# Patient Record
Sex: Female | Born: 1965
Health system: Southern US, Community
[De-identification: ages and names within clinical notes are randomized; demographics above are authoritative.]

## PROBLEM LIST (undated history)

## (undated) DIAGNOSIS — I739 Peripheral vascular disease, unspecified: Secondary | ICD-10-CM

## (undated) DIAGNOSIS — E7800 Pure hypercholesterolemia, unspecified: Secondary | ICD-10-CM

## (undated) DIAGNOSIS — C439 Malignant melanoma of skin, unspecified: Secondary | ICD-10-CM

## (undated) DIAGNOSIS — K219 Gastro-esophageal reflux disease without esophagitis: Secondary | ICD-10-CM

## (undated) DIAGNOSIS — Z9889 Other specified postprocedural states: Secondary | ICD-10-CM

## (undated) DIAGNOSIS — I779 Disorder of arteries and arterioles, unspecified: Secondary | ICD-10-CM

## (undated) DIAGNOSIS — T8859XA Other complications of anesthesia, initial encounter: Secondary | ICD-10-CM

## (undated) DIAGNOSIS — F419 Anxiety disorder, unspecified: Secondary | ICD-10-CM

## (undated) DIAGNOSIS — G43909 Migraine, unspecified, not intractable, without status migrainosus: Secondary | ICD-10-CM

## (undated) DIAGNOSIS — M1990 Unspecified osteoarthritis, unspecified site: Secondary | ICD-10-CM

## (undated) DIAGNOSIS — M797 Fibromyalgia: Secondary | ICD-10-CM

## (undated) DIAGNOSIS — K8020 Calculus of gallbladder without cholecystitis without obstruction: Secondary | ICD-10-CM

## (undated) DIAGNOSIS — R112 Nausea with vomiting, unspecified: Secondary | ICD-10-CM

## (undated) DIAGNOSIS — C229 Malignant neoplasm of liver, not specified as primary or secondary: Secondary | ICD-10-CM

## (undated) DIAGNOSIS — T4145XA Adverse effect of unspecified anesthetic, initial encounter: Secondary | ICD-10-CM

## (undated) DIAGNOSIS — Z720 Tobacco use: Secondary | ICD-10-CM

## (undated) DIAGNOSIS — Z9289 Personal history of other medical treatment: Secondary | ICD-10-CM

## (undated) HISTORY — DX: Migraine, unspecified, not intractable, without status migrainosus: G43.909

## (undated) HISTORY — PX: ABDOMINAL HYSTERECTOMY: SHX81

## (undated) HISTORY — DX: Anxiety disorder, unspecified: F41.9

## (undated) HISTORY — DX: Tobacco use: Z72.0

## (undated) HISTORY — DX: Pure hypercholesterolemia, unspecified: E78.00

## (undated) HISTORY — DX: Fibromyalgia: M79.7

## (undated) HISTORY — DX: Disorder of arteries and arterioles, unspecified: I77.9

## (undated) HISTORY — DX: Peripheral vascular disease, unspecified: I73.9

## (undated) HISTORY — DX: Malignant melanoma of skin, unspecified: C43.9

## (undated) HISTORY — PX: BONE GRAFT HIP ILIAC CREST: SUR159

---

## 1992-01-27 HISTORY — PX: WRIST SURGERY: SHX841

## 1997-11-15 ENCOUNTER — Other Ambulatory Visit: Admission: RE | Admit: 1997-11-15 | Discharge: 1997-11-15 | Payer: Self-pay | Admitting: Obstetrics and Gynecology

## 1999-03-18 ENCOUNTER — Other Ambulatory Visit: Admission: RE | Admit: 1999-03-18 | Discharge: 1999-03-18 | Payer: Self-pay | Admitting: Obstetrics and Gynecology

## 1999-05-22 ENCOUNTER — Ambulatory Visit (HOSPITAL_COMMUNITY): Admission: RE | Admit: 1999-05-22 | Discharge: 1999-05-22 | Payer: Self-pay | Admitting: Obstetrics and Gynecology

## 2001-05-27 ENCOUNTER — Other Ambulatory Visit: Admission: RE | Admit: 2001-05-27 | Discharge: 2001-05-27 | Payer: Self-pay | Admitting: Obstetrics and Gynecology

## 2005-02-05 ENCOUNTER — Ambulatory Visit: Payer: Self-pay | Admitting: Pain Medicine

## 2005-03-26 ENCOUNTER — Ambulatory Visit: Payer: Self-pay | Admitting: Pain Medicine

## 2005-04-15 ENCOUNTER — Ambulatory Visit: Payer: Self-pay | Admitting: Physician Assistant

## 2005-04-30 ENCOUNTER — Ambulatory Visit: Payer: Self-pay | Admitting: Physician Assistant

## 2005-07-06 ENCOUNTER — Ambulatory Visit: Payer: Self-pay | Admitting: Physician Assistant

## 2005-07-23 ENCOUNTER — Ambulatory Visit: Payer: Self-pay | Admitting: Pain Medicine

## 2005-08-05 ENCOUNTER — Ambulatory Visit: Payer: Self-pay | Admitting: Pain Medicine

## 2006-01-26 HISTORY — PX: ANTERIOR FUSION CERVICAL SPINE: SUR626

## 2006-02-01 ENCOUNTER — Inpatient Hospital Stay: Payer: Self-pay | Admitting: Unknown Physician Specialty

## 2008-11-08 ENCOUNTER — Emergency Department (HOSPITAL_COMMUNITY): Admission: EM | Admit: 2008-11-08 | Discharge: 2008-11-08 | Payer: Self-pay | Admitting: Family Medicine

## 2009-10-28 ENCOUNTER — Inpatient Hospital Stay (HOSPITAL_COMMUNITY): Admission: EM | Admit: 2009-10-28 | Discharge: 2009-10-29 | Payer: Self-pay | Admitting: Neurosurgery

## 2009-10-28 ENCOUNTER — Encounter: Payer: Self-pay | Admitting: Emergency Medicine

## 2009-11-14 ENCOUNTER — Ambulatory Visit: Payer: Self-pay | Admitting: Internal Medicine

## 2009-11-14 DIAGNOSIS — M5030 Other cervical disc degeneration, unspecified cervical region: Secondary | ICD-10-CM

## 2009-11-14 DIAGNOSIS — G43909 Migraine, unspecified, not intractable, without status migrainosus: Secondary | ICD-10-CM | POA: Insufficient documentation

## 2009-11-23 ENCOUNTER — Telehealth (INDEPENDENT_AMBULATORY_CARE_PROVIDER_SITE_OTHER): Payer: Self-pay | Admitting: Internal Medicine

## 2009-11-26 ENCOUNTER — Telehealth (INDEPENDENT_AMBULATORY_CARE_PROVIDER_SITE_OTHER): Payer: Self-pay | Admitting: Internal Medicine

## 2009-11-28 ENCOUNTER — Emergency Department (HOSPITAL_COMMUNITY): Admission: EM | Admit: 2009-11-28 | Discharge: 2009-11-29 | Payer: Self-pay | Admitting: Emergency Medicine

## 2009-12-04 ENCOUNTER — Telehealth (INDEPENDENT_AMBULATORY_CARE_PROVIDER_SITE_OTHER): Payer: Self-pay | Admitting: Internal Medicine

## 2009-12-11 ENCOUNTER — Encounter (INDEPENDENT_AMBULATORY_CARE_PROVIDER_SITE_OTHER): Payer: Self-pay | Admitting: Internal Medicine

## 2009-12-13 ENCOUNTER — Encounter (INDEPENDENT_AMBULATORY_CARE_PROVIDER_SITE_OTHER): Payer: Self-pay | Admitting: Internal Medicine

## 2009-12-25 ENCOUNTER — Telehealth (INDEPENDENT_AMBULATORY_CARE_PROVIDER_SITE_OTHER): Payer: Self-pay | Admitting: Internal Medicine

## 2010-01-07 ENCOUNTER — Ambulatory Visit: Payer: Self-pay | Admitting: Internal Medicine

## 2010-01-07 DIAGNOSIS — M7660 Achilles tendinitis, unspecified leg: Secondary | ICD-10-CM

## 2010-01-07 DIAGNOSIS — R300 Dysuria: Secondary | ICD-10-CM | POA: Insufficient documentation

## 2010-01-07 LAB — CONVERTED CEMR LAB
Glucose, Urine, Semiquant: NEGATIVE
Protein, U semiquant: NEGATIVE
Urobilinogen, UA: 0.2

## 2010-01-08 ENCOUNTER — Encounter (INDEPENDENT_AMBULATORY_CARE_PROVIDER_SITE_OTHER): Payer: Self-pay | Admitting: Internal Medicine

## 2010-01-08 ENCOUNTER — Telehealth (INDEPENDENT_AMBULATORY_CARE_PROVIDER_SITE_OTHER): Payer: Self-pay | Admitting: Internal Medicine

## 2010-01-14 ENCOUNTER — Ambulatory Visit: Payer: Self-pay | Admitting: Internal Medicine

## 2010-02-12 ENCOUNTER — Encounter
Admission: RE | Admit: 2010-02-12 | Discharge: 2010-02-25 | Payer: Self-pay | Source: Home / Self Care | Attending: Internal Medicine | Admitting: Internal Medicine

## 2010-02-13 ENCOUNTER — Encounter (INDEPENDENT_AMBULATORY_CARE_PROVIDER_SITE_OTHER): Payer: Self-pay | Admitting: Internal Medicine

## 2010-02-26 ENCOUNTER — Ambulatory Visit: Payer: Self-pay | Attending: Internal Medicine | Admitting: Physical Therapy

## 2010-02-26 DIAGNOSIS — M542 Cervicalgia: Secondary | ICD-10-CM | POA: Insufficient documentation

## 2010-02-26 DIAGNOSIS — M2569 Stiffness of other specified joint, not elsewhere classified: Secondary | ICD-10-CM | POA: Insufficient documentation

## 2010-02-26 DIAGNOSIS — M25676 Stiffness of unspecified foot, not elsewhere classified: Secondary | ICD-10-CM | POA: Insufficient documentation

## 2010-02-26 DIAGNOSIS — M25673 Stiffness of unspecified ankle, not elsewhere classified: Secondary | ICD-10-CM | POA: Insufficient documentation

## 2010-02-26 DIAGNOSIS — IMO0001 Reserved for inherently not codable concepts without codable children: Secondary | ICD-10-CM | POA: Insufficient documentation

## 2010-02-26 DIAGNOSIS — M25579 Pain in unspecified ankle and joints of unspecified foot: Secondary | ICD-10-CM | POA: Insufficient documentation

## 2010-02-27 NOTE — Progress Notes (Signed)
Summary: MED REFILL  Phone Note Refill Request Call back at [phone]   Refills Requested: Medication #1:  PLAVIX 75 MG TABS 1 tab by mouth daily with meal. PLEASE CALL PATIENT HAVE A QUESTION ABOUT WHERE TO REFILLT THE MEDICINE. AT THE HEALTH SERVE OR BY MAIL. SHE HAS APP ON 12.01.11 AT 12:00 WITH DR Sanari Offner.  Initial call taken by: Domenic Polite,  November 26, 2009 11:57 AM  Follow-up for Phone Call        Left message with female for patient to return call. Gaylyn Cheers RN  November 28, 2009 9:15 AM  It was sent to [facility] pharmacy- pt. notified.  Dutch Quint RN  November 29, 2009 3:55 PM

## 2010-02-27 NOTE — Miscellaneous (Signed)
Summary: Rehab Report//INITIAL SUMMARY  Rehab Report//INITIAL SUMMARY   Imported By: Arta Bruce 02/14/2010 16:55:54  _____________________________________________________________________  External Attachment:    Type:   Image     Comment:   External Document

## 2010-02-27 NOTE — Progress Notes (Signed)
Summary: Rescheduled Appt***  Phone Note Outgoing Call   Call placed by: Hassell Halim CMA,  December 25, 2009 5:17 PM Call placed to: Patient Action Taken: Appt scheduled Summary of Call: Called pt to reschedule appt with Dr Delrae Alfred..patient states she was coming in to follow up with meds and that she needs a refill on her tramadol and promethezine. Advise will forward to provider to review chart and make a decision on the refill. New appt is 01/07/2010 Initial call taken by: Hassell Halim CMA,  December 25, 2009 5:18 PM  Follow-up for Phone Call        OK to fill both for a month--please call Tramadol to [facility].  I have sent the promethazine. Follow-up by: Julieanne Manson MD,  December 26, 2009 1:42 PM  Additional Follow-up for Phone Call Additional follow up Details #1::        called rx into pharmacy and pt is aware of med being at pharmacy Additional Follow-up by: Armenia Shannon,  December 26, 2009 2:56 PM    Prescriptions: TRAMADOL HCL 50 MG TABS (TRAMADOL HCL) 2 tabs by mouth every 6 hours as needed  #30 x 0   Entered and Authorized by:   Julieanne Manson MD   Signed by:   Julieanne Manson MD on 12/26/2009   Method used:   Telephoned to ...       [facility] (retail)       [address] [place], [place]  [address]       Ph: [phone]       Fax: [phone]   RxID:   [id number] PROMETHAZINE HCL 25 MG TABS (PROMETHAZINE HCL) 1/2 to 1 tab by mouth every 6 hours prn  #30 x 0   Entered and Authorized by:   Julieanne Manson MD   Signed by:   Julieanne Manson MD on 12/26/2009   Method used:   Faxed to ...       [facility] - [facility] (retail)       [address] [place], [place]  [address]       Ph: [phone]       Fax: [phone]   RxID:   [id number]

## 2010-02-27 NOTE — Miscellaneous (Signed)
Summary: NS at [facility]--med change  Clinical Lists Changes  Medications: Added new medication of NORTRIPTYLINE HCL 25 MG CAPS (NORTRIPTYLINE HCL) 1 tab by mouth at bedtime--NS at [facility]  Dr. Gaynelle Adu

## 2010-02-27 NOTE — Letter (Signed)
[facility]   Imported By: Arta Bruce 12/17/2009 09:50:41  _____________________________________________________________________  External Attachment:    Type:   Image     Comment:   External Document

## 2010-02-27 NOTE — Letter (Signed)
Summary: PT INFORMATION SHEET  PT INFORMATION SHEET   Imported By: Arta Bruce 11/14/2009 14:21:08  _____________________________________________________________________  External Attachment:    Type:   Image     Comment:   External Document

## 2010-02-27 NOTE — Progress Notes (Signed)
Summary: meds refill  Phone Note Refill Request Call back at [phone]   Refills Requested: Medication #1:  VERAPAMIL HCL CR 120 MG XR24H-CAP 1 cap by mouth daily. they only give her 3 pills..patient wants to change pharmacy to [facility] phone [phone] [address]  Initial call taken by: Domenic Polite,  January 08, 2010 11:51 AM  Follow-up for Phone Call        Left message on answering machine for pt. to return call.  Dutch Quint RN  January 08, 2010 2:37 PM  Pt. states only received 3 pills with no explanation.  I called pharmacy -- they only had 3 to give her until new supply comes in tomorrow.  Advised pt. -- she will keep Rx at [facility] and call them tomorrow to check for refill availability. Follow-up by: Dutch Quint RN,  January 08, 2010 4:56 PM

## 2010-02-27 NOTE — Progress Notes (Signed)
Summary: Re neuro referral  Phone Note Call from Patient   Caller: Patient Summary of Call: Pt. called here, requesting to go to [facility] Neurology unable to afford to go back to Dr. Pearlean Brownie. Initial call taken by: Gaylyn Cheers RN,  December 04, 2009 9:04 AM  Follow-up for Phone Call        Nora--please redirect Neuro referral to [facility] to have them determine what her follow up should be.   Please also include D/C summary  and Brain images from EChart--if you need me to copy and give to you tomorrow, let me know.  Send my recent note as well. Follow-up by: Julieanne Manson MD,  December 04, 2009 1:07 PM  Additional Follow-up for Phone Call Additional follow up Details #1::        I send the referral to [facility] Neurology they going to review it and let me know . I print the MR Brain from another computer because my computer don't print from e-chart  Additional Follow-up by: Cheryll Dessert,  December 04, 2009 1:34 PM

## 2010-02-27 NOTE — Progress Notes (Signed)
Summary: neurology referral  Phone Note Outgoing Call   Summary of Call: Laura [name]--referral to Dr. Jolayne Panther up of hospitalization for left vertebral artery dissection. Initial call taken by: Julieanne Manson MD,  November 23, 2009 9:34 AM  Follow-up for Phone Call        I SEND THE REFERRAL TO [facility] WITH THE OV NOTES DR Pearlean Brownie WAITING FOR AN APPT  Follow-up by: Cheryll Dessert,  November 25, 2009 9:09 AM

## 2010-02-27 NOTE — Assessment & Plan Note (Signed)
Summary: BP and pulse check  Nurse Visit   Vital Signs:  Patient profile:   45 year old female Menstrual status:  hysterectomy Pulse rate:   100 / minute Pulse rhythm:   regular Resp:     16 per minute BP sitting:   104 / 80  (right arm) Cuff size:   regular  Vitals Entered By: Dutch Quint RN (January 14, 2010 3:13 PM)  Impression & Recommendations:  Problem # 1:  MIGRAINE HEADACHE (ICD-346.90) Blood pressure remains good To continue verapamil  Keep scheduled appointment with Mehgan Santmyer   Her updated medication list for this problem includes:    Tramadol Hcl 50 Mg Tabs (Tramadol hcl) .Marland Kitchen... 2 tabs by mouth every 6 hours as needed  Orders: Est. Patient Level I (81191)  Complete Medication List: 1)  Promethazine Hcl 25 Mg Tabs (Promethazine hcl) .... 1/2 to 1 tab by mouth every 6 hours prn 2)  Tramadol Hcl 50 Mg Tabs (Tramadol hcl) .... 2 tabs by mouth every 6 hours as needed 3)  Plavix 75 Mg Tabs (Clopidogrel bisulfate) .Marland Kitchen.. 1 tab by mouth daily with meal 4)  Verapamil Hcl Cr 120 Mg Xr24h-cap (Verapamil hcl) .Marland Kitchen.. 1 cap by mouth daily. 5)  Cipro 250 Mg Tabs (Ciprofloxacin hcl) .Marland Kitchen.. 1 tab by mouth two times a day for 3 days   Patient Instructions: 1)  Reviewed with Dr. Delrae Alfred 2)  Your blood pressure is fine -- no change. 3)  Continue with the verapamil.  It may take a while for you to notice a significant change. 4)  Keep scheduled appointment with Katrice Goel 5)  Call if anything changes or if you have any questions.   Physical Exam  General:  alert, well-developed, well-nourished, and well-hydrated.    CC: BP and pulse recheck  Is Patient Diabetic? No Pain Assessment Patient in pain? yes     Location: head Intensity: 6 Type: throbbing Onset of pain  Constant  Does patient need assistance? Functional Status Self care Ambulation Normal   Review of Systems CV:  Having trouble sleeping since starting medication.  States no change in headaches..   CC:   BP and pulse recheck .  History of Present Illness: Started on verapamil 120 mg. on 01/07/10.  States continues to have headaches, no change.  Continuing antibiotic.   Allergies: 1)  ! Aspirin (Aspirin)  Orders Added: 1)  Est. Patient Level I [47829]

## 2010-02-27 NOTE — Assessment & Plan Note (Signed)
Summary: F/U VISIT FOR MEDS//MC   Vital Signs:  Patient profile:   45 year old female Menstrual status:  hysterectomy Weight:      131.44 pounds BMI:     23.37 Temp:     98.1 degrees F oral Pulse rate:   108 / minute Pulse rhythm:   regular Resp:     14 per minute BP sitting:   102 / 80  (left arm) Cuff size:   regular  Vitals Entered By: Hale Drone CMA (January 07, 2010 3:00 PM) CC: 1 month f/u from 11/14/09. Has a knot on right foot near the heel.  Is Patient Diabetic? No Pain Assessment Patient in pain? no       Does patient need assistance? Functional Status Self care Ambulation Normal   CC:  1 month f/u from 11/14/09. Has a knot on right foot near the heel. Marland Kitchen  History of Present Illness: 1.  Vertebral Dissection:  Did see Neurology at [facility].  To stay on Plavix.  2.  Migraines:  was started on Nortriptyline to cover both prevention of migraines and insomnia.  She has not noted an iimprovement in either.  No side effects.  Pt. also with what sounds like a muscle spasm component with hx of cervical spine disease--chronic neck and shoulder pain  3.  Insomnia:  Nortriptyline without help regarding this also.  4.  Dysuria with hematuria in past 2 days.  No back pain or fever.  Drinking fluids okay.  No vomiting.  5.  Tender knot on posterior right heel.  No history of injury.  Does not wear any boots or shoes that rub up and down over this area.  Current Medications (verified): 1)  Promethazine Hcl 25 Mg Tabs (Promethazine Hcl) .... 1/2 To 1 Tab By Mouth Every 6 Hours Prn 2)  Tramadol Hcl 50 Mg Tabs (Tramadol Hcl) .... 2 Tabs By Mouth Every 6 Hours As Needed 3)  Plavix 75 Mg Tabs (Clopidogrel Bisulfate) .Marland Kitchen.. 1 Tab By Mouth Daily With Meal 4)  Nortriptyline Hcl 25 Mg Caps (Nortriptyline Hcl) .Marland Kitchen.. 1 Tab By Mouth At Bedtime--Ns At [facility]  Dr. Gaynelle Adu  Allergies (verified): 1)  ! Aspirin (Aspirin)  Physical Exam  General:  NAD Eyes:  PERRL, EOMI, discs  sharp Lungs:  Normal respiratory effort, chest expands symmetrically. Lungs are clear to auscultation, no crackles or wheezes. Heart:  Normal rate and regular rhythm. S1 and S2 normal without gallop, murmur, click, rub or other extra sounds. Extremities:  Tender swollen area without erythema at insertion of right achilles tendon to back of calcaneus.  Full ROM of ankle, but uncomfortable   Impression & Recommendations:  Problem # 1:  ACHILLES TENDINITIS (ICD-726.71)  Orders: Physical Therapy Referral (PT)  Problem # 2:  DYSURIA (ICD-788.1) Probable cystitis Her updated medication list for this problem includes:    Cipro 250 Mg Tabs (Ciprofloxacin hcl) .Marland Kitchen... 1 tab by mouth two times a day for 3 days  Orders: UA Dipstick w/o Micro (manual) (04540) T-Culture, Urine ([id number])  Problem # 3:  MIGRAINE HEADACHE (ICD-346.90) Start Verapamil for prophylaxis. Want to avoid meds that can cause vascular constriction with hx of vertebral artery dissection. Her updated medication list for this problem includes:    Tramadol Hcl 50 Mg Tabs (Tramadol hcl) .Marland Kitchen... 2 tabs by mouth every 6 hours as needed  Problem # 4:  Hx of DISC DISEASE, CERVICAL (ICD-722.4) No interest in PT for this currently--will see if can get PT to work with  her regarding this while in for achilles tendon  Complete Medication List: 1)  Promethazine Hcl 25 Mg Tabs (Promethazine hcl) .... 1/2 to 1 tab by mouth every 6 hours prn 2)  Tramadol Hcl 50 Mg Tabs (Tramadol hcl) .... 2 tabs by mouth every 6 hours as needed 3)  Plavix 75 Mg Tabs (Clopidogrel bisulfate) .Marland Kitchen.. 1 tab by mouth daily with meal 4)  Verapamil Hcl Cr 120 Mg Xr24h-cap (Verapamil hcl) .Marland Kitchen.. 1 cap by mouth daily. 5)  Cipro 250 Mg Tabs (Ciprofloxacin hcl) .Marland Kitchen.. 1 tab by mouth two times a day for 3 days  Patient Instructions: 1)  nurse visit for bp and pulse check in 1 week. 2)  Follow up with Dr. Delrae Alfred in 2 months --migraines Prescriptions: CIPRO 250 MG TABS  (CIPROFLOXACIN HCL) 1 tab by mouth two times a day for 3 days  #6 x 0   Entered and Authorized by:   Julieanne Manson MD   Signed by:   Julieanne Manson MD on 01/07/2010   Method used:   Electronically to        [facility] [address] (retail)       [phone]       [place], [place]  [address]       Ph: [phone]       Fax: [phone]   RxID:   [id number] VERAPAMIL HCL CR 120 MG XR24H-CAP (VERAPAMIL HCL) 1 cap by mouth daily.  #30 x 6   Entered and Authorized by:   Julieanne Manson MD   Signed by:   Julieanne Manson MD on 01/07/2010   Method used:   Electronically to        [facility] [address] (retail)       [phone]       [place], [place]  [address]       Ph: [phone]       Fax: [phone]   RxID:   [id number]    Orders Added: 1)  UA Dipstick w/o Micro (manual) [81002] 2)  Physical Therapy Referral [PT] 3)  T-Culture, Urine [[id number]] 4)  Est. Patient Level IV [20254]   Not Administered:    Influenza Vaccine not given due to: declined    Laboratory Results   Urine Tests  Date/Time Received: January 07, 2010 4:27 PM   Routine Urinalysis   Color: lt. yellow Glucose: negative   (Normal Range: Negative) Bilirubin: negative   (Normal Range: Negative) Ketone: negative   (Normal Range: Negative) Spec. Gravity: 1.010   (Normal Range: 1.003-1.035) Blood: negative   (Normal Range: Negative) pH: 5.5   (Normal Range: 5.0-8.0) Protein: negative   (Normal Range: Negative) Urobilinogen: 0.2   (Normal Range: 0-1) Nitrite: negative   (Normal Range: Negative) Leukocyte Esterace: small   (Normal Range: Negative)        Appended Document: F/U VISIT FOR MEDS//MC consider Neurontin for neck in future if no improvement.

## 2010-02-27 NOTE — Assessment & Plan Note (Signed)
Summary: NEW/IXFU//STROKE//KT   Vital Signs:  Patient profile:   45 year old female Menstrual status:  hysterectomy Height:      63 inches Weight:      133.2 pounds Temp:     97.4 degrees F Resp:     16 per minute BP sitting:   98 / 62  (left arm) Cuff size:   regular  Vitals Entered By: Michelle Nasuti (November 14, 2009 11:24 AM) CC: HFU stroke Is Patient Diabetic? No Pain Assessment Patient in pain? yes     Location: head Intensity: 6  Does patient need assistance? Functional Status Self care Ambulation Normal     Menstrual Status hysterectomy   CC:  HFU stroke.  History of Present Illness: 45 yo female here with history of  left vertebral artery dissection for which she was hospitalized overnight at [facility].  No cause for dissection found--possibility of fibromuscular dysplasia.  To follow up with Dr. Pearlean Brownie.  Still with headaches at occipital area and sometime radiates straight forward to left eye.  Sometimes with numbness and tingling into left fingers.  Symptoms not as bad as when hospitalized.  Had symptoms for 2 days before going into ED.  Continues on Plavix--has 10 days left.  Does feel bilateral vision goes blurry at times--not often.    PMH:  1.  Migraine Headaches:  started when very young.  Though pt. describes headache at nuchal area, then radiates up and over head.  Sometimes squeezing, becomes throbbing.  No aura.  Sometimes has photophobia, sometimes phonophobia, sense of smell intesifies, sometimes has nausea and vomiting.  Father and sister with same diagnosis.  2.  Cspine disease--underwent surgery with Dr. Gerrit Heck in [place] was ortho --2008 C4-5 or C5-6 fusion.  did help pain    Current Medications (verified): 1)  Promethazine Hcl 25 Mg Tabs (Promethazine Hcl) .... 1/2 To 1 Tab By Mouth Every 6 Hours Prn 2)  Tramadol Hcl 50 Mg Tabs (Tramadol Hcl) .... 2 Tabs By Mouth Every 6 Hours As Needed 3)  Plavix 75 Mg Tabs (Clopidogrel Bisulfate) .Marland Kitchen..  1 Tab By Mouth Daily With Meal  Allergies (verified): 1)  ! Aspirin (Aspirin)  Past History:  Past Surgical History: 1.  2008:  Cspine fusion (C5-6)? 2.  1994:  Vaginal hysterectomy with complication of hemorrhage requiring open pelvicsurgery the next day 3.  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 Tobacco Use:  Quit age 64.  Started age 89.  Smoked 2ppd when smoked Alcohol:  no Drug:  never.   Physical Exam  General:  Well-developed,well-nourished,in no acute distress; alert,appropriate and cooperative throughout examination Eyes:  No corneal or conjunctival inflammation noted. EOMI. Perrla. Funduscopic exam benign, without hemorrhages, exudates or papilledema. Vision grossly normal. Ears:  External ear exam shows no significant lesions or deformities.  Otoscopic examination reveals clear canals, tympanic membranes are intact bilaterally without bulging, retraction, inflammation or discharge. Hearing is grossly normal bilaterally. Mouth:  pharynx pink and moist.   Lungs:  Normal respiratory effort, chest expands symmetrically. Lungs are clear to auscultation, no crackles or wheezes. Heart:  Normal rate and regular rhythm. S1 and S2 normal without gallop, murmur, click, rub or other extra sounds. Neurologic:  alert & oriented X3, cranial nerves II-XII intact, strength normal in all extremities, gait normal, DTRs symmetrical and normal, finger-to-nose normal, and Romberg negative.     Impression & Recommendations:  Problem # 1:  DISSECTION OF LEFT VERTEBRAL ARTERY (ICD-443.24) No new symptoms--make sure gets follow up with Dr. Pearlean Brownie Orders: Neurology Referral (Neuro)  Problem # 2:   MIGRAINE HEADACHE (ICD-346.90) Promethazine if nausea. Her updated medication list for this problem includes:    Tramadol Hcl 50 Mg Tabs (Tramadol hcl) .Marland Kitchen... 2 tabs by mouth every 6 hours as needed  Complete Medication List: 1)  Promethazine Hcl 25 Mg Tabs (Promethazine hcl) .... 1/2 to 1 tab by mouth every 6 hours prn 2)  Tramadol Hcl 50 Mg Tabs (Tramadol hcl) .... 2 tabs by mouth every 6 hours as needed 3)  Plavix 75 Mg Tabs (Clopidogrel bisulfate) .Marland Kitchen.. 1 tab by mouth daily with meal  Patient Instructions: 1)  Schedule eligibility appt. for orange card 2)  Appt. with Dr. Delrae Alfred for follow up within a month after eligibility appt. 3)  Go to ED if worsening symptoms. Prescriptions: TRAMADOL HCL 50 MG TABS (TRAMADOL HCL) 2 tabs by mouth every 6 hours as needed  #30 x 0   Entered and Authorized by:   Julieanne Manson MD   Signed by:   Julieanne Manson MD on 11/14/2009   Method used:   Print then Give to Patient   RxID:   [id number] PLAVIX 75 MG TABS (CLOPIDOGREL BISULFATE) 1 tab by mouth daily with meal  #30 x 3   Entered and Authorized by:   Julieanne Manson MD   Signed by:   Julieanne Manson MD on 11/14/2009   Method used:   Faxed to ...       [facility] (retail)       [address] [place], [place]  [address]       Ph: [phone]       Fax: [phone]   RxID:   [id number] PROMETHAZINE HCL 25 MG TABS (PROMETHAZINE HCL) 1/2 to 1 tab by mouth every 6 hours prn  #30 x 0   Entered and Authorized by:   Julieanne Manson MD   Signed by:   Julieanne Manson MD on 11/14/2009   Method used:   Faxed to ...       [facility] (retail)       [address] [place], [place]  [address]       Ph: [phone]       Fax: [phone]   RxID:   [id number]    Orders Added: 1)  Neurology Referral [Neuro] 2)  Est. Patient Level II [32202]

## 2010-03-03 ENCOUNTER — Encounter: Payer: Self-pay | Admitting: Physical Therapy

## 2010-03-04 ENCOUNTER — Telehealth (INDEPENDENT_AMBULATORY_CARE_PROVIDER_SITE_OTHER): Payer: Self-pay | Admitting: Internal Medicine

## 2010-03-05 ENCOUNTER — Encounter: Payer: Self-pay | Admitting: Physical Therapy

## 2010-03-10 ENCOUNTER — Ambulatory Visit: Payer: Self-pay | Admitting: Physical Therapy

## 2010-03-10 ENCOUNTER — Encounter (INDEPENDENT_AMBULATORY_CARE_PROVIDER_SITE_OTHER): Payer: Self-pay | Admitting: Internal Medicine

## 2010-03-11 ENCOUNTER — Encounter (INDEPENDENT_AMBULATORY_CARE_PROVIDER_SITE_OTHER): Payer: Self-pay | Admitting: Internal Medicine

## 2010-03-11 ENCOUNTER — Encounter: Payer: Self-pay | Admitting: Internal Medicine

## 2010-03-12 ENCOUNTER — Encounter (INDEPENDENT_AMBULATORY_CARE_PROVIDER_SITE_OTHER): Payer: Self-pay | Admitting: Internal Medicine

## 2010-03-12 ENCOUNTER — Ambulatory Visit: Payer: Self-pay | Admitting: Physical Therapy

## 2010-03-17 ENCOUNTER — Encounter: Payer: Self-pay | Admitting: Physical Therapy

## 2010-03-19 NOTE — Assessment & Plan Note (Signed)
Summary: Migrane f/u    Vital Signs:  Patient profile:   45 year old female Menstrual status:  hysterectomy Weight:      136.56 pounds Temp:     97.1 degrees F oral Pulse rate:   90 / minute Pulse rhythm:   regular Resp:     18 per minute BP sitting:   122 / 90  (left arm) Cuff size:   regular  Vitals Entered By: Hale Drone CMA (March 11, 2010 2:26 PM) CC: f/u on migranes. Still the same. Having a migrane now x1 week. Has not been able to p/u any meds. Pt. called yesterday and have adv. her that her meds are ready. Urgency to urinate but can't void x1 week. Has noticed a slight odor to her urine. Denies burning.  Is Patient Diabetic? No Pain Assessment Patient in pain? yes     Location: head Intensity: 8 Type: aching Onset of pain  Constant  Does patient need assistance? Functional Status Self care Ambulation Normal   CC:  f/u on migranes. Still the same. Having a migrane now x1 week. Has not been able to p/u any meds. Pt. called yesterday and have adv. her that her meds are ready. Urgency to urinate but can't void x1 week. Has noticed a slight odor to her urine. Denies burning. Marland Kitchen  History of Present Illness: 1.  Migraines:  pt. now states she has not been taking the Verapamil since 12.20.11 as she felt it made her jittery and wide awake.  She also felt it caused her heart to race.  Pt. has never taken a Beta blocker before.  No hx of asthma.   Has started with PT for the tension headache component.  2.  Vertebral artery dissection:  has been off Plavix for 1 week as for some reason, could not get filled.  INterestingly, we did get the request for refill of the other meds.  3.  Achilles Tendinitis:  has been working with PT and pain is almost gone.  Now working on exercises to decrease  recurrence.    4.  Urine complaints as above.  No vaginal discharge.  Urine cloudy and with odor.    UA today is normal.  No fever.  Not clear if pt. having flank pain--points to several  areas of back pain.  Has had symptoms for about 1 week,.  Was treated for a UTI. Treated herself for yeast infection subsequently and symptoms resolved.    Current Medications (verified): 1)  Promethazine Hcl 25 Mg Tabs (Promethazine Hcl) .... 1/2 To 1 Tab By Mouth Every 6 Hours Prn 2)  Tramadol Hcl 50 Mg Tabs (Tramadol Hcl) .... 2 Tabs By Mouth Every 6 Hours As Needed 3)  Plavix 75 Mg Tabs (Clopidogrel Bisulfate) .Marland Kitchen.. 1 Tab By Mouth Daily With Meal 4)  Verapamil Hcl Cr 120 Mg Xr24h-Cap (Verapamil Hcl) .Marland Kitchen.. 1 Cap By Mouth Daily. 5)  Cipro 250 Mg Tabs (Ciprofloxacin Hcl) .Marland Kitchen.. 1 Tab By Mouth Two Times A Day For 3 Days  Allergies (verified): 1)  ! Aspirin (Aspirin)  Physical Exam  General:  NAD Lungs:  Normal respiratory effort, chest expands symmetrically. Lungs are clear to auscultation, no crackles or wheezes. Heart:  Normal rate and regular rhythm. S1 and S2 normal without gallop, murmur, click, rub or other extra sounds.  Radial pulses normal and equal Extremities:  NT on pressure over posterior right heel, though insertion of achilles tendon still prominent.   Impression & Recommendations:  Problem #  1:  DYSURIA (ICD-788.1) Pt. describes significant symptoms, despite and normal UA--send for culture to clarify Denies any vaginal symptoms. Orders: UA Dipstick w/o Micro (automated)  (81003) T-Culture, Urine ([id number])  Problem # 2:  ACHILLES TENDINITIS (ICD-726.71) Improved already with PT  Problem # 3:  MIGRAINE HEADACHE (ICD-346.90) Did not tolerate Verapamil Not clear why we are getting different info from when pt. last here for bp check regarding the Verapamil, but will not restart and will try Toprol instead. Her updated medication list for this problem includes:    Tramadol Hcl 50 Mg Tabs (Tramadol hcl) .Marland Kitchen... 2 tabs by mouth every 6 hours as needed    Toprol Xl 25 Mg Xr24h-tab (Metoprolol succinate) .Marland Kitchen... 1 tab by mouth daily  Problem # 4:  Hx of DISC DISEASE,  CERVICAL (ICD-722.4) Feel this is leading to muscular tension headache pain as well--continue with PT  Complete Medication List: 1)  Promethazine Hcl 25 Mg Tabs (Promethazine hcl) .... 1/2 to 1 tab by mouth every 6 hours prn 2)  Tramadol Hcl 50 Mg Tabs (Tramadol hcl) .... 2 tabs by mouth every 6 hours as needed 3)  Plavix 75 Mg Tabs (Clopidogrel bisulfate) .Marland Kitchen.. 1 tab by mouth daily with meal 4)  Toprol Xl 25 Mg Xr24h-tab (Metoprolol succinate) .Marland Kitchen.. 1 tab by mouth daily  Patient Instructions: 1)  Call if upper number of blood pressure is below 100 and you feel really tired or light headed.   2)  Call if heart rate generally below 60 at rest. 3)  Call in or bring in bps and pulses for the next 1 week after starting 4)  Follow up with Dr. Delrae Alfred in 8  weeks 5)    Prescriptions: TOPROL XL 25 MG XR24H-TAB (METOPROLOL SUCCINATE) 1 tab by mouth daily  #30 x 4   Entered and Authorized by:   Julieanne Manson MD   Signed by:   Julieanne Manson MD on 03/11/2010   Method used:   Faxed to ...       [facility] (retail)       [address] [place], [place]  [address]       Ph: [phone]       Fax: [phone]   RxID:   [id number] PLAVIX 75 MG TABS (CLOPIDOGREL BISULFATE) 1 tab by mouth daily with meal  #30 x 3   Entered and Authorized by:   Julieanne Manson MD   Signed by:   Julieanne Manson MD on 03/11/2010   Method used:   Faxed to ...       [facility] (retail)       [address] [place], [place]  [address]       Ph: [phone]       Fax: [phone]   RxID:   [id number] INDERAL LA 60 MG XR24H-CAP (PROPRANOLOL HCL) 1 tab by mouth daily  #30 x 6   Entered and Authorized by:   Julieanne Manson MD   Signed by:   Julieanne Manson MD on 03/11/2010   Method used:   Faxed to ...       [facility] (retail)       [address] [place], [place]  [address]       Ph: [phone]       Fax: [phone]   RxID:  [id number]    Orders Added: 1)  UA Dipstick w/o Micro (automated)  [81003] 2)  Est. Patient Level IV [[address]] 3)  T-Culture, Urine [[id number]]   Not Administered:    Influenza Vaccine not given due to: declined    Appended Document: UA results     Allergies: 1)  ! Aspirin (Aspirin)   Complete Medication List: 1)  Promethazine Hcl 25 Mg Tabs (Promethazine hcl) .... 1/2 to 1 tab by mouth every 6 hours prn 2)  Tramadol Hcl 50 Mg Tabs (Tramadol hcl) .... 2 tabs by mouth every 6 hours as needed 3)  Plavix 75 Mg Tabs (Clopidogrel bisulfate) .Marland Kitchen.. 1 tab by mouth daily with meal 4)  Toprol Xl 25 Mg Xr24h-tab (Metoprolol succinate) .Marland Kitchen.. 1 tab by mouth daily      Laboratory Results   Urine Tests  Date/Time Received: March 11, 2010 3:43 PM   Routine Urinalysis   Color: lt. yellow Appearance: Clear Glucose: negative   (Normal Range: Negative) Bilirubin: negative   (Normal Range: Negative) Ketone: negative   (Normal Range: Negative) Spec. Gravity: <1.005   (Normal Range: 1.003-1.035) Blood: negative   (Normal Range: Negative) pH: 6.0   (Normal Range: 5.0-8.0) Protein: negative   (Normal Range: Negative) Urobilinogen: 0.2   (Normal Range: 0-1) Nitrite: negative   (Normal Range: Negative) Leukocyte Esterace: negative   (Normal Range: Negative)

## 2010-03-19 NOTE — Miscellaneous (Signed)
Summary: Rehab Report//PROGRESS NOTE  Rehab Report//PROGRESS NOTE   Imported By: Arta Bruce 03/12/2010 12:09:26  _____________________________________________________________________  External Attachment:    Type:   Image     Comment:   External Document

## 2010-03-19 NOTE — Miscellaneous (Signed)
Summary: OUTPT REHABILITION //PROGRESS NOTE  OUTPT REHABILITION //PROGRESS NOTE   Imported By: Arta Bruce 03/11/2010 14:52:23  _____________________________________________________________________  External Attachment:    Type:   Image     Comment:   External Document

## 2010-03-19 NOTE — Progress Notes (Signed)
Summary: REQUESTING REFILLS  Phone Note Call from Patient Call back at Home Phone [phone]   Reason for Call: Refill Medication Summary of Call: Laura [name] called and says taht she needs a refill on her promethatzine and tramadol and she spoke w[facility] and was told by them they sent the request over 3 times. Initial call taken by: Leodis Rains,  March 04, 2010 3:23 PM  Follow-up for Phone Call        refills sent to [facility] pharmacy notify pt Follow-up by: Lehman Prom FNP,  March 05, 2010 7:49 AM  Additional Follow-up for Phone Call Additional follow up Details #1::        Called and spoke w/pt. -- gave her the above info. -- Hale Drone CMA  March 10, 2010 1:31 PM     Prescriptions: TRAMADOL HCL 50 MG TABS (TRAMADOL HCL) 2 tabs by mouth every 6 hours as needed  #30 x 0   Entered and Authorized by:   Lehman Prom FNP   Signed by:   Lehman Prom FNP on 03/05/2010   Method used:   Faxed to ...       [facility] (retail)       [address] [place], [place]  [address]       Ph: [phone]       Fax: [phone]   RxID:   [id number] PROMETHAZINE HCL 25 MG TABS (PROMETHAZINE HCL) 1/2 to 1 tab by mouth every 6 hours prn  #30 x 0   Entered and Authorized by:   Lehman Prom FNP   Signed by:   Lehman Prom FNP on 03/05/2010   Method used:   Faxed to ...       [facility] (retail)       [address] [place], [place]  [address]       Ph: [phone]       Fax: [phone]   RxID:   [id number]

## 2010-03-24 ENCOUNTER — Ambulatory Visit: Payer: Self-pay | Admitting: Physical Therapy

## 2010-03-26 ENCOUNTER — Ambulatory Visit: Payer: Self-pay | Admitting: Physical Therapy

## 2010-04-09 LAB — DIFFERENTIAL
Basophils Relative: 0 % (ref 0–1)
Eosinophils Absolute: 0 10*3/uL (ref 0.0–0.7)
Neutrophils Relative %: 93 % — ABNORMAL HIGH (ref 43–77)

## 2010-04-09 LAB — CBC
MCH: 33.1 pg (ref 26.0–34.0)
MCHC: 35.3 g/dL (ref 30.0–36.0)
Platelets: 369 10*3/uL (ref 150–400)

## 2010-04-09 LAB — BASIC METABOLIC PANEL
BUN: 6 mg/dL (ref 6–23)
CO2: 28 mEq/L (ref 19–32)
Calcium: 9.2 mg/dL (ref 8.4–10.5)
Creatinine, Ser: 0.62 mg/dL (ref 0.4–1.2)
Glucose, Bld: 126 mg/dL — ABNORMAL HIGH (ref 70–99)

## 2010-04-09 LAB — PROTIME-INR
INR: 1.06 (ref 0.00–1.49)
Prothrombin Time: 14 seconds (ref 11.6–15.2)

## 2010-04-10 LAB — CBC
HCT: 33.8 % — ABNORMAL LOW (ref 36.0–46.0)
Hemoglobin: 11.7 g/dL — ABNORMAL LOW (ref 12.0–15.0)
MCHC: 34.6 g/dL (ref 30.0–36.0)
RBC: 3.72 MIL/uL — ABNORMAL LOW (ref 3.87–5.11)

## 2010-04-10 LAB — LIPID PANEL
Cholesterol: 184 mg/dL (ref 0–200)
LDL Cholesterol: 122 mg/dL — ABNORMAL HIGH (ref 0–99)
Triglycerides: 110 mg/dL (ref <150)
VLDL: 22 mg/dL (ref 0–40)

## 2010-04-10 LAB — HEMOGLOBIN A1C: Mean Plasma Glucose: 82 mg/dL (ref <117)

## 2010-04-10 LAB — HEPARIN LEVEL (UNFRACTIONATED): Heparin Unfractionated: 0.43 IU/mL (ref 0.30–0.70)

## 2010-05-28 ENCOUNTER — Ambulatory Visit (INDEPENDENT_AMBULATORY_CARE_PROVIDER_SITE_OTHER): Payer: Self-pay | Admitting: Family Medicine

## 2010-05-28 ENCOUNTER — Encounter: Payer: Self-pay | Admitting: Family Medicine

## 2010-05-28 VITALS — BP 109/76 | Ht 62.0 in | Wt 135.0 lb

## 2010-05-28 DIAGNOSIS — M79609 Pain in unspecified limb: Secondary | ICD-10-CM

## 2010-05-28 DIAGNOSIS — M79673 Pain in unspecified foot: Secondary | ICD-10-CM

## 2010-05-28 NOTE — Progress Notes (Signed)
  Subjective:    Patient ID: Laura [name], female    DOB: Jun 10, 1965, 45 y.o.   MRN: [id number]  HPI 45 year old female referred by Julieanne Manson for evaluation and treatment of right posterior heel and Achilles pain and nodule. Patient states he's had some issues with her right posterior heel for about 7-8 months since October of 2011. She's also had a lump on her posterior heel. She has gone to therapy, but really seen little to no results. She has done lots of stretches and takes some medicine but not seeing any results. She will occasionally have some Achilles tendon pain, medial ankle pain, heel pain her plantar fascia, and arch pain. However today she really states she has no pain at all. Her main concern is the large lump in the posterior aspect of her heel that she would like to go away can she has not think it's overly anesthetic.   Review of Systems Denies fever, sweats, chills, weight loss    Objective:   Physical Exam General appearance well appearing female no distress Neck supple Eyes extraocular muscles are intact Psych normal affect Neuro alert and oriented ENT moist mucous membranes Lungs no labored breathing Abdomen soft Right foot: There is a 1-1.5 cm palpable soft mobile nodule on the lateral aspect of the distal Achilles tendon on the right side. It is not overly red or warm or inflamed. She has no tenderness along the Achilles tendon or any gaps. She has minimal tenderness at the plantar fascial origin on her heel. She has minimal tenderness with squeezing of the retrocalcaneal bursa. Ankle shows good range of motion and strength. She otherwise has fairly good foot structure.  Muscle skeletal ultrasound of the right foot and ankle: Achilles tendon is intact with normal width at 0.34 cm and no evidence of edema or tears a longitudinal transverse axes. On longitudinal and transverse view on the lateral aspect of Achilles tendon there is an extra tendinous cystic  structure that is fairly homogenous in appearance that is about 0.29 x 0.3 x 1.68 cm. This cystic structure does not have any Doppler flow. Plantar fascia is normal width of 0.29 cm with no evidence of tearing.  Procedure: Written consent. Sterilized with alcohol pad and sterile gel. Under ultrasound guidance, I used a 30-gauge 5/8 inch needle inserted into the cystic structure described above and injected a combination of 0.4 cc of Kenalog 10 mg and 0.4 cc of 1% lidocaine. Patient tolerated procedure well.       Assessment & Plan:  Right posterior heel pain, with extra tendinous cyst just posterior and lateral to the Achilles tendon. This does not appear to be a classic Haglund's deformity. At this time I am not really sure that this is a ganglion versus some other structure. However I have low suspicion that is anything particularly malignant. -We injected this today, see procedure note above -We also gave her 5/16 inch heel lifts to place in her shoes. -She'll followup with me in 2 weeks

## 2010-06-11 ENCOUNTER — Ambulatory Visit (INDEPENDENT_AMBULATORY_CARE_PROVIDER_SITE_OTHER): Payer: Self-pay | Admitting: Family Medicine

## 2010-06-11 ENCOUNTER — Encounter: Payer: Self-pay | Admitting: Family Medicine

## 2010-06-11 VITALS — BP 109/78

## 2010-06-11 DIAGNOSIS — M79609 Pain in unspecified limb: Secondary | ICD-10-CM

## 2010-06-11 DIAGNOSIS — M79673 Pain in unspecified foot: Secondary | ICD-10-CM

## 2010-06-11 MED ORDER — KETOPROFEN POWD: Status: DC

## 2010-06-12 DIAGNOSIS — M79673 Pain in unspecified foot: Secondary | ICD-10-CM | POA: Insufficient documentation

## 2010-06-12 NOTE — Progress Notes (Signed)
  Subjective:    Patient ID: Laura [name], female    DOB: 11-May-1965, 45 y.o.   MRN: [id number]  HPI 45 yo F f/u Rt AT nodule. We ultrasounded last visit and found peritendon nodule on Rt AT outside tendon, and injected under US guidance.  She thinks it is a little smaller.  We also gave her heel lifts, but she did not like these. Still without significant pain.   Review of Systems Denies F/S/C    Objective:   Physical Exam Gen:NAD RLE: Neg Thompson's.  No AT ttp.  + small 1 cm nodular mass on lateral aspect of AT.  Nl strength.  MSK [place] Rt AT: Performed with Dr. Darrick [name].  AT still normal size and structure, no evidence of tears.  We measured nodular area which is slightly smaller compared to prior exam, about 1.2 cm x 0.2 cm.  Character appears to have a fibrous aspect to it.       Assessment & Plan:  Rt AT cyst/nodule.  I discussed with Dr. Darrick [name], and we think this may be a peritendinous fibrous nodular structure.  May be result of reaction to small AT injury, but exact etiology still not clear. - Reassured patient very low likelihood to be malignant in nature. - trial of heel gel pads to be placed in posterior shoe and cushion AT - F/u 4-6 weeks  WU:[id number] [name]

## 2010-11-11 ENCOUNTER — Other Ambulatory Visit: Payer: Self-pay | Admitting: Internal Medicine

## 2010-11-11 DIAGNOSIS — Z1231 Encounter for screening mammogram for malignant neoplasm of breast: Secondary | ICD-10-CM

## 2010-11-28 ENCOUNTER — Ambulatory Visit (HOSPITAL_COMMUNITY)
Admission: RE | Admit: 2010-11-28 | Discharge: 2010-11-28 | Disposition: A | Discharge status: Home / Self Care | Payer: Self-pay | Source: Ambulatory Visit | Attending: Internal Medicine | Admitting: Internal Medicine

## 2010-11-28 DIAGNOSIS — Z1231 Encounter for screening mammogram for malignant neoplasm of breast: Secondary | ICD-10-CM | POA: Insufficient documentation

## 2011-01-01 ENCOUNTER — Ambulatory Visit: Payer: Self-pay | Admitting: Physical Medicine & Rehabilitation

## 2011-01-01 ENCOUNTER — Encounter: Payer: Self-pay | Attending: Physical Medicine & Rehabilitation

## 2011-01-01 DIAGNOSIS — M79609 Pain in unspecified limb: Secondary | ICD-10-CM | POA: Insufficient documentation

## 2011-01-01 DIAGNOSIS — G5610 Other lesions of median nerve, unspecified upper limb: Secondary | ICD-10-CM | POA: Insufficient documentation

## 2011-01-01 DIAGNOSIS — R209 Unspecified disturbances of skin sensation: Secondary | ICD-10-CM | POA: Insufficient documentation

## 2013-10-21 ENCOUNTER — Emergency Department (HOSPITAL_COMMUNITY)
Admission: EM | Admit: 2013-10-21 | Discharge: 2013-10-21 | Disposition: A | Discharge status: Home / Self Care | Payer: No Typology Code available for payment source | Attending: Emergency Medicine | Admitting: Emergency Medicine

## 2013-10-21 ENCOUNTER — Encounter (HOSPITAL_COMMUNITY): Payer: Self-pay | Admitting: Emergency Medicine

## 2013-10-21 DIAGNOSIS — L03211 Cellulitis of face: Secondary | ICD-10-CM

## 2013-10-21 DIAGNOSIS — F17200 Nicotine dependence, unspecified, uncomplicated: Secondary | ICD-10-CM | POA: Insufficient documentation

## 2013-10-21 DIAGNOSIS — L723 Sebaceous cyst: Secondary | ICD-10-CM | POA: Diagnosis not present

## 2013-10-21 DIAGNOSIS — L0201 Cutaneous abscess of face: Secondary | ICD-10-CM | POA: Insufficient documentation

## 2013-10-21 LAB — BASIC METABOLIC PANEL
ANION GAP: 12 (ref 5–15)
BUN: 7 mg/dL (ref 6–23)
CALCIUM: 9.1 mg/dL (ref 8.4–10.5)
CO2: 25 mEq/L (ref 19–32)
CREATININE: 0.72 mg/dL (ref 0.50–1.10)
Chloride: 102 mEq/L (ref 96–112)
GFR calc non Af Amer: >90 mL/min (ref >90)
Glucose, Bld: 89 mg/dL (ref 70–99)
Potassium: 4 mEq/L (ref 3.7–5.3)
SODIUM: 139 meq/L (ref 137–147)

## 2013-10-21 LAB — CBC WITH DIFFERENTIAL/PLATELET
BASOS ABS: 0 10*3/uL (ref 0.0–0.1)
BASOS PCT: 0 % (ref 0–1)
Eosinophils Absolute: 0.1 10*3/uL (ref 0.0–0.7)
Eosinophils Relative: 1 % (ref 0–5)
HCT: 39.2 % (ref 36.0–46.0)
Hemoglobin: 13.7 g/dL (ref 12.0–15.0)
Lymphocytes Relative: 11 % — ABNORMAL LOW (ref 12–46)
Lymphs Abs: 1.1 10*3/uL (ref 0.7–4.0)
MCH: 33 pg (ref 26.0–34.0)
MCHC: 34.9 g/dL (ref 30.0–36.0)
MCV: 94.5 fL (ref 78.0–100.0)
Monocytes Absolute: 0.6 10*3/uL (ref 0.1–1.0)
Monocytes Relative: 7 % (ref 3–12)
NEUTROS ABS: 7.9 10*3/uL — AB (ref 1.7–7.7)
NEUTROS PCT: 81 % — AB (ref 43–77)
PLATELETS: 314 10*3/uL (ref 150–400)
RBC: 4.15 MIL/uL (ref 3.87–5.11)
RDW: 12.9 % (ref 11.5–15.5)
WBC: 9.7 10*3/uL (ref 4.0–10.5)

## 2013-10-21 MED ORDER — CLINDAMYCIN HCL 150 MG PO CAPS: 300.0000 mg | ORAL_CAPSULE | Freq: Three times a day (TID) | ORAL | Status: DC

## 2013-10-21 MED ORDER — OXYCODONE-ACETAMINOPHEN 5-325 MG PO TABS: 1.0000 | ORAL_TABLET | ORAL | Status: DC | PRN

## 2013-10-21 MED ORDER — CLINDAMYCIN PHOSPHATE 900 MG/50ML IV SOLN
900.0000 mg | Freq: Once | INTRAVENOUS | Status: AC
  Administered 2013-10-21: 900 mg via INTRAVENOUS
  Filled 2013-10-21: qty 50

## 2013-10-21 MED ORDER — LIDOCAINE HCL 2 % IJ SOLN
10.0000 mL | Freq: Once | INTRAMUSCULAR | Status: DC
  Filled 2013-10-21: qty 20

## 2013-10-21 MED ORDER — DEXAMETHASONE SODIUM PHOSPHATE 10 MG/ML IJ SOLN
16.0000 mg | Freq: Once | INTRAMUSCULAR | Status: AC
  Administered 2013-10-21: 16 mg via INTRAVENOUS
  Filled 2013-10-21: qty 2

## 2013-10-21 MED ORDER — MORPHINE SULFATE 4 MG/ML IJ SOLN
4.0000 mg | Freq: Once | INTRAMUSCULAR | Status: AC
  Administered 2013-10-21: 4 mg via INTRAVENOUS
  Filled 2013-10-21: qty 1

## 2013-10-21 NOTE — ED Provider Notes (Signed)
Pt seen and evaluated. D/W PA. J. Piepenbrink.  History of palpable "cyst" in right anterior cheek for "years".  He became inflamed yesterday. She attempted to "pop" the cyst. She had some purulent drainage. However she awakened early this morning is marked swelling of her right cheek and face. No dental abnormalities or pain. No intraoral swelling. No neck swelling. Mild trismus.  Exam shows swelling of the right cheek. Palpable probable small cysts on bimanual exam the right cheek. Ultrasound shows round immediate subcutaneous structure containing some anechoic, and some echogenic portions. Consistent with probable sebaceous cyst with acute infection and abscess.  [place] with patient. She consents for aspiration. We like to avoid incision possible. IV is placed. Patient will be given IV vancomycin Decadron and pain medicine.  INCISION AND DRAINAGE Performed by: Claudean Kinds Consent: Verbal consent obtained. Risks and benefits: risks, benefits and alternatives were discussed Type: sebaceous cyst/abscess  Body area: right cheek    Anesthesia: local infiltration  18g needle aspiration using [place] localization  Local anesthetic: lidocaine 2% c epinephrine  Anesthetic total: 3 ml  Complexity: complex  Drainage: purulent  Drainage amount: 1cc  Patient tolerance: Patient tolerated the procedure well with no immediate complications.     Rolland Porter, MD 10/21/13 1235

## 2013-10-21 NOTE — Discharge Instructions (Signed)
Please follow up with your primary care physician in 1-2 days. If you do not have one please call the [facility] number listed above. Please follow up with Dr. Kelly Splinter to schedule a follow up appointment. Please take your antibiotic until completion. Please read all discharge instructions and return precautions.   Epidermal Cyst An epidermal cyst is sometimes called a sebaceous cyst, epidermal inclusion cyst, or infundibular cyst. These cysts usually contain a substance that looks "pasty" or "cheesy" and may have a bad smell. This substance is a protein called keratin. Epidermal cysts are usually found on the face, neck, or trunk. They may also occur in the vaginal area or other parts of the genitalia of both men and women. Epidermal cysts are usually small, painless, slow-growing bumps or lumps that move freely under the skin. It is important not to try to pop them. This may cause an infection and lead to tenderness and swelling. CAUSES  Epidermal cysts may be caused by a deep penetrating injury to the skin or a plugged hair follicle, often associated with acne. SYMPTOMS  Epidermal cysts can become inflamed and cause:  Redness.  Tenderness.  Increased temperature of the skin over the bumps or lumps.  Grayish-white, bad smelling material that drains from the bump or lump. DIAGNOSIS  Epidermal cysts are easily diagnosed by your caregiver during an exam. Rarely, a tissue sample (biopsy) may be taken to rule out other conditions that may resemble epidermal cysts. TREATMENT   Epidermal cysts often get better and disappear on their own. They are rarely ever cancerous.  If a cyst becomes infected, it may become inflamed and tender. This may require opening and draining the cyst. Treatment with antibiotics may be necessary. When the infection is gone, the cyst may be removed with minor surgery.  Small, inflamed cysts can often be treated with antibiotics or by injecting steroid  medicines.  Sometimes, epidermal cysts become large and bothersome. If this happens, surgical removal in your caregiver's office may be necessary. HOME CARE INSTRUCTIONS  Only take over-the-counter or prescription medicines as directed by your caregiver.  Take your antibiotics as directed. Finish them even if you start to feel better. SEEK MEDICAL CARE IF:   Your cyst becomes tender, red, or swollen.  Your condition is not improving or is getting worse.  You have any other questions or concerns. MAKE SURE YOU:  Understand these instructions.  Will watch your condition.  Will get help right away if you are not doing well or get worse. Document Released: 12/14/2003 Document Revised: 04/06/2011 Document Reviewed: 07/21/2010 [facility] Patient Information 2015 [place], [place]. This information is not intended to replace advice given to you by your health care provider. Make sure you discuss any questions you have with your health care provider.   Cellulitis Cellulitis is an infection of the skin and the tissue beneath it. The infected area is usually red and tender. Cellulitis occurs most often in the arms and lower legs.  CAUSES  Cellulitis is caused by bacteria that enter the skin through cracks or cuts in the skin. The most common types of bacteria that cause cellulitis are staphylococci and streptococci. SIGNS AND SYMPTOMS   Redness and warmth.  Swelling.  Tenderness or pain.  Fever. DIAGNOSIS  Your health care provider can usually determine what is wrong based on a physical exam. Blood tests may also be done. TREATMENT  Treatment usually involves taking an antibiotic medicine. HOME CARE INSTRUCTIONS   Take your antibiotic  medicine as directed by your health care provider. Finish the antibiotic even if you start to feel better.  Keep the infected arm or leg elevated to reduce swelling.  Apply a warm cloth to the affected area up to 4 times per day to relieve  pain.  Take medicines only as directed by your health care provider.  Keep all follow-up visits as directed by your health care provider. SEEK MEDICAL CARE IF:   You notice red streaks coming from the infected area.  Your red area gets larger or turns dark in color.  Your bone or joint underneath the infected area becomes painful after the skin has healed.  Your infection returns in the same area or another area.  You notice a swollen bump in the infected area.  You develop new symptoms.  You have a fever. SEEK IMMEDIATE MEDICAL CARE IF:   You feel very sleepy.  You develop vomiting or diarrhea.  You have a general ill feeling (malaise) with muscle aches and pains. MAKE SURE YOU:   Understand these instructions.  Will watch your condition.  Will get help right away if you are not doing well or get worse. Document Released: 10/22/2004 Document Revised: 05/29/2013 Document Reviewed: 03/30/2011 [facility] Patient Information 2015 [place], [place]. This information is not intended to replace advice given to you by your health care provider. Make sure you discuss any questions you have with your health care provider.

## 2013-10-21 NOTE — ED Provider Notes (Signed)
CSN: [id number]     Arrival date & time 10/21/13  1050 History  This chart was scribed for non-physician practitioner working with No att. providers found by Elveria Rising, ED Scribe. This patient was seen in room TR10C/TR10C and the patient's care was started at 11:34 AM.   Chief Complaint  Patient presents with  . Abscess    The history is provided by the patient. No language interpreter was used.   HPI Comments: Laura [name] is a 48 y.o. female who presents to the Emergency Department complaining of right buccal abscess and facial swelling, onset early this morning. Patient reports several year history of benign nodule in her cheek. Patient popped the the node yesterday after leaving work and reports release of external pus like drainage. She wakened this morning to severe right facial swelling and redness at the site. Patient reports exacerbation with opening and closing the mouth. Attempted treatment with ibuprofen without relief.   Patient denies dental or facial injuries, difficulty breathing, trouble or inability swallowing, or sore throat. She denies other medical complaints.  History reviewed. No pertinent past medical history. History reviewed. No pertinent past surgical history. No family history on file. History  Substance Use Topics  . Smoking status: Current Every Day Smoker  . Smokeless tobacco: Not on file  . Alcohol Use: No   OB History   Grav Para Term Preterm Abortions TAB SAB Ect Mult Living                 Review of Systems  Constitutional: Negative for fever and chills.  HENT: Positive for facial swelling. Negative for dental problem, rhinorrhea, sore throat, trouble swallowing and voice change.   Eyes: Negative for visual disturbance.  All other systems reviewed and are negative.  Allergies  Aspirin  Home Medications   Prior to Admission medications   Medication Sig Start Date End Date Taking? Authorizing Provider  clindamycin (CLEOCIN) 150 MG  capsule Take 2 capsules (300 mg total) by mouth 3 (three) times daily. May dispense as  capsules 10/21/13   Kinslie Hove L Cecily Lawhorne, [name]  Ketoprofen POWD 20% gel tid prn aaa 06/11/10   Noralee Chars, MD  oxyCODONE-acetaminophen (PERCOCET/ROXICET) 5-325 MG per tablet Take 1 tablet by mouth every 4 (four) hours as needed for severe pain. May take 2 tablets PO q 6 hours for severe pain - Do not take with Tylenol as this tablet already contains tylenol 10/21/13   Lise Auer Shrihan Putt, [name]   Triage Vitals: BP 131/80  Pulse 91  Temp(Src) 98.4 F (36.9 C) (Oral)  Resp 17  Ht  (1.575 m)  Wt 110 lb (49.896 kg)  BMI 20.11 kg/m2  SpO2 98%  Physical Exam  Nursing note and vitals reviewed. Constitutional: She is oriented to person, place, and time. She appears well-developed and well-nourished. No distress.  HENT:  Head: Normocephalic and atraumatic.  Right Ear: External ear normal.  Left Ear: External ear normal.  Nose: Nose normal.  Mouth/Throat: Oropharynx is clear and moist.  Eyes: Conjunctivae are normal.  Neck: Normal range of motion. Neck supple.  Cardiovascular: Normal rate.   Pulmonary/Chest: Effort normal.  Abdominal: Soft.  Musculoskeletal: Normal range of motion.  Neurological: She is alert and oriented to person, place, and time.  Skin: Skin is warm and dry. She is not diaphoretic.  Psychiatric: She has a normal mood and affect.    ED Course  Procedures (including critical care time) Medications  clindamycin (CLEOCIN) IVPB 900 mg (0  mg Intravenous Stopped 10/21/13 1308)  morphine 4 MG/ML injection 4 mg (4 mg Intravenous Given 10/21/13 1218)  dexamethasone (DECADRON) injection 16 mg (16 mg Intravenous Given 10/21/13 1221)    COORDINATION OF CARE: 11:36 AM- Patient will be referred to ENT for evaluation and prescribed medication to manage her pain. Discussed treatment plan with patient at bedside and patient agreed to plan.   Labs Review Labs Reviewed  CBC WITH  DIFFERENTIAL - Abnormal; Notable for the following:    Neutrophils Relative % 81 (*)    Neutro Abs 7.9 (*)    Lymphocytes Relative 11 (*)    All other components within normal limits  BASIC METABOLIC PANEL    Imaging Review No results found.   EKG Interpretation None      MDM   Final diagnoses:  Facial cellulitis  Sebaceous cyst    Filed Vitals:   10/21/13 1408  BP: 118/78  Pulse: 82  Temp: 98.6 F (37 C)  Resp: 16   Afebrile, NAD, non-toxic appearing, AAOx4. Moderate right cheek facial swelling without dental abnormalities, intraoral swelling. Mild trismus. Patient with likely infected sebaceous cyst as noted on ultrasound now performed by Dr. Fayrene Fearing. Area was incised and drained with approximately 1 cc of purulent drainage. Patient was given IV Decadron, pain medicine and clindamycin. Symptoms improved after IV medication administration and incision and drainage. Advised ENT and or blastic surgery followup for removal of sebaceous cyst. Return if worsens discussed. Patient is agreeable to plan. Patient is stable at time of discharge Patient d/w with Dr. Fayrene Fearing, agrees with plan.      I personally performed the services described in this documentation, which was scribed in my presence. The recorded information has been reviewed and is accurate.    Lise Auer Shannin Naab, [name] 10/26/13 1009

## 2013-10-21 NOTE — ED Notes (Signed)
Pt. Stated, I popped a bump and this morning it swell up my check really big and painful.

## 2013-11-02 NOTE — ED Provider Notes (Signed)
Medical screening examination/treatment/procedure(s) were performed by non-physician practitioner and as supervising physician I was immediately available for consultation/collaboration.   EKG Interpretation None        Rolland PorterMark Kristeen Lantz, MD 11/02/13 [phone]

## 2014-02-22 ENCOUNTER — Other Ambulatory Visit: Payer: Self-pay | Admitting: Family Medicine

## 2014-02-22 ENCOUNTER — Ambulatory Visit
Admission: RE | Admit: 2014-02-22 | Discharge: 2014-02-22 | Disposition: A | Discharge status: Home / Self Care | Payer: 59 | Source: Ambulatory Visit | Attending: Family Medicine | Admitting: Family Medicine

## 2014-02-22 DIAGNOSIS — R519 Headache, unspecified: Secondary | ICD-10-CM

## 2014-02-22 DIAGNOSIS — I671 Cerebral aneurysm, nonruptured: Secondary | ICD-10-CM

## 2014-02-22 DIAGNOSIS — R51 Headache: Principal | ICD-10-CM

## 2014-02-22 MED ORDER — IOHEXOL 300 MG/ML  SOLN
75.0000 mL | Freq: Once | INTRAMUSCULAR | Status: AC | PRN
  Administered 2014-02-22: 75 mL via INTRAVENOUS

## 2014-02-23 ENCOUNTER — Other Ambulatory Visit: Payer: Self-pay | Admitting: Family Medicine

## 2014-02-23 DIAGNOSIS — I671 Cerebral aneurysm, nonruptured: Secondary | ICD-10-CM

## 2014-02-27 ENCOUNTER — Other Ambulatory Visit: Payer: Self-pay | Admitting: Family Medicine

## 2014-02-27 DIAGNOSIS — Z1231 Encounter for screening mammogram for malignant neoplasm of breast: Secondary | ICD-10-CM

## 2014-03-04 ENCOUNTER — Ambulatory Visit
Admission: RE | Admit: 2014-03-04 | Discharge: 2014-03-04 | Disposition: A | Discharge status: Home / Self Care | Payer: 59 | Source: Ambulatory Visit | Attending: Family Medicine | Admitting: Family Medicine

## 2014-03-04 DIAGNOSIS — I671 Cerebral aneurysm, nonruptured: Secondary | ICD-10-CM

## 2014-03-07 ENCOUNTER — Ambulatory Visit
Admission: RE | Admit: 2014-03-07 | Discharge: 2014-03-07 | Disposition: A | Discharge status: Home / Self Care | Payer: 59 | Source: Ambulatory Visit | Attending: Family Medicine | Admitting: Family Medicine

## 2014-03-07 DIAGNOSIS — Z1231 Encounter for screening mammogram for malignant neoplasm of breast: Secondary | ICD-10-CM

## 2014-03-30 ENCOUNTER — Other Ambulatory Visit (HOSPITAL_COMMUNITY): Payer: Self-pay | Admitting: Neurosurgery

## 2014-03-30 DIAGNOSIS — I671 Cerebral aneurysm, nonruptured: Secondary | ICD-10-CM

## 2014-05-01 ENCOUNTER — Other Ambulatory Visit (HOSPITAL_COMMUNITY): Payer: Self-pay | Admitting: Neurosurgery

## 2014-05-01 ENCOUNTER — Ambulatory Visit (HOSPITAL_COMMUNITY)
Admission: RE | Admit: 2014-05-01 | Discharge: 2014-05-01 | Disposition: A | Discharge status: Home / Self Care | Payer: 59 | Source: Ambulatory Visit | Attending: Neurosurgery | Admitting: Neurosurgery

## 2014-05-01 DIAGNOSIS — Q278 Other specified congenital malformations of peripheral vascular system: Secondary | ICD-10-CM | POA: Diagnosis present

## 2014-05-01 DIAGNOSIS — F17210 Nicotine dependence, cigarettes, uncomplicated: Secondary | ICD-10-CM | POA: Diagnosis not present

## 2014-05-01 DIAGNOSIS — I671 Cerebral aneurysm, nonruptured: Secondary | ICD-10-CM

## 2014-05-01 LAB — CBC WITH DIFFERENTIAL/PLATELET
Basophils Absolute: 0 10*3/uL (ref 0.0–0.1)
Basophils Relative: 0 % (ref 0–1)
Eosinophils Absolute: 0.1 10*3/uL (ref 0.0–0.7)
Eosinophils Relative: 1 % (ref 0–5)
HCT: 37 % (ref 36.0–46.0)
HEMOGLOBIN: 13 g/dL (ref 12.0–15.0)
LYMPHS ABS: 1.8 10*3/uL (ref 0.7–4.0)
Lymphocytes Relative: 25 % (ref 12–46)
MCH: 32.7 pg (ref 26.0–34.0)
MCHC: 35.1 g/dL (ref 30.0–36.0)
MCV: 93 fL (ref 78.0–100.0)
MONOS PCT: 8 % (ref 3–12)
Monocytes Absolute: 0.6 10*3/uL (ref 0.1–1.0)
Neutro Abs: 4.7 10*3/uL (ref 1.7–7.7)
Neutrophils Relative %: 66 % (ref 43–77)
Platelets: 239 10*3/uL (ref 150–400)
RBC: 3.98 MIL/uL (ref 3.87–5.11)
RDW: 12.5 % (ref 11.5–15.5)
WBC: 7.2 10*3/uL (ref 4.0–10.5)

## 2014-05-01 LAB — URINE MICROSCOPIC-ADD ON

## 2014-05-01 LAB — URINALYSIS, ROUTINE W REFLEX MICROSCOPIC
Bilirubin Urine: NEGATIVE
GLUCOSE, UA: NEGATIVE mg/dL
KETONES UR: NEGATIVE mg/dL
Leukocytes, UA: NEGATIVE
Nitrite: NEGATIVE
PH: 5 (ref 5.0–8.0)
Protein, ur: NEGATIVE mg/dL
Specific Gravity, Urine: 1.013 (ref 1.005–1.030)
Urobilinogen, UA: 0.2 mg/dL (ref 0.0–1.0)

## 2014-05-01 LAB — BASIC METABOLIC PANEL
ANION GAP: 5 (ref 5–15)
BUN: 5 mg/dL — ABNORMAL LOW (ref 6–23)
CALCIUM: 9 mg/dL (ref 8.4–10.5)
CO2: 27 mmol/L (ref 19–32)
Chloride: 106 mmol/L (ref 96–112)
Creatinine, Ser: 0.78 mg/dL (ref 0.50–1.10)
GFR calc Af Amer: >90 mL/min (ref >90)
GLUCOSE: 89 mg/dL (ref 70–99)
Potassium: 3.4 mmol/L — ABNORMAL LOW (ref 3.5–5.1)
SODIUM: 138 mmol/L (ref 135–145)

## 2014-05-01 LAB — PROTIME-INR
INR: 1.09 (ref 0.00–1.49)
PROTHROMBIN TIME: 14.3 s (ref 11.6–15.2)

## 2014-05-01 LAB — APTT: aPTT: 30 seconds (ref 24–37)

## 2014-05-01 MED ORDER — LIDOCAINE HCL 1 % IJ SOLN
INTRAMUSCULAR | Status: AC
  Filled 2014-05-01: qty 20

## 2014-05-01 MED ORDER — MIDAZOLAM HCL 2 MG/2ML IJ SOLN
INTRAMUSCULAR | Status: AC | PRN
  Administered 2014-05-01 (×2): 0.5 mg via INTRAVENOUS

## 2014-05-01 MED ORDER — HYDROCODONE-ACETAMINOPHEN 5-325 MG PO TABS
ORAL_TABLET | ORAL | Status: AC
  Filled 2014-05-01: qty 1

## 2014-05-01 MED ORDER — HYDROCODONE-ACETAMINOPHEN 5-325 MG PO TABS
1.0000 | ORAL_TABLET | ORAL | Status: DC | PRN
  Administered 2014-05-01: 1 via ORAL

## 2014-05-01 MED ORDER — SODIUM CHLORIDE 0.9 % IV SOLN
INTRAVENOUS | Status: DC
  Administered 2014-05-01: 12:00:00 via INTRAVENOUS

## 2014-05-01 MED ORDER — FENTANYL CITRATE 0.05 MG/ML IJ SOLN
INTRAMUSCULAR | Status: AC
  Filled 2014-05-01: qty 2

## 2014-05-01 MED ORDER — FENTANYL CITRATE 0.05 MG/ML IJ SOLN
INTRAMUSCULAR | Status: AC | PRN
  Administered 2014-05-01: 25 ug via INTRAVENOUS

## 2014-05-01 MED ORDER — HEPARIN SOD (PORK) LOCK FLUSH 100 UNIT/ML IV SOLN
INTRAVENOUS | Status: AC | PRN
  Administered 2014-05-01: 2000 [IU] via INTRAVENOUS

## 2014-05-01 MED ORDER — HEPARIN SOD (PORK) LOCK FLUSH 100 UNIT/ML IV SOLN
INTRAVENOUS | Status: AC
  Filled 2014-05-01: qty 25

## 2014-05-01 MED ORDER — MIDAZOLAM HCL 2 MG/2ML IJ SOLN
INTRAMUSCULAR | Status: AC
  Filled 2014-05-01: qty 2

## 2014-05-01 MED ORDER — IOHEXOL 300 MG/ML  SOLN
150.0000 mL | Freq: Once | INTRAMUSCULAR | Status: AC | PRN
  Administered 2014-05-01: 70 mL via INTRA_ARTERIAL

## 2014-05-01 NOTE — Sedation Documentation (Signed)
Patient denies pain and is resting comfortably.  

## 2014-05-01 NOTE — Discharge Instructions (Signed)

## 2014-05-01 NOTE — H&P (Signed)
CC:  Aneurysm  HPI: Laura [name] is a 49 y.o. 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 She has recently underwent f/u MRI/MRA which demonstrated possible enlargement of this aneurysm and now presents for further w/u with diagnostic angiogram.  PMH: No past medical history on file.  PSH: No past surgical history on file.  SH: History  Substance Use Topics  . Smoking status: Current Every Day Smoker  . Smokeless tobacco: Not on file  . Alcohol Use: No    MEDS: Prior to Admission medications   Medication Sig Start Date End Date Taking? Authorizing Provider  gabapentin (NEURONTIN) 300 MG capsule Take 600 mg by mouth every morning.    Yes Historical Provider, MD  clindamycin (CLEOCIN) 150 MG capsule Take 2 capsules (300 mg total) by mouth 3 (three) times daily. May dispense as 150mg  capsules Patient not taking: Reported on 04/27/2014 10/21/13   Francee PiccoloJennifer Piepenbrink, [name]  Ketoprofen POWD 20% gel tid prn aaa Patient not taking: Reported on 04/27/2014 06/11/10   Noralee CharsJustin A Lee, MD  oxyCODONE-acetaminophen (PERCOCET/ROXICET) 5-325 MG per tablet Take 1 tablet by mouth every 4 (four) hours as needed for severe pain. May take 2 tablets PO q 6 hours for severe pain - Do not take with Tylenol as this tablet already contains tylenol Patient not taking: Reported on 04/27/2014 10/21/13   Francee PiccoloJennifer Piepenbrink, [name]    ALLERGY: Allergies  Allergen Reactions  . Aspirin     REACTION: swelling    ROS: ROS  NEUROLOGIC EXAM: Awake, alert, oriented Memory and concentration grossly intact Speech fluent, appropriate CN grossly intact Motor exam: Upper Extremities Deltoid Bicep Tricep Grip  Right 5/5 5/5 5/5 5/5  Left 5/5 5/5 5/5 5/5   Lower Extremity IP Quad PF DF EHL  Right 5/5 5/5 5/5 5/5 5/5  Left 5/5 5/5 5/5 5/5 5/5   Sensation grossly intact to LT  IMPRESSION: - 49 y.o. female with incidentally discovered  possible enlargement of a supraclinoid RICA aneurysm  PLAN: - Proceed with diagnostic cerebral angiogram  The indications, risks, and benefits of angiography were discussed with the patient and her family in the office. All questions were answered, and she provided informed consent.

## 2014-05-01 NOTE — Progress Notes (Signed)
PREOP DX:  1. Possible RICA aneurysm 2. Previous LVA dissection  POSTOP DX: Same  PROCEDURE: Diagnostic cerebral angiogram  SURGEON: Dr. Lisbeth RenshawNeelesh Dameion Briles, MD  ANESTHESIA: IV Sedation with Local  EBL: Minimal  SPECIMENS: None  COMPLICATIONS: None  CONDITION: Stable to recovery  FINDINGS: 1. Fenestration of the supraclinoid RICA with origin of the Pcom from the fenestration. No aneurysm is present. 2. Normal appearance of the V2, V3, and V4 segments of the LVA without evidence of dissection, stenosis, or pseudoaneurysm

## 2014-05-01 NOTE — Progress Notes (Signed)
Late entry.Marland Kitchen.Marland Kitchen.Patient was requesting pain medication to take home for her headache.  Spoke with Dr. Conchita ParisNundkumar who was in a case and stated to inform patient to call his office once home if pain medication was still needed after discharge.  Patient informed about calling the office once home if needed.  Patient discharged home with family.

## 2014-05-16 ENCOUNTER — Telehealth: Payer: Self-pay | Admitting: Cardiovascular Disease

## 2014-05-16 NOTE — Telephone Encounter (Signed)
[facility] appointment on 05/18/14 with Dr Allyson SabalBerry.  [facility] ([facility] for Dr Hazle CocaBerry's schedule on 05/18/14. lp

## 2014-05-18 ENCOUNTER — Ambulatory Visit (INDEPENDENT_AMBULATORY_CARE_PROVIDER_SITE_OTHER): Payer: 59 | Admitting: Cardiovascular Disease

## 2014-05-18 ENCOUNTER — Encounter: Payer: Self-pay | Admitting: Cardiovascular Disease

## 2014-05-18 VITALS — BP 102/64 | HR 82 | Ht 63.0 in | Wt 107.2 lb

## 2014-05-18 DIAGNOSIS — I739 Peripheral vascular disease, unspecified: Secondary | ICD-10-CM

## 2014-05-18 DIAGNOSIS — Z720 Tobacco use: Secondary | ICD-10-CM

## 2014-05-18 DIAGNOSIS — I779 Disorder of arteries and arterioles, unspecified: Secondary | ICD-10-CM

## 2014-05-18 NOTE — Assessment & Plan Note (Signed)
History of 30-pack-years of tobacco abuse currently smoking one pack per day, recalcitrant to resect modification

## 2014-05-18 NOTE — Assessment & Plan Note (Signed)
History of left vertebral artery dissection diagnosed in 2011. She recently had an MRA MR I which suggested  A 2.9 mm posterior communicating artery aneurysm.  She underwent angiography by Dr.Neelesh Nundkumar revealing a fenestration of her supraclinoid right internal carotid artery with the origin of the PCOM , arising from this. There was no aneurysm. Her vertebral artery showed no evidence of dissection or stenosis. At this point, there is no cerebrovascular pathology. Either her vertebral artery healed or the original diagnosis was incorrect. No further workup is indicated at this time

## 2014-05-18 NOTE — Patient Instructions (Signed)
Follow up with Dr Berry as needed.  

## 2014-05-18 NOTE — Progress Notes (Signed)
05/18/2014 Laura PeltonMichele L [name]   07-08-1965  [id number]  Primary Physician [facility]AB[facility], MD Primary Cardiologist: Runell GessJonathan J. Keric Zehren MD Roseanne RenoFACP,FACC,FAHA, FSCAI   HPI:  Mrs. Laura [name] is a very pleasant 49 year old thin appearing married Caucasian female mother of 3 children who works doing [employer]. She was referred by Dr. [facility], her primary care physician, for cardiovascular evaluation. She does have a history of tobacco abuse currently smoking one pack per day for last 30 years. Apparently her father had stents. She has never had a heart attack or stroke and complains of occasional atypical chest pain and some shortness of breath. She does have migraines. She was diagnosed with a left vertebral artery dissection back in 2011. Recently an MRA/MRI suggested at 2.9 mm aneurysm of the posterior communicating  artery which led to a cerebral angiogram showing a fenestration of her supraclinoid right internal carotid artery but no aneurysm and her left vertebral artery was completely normal.   Current Outpatient Prescriptions  Medication Sig Dispense Refill  . gabapentin (NEURONTIN) 300 MG capsule Take 300 mg by mouth every morning.      No current facility-administered medications for this visit.    Allergies  Allergen Reactions  . Aspirin     REACTION: swelling    History   Social History  . Marital Status: Married    Spouse Name: N/A  . Number of Children: N/A  . Years of Education: N/A   Occupational History  . Not on file.   Social History Main Topics  . Smoking status: Current Every Day Smoker -- 1.00 packs/day    Types: Cigarettes  . Smokeless tobacco: Never Used  . Alcohol Use: No  . Drug Use: Not on file  . Sexual Activity: Not on file   Other Topics Concern  . Not on file   Social History Narrative     Review of Systems: General: negative for chills, fever, night sweats or weight changes.  Cardiovascular: negative for chest  pain, dyspnea on exertion, edema, orthopnea, palpitations, paroxysmal nocturnal dyspnea or shortness of breath Dermatological: negative for rash Respiratory: negative for cough or wheezing Urologic: negative for hematuria Abdominal: negative for nausea, vomiting, diarrhea, bright red blood per rectum, melena, or hematemesis Neurologic: negative for visual changes, syncope, or dizziness All other systems reviewed and are otherwise negative except as noted above.    Blood pressure 102/64, pulse 82, height 5\' 3"  (1.6 m), weight 107 lb 3.2 oz (48.626 kg).  General appearance: alert and no distress Neck: no adenopathy, no carotid bruit, no JVD, supple, symmetrical, trachea midline and thyroid not enlarged, symmetric, no tenderness/mass/nodules Lungs: clear to auscultation bilaterally Heart: regular rate and rhythm, S1, S2 normal, no murmur, click, rub or gallop Extremities: extremities normal, atraumatic, no cyanosis or edema  EKG normal sinus rhythm 82 without ST or T-wave changes. I personally reviewed this EKG  ASSESSMENT AND PLAN:   Tobacco abuse History of 30-pack-years of tobacco abuse currently smoking one pack per day, recalcitrant to resect modification   Vertebral artery disease History of left vertebral artery dissection diagnosed in 2011. She recently had an MRA MR I which suggested  A 2.9 mm posterior communicating artery aneurysm.  She underwent angiography by Dr.Neelesh Nundkumar revealing a fenestration of her supraclinoid right internal carotid artery with the origin of the PCOM , arising from this. There was no aneurysm. Her vertebral artery showed no evidence of dissection or stenosis. At this point, there is no cerebrovascular pathology. Either  her vertebral artery healed or the original diagnosis was incorrect. No further workup is indicated at this time       Runell Gess MD [facility], [facility] 05/18/2014 4:50 PM

## 2014-07-07 NOTE — Op Note (Signed)
DIAGNOSTIC CEREBRAL ANGIOGRAM    OPERATOR:   Dr. Lisbeth Renshaw, MD  HISTORY:   The patient is a 49 y.o. yo female With a history of left vertebral artery dissection, and subsequent incidental discovery of a possible right internal carotid artery aneurysm.  Further follow-up with MRA demonstrated possible enlargement of the right internal carotid artery aneurysm, and the patient now presents for further workup with diagnostic cerebral angiogram.  APPROACH:   The technical aspects of the procedure as well as its potential risks and benefits were reviewed with the patient. These risks included but were not limited bleeding, infection, allergic reaction, damage to organs/vital structures, stroke, non-diagnostic procedure, and the catastrophic outcomes of heart attack, coma, and death. With an understanding of these risks, informed consent was obtained and witnessed.    The patient was placed in the supine position on the angiography table and the skin of right groin prepped in the usual sterile fashion. The procedure was performed under local anesthesia (1%-solution of bicarbonate-bufferred Lidoacaine) and conscious sedation with Versed and fentanyl monitored by the in-suite nurse.    A 5- French sheath was introduced in the right common femoral artery using Seldinger technique.  A fluorophase sequence was used to document the sheath position.    HEPARIN: 2000 Units total.   CONTRAST AGENT: 80cc, Omnipaque 300   FLUOROSCOPY TIME: 1.7 combined AP and lateral minutes    CATHETER(S) AND WIRE(S):    5-French JB-1 glidecatheter   0.035" glidewire    VESSELS CATHETERIZED:   Right Internal carotid   Left internal carotid  Right vertebral   Left vertebral   Right common femoral  VESSELS STUDIED:   Right Internal carotid, head Right vertebral, Head Left Internal carotid, head Left vertebral, neck Left vertebral, head Right femoral  PROCEDURAL NARRATIVE:   A 5-Fr JB-1 terumo glide  catheter was advanced over a 0.035 glidewire into the aortic arch. The above vessels were then sequentially catheterized and cervical/cerebral angiograms taken. After review of images, the catheter was removed without incident.    INTERPRETATION:   Right internal carotid: head:   Injection reveals the presence of a widely patent ICA, M1, and A1 segments and their branches. There is no significant stenosis, occlusion, aneurysm or high flow vascular malformation visualized. The previously identified abnormalities suggesting aneurysm actually represents a fenestration of the supraclinoid internal carotid artery, with the posterior communicating artery arising from the fenestrated segment. The parenchymal and venous phases are normal. The venous sinuses are widely patent.    Left internal carotid: head:   Injection reveals the presence of a widely patent ICA, A1, and M1 segments and their branches. There is no significant stenosis, occlusion, aneurysm, or high flow vascular malformation visualized. The parenchymal and venous phases are normal. The venous sinuses are widely patent.    Left vertebral, neck: The cervical segments of the left vertebral artery are unremarkable.  There is no evidence of dissection, pseudoaneurysm, or stenosis/flow limitation.  Left vertebral, head:   Injection reveals the presence of a widely patent vertebral artery. This leads to a widely patent basilar artery that terminates in bilateral P1. The basilar apex is normal. There is no significant stenosis, occlusion, aneurysm, or vascular malformation visualized. The parenchymal and venous phases are normal. The venous sinuses are widely patent.    Right vertebral:    Normal vessel. No PICA aneurysm. See basilar description above.    Right femoral:    Normal vessel. No significant atherosclerotic disease. Arterial sheath in adequate position.  DISPOSITION:  Upon completion of the study, the femoral sheath was removed and  hemostasis obtained using a 5-Fr ExoSeal closure device. Good proximal and distal lower extremity pulses were documented upon achievement of hemostasis.    The procedure was well tolerated and no early complications were observed.       The patient was transferred back to the holding area to be positioned flat in bed for 3 hours of observation.    IMPRESSION:  1. Fenestration of the right supraclinoid internal carotid artery, with origin of the posterior communicating artery arising from the fenestration.  There is no aneurysm identified. 2. No evidence of cervical left vertebral artery dissection, pseudoaneurysm, or stenosis.  The preliminary results of this procedure were shared with the patient and the patient's family.

## 2015-04-03 ENCOUNTER — Other Ambulatory Visit: Payer: Self-pay

## 2015-04-03 DIAGNOSIS — Z1231 Encounter for screening mammogram for malignant neoplasm of breast: Secondary | ICD-10-CM

## 2015-04-16 ENCOUNTER — Ambulatory Visit
Admission: RE | Admit: 2015-04-16 | Discharge: 2015-04-16 | Disposition: A | Discharge status: Home / Self Care | Payer: Managed Care, Other (non HMO) | Source: Ambulatory Visit

## 2015-04-16 DIAGNOSIS — Z1231 Encounter for screening mammogram for malignant neoplasm of breast: Secondary | ICD-10-CM

## 2016-01-27 HISTORY — PX: OTHER SURGICAL HISTORY: SHX169

## 2016-10-12 ENCOUNTER — Ambulatory Visit (INDEPENDENT_AMBULATORY_CARE_PROVIDER_SITE_OTHER): Payer: Managed Care, Other (non HMO)

## 2016-10-12 ENCOUNTER — Encounter (HOSPITAL_COMMUNITY): Payer: Self-pay | Admitting: Urgent Care

## 2016-10-12 ENCOUNTER — Ambulatory Visit (HOSPITAL_COMMUNITY): Payer: Managed Care, Other (non HMO)

## 2016-10-12 ENCOUNTER — Ambulatory Visit (HOSPITAL_COMMUNITY)
Admission: EM | Admit: 2016-10-12 | Discharge: 2016-10-12 | Disposition: A | Discharge status: Home / Self Care | Payer: Managed Care, Other (non HMO) | Attending: Urgent Care | Admitting: Urgent Care

## 2016-10-12 DIAGNOSIS — M25531 Pain in right wrist: Secondary | ICD-10-CM | POA: Diagnosis not present

## 2016-10-12 DIAGNOSIS — M25521 Pain in right elbow: Secondary | ICD-10-CM

## 2016-10-12 DIAGNOSIS — W19XXXA Unspecified fall, initial encounter: Secondary | ICD-10-CM

## 2016-10-12 DIAGNOSIS — M79641 Pain in right hand: Secondary | ICD-10-CM

## 2016-10-12 DIAGNOSIS — S60221A Contusion of right hand, initial encounter: Secondary | ICD-10-CM

## 2016-10-12 DIAGNOSIS — S60211A Contusion of right wrist, initial encounter: Secondary | ICD-10-CM

## 2016-10-12 MED ORDER — KETOROLAC TROMETHAMINE 60 MG/2ML IM SOLN
60.0000 mg | Freq: Once | INTRAMUSCULAR | Status: AC
  Administered 2016-10-12: 60 mg via INTRAMUSCULAR

## 2016-10-12 MED ORDER — KETOROLAC TROMETHAMINE 60 MG/2ML IM SOLN
INTRAMUSCULAR | Status: AC
  Filled 2016-10-12: qty 2

## 2016-10-12 NOTE — ED Provider Notes (Signed)
MRN: [id number] DOB: 06/21/1965  Subjective:   Laura [name] is a 51 y.o. female presenting for chief complaint of right arm pain.  Reports suffering a fall last night leading to injury of her right hand/wrist. Today, she reports that she has very sharp pain, difficulty moving her wrist, hand. Now feels pain radiating in her 4th, 5th right fingers, right forearm and elbow. She cannot recall mechanism of injury, states that the fall happened very quickly. Has tried ibuprofen twice since yesterday. Denies bruising, laceration, head trauma, loss of consciousness.   No current facility-administered medications for this encounter.    Current Outpatient Prescriptions  Medication Sig Dispense Refill  . gabapentin (NEURONTIN) 300 MG capsule Take 300 mg by mouth every morning.      Alantis is allergic to aspirin.  Shamirah  has a past medical history of Tobacco abuse and Vertebral artery disease. Denies past surgical history.  Objective:   Vitals: BP (!) 153/91 (BP Location: Left Arm)   Pulse 83   Temp 98.1 F (36.7 C) (Oral)   Resp 20   SpO2 98%   Physical Exam  Constitutional: She is oriented to person, place, and time. She appears well-developed and well-nourished.  Cardiovascular: Normal rate.   Pulmonary/Chest: Effort normal.  Musculoskeletal:       Right elbow: She exhibits normal range of motion, no swelling, no effusion, no deformity and no laceration. Tenderness found. Olecranon process tenderness noted. No radial head, no medial epicondyle and no lateral epicondyle tenderness noted.       Right wrist: She exhibits decreased range of motion (full flexion, extension, abduction and adduction) and tenderness (along ulnar aspect). She exhibits no bony tenderness, no swelling, no effusion, no crepitus, no deformity and no laceration.       Right hand: She exhibits decreased range of motion (full flexion of fingers) and tenderness (along metacarpals of 4th-5th fingers). She exhibits no  bony tenderness, normal capillary refill, no deformity, no laceration and no swelling. Normal sensation noted. Normal strength noted.  Neurological: She is alert and oriented to person, place, and time.  Skin: Skin is warm and dry. Capillary refill takes less than 2 seconds.   Dg Forearm Right  Result Date: 10/12/2016 CLINICAL DATA:  Pain following fall EXAM: RIGHT FOREARM - 2 VIEW COMPARISON:  None. FINDINGS: Frontal and lateral views were obtained. There is no fracture or dislocation. Joint spaces appear normal. No erosive change. IMPRESSION: No fracture or dislocation.  No evident arthropathy. Electronically Signed   By: Bretta Bang III M.D.   On: 10/12/2016 14:27   Dg Hand Complete Right  Result Date: 10/12/2016 CLINICAL DATA:  Pain following fall EXAM: RIGHT HAND - COMPLETE 3+ VIEW COMPARISON:  None. FINDINGS: Frontal, oblique, and lateral views were obtained. There is no fracture or dislocation. Joint spaces appear normal. No erosive change. IMPRESSION: No fracture or dislocation.  No evident arthropathy. Electronically Signed   By: Bretta Bang III M.D.   On: 10/12/2016 14:28   Assessment and Plan :   Fall, initial encounter  Pain of right hand  Right wrist pain  Arthralgia of right forearm  Right elbow pain  Contusion of right wrist, initial encounter  Contusion of right hand, initial encounter   Will start conservative management with RICE method, ibuprofen for pain and inflammation. Return-to-clinic precautions discussed, patient verbalized understanding.   Wallis Bamberg, [name] [facility]  10/12/2016  1:54 PM    Wallis Bamberg, [name] 10/12/16 1447

## 2016-10-12 NOTE — ED Triage Notes (Signed)
Patient fell last night.  Patient was going down wooden steps, slipped, and fell down approx 3 steps.  Patient was carrying a baby carrier with right hand. Patient has right wrist pain.  Patient has a sensation of tingling in ring finger and little finger.  Patient has the sensation of "road rash" on forearm, but nothing there.  Patient has brisk capillary refill to finger nail beds of right hand-all fingers and right radial pulse is 2 +.

## 2016-10-12 NOTE — Discharge Instructions (Signed)
You can apply ice to the painful part of your right wrist and hand for 20 minutes every 2 hours for the first 24 hours. Use ibuprofen 600-800mg  every 8 hours with food for pain and inflammation associated with your injury.

## 2016-11-02 ENCOUNTER — Ambulatory Visit (HOSPITAL_COMMUNITY)
Admission: EM | Admit: 2016-11-02 | Discharge: 2016-11-02 | Disposition: A | Discharge status: Home / Self Care | Payer: Managed Care, Other (non HMO) | Attending: Internal Medicine | Admitting: Internal Medicine

## 2016-11-02 ENCOUNTER — Encounter (HOSPITAL_COMMUNITY): Payer: Self-pay | Admitting: Emergency Medicine

## 2016-11-02 DIAGNOSIS — J0110 Acute frontal sinusitis, unspecified: Secondary | ICD-10-CM | POA: Diagnosis not present

## 2016-11-02 MED ORDER — MOMETASONE FUROATE 50 MCG/ACT NA SUSP: 2.0000 | Freq: Every day | NASAL | 12 refills | Status: DC

## 2016-11-02 MED ORDER — AMOXICILLIN-POT CLAVULANATE 875-125 MG PO TABS: 1.0000 | ORAL_TABLET | Freq: Two times a day (BID) | ORAL | 0 refills | Status: AC

## 2016-11-02 NOTE — ED Notes (Signed)
Patient discharged by provider Joycelyn Rua, MD

## 2016-11-02 NOTE — ED Triage Notes (Signed)
Pt here for URI sx with cough and congestion 

## 2017-04-12 ENCOUNTER — Encounter: Payer: Self-pay | Admitting: *Deleted

## 2017-04-13 ENCOUNTER — Encounter: Payer: Self-pay | Admitting: Diagnostic Neuroimaging

## 2017-04-13 ENCOUNTER — Ambulatory Visit (INDEPENDENT_AMBULATORY_CARE_PROVIDER_SITE_OTHER): Payer: Managed Care, Other (non HMO) | Admitting: Diagnostic Neuroimaging

## 2017-04-13 VITALS — BP 137/78 | HR 81 | Ht 63.0 in | Wt 125.8 lb

## 2017-04-13 DIAGNOSIS — G43101 Migraine with aura, not intractable, with status migrainosus: Secondary | ICD-10-CM | POA: Diagnosis not present

## 2017-04-13 DIAGNOSIS — G43701 Chronic migraine without aura, not intractable, with status migrainosus: Secondary | ICD-10-CM | POA: Diagnosis not present

## 2017-04-13 MED ORDER — RIZATRIPTAN BENZOATE 10 MG PO TBDP: 10.0000 mg | ORAL_TABLET | ORAL | 11 refills | Status: DC | PRN

## 2017-04-13 MED ORDER — TOPIRAMATE 50 MG PO TABS: 50.0000 mg | ORAL_TABLET | Freq: Two times a day (BID) | ORAL | 12 refills | Status: DC

## 2017-04-13 NOTE — Progress Notes (Signed)
[facility]  PATIENT: Laura [name] DOB: 1965-05-17  REFERRING CLINICIAN: Delane Ginger, MD HISTORY FROM: patient  REASON FOR VISIT: new consult    HISTORICAL  CHIEF COMPLAINT:  Chief Complaint  Patient presents with  . NP Dr. Gaye Alken  . Persistent Severe Headaches.    has had all her life,  Been on Ibuprofen, excedrin migraine, sumatriptan, gabapetin, has n/v, with phono and photo sensitivity., smells,.  Fatigue. (can't sleep)    HISTORY OF PRESENT ILLNESS:   52 year old female here for evaluation of headaches.  Patient reports occipital, global, left greater than right-sided throbbing and pressure headaches with nausea, photophobia, phonophobia.  Patient averaging more than 15 days of headache per month.  Triggering factors include decreased sleep, anxiety or weather changes.  Patient has tried ibuprofen, Excedrin Migraine, Imitrex, gabapentin without relief.  She is not sure if she is tried topiramate or amitriptyline.  She has not tried propranolol.  Patient also has some insomnia, anxiety, racing thoughts, and recently started on Paxil and clonazepam.  Patient has family history of migraine in her sister and father.  Patient also has some menopausal symptoms, and her OB/GYN is considering hormone replacement therapy, but wanted neurologic consultation before starting this.  Patient has remote history of possible left vertebral artery dissection in 2011, but follow-up testing and angiography was negative for dissection or aneurysm.   REVIEW OF SYSTEMS: Full 14 system review of systems performed and negative with exception of: Fevers chills fatigue feeling hot joint pain aching muscles headache numbness insomnia sleepiness restless legs anxiety.   ALLERGIES: Allergies  Allergen Reactions  . Aspirin     REACTION: swelling    HOME MEDICATIONS: Outpatient Medications Prior to Visit  Medication Sig Dispense Refill  . clonazePAM (KLONOPIN) 0.5 MG  tablet Take 0.5 mg by mouth at bedtime.    Marland Kitchen ibuprofen (ADVIL,MOTRIN) 200 MG tablet Take 400 mg by mouth every 6 (six) hours as needed.    . nitrofurantoin (MACRODANTIN) 100 MG capsule Take 100 mg by mouth 2 (two) times daily.    Marland Kitchen PARoxetine (PAXIL) 10 MG tablet Take 10 mg by mouth daily.    Marland Kitchen gabapentin (NEURONTIN) 300 MG capsule Take 300 mg by mouth every morning.     . mometasone (NASONEX) 50 MCG/ACT nasal spray Place 2 sprays into the nose daily. 17 g 12   No facility-administered medications prior to visit.     PAST MEDICAL HISTORY: Past Medical History:  Diagnosis Date  . Anxiety   . Fibromyalgia   . Migraine   . Tobacco abuse   . Vertebral artery disease (HCC)    history of left vertebral artery dissection 2011    PAST SURGICAL HISTORY: Past Surgical History:  Procedure Laterality Date  . ABDOMINAL HYSTERECTOMY    . BACK SURGERY  2008  . deliveries      [phone]  . thumb surgery Left 2018   [facility]  . WRIST SURGERY  1994    FAMILY HISTORY: No family history on file.  SOCIAL HISTORY:  Social History   Socioeconomic History  . Marital status: Married    Spouse name: Not on file  . Number of children: Not on file  . Years of education: Not on file  . Highest education level: Not on file  Social Needs  . Financial resource strain: Not on file  . Food insecurity - worry: Not on file  . Food insecurity - inability: Not on file  . Transportation needs - medical: Not  on file  . Transportation needs - non-medical: Not on file  Occupational History  . Not on file  Tobacco Use  . Smoking status: Former Smoker    Packs/day: 1.00    Types: Cigarettes  . Smokeless tobacco: Never Used  Substance and Sexual Activity  . Alcohol use: No    Alcohol/week: 0.0 oz  . Drug use: No  . Sexual activity: Not on file  Other Topics Concern  . Not on file  Social History Narrative   Married,  3 kids.   Lives home with husband,  Works at [employer].  Education 12th grade.   Caffeine pepsi daily and tea daily.   '     PHYSICAL EXAM  GENERAL EXAM/CONSTITUTIONAL: Vitals:  Vitals:   04/13/17 1026  BP: 137/78  Pulse: 81  Weight: 125 lb 12.8 oz (57.1 kg)  Height: 5\' 3"  (1.6 m)     Body mass index is 22.28 kg/m.  Visual Acuity Screening   Right eye Left eye Both eyes  Without correction: 20/40 20/40   With correction:        Patient is in no distress; well developed, nourished and groomed; neck is supple  CARDIOVASCULAR:  Examination of carotid arteries is normal; no carotid bruits  Regular rate and rhythm, no murmurs  Examination of peripheral vascular system by observation and palpation is normal  EYES:  Ophthalmoscopic exam of optic discs and posterior segments is normal; no papilledema or hemorrhages  MUSCULOSKELETAL:  Gait, strength, tone, movements noted in Neurologic exam below  NEUROLOGIC: MENTAL STATUS:  No flowsheet data found.  awake, alert, oriented to person, place and time  recent and remote memory intact  normal attention and concentration  language fluent, comprehension intact, naming intact,   fund of knowledge appropriate  CRANIAL NERVE:   2nd - no papilledema on fundoscopic exam  2nd, 3rd, 4th, 6th - pupils equal and reactive to light, visual fields full to confrontation, extraocular muscles intact, no nystagmus  5th - facial sensation symmetric  7th - facial strength symmetric  8th - hearing intact  9th - palate elevates symmetrically, uvula midline  11th - shoulder shrug symmetric  12th - tongue protrusion midline  MOTOR:   normal bulk and tone, full strength in the BUE, BLE  SENSORY:   normal and symmetric to light touch, temperature, vibration  COORDINATION:   finger-nose-finger, fine finger movements normal  REFLEXES:   deep tendon reflexes present and symmetric  GAIT/STATION:   narrow based gait    DIAGNOSTIC DATA (LABS, IMAGING, TESTING) - I reviewed patient records,  labs, notes, testing and imaging myself where available.  Lab Results  Component Value Date   WBC 7.2 05/01/2014   HGB 13.0 05/01/2014   HCT 37.0 05/01/2014   MCV 93.0 05/01/2014   PLT 239 05/01/2014      Component Value Date/Time   NA 138 05/01/2014 0800   K 3.4 (L) 05/01/2014 0800   CL 106 05/01/2014 0800   CO2 27 05/01/2014 0800   GLUCOSE 89 05/01/2014 0800   BUN 5 (L) 05/01/2014 0800   CREATININE 0.78 05/01/2014 0800   CALCIUM 9.0 05/01/2014 0800   GFRNONAA >90 05/01/2014 0800   GFRAA >90 05/01/2014 0800   Lab Results  Component Value Date   CHOL  10/29/2009    184        ATP III CLASSIFICATION:  <200     mg/dL   Desirable  [id number]  mg/dL   Borderline High  >=119>=240  mg/dL   High          HDL 40 10/29/2009   LDLCALC (H) 10/29/2009    122        Total Cholesterol/HDL:CHD Risk Coronary Heart Disease Risk Table                     Men   Women  1/2 Average Risk   3.4   3.3  Average Risk       5.0   4.4  2 X Average Risk   9.6   7.1  3 X Average Risk  23.4   11.0        Use the calculated Patient Ratio above and the CHD Risk Table to determine the patient's CHD Risk.        ATP III CLASSIFICATION (LDL):  <100     mg/dL   Optimal  536-644  mg/dL   Near or Above                    Optimal  130-159  mg/dL   Borderline  034-742  mg/dL   High  >595     mg/dL   Very High   TRIG 638 10/29/2009   CHOLHDL 4.6 10/29/2009   Lab Results  Component Value Date   HGBA1C (L) 10/29/2009    4.5 (NOTE)                                                                       According to the ADA Clinical Practice Recommendations for 2011, when HbA1c is used as a screening test:   >=6.5%   Diagnostic of Diabetes Mellitus           (if abnormal result  is confirmed)  5.7-6.4%   Increased risk of developing Diabetes Mellitus  References:Diagnosis and Classification of Diabetes Mellitus,Diabetes Care,2011,34(Suppl 1):S62-S69 and Standards of Medical Care in         Diabetes -  2011,Diabetes Care,2011,34  (Suppl 1):S11-S61.   No results found for: VITAMINB12 No results found for: TSH   10/29/09 MRI brain "No acute intracranial findings.  No evidence for posterior fossa Ischemia."  10/29/09 MRA head  "Focal vertebral artery dissection and its V3 segment.  No distal emboli or significant stenosis on the left.  75 than right C stenosis distal right vertebral.  Probable 2 mm infundibulum, right posterior communicating artery."  10/28/09 CTA head 1.  Intracranial posterior circulation appears within normal limits aside from anatomic variants.  2.  Negative anterior circulation. 3. Normal CT appearance of the brain.  10/28/09 CTA neck 1.  Focal irregularity in the left distal vertebral artery V3 segment (series 606 image 132) is highly suspicious for focal vertebral artery dissection in this setting.  No subsequent hemodynamically significant stenosis and there is preserved distal flow. 2.  Normal nondominant right vertebral artery and visualized cervical carotids; no atherosclerosis. 3.  Intracranial findings are below.  05/01/14 cerebral angiogram 1. Fenestration of the right supraclinoid internal carotid artery, with origin of the posterior communicating artery arising from the fenestration.  There is no aneurysm identified. 2. No evidence of cervical left vertebral artery dissection, pseudoaneurysm, or stenosis.     ASSESSMENT AND PLAN  51  y.o. year old female here with chronic migraine without aura and episodic migraine with aura.  Dx:  1. Chronic migraine without aura with status migrainosus, not intractable   2. Migraine with aura and with status migrainosus, not intractable      PLAN:  MIGRAINE WITH AURA - start topiramate 50mg  at bedtime; after 1 week increase to twice a day; drink plenty of water - rizatriptan 10mg  as needed for breakthrough headache; may repeat x 1 after 2 hours; max 2 tabs per day or 8 per month - consider migraine  infusion  ANXIETY/INSOMNIA - continue paxil, clonazepam per Dr. Gaye Alken - consider therapist/counselor - improve sleep hygiene  MENOPAUSE SYMPTOMS - no neurologic contraindication for hormone replacement therapy (patient has not has a stroke in the past)  Meds ordered this encounter  Medications  . topiramate (TOPAMAX) 50 MG tablet    Sig: Take 1 tablet (50 mg total) by mouth 2 (two) times daily.    Dispense:  60 tablet    Refill:  12  . rizatriptan (MAXALT-MLT) 10 MG disintegrating tablet    Sig: Take 1 tablet (10 mg total) by mouth as needed for migraine. May repeat in 2 hours if needed    Dispense:  9 tablet    Refill:  11   Return in about 6 months (around 10/14/2017).    Suanne Marker, MD 04/13/2017, 10:33 AM Certified in Neurology, Neurophysiology and Neuroimaging  [facility] [address] [place], [place] [address] [phone]

## 2017-04-13 NOTE — Patient Instructions (Signed)
MIGRAINE WITH AURA - start topiramate 50mg  at bedtime; after 1 week increase to twice a day; drink plenty of water  - rizatriptan 10mg  as needed for breakthrough headache; may repeat x 1 after 2 hours; max 2 tabs per day or 8 per month  - may return for migraine infusion if needed  ANXIETY/INSOMNIA - continue paxil, clonazepam per Dr. Arelia SneddonMcComb - consider therapist/counselor - improve sleep hygiene --> [email or web address]  MENOPAUSE SYMPTOMS - ok to consider hormone replacement therapy from neurology standpoint

## 2017-04-26 ENCOUNTER — Telehealth: Payer: Self-pay | Admitting: Diagnostic Neuroimaging

## 2017-04-26 NOTE — Telephone Encounter (Signed)
Spoke with patient and informed her of Dr Visteon CorporationPenumalli's instructions and recommendations. She stated she will stop topamax. She verbalized understanding of instructions. She has not had infusion in past; this RN explained each medication, advised she must have driver if she receives Compazne.  Advised she call for any problems, questions. Patient verbalized understanding, appreciation.

## 2017-04-26 NOTE — Telephone Encounter (Signed)
Spoke with  Patient who stated when taking Topiramate her eyes can't focus, she feels tired and loopy. She stopped taking topiramate and rizatriptan on Fri. She had already begun taking Topriamate 50 mg twice daily at that time. She has taken total of 7 rizatriptan since 04/13/17, states no relief. She is concerned about drug interactions of topiramate and rizatriptan with her trazadone and Paxil. She stated that today she "feels a headache coming on". She stated her feet hardly move when she tries to walk due to being tired. This RN advised will discuss with Dr Marjory LiesPenumalli and call her back later today. She verbalized understanding, appreciation.

## 2017-04-26 NOTE — Telephone Encounter (Signed)
-   stop topiramate - continue paxil and trazodone to stabilize anxiety and sleep issues - may consider restarting topiramate in 2-4 weeks - may come in for migraine infusion (depacon, toradol, compazine) if needed   Suanne MarkerVIKRAM R. PENUMALLI, MD 04/26/2017, 1:13 PM Certified in Neurology, Neurophysiology and Neuroimaging  [facility] [address], [address] [place], [place] [address] [phone]

## 2017-04-26 NOTE — Telephone Encounter (Signed)
Patient called regarding her medications. She says that she recently began topamax and rizatriptan, since beginning the medication she is having vision issue and she is also having headaches still. Another doctor recently put her on Paxil and Trazidone as well.  She would like to speak with someone regarding this. Please call and advise.

## 2017-07-07 ENCOUNTER — Emergency Department (HOSPITAL_COMMUNITY)
Admission: EM | Admit: 2017-07-07 | Discharge: 2017-07-08 | Disposition: A | Discharge status: Home / Self Care | Payer: Managed Care, Other (non HMO) | Attending: Emergency Medicine | Admitting: Emergency Medicine

## 2017-07-07 ENCOUNTER — Encounter (HOSPITAL_COMMUNITY): Payer: Self-pay | Admitting: Emergency Medicine

## 2017-07-07 DIAGNOSIS — R51 Headache: Secondary | ICD-10-CM | POA: Insufficient documentation

## 2017-07-07 DIAGNOSIS — R519 Headache, unspecified: Secondary | ICD-10-CM

## 2017-07-07 DIAGNOSIS — Z87891 Personal history of nicotine dependence: Secondary | ICD-10-CM | POA: Diagnosis not present

## 2017-07-07 DIAGNOSIS — M62838 Other muscle spasm: Secondary | ICD-10-CM | POA: Insufficient documentation

## 2017-07-07 DIAGNOSIS — Z79899 Other long term (current) drug therapy: Secondary | ICD-10-CM | POA: Insufficient documentation

## 2017-07-07 LAB — CBC
HCT: 43.4 % (ref 36.0–46.0)
HEMOGLOBIN: 15 g/dL (ref 12.0–15.0)
MCH: 33 pg (ref 26.0–34.0)
MCHC: 34.6 g/dL (ref 30.0–36.0)
MCV: 95.4 fL (ref 78.0–100.0)
PLATELETS: 361 10*3/uL (ref 150–400)
RBC: 4.55 MIL/uL (ref 3.87–5.11)
RDW: 13.7 % (ref 11.5–15.5)
WBC: 5.6 10*3/uL (ref 4.0–10.5)

## 2017-07-07 LAB — COMPREHENSIVE METABOLIC PANEL
ALBUMIN: 5 g/dL (ref 3.5–5.0)
ALT: 17 U/L (ref 14–54)
AST: 27 U/L (ref 15–41)
Alkaline Phosphatase: 79 U/L (ref 38–126)
Anion gap: 9 (ref 5–15)
BUN: 10 mg/dL (ref 6–20)
CO2: 28 mmol/L (ref 22–32)
CREATININE: 0.98 mg/dL (ref 0.44–1.00)
Calcium: 10.2 mg/dL (ref 8.9–10.3)
Chloride: 102 mmol/L (ref 101–111)
GFR calc Af Amer: >60 mL/min (ref >60)
GFR calc non Af Amer: >60 mL/min (ref >60)
Glucose, Bld: 96 mg/dL (ref 65–99)
Potassium: 4.7 mmol/L (ref 3.5–5.1)
SODIUM: 139 mmol/L (ref 135–145)
Total Bilirubin: 0.8 mg/dL (ref 0.3–1.2)
Total Protein: 8.1 g/dL (ref 6.5–8.1)

## 2017-07-07 LAB — LIPASE, BLOOD: Lipase: 27 U/L (ref 11–51)

## 2017-07-07 NOTE — ED Triage Notes (Signed)
Pt reports migraine x 3 days with n/v. Reports abd cramping and muscles cramps all over body for couple days as well.

## 2017-07-08 ENCOUNTER — Other Ambulatory Visit: Payer: Self-pay

## 2017-07-08 LAB — URINALYSIS, ROUTINE W REFLEX MICROSCOPIC
Bilirubin Urine: NEGATIVE
GLUCOSE, UA: NEGATIVE mg/dL
HGB URINE DIPSTICK: NEGATIVE
Ketones, ur: NEGATIVE mg/dL
Leukocytes, UA: NEGATIVE
Nitrite: NEGATIVE
Protein, ur: NEGATIVE mg/dL
SPECIFIC GRAVITY, URINE: 1.005 (ref 1.005–1.030)
pH: 6 (ref 5.0–8.0)

## 2017-07-08 LAB — CK: Total CK: 188 U/L (ref 38–234)

## 2017-07-08 MED ORDER — DEXAMETHASONE SODIUM PHOSPHATE 10 MG/ML IJ SOLN
10.0000 mg | Freq: Once | INTRAMUSCULAR | Status: AC
  Administered 2017-07-08: 10 mg via INTRAVENOUS
  Filled 2017-07-08: qty 1

## 2017-07-08 MED ORDER — BUTALBITAL-APAP-CAFFEINE 50-325-40 MG PO TABS: 1.0000 | ORAL_TABLET | Freq: Four times a day (QID) | ORAL | 0 refills | Status: DC | PRN

## 2017-07-08 MED ORDER — SODIUM CHLORIDE 0.9 % IV BOLUS
1000.0000 mL | Freq: Once | INTRAVENOUS | Status: AC
  Administered 2017-07-08: 1000 mL via INTRAVENOUS

## 2017-07-08 MED ORDER — DIPHENHYDRAMINE HCL 50 MG/ML IJ SOLN
25.0000 mg | Freq: Once | INTRAMUSCULAR | Status: AC
  Administered 2017-07-08: 25 mg via INTRAVENOUS
  Filled 2017-07-08: qty 1

## 2017-07-08 MED ORDER — METOCLOPRAMIDE HCL 5 MG/ML IJ SOLN
10.0000 mg | Freq: Once | INTRAMUSCULAR | Status: AC
  Administered 2017-07-08: 10 mg via INTRAVENOUS
  Filled 2017-07-08: qty 2

## 2017-07-08 NOTE — ED Provider Notes (Signed)
[facility]   CSN: [id number] Arrival date & time: 07/07/17  1858     History   Chief Complaint Chief Complaint  Patient presents with  . Migraine  . Abdominal Cramping  . muscle cramping    HPI Laura [name] is a 52 y.o. female.  The history is provided by the patient and medical records.  Migraine  Associated symptoms include headaches.  Abdominal Cramping  Associated symptoms include headaches.     52 y.o. F with hx of anxiety, fibromyalgia, migraine headaches, vertebral artery disease (hx of dissection in 2011), presenting to the ED for migraine headache.  States this has been ongoing for about 3 days now.  Headache is generalized throughout her entire head, pulsatile type pain.  Associated photophobia, nausea, and vomiting.  She denies any dizziness, confusion, focal numbness or weakness, blurred vision, etc.  She has been trying "everything" for her headache without any relief-- leftover oxycodone from surgery, zofran, ibuprofen, tylenol, muscle relaxer, etc.  States it feels like her typical migraine.  No fever, chills, neck pain.  Was previously on medications for migraines, however had several adverse reactions so unable to tolerate.  She also reports some generalized muscle cramping, has been getting a lot of "charley horses" in her legs and in her hips.  She is not exactly sure why this keeps happening.  She denies any injury, trauma, or falls.  No numbness or weakness of her extremities.  No difficulty walking.    Past Medical History:  Diagnosis Date  . Anxiety   . Fibromyalgia   . Migraine   . Tobacco abuse   . Vertebral artery disease (HCC)    history of left vertebral artery dissection 2011    Patient Active Problem List   Diagnosis Date Noted  . Tobacco abuse 05/18/2014  . Vertebral artery disease (HCC) 05/18/2014  . Heel pain 06/12/2010  . ACHILLES TENDINITIS 01/07/2010  . DYSURIA 01/07/2010  .  MIGRAINE HEADACHE 11/14/2009  . DISC DISEASE, CERVICAL 11/14/2009    Past Surgical History:  Procedure Laterality Date  . ABDOMINAL HYSTERECTOMY    . BACK SURGERY  2008  . deliveries      [phone]  . thumb surgery Left 2018   [facility]  . WRIST SURGERY  1994     OB History   None      Home Medications    Prior to Admission medications   Medication Sig Start Date End Date Taking? Authorizing Provider  clonazePAM (KLONOPIN) 0.5 MG tablet Take 0.5 mg by mouth at bedtime.    [provider]  ibuprofen (ADVIL,MOTRIN) 200 MG tablet Take 400 mg by mouth every 6 (six) hours as needed.    [provider]  nitrofurantoin (MACRODANTIN) 100 MG capsule Take 100 mg by mouth 2 (two) times daily.    [provider]  PARoxetine (PAXIL) 10 MG tablet Take 10 mg by mouth daily.    [provider]  rizatriptan (MAXALT-MLT) 10 MG disintegrating tablet Take 1 tablet (10 mg total) by mouth as needed for migraine. May repeat in 2 hours if needed 04/13/17   Penumalli, Glenford BayleyVikram R, MD  topiramate (TOPAMAX) 50 MG tablet Take 1 tablet (50 mg total) by mouth 2 (two) times daily. 04/13/17   Penumalli, Glenford BayleyVikram R, MD  traZODone (DESYREL) 100 MG tablet Take 100 mg by mouth at bedtime.    [provider]    Family History Family History  Problem Relation Age of Onset  .  Lung cancer Father   . Breast cancer Sister     Social History Social History   Tobacco Use  . Smoking status: Former Smoker    Packs/day: 1.00    Types: Cigarettes  . Smokeless tobacco: Never Used  Substance Use Topics  . Alcohol use: No    Alcohol/week: 0.0 oz  . Drug use: No     Allergies   Aspirin   Review of Systems Review of Systems  Neurological: Positive for headaches.  All other systems reviewed and are negative.    Physical Exam Updated Vital Signs BP (!) 120/93 (BP Location: Left Arm)   Pulse 100   Temp 98.1 F (36.7 C) (Oral)   Resp 19   Ht 5\' 3"  (1.6 m)   Wt 56.7 kg  (125 lb)   SpO2 98%   BMI 22.14 kg/m   Physical Exam  Constitutional: She is oriented to person, place, and time. She appears well-developed and well-nourished. No distress.  HENT:  Head: Normocephalic and atraumatic.  Right Ear: External ear normal.  Left Ear: External ear normal.  Mouth/Throat: Oropharynx is clear and moist.  Eyes: Pupils are equal, round, and reactive to light. Conjunctivae and EOM are normal.  Neck: Normal range of motion and full passive range of motion without pain. Neck supple. No neck rigidity.  No rigidity, no meningismus  Cardiovascular: Normal rate, regular rhythm and normal heart sounds.  No murmur heard. Pulmonary/Chest: Effort normal and breath sounds normal. No respiratory distress. She has no wheezes. She has no rhonchi.  Abdominal: Soft. Bowel sounds are normal. There is no tenderness. There is no guarding.  Musculoskeletal: Normal range of motion. She exhibits no edema.  Extremities are atraumatic without visible swelling, overlying skin changes, or palpable cords; ambulatory with steady gait  Neurological: She is alert and oriented to person, place, and time. She has normal strength. She displays no tremor. No cranial nerve deficit or sensory deficit. She displays no seizure activity.  AAOx3, answering questions and following commands appropriately; equal strength UE and LE bilaterally; CN grossly intact; moves all extremities appropriately without ataxia; no focal neuro deficits or facial asymmetry appreciated; gait observed and is steady  Skin: Skin is warm and dry. No rash noted. She is not diaphoretic.  No rashes or other skin abnormalities  Psychiatric: She has a normal mood and affect. Her behavior is normal. Thought content normal.  Nursing note and vitals reviewed.    ED Treatments / Results  Labs (all labs ordered are listed, but only abnormal results are displayed) Labs Reviewed  URINALYSIS, ROUTINE W REFLEX MICROSCOPIC - Abnormal;  Notable for the following components:      Result Value   Color, Urine STRAW (*)    All other components within normal limits  LIPASE, BLOOD  COMPREHENSIVE METABOLIC PANEL  CBC  CK    EKG None  Radiology No results found.  Procedures Procedures (including critical care time)  Medications Ordered in ED Medications  diphenhydrAMINE (BENADRYL) injection 25 mg (25 mg Intravenous Given 07/08/17 0016)  metoCLOPramide (REGLAN) injection 10 mg (10 mg Intravenous Given 07/08/17 0016)  dexamethasone (DECADRON) injection 10 mg (10 mg Intravenous Given 07/08/17 0016)  sodium chloride 0.9 % bolus 1,000 mL (0 mLs Intravenous Stopped 07/08/17 0043)     Initial Impression / Assessment and Plan / ED Course  I have reviewed the triage vital signs and the nursing notes.  Pertinent labs & imaging results that were available during my care of the patient  were reviewed by me and considered in my medical decision making (see chart for details).  52 year old female here with migraine headache with associated photophobia, nausea, and vomiting.  Has long-standing history of migraines, reports this is typical for her.  She is afebrile and nontoxic.  No focal neurologic deficits.  No signs or symptoms concerning for meningitis.  Also reports some generalized cramping, especially in her legs and hips which she describes as "charley horses".  Labs were obtained, overall reassuring.  Potassium specifically is normal.  Will add CK.  Given IV fluids and migraine cocktail.  3:07 AM Patient feeling much better after migraine cocktail, resting comfortably.  No active emesis here in the ED.  Muscle spasms have improved as well.  Remains without any focal neurologic deficits.  At this time, uncertain etiology of her diffuse bodily cramping as potassium and CK are normal.  No signs of DVT on exam.  No rashes suggestive of shingles.  Discussed continuing good oral hydration, potassium rich foods like bananas, orange juice,  etc.  Close follow-up with PCP-- patient expressed some unhappiness with current PCP, given information for some local clinics.  Rx few fioricet for home use PRN.  Discussed plan with patient, she acknowledged understanding and agreed with plan of care.  Return precautions given for new or worsening symptoms.  Final Clinical Impressions(s) / ED Diagnoses   Final diagnoses:  Bad headache    ED Discharge Orders        Ordered    butalbital-acetaminophen-caffeine (FIORICET, ESGIC) 50-325-40 MG tablet  Every 6 hours PRN     07/08/17 0312       Garlon Hatchet, [name] 07/08/17 0507    Dione Booze, MD 07/08/17 [phone]

## 2017-07-08 NOTE — Discharge Instructions (Signed)
Take the prescribed medication as directed for headache that does not respond to tylenol or motrin. Follow-up with your primary care doctor.  If you would like to see a new doctor, information for local offices listed for you as well. Return to the ED for new or worsening symptoms-- numbness, weakness, confusion, dizziness, facial droop, trouble walking, high fever, etc.

## 2017-07-08 NOTE — ED Notes (Signed)
Discharge instructions reviewed with pt. Pt verbalized understanding. PIV removed. Pt to follow up with PCP. Pt ambulatory to waiting room for husband to pick her up.

## 2017-10-18 ENCOUNTER — Ambulatory Visit: Payer: Managed Care, Other (non HMO) | Admitting: Diagnostic Neuroimaging

## 2017-10-18 ENCOUNTER — Telehealth: Payer: Self-pay | Admitting: *Deleted

## 2017-10-18 NOTE — Telephone Encounter (Signed)
Pt no showed for appt today.  

## 2017-10-20 ENCOUNTER — Encounter: Payer: Self-pay | Admitting: Diagnostic Neuroimaging

## 2018-02-22 ENCOUNTER — Encounter (HOSPITAL_COMMUNITY): Payer: Self-pay

## 2018-02-22 ENCOUNTER — Emergency Department (HOSPITAL_COMMUNITY)
Admission: EM | Admit: 2018-02-22 | Discharge: 2018-02-23 | Disposition: A | Discharge status: Home / Self Care | Payer: BLUE CROSS/BLUE SHIELD | Attending: Emergency Medicine | Admitting: Emergency Medicine

## 2018-02-22 DIAGNOSIS — R51 Headache: Secondary | ICD-10-CM | POA: Insufficient documentation

## 2018-02-22 DIAGNOSIS — G44219 Episodic tension-type headache, not intractable: Secondary | ICD-10-CM | POA: Diagnosis not present

## 2018-02-22 DIAGNOSIS — E876 Hypokalemia: Secondary | ICD-10-CM

## 2018-02-22 DIAGNOSIS — R5383 Other fatigue: Secondary | ICD-10-CM | POA: Insufficient documentation

## 2018-02-22 DIAGNOSIS — Z79899 Other long term (current) drug therapy: Secondary | ICD-10-CM | POA: Insufficient documentation

## 2018-02-22 DIAGNOSIS — Z87891 Personal history of nicotine dependence: Secondary | ICD-10-CM | POA: Insufficient documentation

## 2018-02-22 DIAGNOSIS — R030 Elevated blood-pressure reading, without diagnosis of hypertension: Secondary | ICD-10-CM | POA: Diagnosis not present

## 2018-02-22 LAB — CBC
HEMATOCRIT: 37.3 % (ref 36.0–46.0)
Hemoglobin: 12.9 g/dL (ref 12.0–15.0)
MCH: 31.7 pg (ref 26.0–34.0)
MCHC: 34.6 g/dL (ref 30.0–36.0)
MCV: 91.6 fL (ref 80.0–100.0)
NRBC: 0 % (ref 0.0–0.2)
PLATELETS: 349 10*3/uL (ref 150–400)
RBC: 4.07 MIL/uL (ref 3.87–5.11)
RDW: 12.7 % (ref 11.5–15.5)
WBC: 7 10*3/uL (ref 4.0–10.5)

## 2018-02-22 LAB — BASIC METABOLIC PANEL
Anion gap: 9 (ref 5–15)
BUN: 5 mg/dL — AB (ref 6–20)
CALCIUM: 9 mg/dL (ref 8.9–10.3)
CO2: 25 mmol/L (ref 22–32)
CREATININE: 0.72 mg/dL (ref 0.44–1.00)
Chloride: 106 mmol/L (ref 98–111)
GFR calc non Af Amer: >60 mL/min (ref >60)
Glucose, Bld: 94 mg/dL (ref 70–99)
Potassium: 3 mmol/L — ABNORMAL LOW (ref 3.5–5.1)
SODIUM: 140 mmol/L (ref 135–145)

## 2018-02-22 MED ORDER — SODIUM CHLORIDE 0.9% FLUSH: 3.0000 mL | Freq: Once | INTRAVENOUS | Status: DC

## 2018-02-22 NOTE — ED Triage Notes (Signed)
Patient complains of right arm discomfort, bilateral leg weakness, facial tingling bilateral x 2 days. States that she just finished augmentin for dental infection, no neuro deficits, alert and oriented

## 2018-02-23 LAB — I-STAT TROPONIN, ED: Troponin i, poc: 0 ng/mL (ref 0.00–0.08)

## 2018-02-23 MED ORDER — POTASSIUM CHLORIDE CRYS ER 20 MEQ PO TBCR
40.0000 meq | EXTENDED_RELEASE_TABLET | Freq: Once | ORAL | Status: AC
  Administered 2018-02-23: 40 meq via ORAL
  Filled 2018-02-23: qty 2

## 2018-02-23 MED ORDER — KETOROLAC TROMETHAMINE 60 MG/2ML IM SOLN
30.0000 mg | Freq: Once | INTRAMUSCULAR | Status: AC
  Administered 2018-02-23: 30 mg via INTRAMUSCULAR
  Filled 2018-02-23: qty 2

## 2018-02-23 MED ORDER — ACETAMINOPHEN 500 MG PO TABS
1000.0000 mg | ORAL_TABLET | Freq: Once | ORAL | Status: AC
  Administered 2018-02-23: 1000 mg via ORAL
  Filled 2018-02-23: qty 2

## 2018-02-23 MED ORDER — POTASSIUM CHLORIDE ER 10 MEQ PO TBCR: 10.0000 meq | EXTENDED_RELEASE_TABLET | Freq: Two times a day (BID) | ORAL | 0 refills | Status: DC

## 2018-02-23 NOTE — ED Notes (Signed)
Patient verbalizes understanding of discharge instructions. Opportunity for questioning and answers were provided. Armband removed by staff, pt discharged from ED ambulatory.   

## 2018-02-23 NOTE — ED Notes (Signed)
ED Provider at bedside. 

## 2018-02-23 NOTE — ED Provider Notes (Signed)
[facility] EMERGENCY DEPARTMENT Provider Note  CSN: [id number] Arrival date & time: 02/22/18 1634  Chief Complaint(s) headache, legs heavy  HPI Laura [name] is a 53 y.o. female with a history of migraines, anxiety, fibromyalgia who presents to the emergency department with 1 month of generalized fatigue with intermittent lower extremity weakness.  She states that she has difficulty standing up from a kneeling position.  There is no alleviating or aggravating factors that she knows of.  She denies any trauma.  No bladder/bowel incontinence.  No lower back pain.  No loss of sensation.  Additionally patient endorses typical migraine type headaches for the past couple of days.  She also endorses nasal congestion and dry cough for approximately 1 week.  She denies any fevers or chills.  She endorses intermittent right upper extremity discomfort that radiates to the chest.  These are brief episodes without known triggers or alleviating factors.  No associated shortness of breath.  No lower extremity edema.  Patient reports that she was treated in early December with Augmentin for a possible dental abscess.   HPI  Past Medical History Past Medical History:  Diagnosis Date  . Anxiety   . Fibromyalgia   . Migraine   . Tobacco abuse   . Vertebral artery disease (HCC)    history of left vertebral artery dissection 2011   Patient Active Problem List   Diagnosis Date Noted  . Tobacco abuse 05/18/2014  . Vertebral artery disease (HCC) 05/18/2014  . Heel pain 06/12/2010  . ACHILLES TENDINITIS 01/07/2010  . DYSURIA 01/07/2010  . MIGRAINE HEADACHE 11/14/2009  . DISC DISEASE, CERVICAL 11/14/2009   Home Medication(s) Prior to Admission medications   Medication Sig Start Date End Date Taking? Authorizing Provider  butalbital-acetaminophen-caffeine (FIORICET, ESGIC) [phone] MG tablet Take 1 tablet by mouth every 6 (six) hours as needed for headache. 07/08/17 07/08/18  Garlon Hatchet, [name]  clonazePAM (KLONOPIN) 0.5 MG tablet Take 1 mg by mouth at bedtime.     [provider]  PARoxetine (PAXIL) 10 MG tablet Take 10 mg by mouth daily.    [provider]  potassium chloride (K-DUR) 10 MEQ tablet Take 1 tablet (10 mEq total) by mouth 2 (two) times daily for 14 days. 02/23/18 03/09/18  Nira Conn, MD  rizatriptan (MAXALT-MLT) 10 MG disintegrating tablet Take 1 tablet (10 mg total) by mouth as needed for migraine. May repeat in 2 hours if needed Patient not taking: Reported on 07/08/2017 04/13/17   Penumalli, Glenford Bayley, MD  topiramate (TOPAMAX) 50 MG tablet Take 1 tablet (50 mg total) by mouth 2 (two) times daily. Patient not taking: Reported on 07/08/2017 04/13/17   Suanne Marker, MD                                                                                                                                    Past Surgical History Past  Surgical History:  Procedure Laterality Date  . ABDOMINAL HYSTERECTOMY    . BACK SURGERY  2008  . deliveries      [phone]  . thumb surgery Left 2018   [facility]  . WRIST SURGERY  1994   Family History Family History  Problem Relation Age of Onset  . Lung cancer Father   . Breast cancer Sister     Social History Social History   Tobacco Use  . Smoking status: Former Smoker    Packs/day: 1.00    Types: Cigarettes  . Smokeless tobacco: Never Used  Substance Use Topics  . Alcohol use: No    Alcohol/week: 0.0 standard drinks  . Drug use: No   Allergies Aspirin  Review of Systems Review of Systems All other systems are reviewed and are negative for acute change except as noted in the HPI  Physical Exam Vital Signs  I have reviewed the triage vital signs BP (!) 153/94 (BP Location: Left Arm)   Pulse 71   Temp 98.2 F (36.8 C) (Oral)   Resp 16   SpO2 97%   Physical Exam Vitals signs reviewed.  Constitutional:      General: She is not in acute distress.    Appearance: She is  well-developed. She is not diaphoretic.  HENT:     Head: Normocephalic and atraumatic.     Nose: Nose normal.     Mouth/Throat:     Dentition: Abnormal dentition.      Comments: Upper dentures.   Eyes:     General: No scleral icterus.       Right eye: No discharge.        Left eye: No discharge.     Conjunctiva/sclera: Conjunctivae normal.     Pupils: Pupils are equal, round, and reactive to light.  Neck:     Musculoskeletal: Normal range of motion and neck supple.  Cardiovascular:     Rate and Rhythm: Normal rate and regular rhythm.     Heart sounds: No murmur. No friction rub. No gallop.   Pulmonary:     Effort: Pulmonary effort is normal. No respiratory distress.     Breath sounds: Normal breath sounds. No stridor. No rales.  Abdominal:     General: There is no distension.     Palpations: Abdomen is soft.     Tenderness: There is no abdominal tenderness.  Musculoskeletal:        General: No tenderness.  Skin:    General: Skin is warm and dry.     Findings: No erythema or rash.  Neurological:     Mental Status: She is alert and oriented to person, place, and time.     Cranial Nerves: Cranial nerves are intact.     Sensory: Sensation is intact.     Motor: Motor function is intact.     Comments: Spine Exam: Strength: 5/5 throughout LE bilaterally (hip flexion/extension, adduction/abduction; knee flexion/extension; foot dorsiflexion/plantarflexion, inversion/eversion; great toe inversion) Sensation: Intact to light touch in proximal and distal LE bilaterally Reflexes: 2+ quadriceps and achilles reflexes      ED Results and Treatments Labs (all labs ordered are listed, but only abnormal results are displayed) Labs Reviewed  BASIC METABOLIC PANEL - Abnormal; Notable for the following components:      Result Value   Potassium 3.0 (*)    BUN 5 (*)    All other components within normal limits  CBC  I-STAT TROPONIN, ED  EKG  EKG Interpretation  Date/Time:  Tuesday February 22 2018 17:07:37 EST Ventricular Rate:  90 PR Interval:  158 QRS Duration: 86 QT Interval:  370 QTC Calculation: 452 R Axis:   56 Text Interpretation:  Normal sinus rhythm Normal ECG No significant change since last tracing Confirmed by Drema Pry [phone]) on 02/23/2018 1:02:11 AM      Radiology No results found. Pertinent labs & imaging results that were available during my care of the patient were reviewed by me and considered in my medical decision making (see chart for details).  Medications Ordered in ED Medications  sodium chloride flush (NS) 0.9 % injection 3 mL (0 mLs Intravenous Hold 02/23/18 0029)  acetaminophen (TYLENOL) tablet 1,000 mg (1,000 mg Oral Given 02/23/18 0118)  ketorolac (TORADOL) injection 30 mg (30 mg Intramuscular Given 02/23/18 0118)  potassium chloride SA (K-DUR,KLOR-CON) CR tablet 40 mEq (40 mEq Oral Given 02/23/18 0118)                                                                                                                                    Procedures Procedures  (including critical care time)  Medical Decision Making / ED Course I have reviewed the nursing notes for this encounter and the patient's prior records (if available in EHR or on provided paperwork).    Patient presents with a variety of nonspecific complaints.  1.  Headache is typical for the patient.  No focal deficits noted on exam. No recent head trauma. No fever. Doubt meningitis. Doubt intracranial bleed. Doubt IIH. No indication for imaging.  Will provide with Tylenol and Toradol.  2. Intermittent right arm pain with radiation to chest.  Highly inconsistent with ACS.  EKG without acute ischemic changes or evidence of pericarditis. Trop negative.  Doubt cardiac etiology.  Doubt PE.  Currently asymptomatic. Likely muscular in nature.  3.  Generalized  fatigue with lower extremity weakness.  Patient has great strength on exam without loss of sensation and good reflexes.  Presentation is not suspicious for Lorelee Cover.  Labs with mild hypokalemia which may be contributing to her symptoms.  Will provide with K. Dur.  The patient appears reasonably screened and/or stabilized for discharge and I doubt any other medical condition or other [facility] requiring further screening, evaluation, or treatment in the ED at this time prior to discharge.  The patient is safe for discharge with strict return precautions.   Final Clinical Impression(s) / ED Diagnoses Final diagnoses:  Episodic tension-type headache, not intractable  Hypokalemia  Other fatigue   Disposition: Discharge  Condition: Good  I have discussed the results, Dx and Tx plan with the patient who expressed understanding and agree(s) with the plan. Discharge instructions discussed at great length. The patient was given strict return precautions who verbalized understanding of the instructions. No further questions at time of discharge.    ED Discharge Orders  Ordered    potassium chloride (K-DUR) 10 MEQ tablet  2 times daily     02/23/18 0129           Follow Up: Primary care provider  Schedule an appointment as soon as possible for a visit  If you do not have a primary care physician, contact [facility] at [phone] for referral      This chart was dictated using voice recognition software.  Despite best efforts to proofread,  errors can occur which can change the documentation meaning.   Nira Connardama,  Eduardo, MD 02/23/18 [phone]

## 2018-03-23 DIAGNOSIS — R112 Nausea with vomiting, unspecified: Secondary | ICD-10-CM | POA: Diagnosis not present

## 2018-03-23 DIAGNOSIS — B349 Viral infection, unspecified: Secondary | ICD-10-CM | POA: Diagnosis not present

## 2018-03-25 ENCOUNTER — Emergency Department (HOSPITAL_COMMUNITY): Payer: BLUE CROSS/BLUE SHIELD

## 2018-03-25 ENCOUNTER — Emergency Department (HOSPITAL_COMMUNITY)
Admission: EM | Admit: 2018-03-25 | Discharge: 2018-03-25 | Disposition: A | Discharge status: Home / Self Care | Payer: BLUE CROSS/BLUE SHIELD | Attending: Emergency Medicine | Admitting: Emergency Medicine

## 2018-03-25 ENCOUNTER — Encounter (HOSPITAL_COMMUNITY): Payer: Self-pay | Admitting: *Deleted

## 2018-03-25 ENCOUNTER — Other Ambulatory Visit: Payer: Self-pay

## 2018-03-25 DIAGNOSIS — E876 Hypokalemia: Secondary | ICD-10-CM | POA: Diagnosis not present

## 2018-03-25 DIAGNOSIS — R109 Unspecified abdominal pain: Secondary | ICD-10-CM

## 2018-03-25 DIAGNOSIS — R42 Dizziness and giddiness: Secondary | ICD-10-CM | POA: Diagnosis not present

## 2018-03-25 DIAGNOSIS — R197 Diarrhea, unspecified: Secondary | ICD-10-CM | POA: Diagnosis not present

## 2018-03-25 DIAGNOSIS — R112 Nausea with vomiting, unspecified: Secondary | ICD-10-CM

## 2018-03-25 DIAGNOSIS — F17210 Nicotine dependence, cigarettes, uncomplicated: Secondary | ICD-10-CM | POA: Diagnosis not present

## 2018-03-25 DIAGNOSIS — Z79899 Other long term (current) drug therapy: Secondary | ICD-10-CM | POA: Diagnosis not present

## 2018-03-25 DIAGNOSIS — K8020 Calculus of gallbladder without cholecystitis without obstruction: Secondary | ICD-10-CM | POA: Diagnosis not present

## 2018-03-25 DIAGNOSIS — R1084 Generalized abdominal pain: Secondary | ICD-10-CM | POA: Diagnosis not present

## 2018-03-25 LAB — COMPREHENSIVE METABOLIC PANEL
ALT: 19 U/L (ref 0–44)
AST: 27 U/L (ref 15–41)
Albumin: 4.8 g/dL (ref 3.5–5.0)
Alkaline Phosphatase: 89 U/L (ref 38–126)
Anion gap: 9 (ref 5–15)
BUN: <5 mg/dL — ABNORMAL LOW (ref 6–20)
CO2: 29 mmol/L (ref 22–32)
Calcium: 9.2 mg/dL (ref 8.9–10.3)
Chloride: 100 mmol/L (ref 98–111)
Creatinine, Ser: 0.73 mg/dL (ref 0.44–1.00)
GFR calc Af Amer: >60 mL/min (ref >60)
GFR calc non Af Amer: >60 mL/min (ref >60)
Glucose, Bld: 86 mg/dL (ref 70–99)
Potassium: 2.5 mmol/L — CL (ref 3.5–5.1)
Sodium: 138 mmol/L (ref 135–145)
Total Bilirubin: 0.9 mg/dL (ref 0.3–1.2)
Total Protein: 7.5 g/dL (ref 6.5–8.1)

## 2018-03-25 LAB — URINALYSIS, ROUTINE W REFLEX MICROSCOPIC
Bilirubin Urine: NEGATIVE
Glucose, UA: NEGATIVE mg/dL
Hgb urine dipstick: NEGATIVE
Ketones, ur: NEGATIVE mg/dL
Leukocytes,Ua: NEGATIVE
Nitrite: NEGATIVE
Protein, ur: NEGATIVE mg/dL
Specific Gravity, Urine: 1.002 — ABNORMAL LOW (ref 1.005–1.030)
pH: 7 (ref 5.0–8.0)

## 2018-03-25 LAB — CBC
HCT: 39.9 % (ref 36.0–46.0)
Hemoglobin: 13.5 g/dL (ref 12.0–15.0)
MCH: 31.3 pg (ref 26.0–34.0)
MCHC: 33.8 g/dL (ref 30.0–36.0)
MCV: 92.6 fL (ref 80.0–100.0)
Platelets: 315 10*3/uL (ref 150–400)
RBC: 4.31 MIL/uL (ref 3.87–5.11)
RDW: 12.5 % (ref 11.5–15.5)
WBC: 3.6 10*3/uL — ABNORMAL LOW (ref 4.0–10.5)
nRBC: 0 % (ref 0.0–0.2)

## 2018-03-25 LAB — I-STAT TROPONIN, ED: Troponin i, poc: 0 ng/mL (ref 0.00–0.08)

## 2018-03-25 LAB — MAGNESIUM: Magnesium: 2.4 mg/dL (ref 1.7–2.4)

## 2018-03-25 LAB — POTASSIUM: Potassium: 2.9 mmol/L — ABNORMAL LOW (ref 3.5–5.1)

## 2018-03-25 LAB — I-STAT BETA HCG BLOOD, ED (MC, WL, AP ONLY): I-stat hCG, quantitative: <5 m[IU]/mL (ref <5)

## 2018-03-25 LAB — LIPASE, BLOOD: Lipase: 39 U/L (ref 11–51)

## 2018-03-25 MED ORDER — IOPAMIDOL (ISOVUE-300) INJECTION 61%
INTRAVENOUS | Status: AC
  Filled 2018-03-25: qty 100

## 2018-03-25 MED ORDER — POTASSIUM CHLORIDE CRYS ER 20 MEQ PO TBCR: 20.0000 meq | EXTENDED_RELEASE_TABLET | Freq: Two times a day (BID) | ORAL | 0 refills | Status: DC

## 2018-03-25 MED ORDER — MORPHINE SULFATE (PF) 4 MG/ML IV SOLN
4.0000 mg | Freq: Once | INTRAVENOUS | Status: AC
  Administered 2018-03-25: 4 mg via INTRAVENOUS
  Filled 2018-03-25: qty 1

## 2018-03-25 MED ORDER — ONDANSETRON 4 MG PO TBDP: 4.0000 mg | ORAL_TABLET | Freq: Three times a day (TID) | ORAL | 0 refills | Status: DC | PRN

## 2018-03-25 MED ORDER — SODIUM CHLORIDE 0.9 % IV BOLUS
1000.0000 mL | Freq: Once | INTRAVENOUS | Status: AC
  Administered 2018-03-25: 1000 mL via INTRAVENOUS

## 2018-03-25 MED ORDER — SODIUM CHLORIDE 0.9% FLUSH: 3.0000 mL | Freq: Once | INTRAVENOUS | Status: DC

## 2018-03-25 MED ORDER — ONDANSETRON HCL 4 MG/2ML IJ SOLN
4.0000 mg | Freq: Once | INTRAMUSCULAR | Status: AC
  Administered 2018-03-25: 4 mg via INTRAVENOUS
  Filled 2018-03-25: qty 2

## 2018-03-25 MED ORDER — DICYCLOMINE HCL 20 MG PO TABS: 20.0000 mg | ORAL_TABLET | Freq: Three times a day (TID) | ORAL | 0 refills | Status: DC | PRN

## 2018-03-25 MED ORDER — IOPAMIDOL (ISOVUE-300) INJECTION 61%
100.0000 mL | Freq: Once | INTRAVENOUS | Status: AC | PRN
  Administered 2018-03-25: 100 mL via INTRAVENOUS

## 2018-03-25 MED ORDER — HYDROMORPHONE HCL 1 MG/ML IJ SOLN
1.0000 mg | Freq: Once | INTRAMUSCULAR | Status: AC
  Administered 2018-03-25: 1 mg via INTRAVENOUS
  Filled 2018-03-25: qty 1

## 2018-03-25 MED ORDER — POTASSIUM CHLORIDE CRYS ER 20 MEQ PO TBCR
40.0000 meq | EXTENDED_RELEASE_TABLET | Freq: Once | ORAL | Status: AC
  Administered 2018-03-25: 40 meq via ORAL
  Filled 2018-03-25: qty 2

## 2018-03-25 MED ORDER — OXYCODONE-ACETAMINOPHEN 5-325 MG PO TABS: 1.0000 | ORAL_TABLET | Freq: Four times a day (QID) | ORAL | 0 refills | Status: DC | PRN

## 2018-03-25 MED ORDER — POTASSIUM CHLORIDE 10 MEQ/100ML IV SOLN
10.0000 meq | INTRAVENOUS | Status: AC
  Administered 2018-03-25 (×3): 10 meq via INTRAVENOUS
  Filled 2018-03-25 (×3): qty 100

## 2018-03-25 MED ORDER — PROMETHAZINE HCL 25 MG/ML IJ SOLN
12.5000 mg | Freq: Once | INTRAMUSCULAR | Status: AC
  Administered 2018-03-25: 12.5 mg via INTRAVENOUS
  Filled 2018-03-25: qty 1

## 2018-03-25 MED ORDER — SODIUM CHLORIDE (PF) 0.9 % IJ SOLN
INTRAMUSCULAR | Status: AC
  Filled 2018-03-25: qty 50

## 2018-03-25 MED ORDER — POTASSIUM CHLORIDE 10 MEQ/100ML IV SOLN: 10.0000 meq | Freq: Once | INTRAVENOUS | Status: DC

## 2018-03-25 NOTE — ED Provider Notes (Signed)
[facility]-EMERGENCY DEPT Provider Note   CSN: [id number] Arrival date & time: 03/25/18  1302    History   Chief Complaint Chief Complaint  Patient presents with  . Abdominal Pain  . Emesis    HPI Laura [name] is a 53 y.o. female with a hx of anxiety, fibromyalgia, migraines, and prior vertebral artery dissection who presents to the emergency department with complaints of N/V/D with associated abdominal discomfort for the past 5 days.  Patient states shortly after having lunch she developed nausea, vomiting, diarrhea, and generalized abdominal pain.  States symptoms have persisted since onset.  She states she has had about 3-4 episodes of emesis per day and too numerous to count episodes of diarrhea per day.  She states both emesis and diarrhea are nonbloody.  She states that the vomiting resolved yesterday but the nausea,  Diarrhea, and abdominal discomfort persist.  She states abdominal discomfort is constant, an 8 out of 10 in severity, seems to be generalized but most significant in the right side of the abdomen.  She describes the pain as cramping/achy.  At times it feels like a tightness that goes into the lower chest.  No specific alleviating or aggravating factors.  She was seen in urgent care and given Zofran/Phenergan which did not seem to change her symptoms much, she has not had these medications today.  Is able to tolerate p.o. fluids but has not tried to eat anything.  She has had some subjective fevers/chills and lightheadedness.  Denies hematemesis, melena, hematochezia, dysuria, vaginal bleeding, vaginal discharge, numbness, tingling, weakness, change in vision, headache,  or syncope. Denies recent foreign travel or abx use.     HPI  Past Medical History:  Diagnosis Date  . Anxiety   . Fibromyalgia   . Migraine   . Tobacco abuse   . Vertebral artery disease (HCC)    history of left vertebral artery dissection 2011    Patient Active Problem List    Diagnosis Date Noted  . Tobacco abuse 05/18/2014  . Vertebral artery disease (HCC) 05/18/2014  . Heel pain 06/12/2010  . ACHILLES TENDINITIS 01/07/2010  . DYSURIA 01/07/2010  . MIGRAINE HEADACHE 11/14/2009  . DISC DISEASE, CERVICAL 11/14/2009    Past Surgical History:  Procedure Laterality Date  . ABDOMINAL HYSTERECTOMY    . BACK SURGERY  2008  . deliveries      [phone]  . thumb surgery Left 2018   [facility]  . WRIST SURGERY  1994     OB History   No obstetric history on file.      Home Medications    Prior to Admission medications   Medication Sig Start Date End Date Taking? Authorizing Provider  butalbital-acetaminophen-caffeine (FIORICET, ESGIC) [phone] MG tablet Take 1 tablet by mouth every 6 (six) hours as needed for headache. 07/08/17 07/08/18  Garlon Hatchet, [name]  clonazePAM (KLONOPIN) 0.5 MG tablet Take 1 mg by mouth at bedtime.     [provider]  PARoxetine (PAXIL) 10 MG tablet Take 10 mg by mouth daily.    [provider]  potassium chloride (K-DUR) 10 MEQ tablet Take 1 tablet (10 mEq total) by mouth 2 (two) times daily for 14 days. 02/23/18 03/09/18  Nira Conn, MD  rizatriptan (MAXALT-MLT) 10 MG disintegrating tablet Take 1 tablet (10 mg total) by mouth as needed for migraine. May repeat in 2 hours if needed Patient not taking: Reported on 07/08/2017 04/13/17   Penumalli, Glenford Bayley, MD  topiramate (  TOPAMAX) 50 MG tablet Take 1 tablet (50 mg total) by mouth 2 (two) times daily. Patient not taking: Reported on 07/08/2017 04/13/17   Suanne Marker, MD    Family History Family History  Problem Relation Age of Onset  . Lung cancer Father   . Breast cancer Sister     Social History Social History   Tobacco Use  . Smoking status: Former Smoker    Packs/day: 1.00    Types: Cigarettes  . Smokeless tobacco: Never Used  Substance Use Topics  . Alcohol use: No    Alcohol/week: 0.0 standard drinks  . Drug use: No      Allergies   Aspirin   Review of Systems Review of Systems  Constitutional: Positive for appetite change, chills and fever (subjective).  Respiratory: Negative for shortness of breath.   Gastrointestinal: Positive for abdominal pain, diarrhea, nausea and vomiting. Negative for blood in stool and constipation.  Genitourinary: Negative for dysuria, vaginal bleeding and vaginal discharge.  Neurological: Positive for light-headedness. Negative for syncope, weakness, numbness and headaches.  All other systems reviewed and are negative.  Physical Exam Updated Vital Signs BP (!) 146/92 (BP Location: Left Arm)   Pulse 88   Temp 98.4 F (36.9 C) (Oral)   Resp 16   Ht 5\' 3"  (1.6 m)   Wt 59.4 kg   SpO2 100%   BMI 23.21 kg/m   Physical Exam Vitals signs and nursing note reviewed.  Constitutional:      General: She is not in acute distress.    Appearance: She is well-developed. She is not toxic-appearing.  HENT:     Head: Normocephalic and atraumatic.  Eyes:     General:        Right eye: No discharge.        Left eye: No discharge.     Extraocular Movements: Extraocular movements intact.     Conjunctiva/sclera: Conjunctivae normal.     Pupils: Pupils are equal, round, and reactive to light.  Neck:     Musculoskeletal: Neck supple.  Cardiovascular:     Rate and Rhythm: Normal rate and regular rhythm.  Pulmonary:     Effort: Pulmonary effort is normal. No respiratory distress.     Breath sounds: Normal breath sounds. No wheezing, rhonchi or rales.  Abdominal:     General: There is no distension.     Palpations: Abdomen is soft.     Tenderness: There is abdominal tenderness (Generalized with increased tenderness in the right upper and right lower quadrant.). There is no guarding or rebound.  Skin:    General: Skin is warm and dry.     Findings: No rash.  Neurological:     Mental Status: She is alert.     Comments: Clear speech.  CN III through XII grossly intact.   Station grossly intact bilateral upper and lower extremities.  5 out of 5 symmetric grip strength.  5 out of 5 strength with plantar dorsiflexion bilaterally.  Normal finger-to-nose.  Negative pronator drift.  Patient is ambulatory.  Psychiatric:        Behavior: Behavior normal.    ED Treatments / Results  Labs (all labs ordered are listed, but only abnormal results are displayed) Labs Reviewed  COMPREHENSIVE METABOLIC PANEL - Abnormal; Notable for the following components:      Result Value   Potassium 2.5 (*)    BUN <5 (*)    All other components within normal limits  CBC - Abnormal; Notable for  the following components:   WBC 3.6 (*)    All other components within normal limits  URINALYSIS, ROUTINE W REFLEX MICROSCOPIC - Abnormal; Notable for the following components:   Color, Urine COLORLESS (*)    Specific Gravity, Urine 1.002 (*)    All other components within normal limits  LIPASE, BLOOD  MAGNESIUM  POTASSIUM  I-STAT BETA HCG BLOOD, ED ([facility], [facility], [facility] ONLY)  I-STAT TROPONIN, ED    EKG EKG Interpretation  Date/Time:  Friday March 25 2018 15:18:15 EST Ventricular Rate:  76 PR Interval:    QRS Duration: 102 QT Interval:  383 QTC Calculation: 431 R Axis:   12 Text Interpretation:  Sinus rhythm Confirmed by Raeford Razor [phone]) on 03/25/2018 3:28:37 PM   Radiology Ct Abdomen Pelvis W Contrast  Result Date: 03/25/2018 CLINICAL DATA:  Abdominal pain, nausea, vomiting EXAM: CT ABDOMEN AND PELVIS WITH CONTRAST TECHNIQUE: Multidetector CT imaging of the abdomen and pelvis was performed using the standard protocol following bolus administration of intravenous contrast. CONTRAST:  ISOVUE-300 IOPAMIDOL (ISOVUE-300) INJECTION 61% COMPARISON:  None. FINDINGS: Lower chest: Lung bases are clear. No effusions. Heart is normal size. Hepatobiliary: No focal hepatic abnormality. Gallbladder unremarkable. Pancreas: No focal abnormality or ductal dilatation. Spleen: No focal  abnormality.  Normal size. Adrenals/Urinary Tract: No adrenal abnormality. No focal renal abnormality. No stones or hydronephrosis. Urinary bladder is unremarkable. Stomach/Bowel: Normal appendix. Stomach, large and small bowel grossly unremarkable. Vascular/Lymphatic: Aortic atherosclerosis. No enlarged abdominal or pelvic lymph nodes. Reproductive: Prior hysterectomy.  No adnexal masses. Other: No free fluid or free air. Musculoskeletal: No acute bony abnormality. IMPRESSION: No acute findings in the abdomen or pelvis. Electronically Signed   By: Charlett Nose M.D.   On: 03/25/2018 18:06   US Abdomen Limited Ruq  Result Date: 03/25/2018 CLINICAL DATA:  Right upper quadrant abdominal pain. EXAM: ULTRASOUND ABDOMEN LIMITED RIGHT UPPER QUADRANT COMPARISON:  CT abdomen and pelvis 03/25/2018 FINDINGS: Gallbladder: Gallstones with the largest measuring 1.2 cm. No gallbladder wall thickening. No sonographic Murphy sign noted by sonographer. Common bile duct: Diameter: 5 mm Liver: No focal lesion identified. Within normal limits in parenchymal echogenicity. Portal vein is patent on color Doppler imaging with normal direction of blood flow towards the liver. IMPRESSION: Cholelithiasis without evidence of cholecystitis. Electronically Signed   By: Sebastian Ache M.D.   On: 03/25/2018 19:06    Procedures Procedures (including critical care time)  Medications Ordered in ED Medications  sodium chloride flush (NS) 0.9 % injection 3 mL (has no administration in time range)  potassium chloride SA (K-DUR,KLOR-CON) CR tablet 40 mEq (has no administration in time range)  potassium chloride 10 mEq in 100 mL IVPB (0 mEq Intravenous Stopped 03/25/18 1902)  morphine 4 MG/ML injection 4 mg (4 mg Intravenous Given 03/25/18 1542)  sodium chloride 0.9 % bolus 1,000 mL (0 mLs Intravenous Stopped 03/25/18 2007)  ondansetron (ZOFRAN) injection 4 mg (4 mg Intravenous Given 03/25/18 1543)  iopamidol (ISOVUE-300) 61 % injection 100 mL  (100 mLs Intravenous Contrast Given 03/25/18 1741)  HYDROmorphone (DILAUDID) injection 1 mg (1 mg Intravenous Given 03/25/18 1815)  potassium chloride SA (K-DUR,KLOR-CON) CR tablet 40 mEq (40 mEq Oral Given 03/25/18 2141)  promethazine (PHENERGAN) injection 12.5 mg (12.5 mg Intravenous Given 03/25/18 2017)     Initial Impression / Assessment and Plan / ED Course  I have reviewed the triage vital signs and the nursing notes.  Pertinent labs & imaging results that were available during my care of the patient  were reviewed by me and considered in my medical decision making (see chart for details).    Patient presents to the emergency department with an/V/D and abdominal discomfort for the past 5 days.  Patient nontoxic-appearing, no apparent distress, vitals WNL with exception of elevated blood pressure, low suspicion for HTN emergency. Work-up per triage reviewed:  CBC: Mild leukopenia @ 3.6. No anemia.  CMP: Hypokalemia @ 2.5- will parenterally and orally replace if tolerating PO, will add on magnesium & obtain EKG. No other electrolyte derangement. Renal function preserved Lipase: WNL Preg test: Negative UA: No UTI or ketonuria.    15:25: Initial evaluation:  Exam with generalized abdominal tenderness that seems to have increased focality to the right upper and right lower quadrant without peritoneal signs.  Plan for EKG, troponin, magnesium, and CT abdomen/pelvis.  EKG: NSR, no STEMI, QTc 431 Magnesium: WNL  Fluids, analgesics, anti-emetics, and IV potassium ordered.   CT negative for acute abnormality.   18:14: RE-EVAL: Patient remains in substantial pain, mostly localized to RUQ now. Will re-dose pain medicine and assess w/ RUQ [place].   20:00: RUQ [place] with cholelithiasis, no cholecystitis. Patient has had some pain improvement, remains nauseous. W/ persistent nausea and continued discomfort we discussed option of admission for her continued sxs & for hypokalemia, patient adamant that she  does not want to be admitted to the hospital, she is requesting discharge. Risks/benefits discussed, she states she does not wish to be admitted and wants to go home if at all possible. Will trial phenergan w/ PO challenge w/ food & kdur tablets.   Patient ultimately able to tolerate PO potassium & PO sandwich. Unclear definitive etiology, but work-up does not appear consistent with acute surgical abdominal pathology such as cholecystitis, pancreatitis, diverticulitis, perf/obstruction, or appendicitis. Her trop was negative and her EKG did not show STEMI after several days of persistent sxs. No neuro deficits. Possibly viral GI illness, but not definitive. Her potassium improved to 2.9- remains low, but similar to prior 3.0 on record from 1 month ago. We again discussed admission- she refused after risks/benefits discussion. Her initial EKG w/ worse potassium upon arrival did not show QT prolongation which is re-assuring. She is tolerating PO now. Will re-dose oral potassium in the ER. Will discharge home with analgesics, anti-emetics, potassium supplement, and diet recommendations with very strict return precautions & PCP follow up. GI follow up information was also provided. I discussed results, treatment plan, need for follow-up, and return precautions with the patient & her husband at bedside. Provided opportunity for questions, patient & her husband confirmed understanding and are in agreement with plan.   Final Clinical Impressions(s) / ED Diagnoses   Final diagnoses:  Abdominal pain  Nausea vomiting and diarrhea  Hypokalemia    ED Discharge Orders         Ordered    oxyCODONE-acetaminophen (PERCOCET/ROXICET) 5-325 MG tablet  Every 6 hours PRN     03/25/18 2309    dicyclomine (BENTYL) 20 MG tablet  Every 8 hours PRN     03/25/18 2309    ondansetron (ZOFRAN ODT) 4 MG disintegrating tablet  Every 8 hours PRN     03/25/18 2309    potassium chloride SA (K-DUR,KLOR-CON) 20 MEQ tablet  2 times  daily     03/25/18 2309           Cherly Anderson, [name] 03/25/18 2314    Raeford Razor, MD 03/27/18 0745

## 2018-03-25 NOTE — ED Notes (Signed)
Patient transported to CT 

## 2018-03-25 NOTE — ED Notes (Signed)
Pt is c/o dizziness and nausea, pt declined zofran ODT in triage.  She states she has been taking same med without relief.

## 2018-03-25 NOTE — Discharge Instructions (Addendum)
You are seen in the ER today for nausea, vomiting, diarrhea, and abdominal pain.  Your work-up in the emergency department was overall reassuring with the exception that your potassium was very low at 2.5 initially, it improved to 2.9, this is still low (normal is 3.5-5.1).  You were given oral and IV potassium in the ER.  We are sending you home with a prescription for potassium.  It is essential that you get this rechecked within 3 days.  Remainder of your labs were all otherwise fairly normal.  Your white blood cell count was mildly low at 3.6, this is also done to have rechecked.  Your CT scan was normal.  Your ultrasound showed that you have gallstones but not an acute infection of your gallbladder.  Sending you home with multiple medicines to help with your symptoms: - Bentyl- take every 8 hours as needed for abdominal cramping.  - -Percocet-this is a narcotic/controlled substance medication that has potential addicting qualities.  We recommend that you take 1-2 tablets every 6 hours as needed for severe pain.  Do not drive or operate heavy machinery when taking this medicine as it can be sedating. Do not drink alcohol or take other sedating medications when taking this medicine for safety reasons.  Keep this out of reach of small children.  Please be aware this medicine has Tylenol in it (325 mg/tab) do not exceed the maximum dose of Tylenol in a day per over the counter recommendations should you decide to supplement with Tylenol over the counter.  - Zofran- take every 8 hours as needed for nausea/vomiting.  - Potassium- take 1 tablet twice per day for potassium replacement.   We have prescribed you new medication(s) today. Discuss the medications prescribed today with your pharmacist as they can have adverse effects and interactions with your other medicines including over the counter and prescribed medications. Seek medical evaluation if you start to experience new or abnormal symptoms after  taking one of these medicines, seek care immediately if you start to experience difficulty breathing, feeling of your throat closing, facial swelling, or rash as these could be indications of a more serious allergic reaction  Follow attached diet guidelines.   Follow-up with primary care within 3 days.  You may also follow-up with GI within 3 days.  Return to the ER immediately for new or worsening symptoms including but not limited to worsening pain, inability to keep fluids down, blood in vomit, blood in diarrhea, chest pain, trouble breathing, feeling as though you going to pass out, numbness, weakness change in your speech, headache, or any other concerns at all.

## 2018-03-25 NOTE — ED Notes (Signed)
Patient tolerating water, given saltines to PO challenge.

## 2018-03-25 NOTE — ED Notes (Signed)
Patient ambulatory to restroom at this time without difficulty. 

## 2018-03-25 NOTE — ED Triage Notes (Signed)
Pt reports sudden onset of abd cramping with n/v/d x 5 days ago Sunday, she went to [facility] she was given rx for phenergan and zofran x 2 days without relief.  Reports 2 emesis and 4 diarrhea today which she describes "green and grits."  She has not been able to eat since Sunday but have been sipping on sprite.

## 2018-04-04 DIAGNOSIS — R1011 Right upper quadrant pain: Secondary | ICD-10-CM | POA: Diagnosis not present

## 2018-04-04 DIAGNOSIS — R112 Nausea with vomiting, unspecified: Secondary | ICD-10-CM | POA: Diagnosis not present

## 2018-04-04 DIAGNOSIS — R197 Diarrhea, unspecified: Secondary | ICD-10-CM | POA: Diagnosis not present

## 2018-04-04 DIAGNOSIS — R1084 Generalized abdominal pain: Secondary | ICD-10-CM | POA: Diagnosis not present

## 2018-04-04 DIAGNOSIS — E876 Hypokalemia: Secondary | ICD-10-CM | POA: Diagnosis not present

## 2018-04-05 DIAGNOSIS — R197 Diarrhea, unspecified: Secondary | ICD-10-CM | POA: Diagnosis not present

## 2018-04-12 DIAGNOSIS — R1011 Right upper quadrant pain: Secondary | ICD-10-CM | POA: Diagnosis not present

## 2018-04-12 DIAGNOSIS — R197 Diarrhea, unspecified: Secondary | ICD-10-CM | POA: Diagnosis not present

## 2018-04-12 DIAGNOSIS — K8020 Calculus of gallbladder without cholecystitis without obstruction: Secondary | ICD-10-CM | POA: Diagnosis not present

## 2018-04-14 ENCOUNTER — Other Ambulatory Visit: Payer: Self-pay | Admitting: General Surgery

## 2018-04-14 DIAGNOSIS — Z90710 Acquired absence of both cervix and uterus: Secondary | ICD-10-CM | POA: Diagnosis not present

## 2018-04-14 DIAGNOSIS — K8020 Calculus of gallbladder without cholecystitis without obstruction: Secondary | ICD-10-CM | POA: Diagnosis not present

## 2018-04-14 DIAGNOSIS — I739 Peripheral vascular disease, unspecified: Secondary | ICD-10-CM | POA: Diagnosis not present

## 2018-04-14 DIAGNOSIS — Z87891 Personal history of nicotine dependence: Secondary | ICD-10-CM | POA: Diagnosis not present

## 2018-04-18 NOTE — H&P (Signed)
[name]  Location: [facility] Patient #: [id number] DOB: February 17, 1965 Married / Language: English / Race: White Female       History of Present Illness    This is a pleasant 53 year old female from [place]. She is referred by Dr. Carman [name] for symptomatic gallstones. [name] is her PCP.      She says that she's had episodes of right-sided pain and nausea vomiting and diarrhea in the past but it was never evaluated. She had another episode on February 21. Things got worse and she went to the [name] emergency Department on February 28 where CT scan was normal. Ultrasound showed gallstones with largest being 1.2 cm, and lab work was normal. She was discharged home and saw Dr. Randa [name] who felt that she should be considered for cholecystectomy. The patient states that she still has the right-sided pain has mild nausea and is anorexic and avoiding food. No further emesis for a couple of weeks and the diarrhea is getting better. The diarrhea clearly seems to be related to the episodes of pain and is not a chronic problem.      Past history includes vertebral artery disease. 2008 was having headaches. Told she had vertebral artery aneurysm. Reports angiogram and being told nothing was a problem former tobacco abuse quit 2 years ago. Fibromyalgia. Headaches. Vaginal hysterectomy, kidney by bleeding requiring Pfannenstiel incision and control. She does not know whether her ovaries removed. She's had back surgery, wrist and thumb surgery Family history father died of lung cancer. Sister had breast cancer but hasn't is a survivor. Social history reveals she is married with 3 children and lives in [place]. Quit smoking 2 years ago. She is employed by [employer]. 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       She very much wants to go ahead with cholecystectomy and I think that is appropriate. Her symptoms are fairly typical and persistent. Because of  daily pain and nausea this is not elective surgery and needs to be scheduled urgently She'll be scheduled for laparoscopic cholecystectomy, possible cholangiogram in the near future. I discussed the indications, details, techniques, and numerous risk of the surgery with her. She is aware of the risk of bleeding, infection, conversion to open laparotomy, port site hernia, bile leak, common duct stones requiring ERCP, pancreatitis, postop diarrhea, injury to adjacent organs of major reconstructive surgery. She understands all of these issues. Over questions were answered. She agrees with this plan.   Past Surgical History Hysterectomy (not due to cancer) - Complete  Spinal Surgery - Neck    Colonoscopy  never Mammogram  1-3 years ago Pap Smear  1-5 years ago  Allergie Aspirin *ANALGESICS - NonNarcotic*  Swelling. Allergies Reconciled   Medication History Promethazine HCl (25MG  Tablet, Oral) Active. Ondansetron (4MG  Tablet Disint, Oral) Active. Hyoscyamine Sulfate (0.125MG  Tab Sublingual, Sublingual) Active. Omeprazole (40MG  Capsule DR, Oral) Active. Medications Reconciled  Social History Alcohol use  Occasional alcohol use. Caffeine use  Carbonated beverages. No drug use  Tobacco use  Former smoker.  Family History Arthritis  Father, Mother, Sister. Breast Cancer  Sister. Cancer  Father. Colon Polyps  Father, Sister. Hypertension  Father. Ischemic Bowel Disease  Father. Melanoma  Father. Migraine Headache  Father. Respiratory Condition  Father.  Pregnancy / Birth History Age at menarche  13 years. Age of menopause  35-50 Gravida  3 Maternal age  27-20 Para  3  Other Problems Anxiety Disorder  Arthritis  Cholelithiasis  Migraine Headache  Review of Systems General Present- Appetite Loss, Fatigue, Night Sweats and Weight Loss. Not Present- Chills, Fever and Weight Gain. Skin Present- Dryness. Not Present- Change in  Wart/Mole, Hives, Jaundice, New Lesions, Non-Healing Wounds, Rash and Ulcer. HEENT Present- Wears glasses/contact lenses. Not Present- Earache, Hearing Loss, Hoarseness, Nose Bleed, Oral Ulcers, Ringing in the Ears, Seasonal Allergies, Sinus Pain, Sore Throat, Visual Disturbances and Yellow Eyes. Respiratory Not Present- Bloody sputum, Chronic Cough, Difficulty Breathing, Snoring and Wheezing. Breast Not Present- Breast Mass, Breast Pain, Nipple Discharge and Skin Changes. Cardiovascular Not Present- Chest Pain, Difficulty Breathing Lying Down, Leg Cramps, Palpitations, Rapid Heart Rate, Shortness of Breath and Swelling of Extremities. Gastrointestinal Present- Abdominal Pain and Nausea. Not Present- Bloating, Bloody Stool, Change in Bowel Habits, Chronic diarrhea, Constipation, Difficulty Swallowing, Excessive gas, Gets full quickly at meals, Hemorrhoids, Indigestion, Rectal Pain and Vomiting. Female Genitourinary Not Present- Frequency, Nocturia, Painful Urination, Pelvic Pain and Urgency. Musculoskeletal Present- Back Pain, Muscle Pain and Muscle Weakness. Not Present- Joint Pain, Joint Stiffness and Swelling of Extremities. Neurological Present- Headaches. Not Present- Decreased Memory, Fainting, Numbness, Seizures, Tingling, Tremor, Trouble walking and Weakness. Psychiatric Present- Anxiety. Not Present- Bipolar, Change in Sleep Pattern, Depression, Fearful and Frequent crying. Endocrine Not Present- Cold Intolerance, Excessive Hunger, Hair Changes, Heat Intolerance, Hot flashes and New Diabetes. Hematology Not Present- Blood Thinners, Easy Bruising, Excessive bleeding, Gland problems, HIV and Persistent Infections.  Vital Weight: 130.8 lb Height: 63in Body Surface Area: 1.61 m Body Mass Index: 23.17 kg/m  Temp.: 98.31F(Oral)  Pulse: 99 (Regular)  P.OX: 98% (Room air) BP: 152/96 (Sitting, Left Arm, Standard)       Physical Exam General Mental Status-Alert. General  Appearance-Consistent with stated age. Hydration-Well hydrated. Voice-Normal. Note: Pleasant and cooperative. In no distress.   Head and Neck Head-normocephalic, atraumatic with no lesions or palpable masses. Trachea-midline. Thyroid Gland Characteristics - normal size and consistency.  Eye Eyeball - Bilateral-Extraocular movements intact. Sclera/Conjunctiva - Bilateral-No scleral icterus.  Chest and Lung Exam Chest and lung exam reveals -quiet, even and easy respiratory effort with no use of accessory muscles and on auscultation, normal breath sounds, no adventitious sounds and normal vocal resonance. Inspection Chest Wall - Normal. Back - normal.  Cardiovascular Cardiovascular examination reveals -normal heart sounds, regular rate and rhythm with no murmurs and normal pedal pulses bilaterally.  Abdomen Note: Objectively abdomen is soft and nontender. She does report some pain when I percuss the right costal margin being different than the left. No hernias. Healed Pfannenstiel incision. Objectively a benign exam. No organomegaly   Neurologic Neurologic evaluation reveals -alert and oriented x 3 with no impairment of recent or remote memory. Mental Status-Normal.  Musculoskeletal Normal Exam - Left-Upper Extremity Strength Normal and Lower Extremity Strength Normal. Normal Exam - Right-Upper Extremity Strength Normal and Lower Extremity Strength Normal.  Lymphatic Head & Neck  General Head & Neck Lymphatics: Bilateral - Description - Normal. Axillary  General Axillary Region: Bilateral - Description - Normal. Tenderness - Non Tender. Femoral & Inguinal  Generalized Femoral & Inguinal Lymphatics: Bilateral - Description - Normal. Tenderness - Non Tender.    Assessment & Plan GALLSTONES (K80.20)   You have been having episodes of right-sided abdominal pain back pain nausea vomiting and diarrhea. you report this is getting worse and that  you're now having daily pain and mild nausea. The diarrhea is getting somewhat better  You recently evaluated in the emergency department and then by Dr. Carman [name]. CT scan and lab work was normal  Ultrasound shows several gallstones It is highly likely that your gallbladder is causing your symptoms I do not recommend any further tests or workup  you will be scheduled for laparoscopic cholecystectomy and possible cholangiogram x-ray I discussed the indications, techniques, and risk of the surgery with you in detail   FORMER SMOKER (Z87.891) HISTORY OF HYSTERECTOMY (Z90.710) VERTEBRAL ARTERY DISEASE (I73.9) Impression: Possible left sided vertebral artery aneurysm. She reports angiogram and being told there was nothing to worry about FAMILY HISTORY OF BREAST CANCER IN SISTER (Z80.3)    Angelia Mould. Derrell Lolling, M.D., [facility] General and Minimally invasive Surgery Breast and Colorectal Surgery Office:   [phone] Pager:   [phone]

## 2018-04-19 ENCOUNTER — Other Ambulatory Visit: Payer: Self-pay

## 2018-04-19 ENCOUNTER — Encounter (HOSPITAL_COMMUNITY): Payer: Self-pay | Admitting: *Deleted

## 2018-04-19 NOTE — Progress Notes (Signed)
[name] denies chest pain or shortness of breath. Patient does not have a PCP at this time.  I instructed patient that no one is allowed to come in with her, she said it's ok . I instructed patient that she could have clear liquids until 0745.  We reviewed what clear liquids are, patient voiced understanding

## 2018-04-20 ENCOUNTER — Other Ambulatory Visit: Payer: Self-pay

## 2018-04-20 ENCOUNTER — Ambulatory Visit (HOSPITAL_COMMUNITY): Payer: BLUE CROSS/BLUE SHIELD | Admitting: Vascular Surgery

## 2018-04-20 ENCOUNTER — Ambulatory Visit (HOSPITAL_COMMUNITY)
Admission: RE | Admit: 2018-04-20 | Discharge: 2018-04-20 | Disposition: A | Discharge status: Home / Self Care | Payer: BLUE CROSS/BLUE SHIELD | Attending: General Surgery | Admitting: General Surgery

## 2018-04-20 ENCOUNTER — Encounter (HOSPITAL_COMMUNITY): Payer: Self-pay | Admitting: *Deleted

## 2018-04-20 ENCOUNTER — Encounter (HOSPITAL_COMMUNITY)
Admission: RE | Disposition: A | Discharge status: Home / Self Care | Payer: Self-pay | Source: Home / Self Care | Attending: General Surgery

## 2018-04-20 DIAGNOSIS — K219 Gastro-esophageal reflux disease without esophagitis: Secondary | ICD-10-CM | POA: Diagnosis not present

## 2018-04-20 DIAGNOSIS — Z79899 Other long term (current) drug therapy: Secondary | ICD-10-CM | POA: Insufficient documentation

## 2018-04-20 DIAGNOSIS — K8020 Calculus of gallbladder without cholecystitis without obstruction: Secondary | ICD-10-CM | POA: Diagnosis not present

## 2018-04-20 DIAGNOSIS — K8010 Calculus of gallbladder with chronic cholecystitis without obstruction: Secondary | ICD-10-CM | POA: Diagnosis not present

## 2018-04-20 HISTORY — DX: Other specified postprocedural states: Z98.890

## 2018-04-20 HISTORY — DX: Adverse effect of unspecified anesthetic, initial encounter: T41.45XA

## 2018-04-20 HISTORY — DX: Gastro-esophageal reflux disease without esophagitis: K21.9

## 2018-04-20 HISTORY — DX: Other complications of anesthesia, initial encounter: T88.59XA

## 2018-04-20 HISTORY — DX: Other specified postprocedural states: R11.2

## 2018-04-20 HISTORY — PX: CHOLECYSTECTOMY: SHX55

## 2018-04-20 HISTORY — DX: Calculus of gallbladder without cholecystitis without obstruction: K80.20

## 2018-04-20 HISTORY — DX: Personal history of other medical treatment: Z92.89

## 2018-04-20 HISTORY — DX: Unspecified osteoarthritis, unspecified site: M19.90

## 2018-04-20 LAB — CBC WITH DIFFERENTIAL/PLATELET
Abs Immature Granulocytes: 0.01 10*3/uL (ref 0.00–0.07)
Basophils Absolute: 0 10*3/uL (ref 0.0–0.1)
Basophils Relative: 1 %
Eosinophils Absolute: 0.1 10*3/uL (ref 0.0–0.5)
Eosinophils Relative: 2 %
HEMATOCRIT: 38.1 % (ref 36.0–46.0)
Hemoglobin: 13 g/dL (ref 12.0–15.0)
Immature Granulocytes: 0 %
LYMPHS ABS: 1.3 10*3/uL (ref 0.7–4.0)
Lymphocytes Relative: 29 %
MCH: 31.5 pg (ref 26.0–34.0)
MCHC: 34.1 g/dL (ref 30.0–36.0)
MCV: 92.3 fL (ref 80.0–100.0)
Monocytes Absolute: 0.3 10*3/uL (ref 0.1–1.0)
Monocytes Relative: 8 %
Neutro Abs: 2.7 10*3/uL (ref 1.7–7.7)
Neutrophils Relative %: 60 %
Platelets: 249 10*3/uL (ref 150–400)
RBC: 4.13 MIL/uL (ref 3.87–5.11)
RDW: 13.1 % (ref 11.5–15.5)
WBC: 4.5 10*3/uL (ref 4.0–10.5)
nRBC: 0 % (ref 0.0–0.2)

## 2018-04-20 LAB — COMPREHENSIVE METABOLIC PANEL
ALT: 17 U/L (ref 0–44)
AST: 22 U/L (ref 15–41)
Albumin: 4 g/dL (ref 3.5–5.0)
Alkaline Phosphatase: 94 U/L (ref 38–126)
Anion gap: 8 (ref 5–15)
BUN: 6 mg/dL (ref 6–20)
CO2: 26 mmol/L (ref 22–32)
Calcium: 9.4 mg/dL (ref 8.9–10.3)
Chloride: 106 mmol/L (ref 98–111)
Creatinine, Ser: 0.67 mg/dL (ref 0.44–1.00)
GFR calc Af Amer: >60 mL/min (ref >60)
GFR calc non Af Amer: >60 mL/min (ref >60)
Glucose, Bld: 90 mg/dL (ref 70–99)
Potassium: 3 mmol/L — ABNORMAL LOW (ref 3.5–5.1)
Sodium: 140 mmol/L (ref 135–145)
Total Bilirubin: 0.8 mg/dL (ref 0.3–1.2)
Total Protein: 6.7 g/dL (ref 6.5–8.1)

## 2018-04-20 LAB — LIPASE, BLOOD: Lipase: 40 U/L (ref 11–51)

## 2018-04-20 SURGERY — LAPAROSCOPIC CHOLECYSTECTOMY
Anesthesia: General | Site: Abdomen

## 2018-04-20 MED ORDER — LACTATED RINGERS IV SOLN
INTRAVENOUS | Status: DC
  Administered 2018-04-20: 09:00:00 via INTRAVENOUS

## 2018-04-20 MED ORDER — MIDAZOLAM HCL 5 MG/5ML IJ SOLN
INTRAMUSCULAR | Status: DC | PRN
  Administered 2018-04-20: 2 mg via INTRAVENOUS

## 2018-04-20 MED ORDER — SCOPOLAMINE 1 MG/3DAYS TD PT72
1.0000 | MEDICATED_PATCH | TRANSDERMAL | Status: DC
  Administered 2018-04-20: 1.5 mg via TRANSDERMAL

## 2018-04-20 MED ORDER — CHLORHEXIDINE GLUCONATE CLOTH 2 % EX PADS: 6.0000 | MEDICATED_PAD | Freq: Once | CUTANEOUS | Status: DC

## 2018-04-20 MED ORDER — PROMETHAZINE HCL 25 MG/ML IJ SOLN: 6.2500 mg | INTRAMUSCULAR | Status: DC | PRN

## 2018-04-20 MED ORDER — ACETAMINOPHEN 500 MG PO TABS
1000.0000 mg | ORAL_TABLET | ORAL | Status: AC
  Administered 2018-04-20: 1000 mg via ORAL
  Filled 2018-04-20: qty 2

## 2018-04-20 MED ORDER — FENTANYL CITRATE (PF) 100 MCG/2ML IJ SOLN
INTRAMUSCULAR | Status: DC | PRN
  Administered 2018-04-20: 50 ug via INTRAVENOUS
  Administered 2018-04-20: 100 ug via INTRAVENOUS

## 2018-04-20 MED ORDER — SCOPOLAMINE 1 MG/3DAYS TD PT72
MEDICATED_PATCH | TRANSDERMAL | Status: AC
  Filled 2018-04-20: qty 1

## 2018-04-20 MED ORDER — PROPOFOL 10 MG/ML IV BOLUS
INTRAVENOUS | Status: DC | PRN
  Administered 2018-04-20: 150 mg via INTRAVENOUS

## 2018-04-20 MED ORDER — LIDOCAINE 2% (20 MG/ML) 5 ML SYRINGE
INTRAMUSCULAR | Status: AC
  Filled 2018-04-20: qty 5

## 2018-04-20 MED ORDER — OXYCODONE HCL 5 MG PO TABS
5.0000 mg | ORAL_TABLET | Freq: Once | ORAL | Status: AC | PRN
  Administered 2018-04-20: 5 mg via ORAL

## 2018-04-20 MED ORDER — ONDANSETRON HCL 4 MG/2ML IJ SOLN
INTRAMUSCULAR | Status: DC | PRN
  Administered 2018-04-20: 4 mg via INTRAVENOUS

## 2018-04-20 MED ORDER — 0.9 % SODIUM CHLORIDE (POUR BTL) OPTIME
TOPICAL | Status: DC | PRN
  Administered 2018-04-20: 1000 mL

## 2018-04-20 MED ORDER — PROPOFOL 10 MG/ML IV BOLUS
INTRAVENOUS | Status: AC
  Filled 2018-04-20: qty 20

## 2018-04-20 MED ORDER — CEFAZOLIN SODIUM-DEXTROSE 2-4 GM/100ML-% IV SOLN
2.0000 g | INTRAVENOUS | Status: AC
  Administered 2018-04-20: 2 g via INTRAVENOUS
  Filled 2018-04-20: qty 100

## 2018-04-20 MED ORDER — FENTANYL CITRATE (PF) 250 MCG/5ML IJ SOLN
INTRAMUSCULAR | Status: AC
  Filled 2018-04-20: qty 5

## 2018-04-20 MED ORDER — CELECOXIB 200 MG PO CAPS: 200.0000 mg | ORAL_CAPSULE | ORAL | Status: DC

## 2018-04-20 MED ORDER — SODIUM CHLORIDE 0.9 % IR SOLN
Status: DC | PRN
  Administered 2018-04-20: 1000 mL

## 2018-04-20 MED ORDER — ROCURONIUM BROMIDE 10 MG/ML (PF) SYRINGE
PREFILLED_SYRINGE | INTRAVENOUS | Status: DC | PRN
  Administered 2018-04-20: 10 mg via INTRAVENOUS
  Administered 2018-04-20: 40 mg via INTRAVENOUS

## 2018-04-20 MED ORDER — ONDANSETRON HCL 4 MG/2ML IJ SOLN
INTRAMUSCULAR | Status: AC
  Filled 2018-04-20: qty 2

## 2018-04-20 MED ORDER — BUPIVACAINE-EPINEPHRINE 0.5% -1:200000 IJ SOLN
INTRAMUSCULAR | Status: DC | PRN
  Administered 2018-04-20: 11 mL

## 2018-04-20 MED ORDER — DEXAMETHASONE SODIUM PHOSPHATE 10 MG/ML IJ SOLN
INTRAMUSCULAR | Status: AC
  Filled 2018-04-20: qty 1

## 2018-04-20 MED ORDER — BUPIVACAINE-EPINEPHRINE (PF) 0.5% -1:200000 IJ SOLN
INTRAMUSCULAR | Status: AC
  Filled 2018-04-20: qty 30

## 2018-04-20 MED ORDER — HYDROMORPHONE HCL 1 MG/ML IJ SOLN
INTRAMUSCULAR | Status: AC
  Filled 2018-04-20: qty 1

## 2018-04-20 MED ORDER — LIDOCAINE 2% (20 MG/ML) 5 ML SYRINGE
INTRAMUSCULAR | Status: DC | PRN
  Administered 2018-04-20: 60 mg via INTRAVENOUS

## 2018-04-20 MED ORDER — OXYCODONE HCL 5 MG PO TABS
ORAL_TABLET | ORAL | Status: AC
  Filled 2018-04-20: qty 1

## 2018-04-20 MED ORDER — MIDAZOLAM HCL 2 MG/2ML IJ SOLN
INTRAMUSCULAR | Status: AC
  Filled 2018-04-20: qty 2

## 2018-04-20 MED ORDER — HYDROMORPHONE HCL 1 MG/ML IJ SOLN
0.2500 mg | INTRAMUSCULAR | Status: DC | PRN
  Administered 2018-04-20 (×4): 0.25 mg via INTRAVENOUS

## 2018-04-20 MED ORDER — STERILE WATER FOR IRRIGATION IR SOLN
Status: DC | PRN
  Administered 2018-04-20: 1000 mL

## 2018-04-20 MED ORDER — GABAPENTIN 300 MG PO CAPS
300.0000 mg | ORAL_CAPSULE | ORAL | Status: AC
  Administered 2018-04-20: 300 mg via ORAL
  Filled 2018-04-20: qty 1

## 2018-04-20 MED ORDER — DEXMEDETOMIDINE HCL 200 MCG/2ML IV SOLN
INTRAVENOUS | Status: DC | PRN
  Administered 2018-04-20 (×2): 8 ug via INTRAVENOUS

## 2018-04-20 MED ORDER — ROCURONIUM BROMIDE 50 MG/5ML IV SOSY
PREFILLED_SYRINGE | INTRAVENOUS | Status: AC
  Filled 2018-04-20: qty 5

## 2018-04-20 MED ORDER — OXYCODONE HCL 5 MG/5ML PO SOLN: 5.0000 mg | Freq: Once | ORAL | Status: AC | PRN

## 2018-04-20 MED ORDER — SUGAMMADEX SODIUM 200 MG/2ML IV SOLN
INTRAVENOUS | Status: DC | PRN
  Administered 2018-04-20: 180 mg via INTRAVENOUS

## 2018-04-20 MED ORDER — HYDROCODONE-ACETAMINOPHEN 5-325 MG PO TABS: 1.0000 | ORAL_TABLET | Freq: Four times a day (QID) | ORAL | 0 refills | Status: DC | PRN

## 2018-04-20 MED ORDER — DEXAMETHASONE SODIUM PHOSPHATE 10 MG/ML IJ SOLN
INTRAMUSCULAR | Status: DC | PRN
  Administered 2018-04-20: 8 mg via INTRAVENOUS

## 2018-04-20 MED ORDER — SUCCINYLCHOLINE CHLORIDE 20 MG/ML IJ SOLN
INTRAMUSCULAR | Status: DC | PRN
  Administered 2018-04-20: 80 mg via INTRAVENOUS

## 2018-04-20 SURGICAL SUPPLY — 34 items
APPLIER CLIP ROT 10 11.4 M/L (STAPLE) ×3
BLADE CLIPPER SURG (BLADE) IMPLANT
CANISTER SUCT 3000ML PPV (MISCELLANEOUS) ×3 IMPLANT
CHLORAPREP W/TINT 26ML (MISCELLANEOUS) ×3 IMPLANT
CLIP APPLIE ROT 10 11.4 M/L (STAPLE) ×1 IMPLANT
COVER SURGICAL LIGHT HANDLE (MISCELLANEOUS) ×3 IMPLANT
COVER WAND RF STERILE (DRAPES) IMPLANT
DERMABOND ADVANCED (GAUZE/BANDAGES/DRESSINGS) ×2
DERMABOND ADVANCED .7 DNX12 (GAUZE/BANDAGES/DRESSINGS) ×1 IMPLANT
ELECT REM PT RETURN 9FT ADLT (ELECTROSURGICAL) ×3
ELECTRODE REM PT RTRN 9FT ADLT (ELECTROSURGICAL) ×1 IMPLANT
GLOVE EUDERMIC 7 POWDERFREE (GLOVE) ×3 IMPLANT
GOWN STRL REUS W/ TWL LRG LVL3 (GOWN DISPOSABLE) ×2 IMPLANT
GOWN STRL REUS W/ TWL XL LVL3 (GOWN DISPOSABLE) ×1 IMPLANT
GOWN STRL REUS W/TWL LRG LVL3 (GOWN DISPOSABLE) ×4
GOWN STRL REUS W/TWL XL LVL3 (GOWN DISPOSABLE) ×2
KIT BASIN OR (CUSTOM PROCEDURE TRAY) ×3 IMPLANT
KIT TURNOVER KIT B (KITS) ×3 IMPLANT
NS IRRIG 1000ML POUR BTL (IV SOLUTION) ×3 IMPLANT
PAD ARMBOARD 7.5X6 YLW CONV (MISCELLANEOUS) ×3 IMPLANT
POUCH SPECIMEN RETRIEVAL 10MM (ENDOMECHANICALS) ×3 IMPLANT
SCISSORS LAP 5X35 DISP (ENDOMECHANICALS) ×3 IMPLANT
SET IRRIG TUBING LAPAROSCOPIC (IRRIGATION / IRRIGATOR) ×3 IMPLANT
SET TUBE SMOKE EVAC HIGH FLOW (TUBING) ×3 IMPLANT
SLEEVE ENDOPATH XCEL 5M (ENDOMECHANICALS) ×3 IMPLANT
SPECIMEN JAR SMALL (MISCELLANEOUS) ×3 IMPLANT
SUT MNCRL AB 4-0 PS2 18 (SUTURE) ×3 IMPLANT
TOWEL OR 17X24 6PK STRL BLUE (TOWEL DISPOSABLE) ×3 IMPLANT
TOWEL OR 17X26 10 PK STRL BLUE (TOWEL DISPOSABLE) ×3 IMPLANT
TRAY LAPAROSCOPIC MC (CUSTOM PROCEDURE TRAY) ×3 IMPLANT
TROCAR XCEL BLUNT TIP 100MML (ENDOMECHANICALS) ×3 IMPLANT
TROCAR XCEL NON-BLD 11X100MML (ENDOMECHANICALS) ×3 IMPLANT
TROCAR XCEL NON-BLD 5MMX100MML (ENDOMECHANICALS) ×3 IMPLANT
WATER STERILE IRR 1000ML POUR (IV SOLUTION) ×3 IMPLANT

## 2018-04-20 NOTE — Anesthesia Procedure Notes (Signed)
Procedure Name: Intubation Performed by: Milford Cage, CRNA Pre-anesthesia Checklist: Patient identified, Emergency Drugs available, Suction available and Patient being monitored Patient Re-evaluated:Patient Re-evaluated prior to induction Oxygen Delivery Method: Circle System Utilized Preoxygenation: Pre-oxygenation with 100% oxygen Induction Type: IV induction and Rapid sequence Laryngoscope Size: Mac and 3 Grade View: Grade II Tube type: Oral Tube size: 7.0 mm Number of attempts: 1 Airway Equipment and Method: Stylet and Oral airway Placement Confirmation: ETT inserted through vocal cords under direct vision,  positive ETCO2 and breath sounds checked- equal and bilateral Secured at: 22 cm Tube secured with: Tape Dental Injury: Teeth and Oropharynx as per pre-operative assessment

## 2018-04-20 NOTE — Anesthesia Preprocedure Evaluation (Addendum)
Anesthesia Evaluation  Patient identified by MRN, date of birth, ID band Patient awake    Reviewed: Allergy & Precautions, NPO status , Patient's Chart, lab work & pertinent test results  History of Anesthesia Complications (+) PONV and history of anesthetic complications  Airway Mallampati: II  TM Distance: >3 FB Neck ROM: Full    Dental  (+) Missing   Pulmonary Current Smoker (vaping), former smoker,    Pulmonary exam normal breath sounds clear to auscultation       Cardiovascular negative cardio ROS Normal cardiovascular exam Rhythm:Regular Rate:Normal  ECG: SR, rate 76   Neuro/Psych  Headaches, Anxiety Vertebral artery disease     GI/Hepatic Neg liver ROS, GERD  Medicated and Controlled,  Endo/Other  negative endocrine ROS  Renal/GU negative Renal ROS     Musculoskeletal  (+) Fibromyalgia -  Abdominal   Peds  Hematology negative hematology ROS (+)   Anesthesia Other Findings Gallstones  Reproductive/Obstetrics                            Anesthesia Physical Anesthesia Plan  ASA: II  Anesthesia Plan: General   Post-op Pain Management:    Induction: Intravenous  PONV Risk Score and Plan: 4 or greater and Midazolam, Dexamethasone, Ondansetron, Treatment may vary due to age or medical condition and Scopolamine patch - Pre-op  Airway Management Planned: Oral ETT  Additional Equipment:   Intra-op Plan:   Post-operative Plan: Extubation in OR  Informed Consent: I have reviewed the patients History and Physical, chart, labs and discussed the procedure including the risks, benefits and alternatives for the proposed anesthesia with the patient or authorized representative who has indicated his/her understanding and acceptance.     Dental advisory given  Plan Discussed with: CRNA  Anesthesia Plan Comments:         Anesthesia Quick Evaluation

## 2018-04-20 NOTE — Op Note (Signed)
Patient Name:           [name]   Date of Surgery:        04/20/2018  Pre op Diagnosis:      Chronic cholecystitis with cholelithiasis  Post op Diagnosis:    Same  Procedure:                 Laparoscopic cholecystectomy  Surgeon:                     Angelia Mould. Derrell [name], M.D., FACS  Assistant:                      Or staff  Operative Indications:   This is a pleasant 53 year old female from [place]. She is referred by Dr. Carman [name] for symptomatic gallstones. [name] is her PCP.      She says that she's had episodes of right-sided pain and nausea vomiting and diarrhea in the past but it was never evaluated. She had another episode on February 21. Things got worse and she went to the [facility] emergency Department on February 28 where CT scan was normal. Ultrasound showed gallstones with largest being 1.2 cm, and lab work was normal. Lab work was repeated immediately preop today and it is also normal.  She was discharged home and saw Dr. Randa [name] who felt that she should be considered for cholecystectomy. The patient states that she still has the right-sided pain has mild nausea and is anorexic and avoiding food. No further emesis for a couple of weeks and the diarrhea is getting better. The diarrhea clearly seems to be related to the episodes of pain and is not a chronic problem.      Past history includes vertebral artery disease. 2008 was having headaches. Told she had vertebral artery aneurysm. Reports angiogram and being told nothing was a problem former tobacco abuse quit 2 years ago. Fibromyalgia. Headaches. Vaginal hysterectomy, kidney by bleeding requiring Pfannenstiel incision and control. She does not know whether her ovaries removed.        She very much wants to go ahead with cholecystectomy and I think that is appropriate. Her symptoms are fairly typical and persistent. Because of daily pain and nausea this is not elective surgery and needs to  be scheduled urgently   Operative Findings:       The gallbladder was discolored but was not acutely inflamed.  It was relatively thin-walled.  The liver looks normal.  The cystic duct was extremely tiny.  We created a large critical view.  I chose not to do a cholangiogram because the cystic duct was tiny and I had good definition of anatomy and the liver function tests are normal.  The liver looked healthy.  Four-quadrant inspection at the beginning of the case and the end of the case showed no other abnormality.  Procedure in Detail:          Following the induction of general endotracheal anesthesia the patient's abdomen was prepped and draped in a sterile fashion.  Surgical timeout was performed.  Intravenous antibiotics were given.  0.25% Marcaine with epinephrine was used as a local infiltration anesthetic.  A vertical incision was made in the lower rim of the umbilicus.  The fascia was incised in the midline and the abdominal cavity entered under direct vision.  A Hassan trocar was inserted and secured with a pursestring suture of 0 Vicryl.  Pneumoperitoneum was created and video  camera was inserted.  A trocar was placed in the subxiphoid region and 2 5 mm trochars placed in the right upper quadrant.  The gallbladder fundus was elevated.  The infundibulum was retracted laterally.  I incised the peritoneum overlying the neck of the gallbladder and then slowly dissected out the cystic duct and the cystic artery.  A large window was created.  The structures were controlled with multiple metal clips and divided.  The gallbladder was then dissected from its bed with electrocautery placed in a specimen bag and removed.  Hemostasis was excellent.  The operative field was irrigated.  There was no evidence of bleeding or bile leak.  The intra-abdominal gas was siphoned off and then the trochars were removed.  The fascia at the umbilicus was closed with 0 Vicryl sutures and the skin closed with a subcuticular  sutures of 4-0 Monocryl and Dermabond.  The patient tolerated the procedure well and was taken to PACU in stable condition.  EBL 10 cc.  Counts correct.  Complications none.    Addendum: I logged onto the PMP aware website and reviewed her prescription medication history     [name], M.D., FACS General and Minimally Invasive Surgery Breast and Colorectal Surgery  04/20/2018 11:49 AM

## 2018-04-20 NOTE — Interval H&P Note (Signed)
History and Physical Interval Note:  04/20/2018 8:25 AM  [name]  has presented today for surgery, with the diagnosis of Gallstones.  The various methods of treatment have been discussed with the patient and family. After consideration of risks, benefits and other options for treatment, the patient has consented to  Procedure(s): LAPAROSCOPIC CHOLECYSTECTOMY WITH POSSIBLE INTRAOPERATIVE CHOLANGIOGRAM (N/A) as a surgical intervention.  The patient's history has been reviewed, patient examined, no change in status, stable for surgery.  I have reviewed the patient's chart and labs.  Questions were answered to the patient's satisfaction.     Ernestene Mention

## 2018-04-20 NOTE — Discharge Instructions (Signed)
[facility] ______[facility] °LAPAROSCOPIC SURGERY: POST OP INSTRUCTIONS °Always review your discharge instruction sheet given to you by the facility where your surgery was performed. °IF YOU HAVE DISABILITY OR FAMILY LEAVE FORMS, YOU MUST BRING THEM TO THE OFFICE FOR PROCESSING.   °DO NOT GIVE THEM TO YOUR DOCTOR. ° °1. A prescription for pain medication may be given to you upon discharge.  Take your pain medication as prescribed, if needed.  If narcotic pain medicine is not needed, then you may take acetaminophen (Tylenol) or ibuprofen (Advil) as needed. °2. Take your usually prescribed medications unless otherwise directed. °3. If you need a refill on your pain medication, please contact your pharmacy.  They will contact our office to request authorization. Prescriptions will not be filled after 5pm or on week-ends. °4. You should follow a light diet the first few days after arrival home, such as soup and crackers, etc.  Be sure to include lots of fluids daily. °5. Most patients will experience some swelling and bruising in the area of the incisions.  Ice packs will help.  Swelling and bruising can take several days to resolve.  °6. It is common to experience some constipation if taking pain medication after surgery.  Increasing fluid intake and taking a stool softener (such as Colace) will usually help or prevent this problem from occurring.  A mild laxative (Milk of Magnesia or Miralax) should be taken according to package instructions if there are no bowel movements after 48 hours. °7. Unless discharge instructions indicate otherwise, you may remove your bandages 24-48 hours after surgery, and you may shower at that time.  You may have steri-strips (small skin tapes) in place directly over the incision.  These strips should be left on the skin for 7-10 days.  If your surgeon used skin glue on the incision, you may shower in 24 hours.  The glue will flake off over the next 2-3 weeks.  Any sutures or  staples will be removed at the office during your follow-up visit. °8. ACTIVITIES:  You may resume regular (light) daily activities beginning the next day--such as daily self-care, walking, climbing stairs--gradually increasing activities as tolerated.  You may have sexual intercourse when it is comfortable.  Refrain from any heavy lifting or straining until approved by your doctor. °a. You may drive when you are no longer taking prescription pain medication, you can comfortably wear a seatbelt, and you can safely maneuver your car and apply brakes. °b. RETURN TO WORK:  __________________________________________________________ °9. You should see your doctor in the office for a follow-up appointment approximately 2-3 weeks after your surgery.  Make sure that you call for this appointment within a day or two after you arrive home to insure a convenient appointment time. °10. OTHER INSTRUCTIONS: __________________________________________________________________________________________________________________________ __________________________________________________________________________________________________________________________ °WHEN TO CALL YOUR DOCTOR: °1. Fever over 101.0 °2. Inability to urinate °3. Continued bleeding from incision. °4. Increased pain, redness, or drainage from the incision. °5. Increasing abdominal pain ° °The clinic staff is available to answer your questions during regular business hours.  Please don’t hesitate to call and ask to speak to one of the nurses for clinical concerns.  If you have a medical emergency, go to the nearest emergency room or call 911.  A surgeon from [facility] is always on call at the hospital. °[address], [address], [place], [place]  [address] ? [address] [address], [place], [place]   [address] °[phone] ? [phone] ? FAX [phone] °Web site:   [email or web address] ° °••••••••• ° ° °Managing Your Pain After Surgery Without  Opioids ° ° ° °Thank you for participating in our program to help patients manage their pain after surgery without opioids. This is part of our effort to provide you with the best care possible, without exposing you or your family to the risk that opioids pose. ° °What pain can I expect after surgery? °You can expect to have some pain after surgery. This is normal. The pain is typically worse the day after surgery, and quickly begins to get better. °Many studies have found that many patients are able to manage their pain after surgery with Over-the-Counter (OTC) medications such as Tylenol and Motrin. If you have a condition that does not allow you to take Tylenol or Motrin, notify your surgical team. ° °How will I manage my pain? °The best strategy for controlling your pain after surgery is around the clock pain control with Tylenol (acetaminophen) and Motrin (ibuprofen or Advil). Alternating these medications with each other allows you to maximize your pain control. In addition to Tylenol and Motrin, you can use heating pads or ice packs on your incisions to help reduce your pain. ° °How will I alternate your regular strength over-the-counter pain medication? °You will take a dose of pain medication every three hours. °; Start by taking 650 mg of Tylenol (2 pills of 325 mg) °; 3 hours later take 600 mg of Motrin (3 pills of 200 mg) °; 3 hours after taking the Motrin take 650 mg of Tylenol °; 3 hours after that take 600 mg of Motrin. ° ° °- 1 - ° °See example - if your first dose of Tylenol is at 12:00 PM ° ° °12:00 PM Tylenol 650 mg (2 pills of 325 mg)  °3:00 PM Motrin 600 mg (3 pills of 200 mg)  °6:00 PM Tylenol 650 mg (2 pills of 325 mg)  °9:00 PM Motrin 600 mg (3 pills of 200 mg)  °Continue alternating every 3 hours  ° °We recommend that you follow this schedule around-the-clock for at least 3 days after surgery, or until you feel that it is no longer needed. Use the table on the last page of this handout to  keep track of the medications you are taking. °Important: °Do not take more than 3000mg of Tylenol or 3200mg of Motrin in a 24-hour period. °Do not take ibuprofen/Motrin if you have a history of bleeding stomach ulcers, severe kidney disease, &/or actively taking a blood thinner ° °What if I still have pain? °If you have pain that is not controlled with the over-the-counter pain medications (Tylenol and Motrin or Advil) you might have what we call “breakthrough” pain. You will receive a prescription for a small amount of an opioid pain medication such as Oxycodone, Tramadol, or Tylenol with Codeine. Use these opioid pills in the first 24 hours after surgery if you have breakthrough pain. Do not take more than 1 pill every 4-6 hours. ° °If you still have uncontrolled pain after using all opioid pills, don't hesitate to call our staff using the number provided. We will help make sure you are managing your pain in the best way possible, and if necessary, we can provide a prescription for additional pain medication. ° ° °Day 1   ° °Time  °Name of Medication Number of pills taken  °Amount of Acetaminophen  °Pain Level  ° °Comments  °AM PM       °AM PM       °  AM PM       °AM PM       °AM PM       °AM PM       °AM PM       °AM PM       °Total Daily amount of Acetaminophen °Do not take more than  3,000 mg per day    ° ° °Day 2   ° °Time  °Name of Medication Number of pills °taken  °Amount of Acetaminophen  °Pain Level  ° °Comments  °AM PM       °AM PM       °AM PM       °AM PM       °AM PM       °AM PM       °AM PM       °AM PM       °Total Daily amount of Acetaminophen °Do not take more than  3,000 mg per day    ° ° °Day 3   ° °Time  °Name of Medication Number of pills taken  °Amount of Acetaminophen  °Pain Level  ° °Comments  °AM PM       °AM PM       °AM PM       °AM PM       ° ° ° °AM PM       °AM PM       °AM PM       °AM PM       °Total Daily amount of Acetaminophen °Do not take more than  3,000 mg per day    ° ° °Day  4   ° °Time  °Name of Medication Number of pills taken  °Amount of Acetaminophen  °Pain Level  ° °Comments  °AM PM       °AM PM       °AM PM       °AM PM       °AM PM       °AM PM       °AM PM       °AM PM       °Total Daily amount of Acetaminophen °Do not take more than  3,000 mg per day    ° ° °Day 5   ° °Time  °Name of Medication Number °of pills taken  °Amount of Acetaminophen  °Pain Level  ° °Comments  °AM PM       °AM PM       °AM PM       °AM PM       °AM PM       °AM PM       °AM PM       °AM PM       °Total Daily amount of Acetaminophen °Do not take more than  3,000 mg per day    ° ° ° °Day 6   ° °Time  °Name of Medication Number of pills °taken  °Amount of Acetaminophen  °Pain Level  °Comments  °AM PM       °AM PM       °AM PM       °AM PM       °AM PM       °AM PM       °AM PM       °AM PM       °Total Daily amount   of Acetaminophen °Do not take more than  3,000 mg per day    ° ° °Day 7   ° °Time  °Name of Medication Number of pills taken  °Amount of Acetaminophen  °Pain Level  ° °Comments  °AM PM       °AM PM       °AM PM       °AM PM       °AM PM       °AM PM       °AM PM       °AM PM       °Total Daily amount of Acetaminophen °Do not take more than  3,000 mg per day    ° ° ° ° °For additional information about how and where to safely dispose of unused opioid °medications - https://www.morepowerful[employer].org ° °Disclaimer: This document contains information and/or instructional materials adapted from [facility] for the typical patient with your condition. It does not replace medical advice from your health care provider because your experience may differ from that of the °typical patient. Talk to your health care provider if you have any questions about this °document, your condition or your treatment plan. °Adapted from [facility] ° ° °

## 2018-04-20 NOTE — Transfer of Care (Signed)
Immediate Anesthesia Transfer of Care Note  Patient: Laura [name]  Procedure(s) Performed: LAPAROSCOPIC CHOLECYSTECTOMY (N/A Abdomen)  Patient Location: PACU  Anesthesia Type:General  Level of Consciousness: drowsy  Airway & Oxygen Therapy: Patient Spontanous Breathing and Patient connected to face mask oxygen  Post-op Assessment: Report given to RN and Post -op Vital signs reviewed and stable  Post vital signs: Reviewed and stable  Last Vitals:  Vitals Value Taken Time  BP 120/73 04/20/2018 11:59 AM  Temp    Pulse 102 04/20/2018 12:01 PM  Resp 11 04/20/2018 12:01 PM  SpO2 100 % 04/20/2018 12:01 PM  Vitals shown include unvalidated device data.  Last Pain:  Vitals:   04/20/18 0852  TempSrc:   PainSc: 7       Patients Stated Pain Goal: 4 (04/20/18 7616)  Complications: No apparent anesthesia complications

## 2018-04-20 NOTE — Anesthesia Postprocedure Evaluation (Signed)
Anesthesia Post Note  Patient: Laura [name]  Procedure(s) Performed: LAPAROSCOPIC CHOLECYSTECTOMY (N/A Abdomen)     Patient location during evaluation: PACU Anesthesia Type: General Level of consciousness: awake and alert Pain management: pain level controlled Vital Signs Assessment: post-procedure vital signs reviewed and stable Respiratory status: spontaneous breathing, nonlabored ventilation, respiratory function stable and patient connected to nasal cannula oxygen Cardiovascular status: blood pressure returned to baseline and stable Postop Assessment: no apparent nausea or vomiting Anesthetic complications: no    Last Vitals:  Vitals:   04/20/18 1244 04/20/18 1305  BP: 132/72 (!) 143/88  Pulse: 71 80  Resp: 12 16  Temp:  (!) 36.1 C  SpO2: 99% 100%    Last Pain:  Vitals:   04/20/18 1259  TempSrc:   PainSc: 5                  Ryan P Ellender

## 2018-04-21 ENCOUNTER — Encounter (HOSPITAL_COMMUNITY): Payer: Self-pay | Admitting: General Surgery

## 2018-06-27 DIAGNOSIS — Z1211 Encounter for screening for malignant neoplasm of colon: Secondary | ICD-10-CM | POA: Diagnosis not present

## 2018-06-27 DIAGNOSIS — Z0000 Encounter for general adult medical examination without abnormal findings: Secondary | ICD-10-CM | POA: Diagnosis not present

## 2018-06-27 DIAGNOSIS — F411 Generalized anxiety disorder: Secondary | ICD-10-CM | POA: Diagnosis not present

## 2018-06-27 DIAGNOSIS — K8020 Calculus of gallbladder without cholecystitis without obstruction: Secondary | ICD-10-CM | POA: Diagnosis not present

## 2018-07-01 DIAGNOSIS — D1801 Hemangioma of skin and subcutaneous tissue: Secondary | ICD-10-CM | POA: Diagnosis not present

## 2018-07-01 DIAGNOSIS — C4359 Malignant melanoma of other part of trunk: Secondary | ICD-10-CM | POA: Diagnosis not present

## 2018-07-07 DIAGNOSIS — L988 Other specified disorders of the skin and subcutaneous tissue: Secondary | ICD-10-CM | POA: Diagnosis not present

## 2018-07-07 DIAGNOSIS — C4359 Malignant melanoma of other part of trunk: Secondary | ICD-10-CM | POA: Diagnosis not present

## 2018-07-21 DIAGNOSIS — D485 Neoplasm of uncertain behavior of skin: Secondary | ICD-10-CM | POA: Diagnosis not present

## 2018-07-21 DIAGNOSIS — L814 Other melanin hyperpigmentation: Secondary | ICD-10-CM | POA: Diagnosis not present

## 2018-07-21 DIAGNOSIS — D2261 Melanocytic nevi of right upper limb, including shoulder: Secondary | ICD-10-CM | POA: Diagnosis not present

## 2018-07-21 DIAGNOSIS — D2262 Melanocytic nevi of left upper limb, including shoulder: Secondary | ICD-10-CM | POA: Diagnosis not present

## 2018-07-21 DIAGNOSIS — Z85820 Personal history of malignant melanoma of skin: Secondary | ICD-10-CM | POA: Diagnosis not present

## 2018-07-21 DIAGNOSIS — D225 Melanocytic nevi of trunk: Secondary | ICD-10-CM | POA: Diagnosis not present

## 2018-07-27 DIAGNOSIS — F411 Generalized anxiety disorder: Secondary | ICD-10-CM | POA: Diagnosis not present

## 2018-07-27 DIAGNOSIS — F5104 Psychophysiologic insomnia: Secondary | ICD-10-CM | POA: Diagnosis not present

## 2018-08-04 DIAGNOSIS — J029 Acute pharyngitis, unspecified: Secondary | ICD-10-CM | POA: Diagnosis not present

## 2018-10-10 DIAGNOSIS — D2271 Melanocytic nevi of right lower limb, including hip: Secondary | ICD-10-CM | POA: Diagnosis not present

## 2018-10-10 DIAGNOSIS — L814 Other melanin hyperpigmentation: Secondary | ICD-10-CM | POA: Diagnosis not present

## 2018-10-10 DIAGNOSIS — Z85820 Personal history of malignant melanoma of skin: Secondary | ICD-10-CM | POA: Diagnosis not present

## 2018-10-10 DIAGNOSIS — D2272 Melanocytic nevi of left lower limb, including hip: Secondary | ICD-10-CM | POA: Diagnosis not present

## 2018-10-10 DIAGNOSIS — D225 Melanocytic nevi of trunk: Secondary | ICD-10-CM | POA: Diagnosis not present

## 2018-10-10 DIAGNOSIS — D485 Neoplasm of uncertain behavior of skin: Secondary | ICD-10-CM | POA: Diagnosis not present

## 2018-11-02 NOTE — ED Provider Notes (Signed)
[facility]    CSN: [id number] Arrival date & time: 11/02/16  1627      History   Chief Complaint Chief Complaint  Patient presents with  . URI    HPI Laura [name] is a 53 y.o. female.   Pt c/o cough and congestion fr nearly a week. Denies fever.      Past Medical History:  Diagnosis Date  . Anxiety   . Arthritis   . Complication of anesthesia   . Fibromyalgia   . Gallstones 04/20/2018  . GERD (gastroesophageal reflux disease)   . History of blood transfusion    post Hysterectomy  . Migraine   . PONV (postoperative nausea and vomiting)   . Tobacco abuse   . Vertebral artery disease (HCC)    history of left vertebral artery dissection 2011    Patient Active Problem List   Diagnosis Date Noted  . Gallstones 04/20/2018  . Tobacco abuse 05/18/2014  . Vertebral artery disease (HCC) 05/18/2014  . Heel pain 06/12/2010  . ACHILLES TENDINITIS 01/07/2010  . DYSURIA 01/07/2010  . MIGRAINE HEADACHE 11/14/2009  . DISC DISEASE, CERVICAL 11/14/2009    Past Surgical History:  Procedure Laterality Date  . ABDOMINAL HYSTERECTOMY    . ANTERIOR FUSION CERVICAL SPINE  2008  . BONE GRAFT HIP ILIAC CREST Left    Left Hip to Left Thumb  . CHOLECYSTECTOMY N/A 04/20/2018   Procedure: LAPAROSCOPIC CHOLECYSTECTOMY;  Surgeon: Claud Kelp, MD;  Location[facility] OR;  Service: General;  Laterality: N/A;  . thumb surgery Left 2018   [facility]- Tendons Carpal tunnel  . WRIST SURGERY  1994    OB History   No obstetric history on file.      Home Medications    Prior to Admission medications   Medication Sig Start Date End Date Taking? Authorizing Provider  HYDROcodone-acetaminophen (NORCO) 5-325 MG tablet Take 1-2 tablets by mouth every 6 (six) hours as needed for moderate pain or severe pain. 04/20/18   Claud Kelp, MD  omeprazole (PRILOSEC) 40 MG capsule Take 40 mg by mouth daily.    [provider]  promethazine (PHENERGAN) 25 MG tablet Take 25 mg by  mouth every 6 (six) hours as needed for nausea or vomiting.    [provider]    Family History Family History  Problem Relation Age of Onset  . Lung cancer Father   . Breast cancer Sister     Social History Social History   Tobacco Use  . Smoking status: Former Smoker    Packs/day: 1.00    Years: 20.00    Pack years: 20.00    Types: Cigarettes    Quit date: 2018    Years since quitting: 2.7  . Smokeless tobacco: Never Used  Substance Use Topics  . Alcohol use: No    Alcohol/week: 0.0 standard drinks  . Drug use: No     Allergies   Aspirin   Review of Systems Review of Systems  Constitutional: Negative for chills and fever.  HENT: Positive for congestion. Negative for sore throat and tinnitus.   Eyes: Negative for redness.  Respiratory: Positive for cough (non-productive). Negative for shortness of breath.   Cardiovascular: Negative for chest pain and palpitations.  Gastrointestinal: Negative for abdominal pain, diarrhea, nausea and vomiting.  Genitourinary: Negative for dysuria, frequency and urgency.  Musculoskeletal: Negative for myalgias.  Skin: Negative for rash.       No lesions  Neurological: Negative for weakness.  Hematological: Does not bruise/bleed  easily.  Psychiatric/Behavioral: Negative for suicidal ideas.     Physical Exam Triage Vital Signs ED Triage Vitals [11/02/16 1709]  Enc Vitals Group     BP 106/76     Pulse Rate (!) 103     Resp 20     Temp 98.4 F (36.9 C)     Temp Source Oral     SpO2 100 %     Weight      Height      Head Circumference      Peak Flow      Pain Score 0     Pain Loc      Pain Edu?      Excl. in [place]?    No data found.  Updated Vital Signs BP 106/76 (BP Location: Right Arm)   Pulse (!) 103   Temp 98.4 F (36.9 C) (Oral)   Resp 20   SpO2 100%   Visual Acuity Right Eye Distance:   Left Eye Distance:   Bilateral Distance:    Right Eye Near:   Left Eye Near:    Bilateral Near:      Physical Exam Vitals signs and nursing note reviewed.  Constitutional:      General: She is not in acute distress.    Appearance: She is well-developed.  HENT:     Head: Normocephalic and atraumatic.  Eyes:     General: No scleral icterus.    Conjunctiva/sclera: Conjunctivae normal.     Pupils: Pupils are equal, round, and reactive to light.  Neck:     Musculoskeletal: Normal range of motion and neck supple.     Thyroid: No thyromegaly.     Vascular: No JVD.     Trachea: No tracheal deviation.  Cardiovascular:     Rate and Rhythm: Normal rate and regular rhythm.     Heart sounds: Normal heart sounds. No murmur. No friction rub. No gallop.   Pulmonary:     Effort: Pulmonary effort is normal.     Breath sounds: Normal breath sounds.  Abdominal:     General: Bowel sounds are normal. There is no distension.     Palpations: Abdomen is soft.     Tenderness: There is no abdominal tenderness.  Musculoskeletal: Normal range of motion.  Lymphadenopathy:     Cervical: No cervical adenopathy.  Skin:    General: Skin is warm and dry.  Neurological:     Mental Status: She is alert and oriented to person, place, and time.     Cranial Nerves: No cranial nerve deficit.  Psychiatric:        Behavior: Behavior normal.        Thought Content: Thought content normal.        Judgment: Judgment normal.      UC Treatments / Results  Labs (all labs ordered are listed, but only abnormal results are displayed) Labs Reviewed - No data to display  EKG   Radiology No results found.  Procedures Procedures (including critical care time)  Medications Ordered in UC Medications - No data to display  Initial Impression / Assessment and Plan / UC Course  I have reviewed the triage vital signs and the nursing notes.  Pertinent labs & imaging results that were available during my care of the patient were reviewed by me and considered in my medical decision making (see chart for details).      Sinus tenderness indicates sinusitis. Rx abx and nasal steroid Final Clinical Impressions(s) / UC Diagnoses  Final diagnoses:  Subacute frontal sinusitis   Discharge Instructions   None    ED Prescriptions    Medication Sig Dispense Auth. Provider   amoxicillin-clavulanate (AUGMENTIN) 875-125 MG tablet Take 1 tablet by mouth 2 (two) times daily. 20 tablet Arnaldo Nataliamond, Ilian Wessell S, MD   mometasone (NASONEX) 50 MCG/ACT nasal spray Place 2 sprays into the nose daily. 17 g Arnaldo Nataliamond, Zaydah Nawabi S, MD     PDMP not reviewed this encounter.   Arnaldo Nataliamond, Loye Vento S, MD 11/02/18 [phone]

## 2018-12-09 DIAGNOSIS — N951 Menopausal and female climacteric states: Secondary | ICD-10-CM | POA: Diagnosis not present

## 2018-12-09 DIAGNOSIS — M797 Fibromyalgia: Secondary | ICD-10-CM | POA: Diagnosis not present

## 2019-01-02 DIAGNOSIS — R232 Flushing: Secondary | ICD-10-CM | POA: Diagnosis not present

## 2019-01-02 DIAGNOSIS — R6882 Decreased libido: Secondary | ICD-10-CM | POA: Diagnosis not present

## 2019-01-02 DIAGNOSIS — N898 Other specified noninflammatory disorders of vagina: Secondary | ICD-10-CM | POA: Diagnosis not present

## 2019-01-09 DIAGNOSIS — R102 Pelvic and perineal pain: Secondary | ICD-10-CM | POA: Diagnosis not present

## 2019-01-13 DIAGNOSIS — B029 Zoster without complications: Secondary | ICD-10-CM | POA: Diagnosis not present

## 2019-02-28 DIAGNOSIS — R232 Flushing: Secondary | ICD-10-CM | POA: Diagnosis not present

## 2019-02-28 DIAGNOSIS — R6882 Decreased libido: Secondary | ICD-10-CM | POA: Diagnosis not present

## 2019-02-28 DIAGNOSIS — N942 Vaginismus: Secondary | ICD-10-CM | POA: Diagnosis not present

## 2019-03-13 DIAGNOSIS — F5104 Psychophysiologic insomnia: Secondary | ICD-10-CM | POA: Diagnosis not present

## 2019-03-13 DIAGNOSIS — N942 Vaginismus: Secondary | ICD-10-CM | POA: Diagnosis not present

## 2019-03-13 DIAGNOSIS — M797 Fibromyalgia: Secondary | ICD-10-CM | POA: Diagnosis not present

## 2019-03-13 DIAGNOSIS — F411 Generalized anxiety disorder: Secondary | ICD-10-CM | POA: Diagnosis not present

## 2019-04-05 DIAGNOSIS — Z01818 Encounter for other preprocedural examination: Secondary | ICD-10-CM | POA: Diagnosis not present

## 2019-04-05 DIAGNOSIS — R1084 Generalized abdominal pain: Secondary | ICD-10-CM | POA: Diagnosis not present

## 2019-04-05 DIAGNOSIS — R110 Nausea: Secondary | ICD-10-CM | POA: Diagnosis not present

## 2019-04-05 DIAGNOSIS — R197 Diarrhea, unspecified: Secondary | ICD-10-CM | POA: Diagnosis not present

## 2019-04-12 DIAGNOSIS — R42 Dizziness and giddiness: Secondary | ICD-10-CM | POA: Diagnosis not present

## 2019-04-13 DIAGNOSIS — G4700 Insomnia, unspecified: Secondary | ICD-10-CM | POA: Diagnosis not present

## 2019-04-21 DIAGNOSIS — R42 Dizziness and giddiness: Secondary | ICD-10-CM | POA: Diagnosis not present

## 2019-04-21 DIAGNOSIS — Z1159 Encounter for screening for other viral diseases: Secondary | ICD-10-CM | POA: Diagnosis not present

## 2019-04-21 DIAGNOSIS — I951 Orthostatic hypotension: Secondary | ICD-10-CM | POA: Diagnosis not present

## 2019-04-21 DIAGNOSIS — M26623 Arthralgia of bilateral temporomandibular joint: Secondary | ICD-10-CM | POA: Diagnosis not present

## 2019-04-26 DIAGNOSIS — K922 Gastrointestinal hemorrhage, unspecified: Secondary | ICD-10-CM | POA: Diagnosis not present

## 2019-04-26 DIAGNOSIS — K2990 Gastroduodenitis, unspecified, without bleeding: Secondary | ICD-10-CM | POA: Diagnosis not present

## 2019-04-26 DIAGNOSIS — R1013 Epigastric pain: Secondary | ICD-10-CM | POA: Diagnosis not present

## 2019-04-26 DIAGNOSIS — K2930 Chronic superficial gastritis without bleeding: Secondary | ICD-10-CM | POA: Diagnosis not present

## 2019-04-26 DIAGNOSIS — Z1211 Encounter for screening for malignant neoplasm of colon: Secondary | ICD-10-CM | POA: Diagnosis not present

## 2019-04-26 DIAGNOSIS — D122 Benign neoplasm of ascending colon: Secondary | ICD-10-CM | POA: Diagnosis not present

## 2019-04-27 ENCOUNTER — Ambulatory Visit
Admission: RE | Admit: 2019-04-27 | Discharge: 2019-04-27 | Disposition: A | Discharge status: Home / Self Care | Payer: Managed Care, Other (non HMO) | Source: Ambulatory Visit | Attending: Gastroenterology | Admitting: Gastroenterology

## 2019-04-27 ENCOUNTER — Other Ambulatory Visit: Payer: Self-pay | Admitting: Gastroenterology

## 2019-04-27 DIAGNOSIS — R140 Abdominal distension (gaseous): Secondary | ICD-10-CM | POA: Diagnosis not present

## 2019-04-27 DIAGNOSIS — R1084 Generalized abdominal pain: Secondary | ICD-10-CM

## 2019-04-27 DIAGNOSIS — R1031 Right lower quadrant pain: Secondary | ICD-10-CM | POA: Diagnosis not present

## 2019-04-27 DIAGNOSIS — R1011 Right upper quadrant pain: Secondary | ICD-10-CM | POA: Diagnosis not present

## 2019-05-02 DIAGNOSIS — F419 Anxiety disorder, unspecified: Secondary | ICD-10-CM | POA: Diagnosis not present

## 2019-05-10 DIAGNOSIS — R197 Diarrhea, unspecified: Secondary | ICD-10-CM | POA: Diagnosis not present

## 2019-05-15 DIAGNOSIS — G4700 Insomnia, unspecified: Secondary | ICD-10-CM | POA: Diagnosis not present

## 2019-05-30 ENCOUNTER — Other Ambulatory Visit: Payer: Self-pay | Admitting: Gastroenterology

## 2019-05-30 DIAGNOSIS — R1031 Right lower quadrant pain: Secondary | ICD-10-CM | POA: Diagnosis not present

## 2019-05-30 DIAGNOSIS — K5904 Chronic idiopathic constipation: Secondary | ICD-10-CM | POA: Diagnosis not present

## 2019-05-30 DIAGNOSIS — R1314 Dysphagia, pharyngoesophageal phase: Secondary | ICD-10-CM | POA: Diagnosis not present

## 2019-05-30 DIAGNOSIS — Z86010 Personal history of colonic polyps: Secondary | ICD-10-CM | POA: Diagnosis not present

## 2019-05-31 ENCOUNTER — Ambulatory Visit
Admission: RE | Admit: 2019-05-31 | Discharge: 2019-05-31 | Disposition: A | Discharge status: Home / Self Care | Payer: BC Managed Care – PPO | Source: Ambulatory Visit | Attending: Gastroenterology | Admitting: Gastroenterology

## 2019-05-31 DIAGNOSIS — K219 Gastro-esophageal reflux disease without esophagitis: Secondary | ICD-10-CM | POA: Diagnosis not present

## 2019-05-31 DIAGNOSIS — K224 Dyskinesia of esophagus: Secondary | ICD-10-CM | POA: Diagnosis not present

## 2019-05-31 DIAGNOSIS — R1314 Dysphagia, pharyngoesophageal phase: Secondary | ICD-10-CM

## 2019-11-20 DIAGNOSIS — J029 Acute pharyngitis, unspecified: Secondary | ICD-10-CM | POA: Diagnosis not present

## 2019-11-20 DIAGNOSIS — Z20822 Contact with and (suspected) exposure to covid-19: Secondary | ICD-10-CM | POA: Diagnosis not present

## 2019-12-26 DIAGNOSIS — R194 Change in bowel habit: Secondary | ICD-10-CM | POA: Diagnosis not present

## 2019-12-26 DIAGNOSIS — K219 Gastro-esophageal reflux disease without esophagitis: Secondary | ICD-10-CM | POA: Diagnosis not present

## 2020-01-02 DIAGNOSIS — N390 Urinary tract infection, site not specified: Secondary | ICD-10-CM | POA: Diagnosis not present

## 2020-01-02 DIAGNOSIS — R300 Dysuria: Secondary | ICD-10-CM | POA: Diagnosis not present

## 2020-01-11 DIAGNOSIS — G4700 Insomnia, unspecified: Secondary | ICD-10-CM | POA: Diagnosis not present

## 2020-01-11 DIAGNOSIS — N390 Urinary tract infection, site not specified: Secondary | ICD-10-CM | POA: Diagnosis not present

## 2020-02-22 DIAGNOSIS — R059 Cough, unspecified: Secondary | ICD-10-CM | POA: Diagnosis not present

## 2020-02-22 DIAGNOSIS — J029 Acute pharyngitis, unspecified: Secondary | ICD-10-CM | POA: Diagnosis not present

## 2020-02-22 DIAGNOSIS — R519 Headache, unspecified: Secondary | ICD-10-CM | POA: Diagnosis not present

## 2020-02-22 DIAGNOSIS — Z20822 Contact with and (suspected) exposure to covid-19: Secondary | ICD-10-CM | POA: Diagnosis not present

## 2020-03-04 DIAGNOSIS — R110 Nausea: Secondary | ICD-10-CM | POA: Diagnosis not present

## 2020-03-04 DIAGNOSIS — K5904 Chronic idiopathic constipation: Secondary | ICD-10-CM | POA: Diagnosis not present

## 2020-03-04 DIAGNOSIS — K219 Gastro-esophageal reflux disease without esophagitis: Secondary | ICD-10-CM | POA: Diagnosis not present

## 2020-04-01 DIAGNOSIS — K5904 Chronic idiopathic constipation: Secondary | ICD-10-CM | POA: Diagnosis not present

## 2020-04-01 DIAGNOSIS — R110 Nausea: Secondary | ICD-10-CM | POA: Diagnosis not present

## 2020-04-01 DIAGNOSIS — K2960 Other gastritis without bleeding: Secondary | ICD-10-CM | POA: Diagnosis not present

## 2020-04-01 DIAGNOSIS — Z86010 Personal history of colonic polyps: Secondary | ICD-10-CM | POA: Diagnosis not present

## 2020-07-11 DIAGNOSIS — R112 Nausea with vomiting, unspecified: Secondary | ICD-10-CM | POA: Diagnosis not present

## 2020-07-11 DIAGNOSIS — G4700 Insomnia, unspecified: Secondary | ICD-10-CM | POA: Diagnosis not present

## 2020-07-11 DIAGNOSIS — F411 Generalized anxiety disorder: Secondary | ICD-10-CM | POA: Diagnosis not present

## 2020-07-11 DIAGNOSIS — M797 Fibromyalgia: Secondary | ICD-10-CM | POA: Diagnosis not present

## 2020-08-18 DIAGNOSIS — N3001 Acute cystitis with hematuria: Secondary | ICD-10-CM | POA: Diagnosis not present

## 2020-10-21 DIAGNOSIS — L237 Allergic contact dermatitis due to plants, except food: Secondary | ICD-10-CM | POA: Diagnosis not present

## 2020-11-13 DIAGNOSIS — K219 Gastro-esophageal reflux disease without esophagitis: Secondary | ICD-10-CM | POA: Diagnosis not present

## 2020-11-13 DIAGNOSIS — F411 Generalized anxiety disorder: Secondary | ICD-10-CM | POA: Diagnosis not present

## 2020-11-13 DIAGNOSIS — R350 Frequency of micturition: Secondary | ICD-10-CM | POA: Diagnosis not present

## 2020-11-13 DIAGNOSIS — G4700 Insomnia, unspecified: Secondary | ICD-10-CM | POA: Diagnosis not present

## 2021-01-28 IMAGING — US US ABDOMEN LIMITED
1 series · 14 of 25 positions shown · non-contrast
Comparison: CT abdomen and pelvis 03/25/2018

CLINICAL DATA: Right upper quadrant abdominal pain.

EXAM:
ULTRASOUND ABDOMEN LIMITED RIGHT UPPER QUADRANT

[Series 1: us abdomen limited · 14 of 44 slices shown]
[im 1/44]
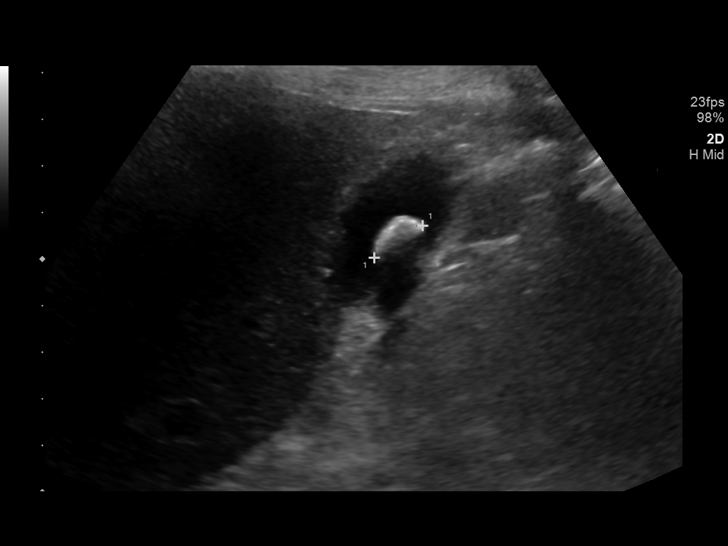
[im 4/44]
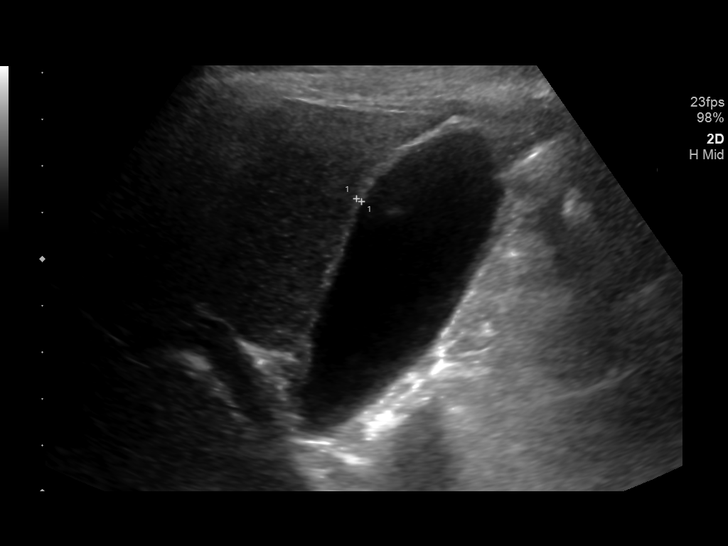
[im 8/44]
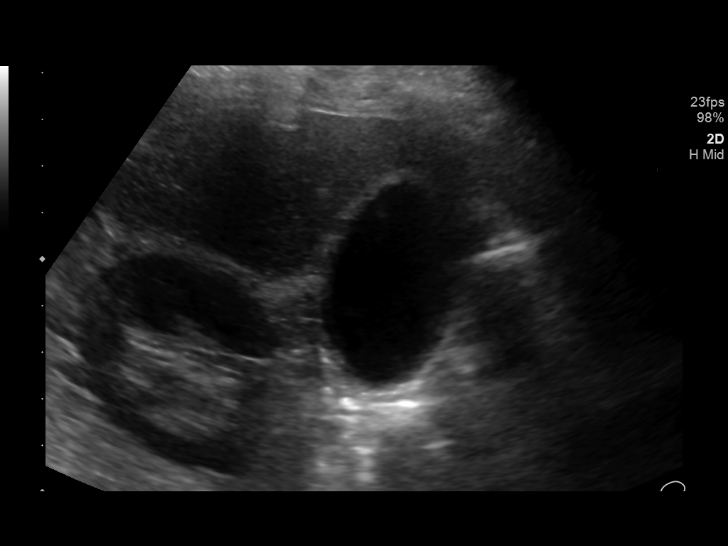
[im 11/44]
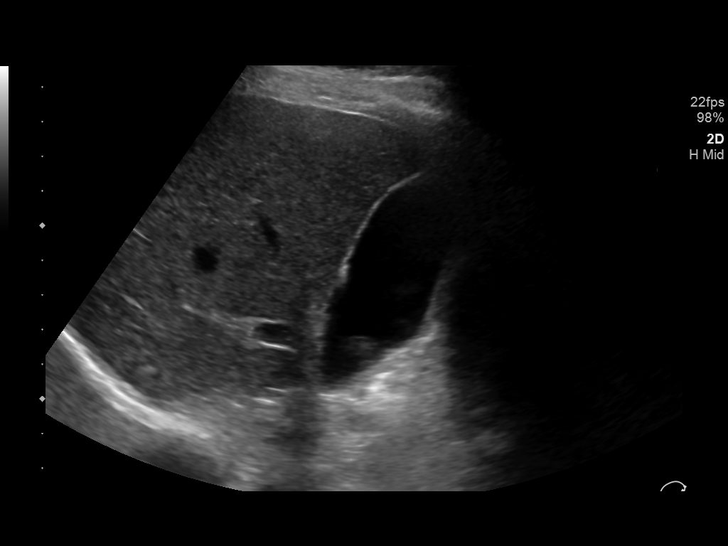
[im 15/44]
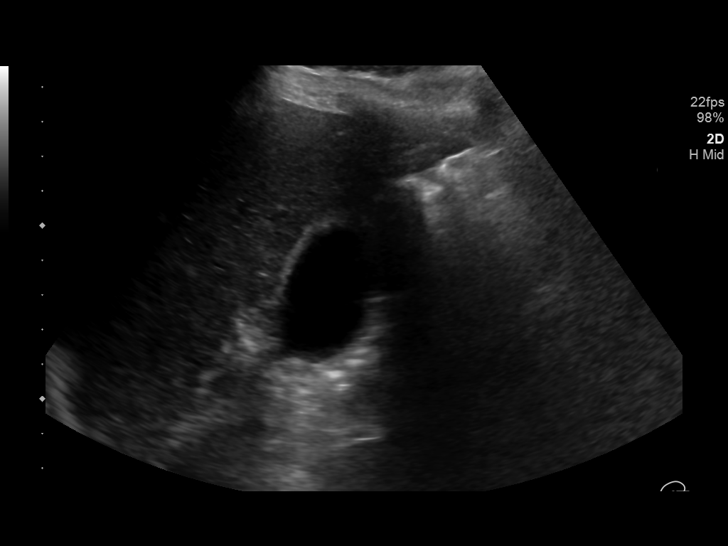
[im 17/44]
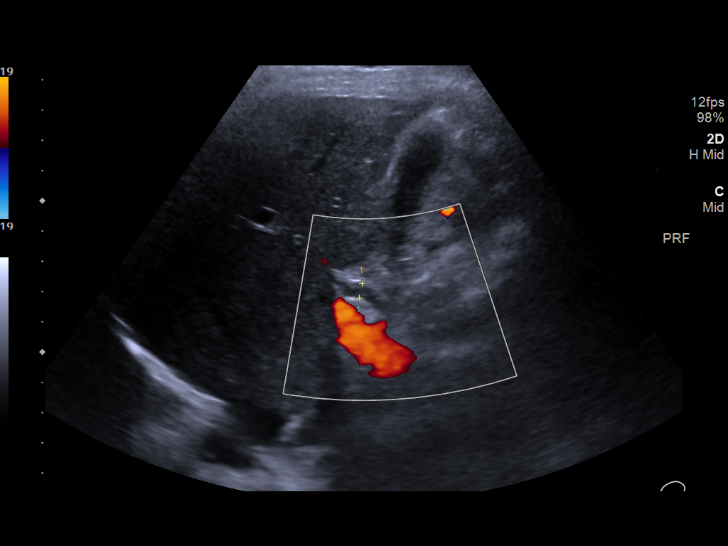
[im 20/44]
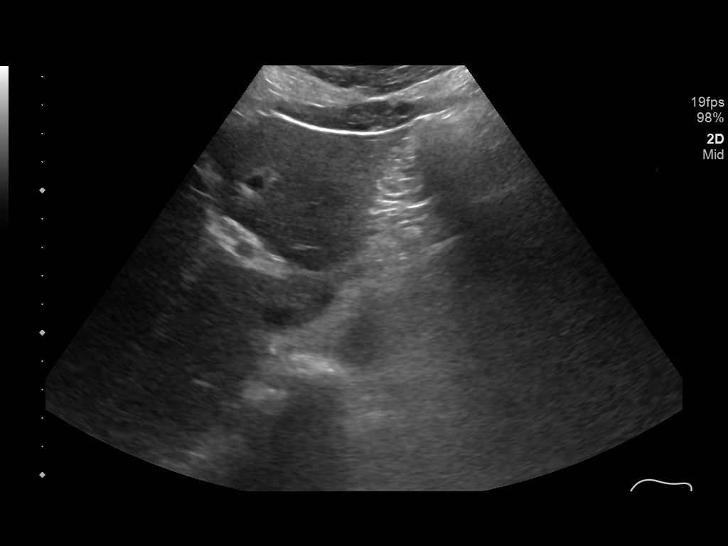
[im 24/44]
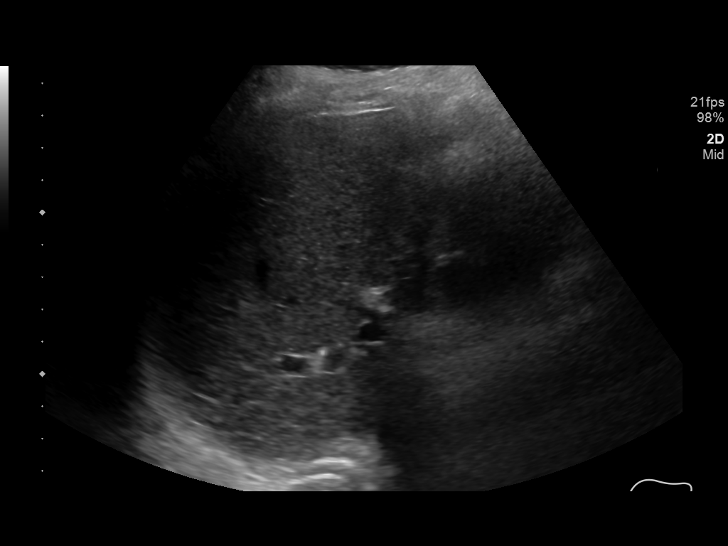
[im 27/44]
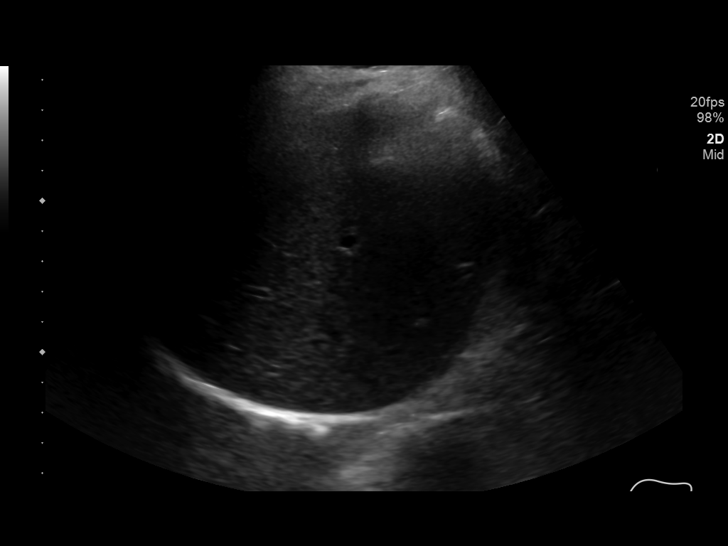
[im 29/44]
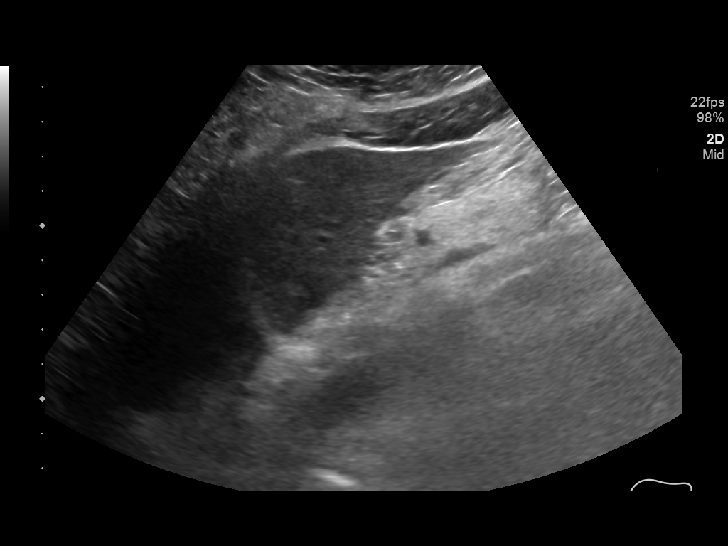
[im 33/44]
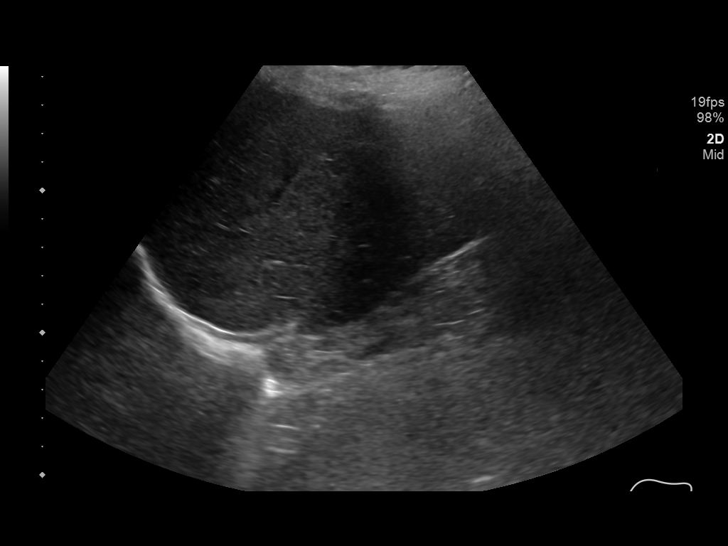
[im 36/44]
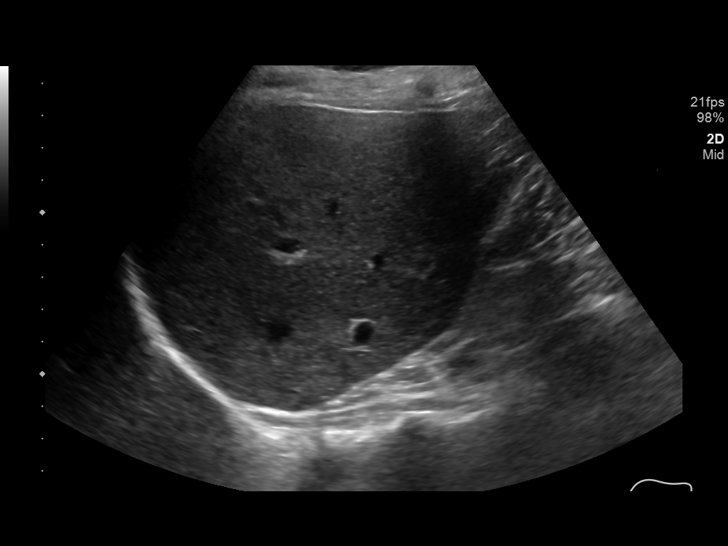
[im 40/44]
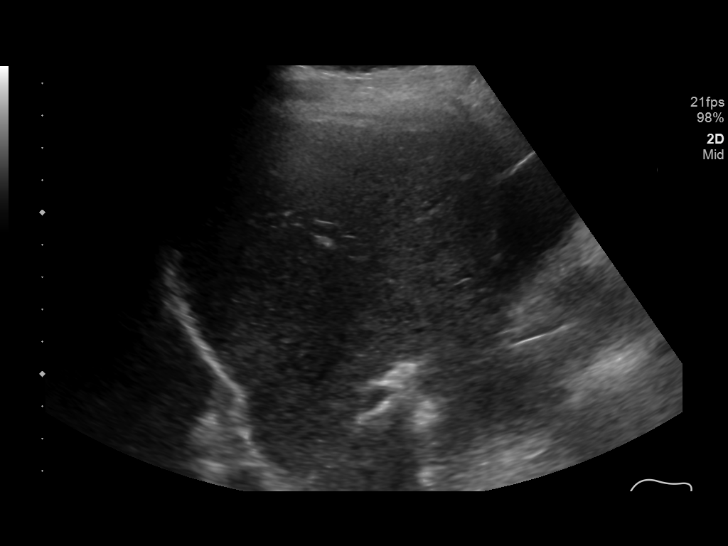
[im 44/44]
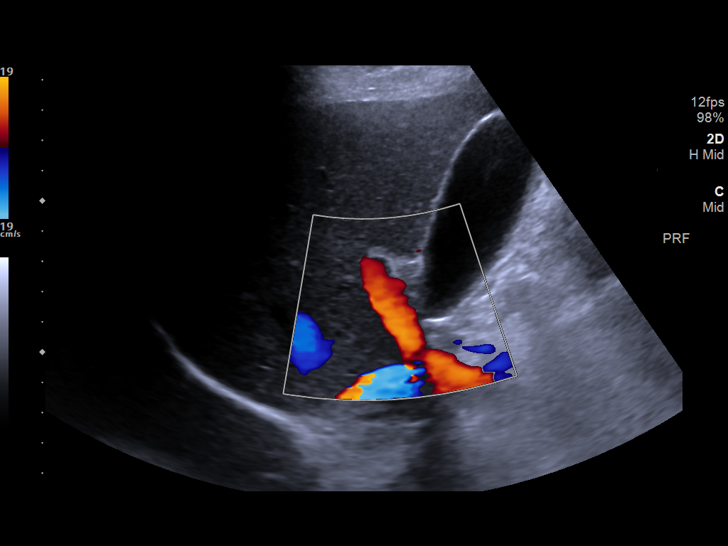

[14 of 25 positions shown; findings below may reference images not displayed]

FINDINGS: Gallbladder:

Gallstones with the largest measuring 1.2 cm. No gallbladder wall
thickening. No sonographic Murphy sign noted by sonographer.

Common bile duct:

Diameter: 5 mm

Liver:

No focal lesion identified. Within normal limits in parenchymal
echogenicity. Portal vein is patent on color Doppler imaging with
normal direction of blood flow towards the liver.
IMPRESSION: Cholelithiasis without evidence of cholecystitis.

## 2021-02-18 DIAGNOSIS — F411 Generalized anxiety disorder: Secondary | ICD-10-CM | POA: Diagnosis not present

## 2021-02-18 DIAGNOSIS — G4700 Insomnia, unspecified: Secondary | ICD-10-CM | POA: Diagnosis not present

## 2021-02-18 DIAGNOSIS — K219 Gastro-esophageal reflux disease without esophagitis: Secondary | ICD-10-CM | POA: Diagnosis not present

## 2021-02-18 DIAGNOSIS — R350 Frequency of micturition: Secondary | ICD-10-CM | POA: Diagnosis not present

## 2021-05-14 DIAGNOSIS — K219 Gastro-esophageal reflux disease without esophagitis: Secondary | ICD-10-CM | POA: Diagnosis not present

## 2021-05-14 DIAGNOSIS — K639 Disease of intestine, unspecified: Secondary | ICD-10-CM | POA: Diagnosis not present

## 2021-05-14 DIAGNOSIS — R1084 Generalized abdominal pain: Secondary | ICD-10-CM | POA: Diagnosis not present

## 2021-05-14 DIAGNOSIS — Z86010 Personal history of colonic polyps: Secondary | ICD-10-CM | POA: Diagnosis not present

## 2021-05-20 DIAGNOSIS — D485 Neoplasm of uncertain behavior of skin: Secondary | ICD-10-CM | POA: Diagnosis not present

## 2021-05-20 DIAGNOSIS — Z08 Encounter for follow-up examination after completed treatment for malignant neoplasm: Secondary | ICD-10-CM | POA: Diagnosis not present

## 2021-05-20 DIAGNOSIS — Z1283 Encounter for screening for malignant neoplasm of skin: Secondary | ICD-10-CM | POA: Diagnosis not present

## 2021-05-20 DIAGNOSIS — Z85820 Personal history of malignant melanoma of skin: Secondary | ICD-10-CM | POA: Diagnosis not present

## 2021-05-20 DIAGNOSIS — D225 Melanocytic nevi of trunk: Secondary | ICD-10-CM | POA: Diagnosis not present

## 2021-05-20 DIAGNOSIS — L821 Other seborrheic keratosis: Secondary | ICD-10-CM | POA: Diagnosis not present

## 2021-05-21 ENCOUNTER — Ambulatory Visit
Admission: RE | Admit: 2021-05-21 | Discharge: 2021-05-21 | Disposition: A | Discharge status: Home / Self Care | Payer: BC Managed Care – PPO | Source: Ambulatory Visit | Attending: Physician Assistant | Admitting: Physician Assistant

## 2021-05-21 ENCOUNTER — Other Ambulatory Visit: Payer: Self-pay | Admitting: Physician Assistant

## 2021-05-21 DIAGNOSIS — K5900 Constipation, unspecified: Secondary | ICD-10-CM

## 2021-05-28 DIAGNOSIS — Z1289 Encounter for screening for malignant neoplasm of other sites: Secondary | ICD-10-CM | POA: Diagnosis not present

## 2021-05-28 DIAGNOSIS — R300 Dysuria: Secondary | ICD-10-CM | POA: Diagnosis not present

## 2021-05-28 DIAGNOSIS — Z0000 Encounter for general adult medical examination without abnormal findings: Secondary | ICD-10-CM | POA: Diagnosis not present

## 2021-05-28 DIAGNOSIS — N951 Menopausal and female climacteric states: Secondary | ICD-10-CM | POA: Diagnosis not present

## 2021-05-28 DIAGNOSIS — E559 Vitamin D deficiency, unspecified: Secondary | ICD-10-CM | POA: Diagnosis not present

## 2021-05-28 DIAGNOSIS — Z6826 Body mass index (BMI) 26.0-26.9, adult: Secondary | ICD-10-CM | POA: Diagnosis not present

## 2021-05-28 DIAGNOSIS — F5101 Primary insomnia: Secondary | ICD-10-CM | POA: Diagnosis not present

## 2021-05-28 DIAGNOSIS — R5382 Chronic fatigue, unspecified: Secondary | ICD-10-CM | POA: Diagnosis not present

## 2021-06-03 DIAGNOSIS — R5382 Chronic fatigue, unspecified: Secondary | ICD-10-CM | POA: Diagnosis not present

## 2021-06-03 DIAGNOSIS — N951 Menopausal and female climacteric states: Secondary | ICD-10-CM | POA: Diagnosis not present

## 2021-06-03 DIAGNOSIS — Z6826 Body mass index (BMI) 26.0-26.9, adult: Secondary | ICD-10-CM | POA: Diagnosis not present

## 2021-06-03 DIAGNOSIS — E559 Vitamin D deficiency, unspecified: Secondary | ICD-10-CM | POA: Diagnosis not present

## 2021-07-16 DIAGNOSIS — Z86010 Personal history of colonic polyps: Secondary | ICD-10-CM | POA: Diagnosis not present

## 2021-07-16 DIAGNOSIS — D122 Benign neoplasm of ascending colon: Secondary | ICD-10-CM | POA: Diagnosis not present

## 2022-02-27 DIAGNOSIS — F411 Generalized anxiety disorder: Secondary | ICD-10-CM | POA: Diagnosis not present

## 2022-02-27 DIAGNOSIS — E7800 Pure hypercholesterolemia, unspecified: Secondary | ICD-10-CM | POA: Diagnosis not present

## 2022-02-27 DIAGNOSIS — M797 Fibromyalgia: Secondary | ICD-10-CM | POA: Diagnosis not present

## 2022-02-27 DIAGNOSIS — K219 Gastro-esophageal reflux disease without esophagitis: Secondary | ICD-10-CM | POA: Diagnosis not present

## 2022-02-27 DIAGNOSIS — G4700 Insomnia, unspecified: Secondary | ICD-10-CM | POA: Diagnosis not present

## 2022-04-05 IMAGING — RF DG ESOPHAGUS
5 series · 14 of 24 positions shown · non-contrast
Comparison: None.

CLINICAL DATA: Pharyngoesophageal phase dysphagia.

EXAM:
ESOPHOGRAM / BARIUM SWALLOW / BARIUM TABLET STUDY
TECHNIQUE: Combined double contrast and single contrast examination performed
using effervescent crystals, thick barium liquid, and thin barium
liquid. The patient was observed with fluoroscopy swallowing a 13 mm
barium sulphate tablet.
FLUOROSCOPY TIME:  Fluoroscopy Time:  1 minutes 12 seconds
Radiation Exposure Index (if provided by the fluoroscopic device):
82 mGy
Number of Acquired Spot Images: 9

[Series 1: sequence · 0.23mm/px · 2 of 4 frames shown (1 of 3)]
[frame 1/4]
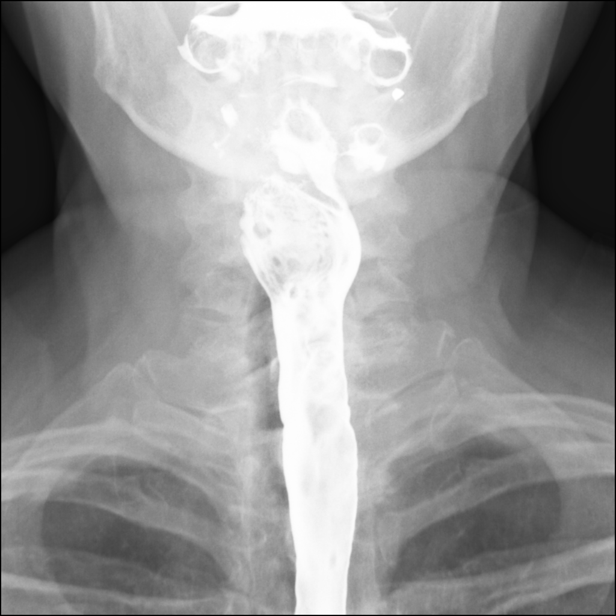
[frame 4/4]
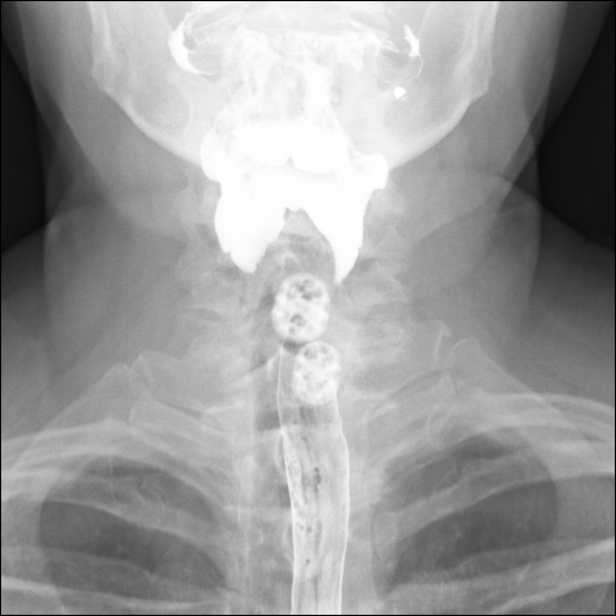

[Series 3: sequence · 0.23mm/px · 1 of 5 frames shown (2 of 3)]
[frame 3/5]
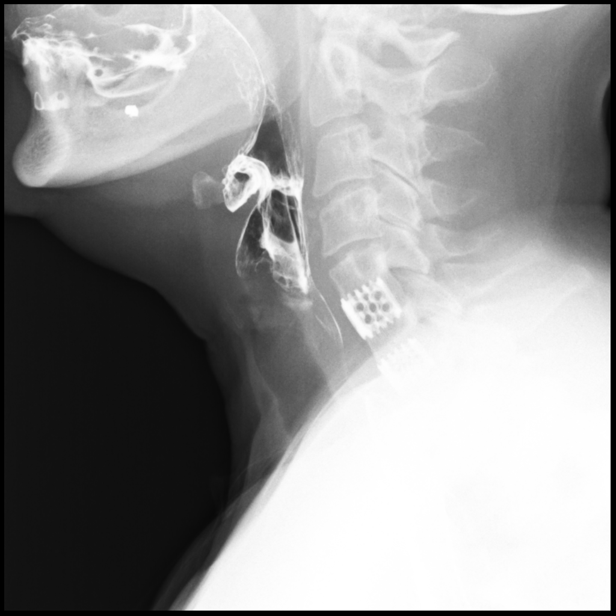

[Series 4: one shot · 1 of 1 slices shown (1 of 2)]
[im 1/1]
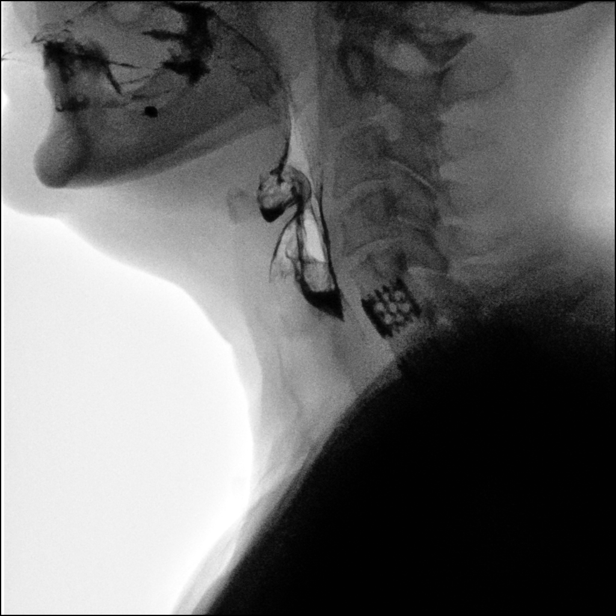

[Series 5: sequence · 1 of 2 frames shown (3 of 3)]
[frame 2/2]
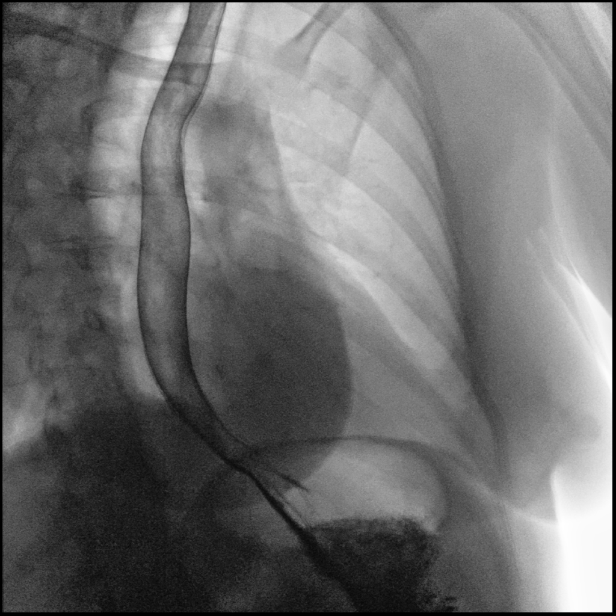

[Series 6: one shot · 9 of 24 slices shown (2 of 2)]
[im 2/24]
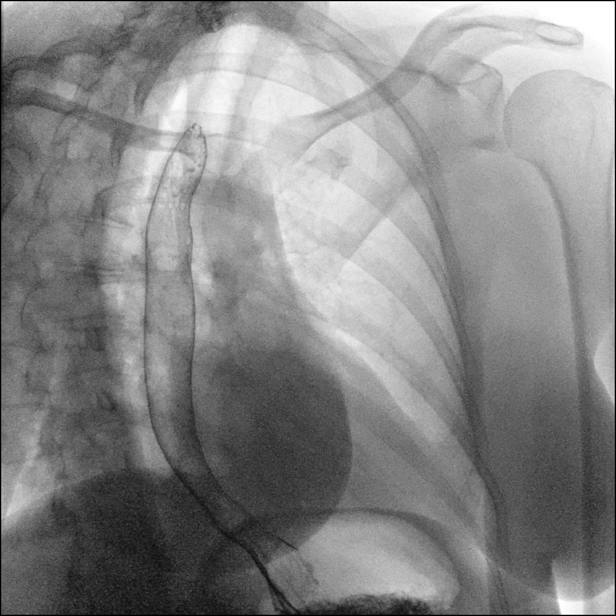
[im 4/24]
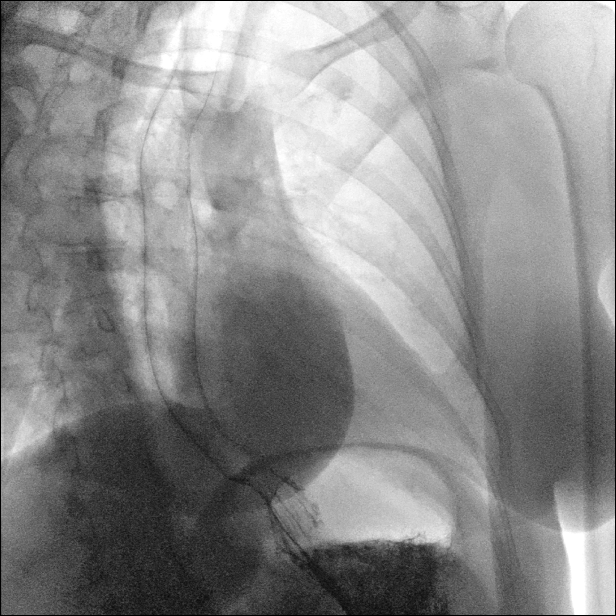
[im 7/24]
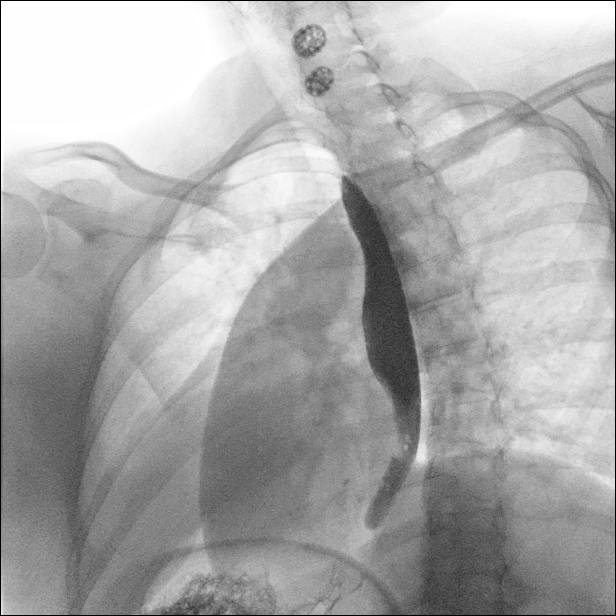
[im 9/24]
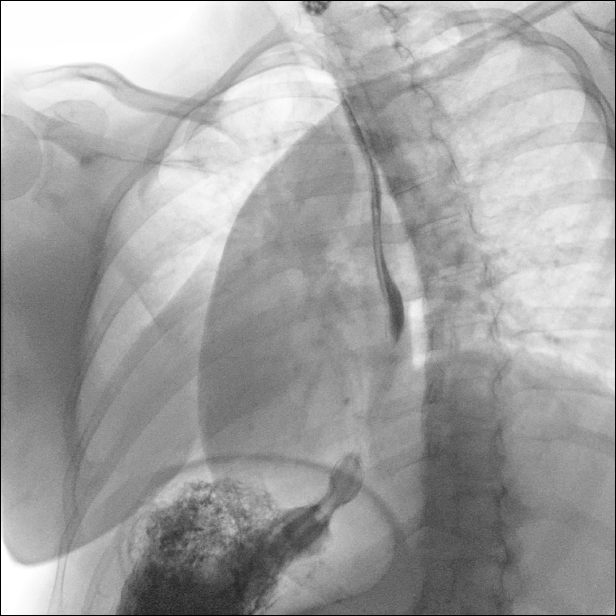
[im 12/24]
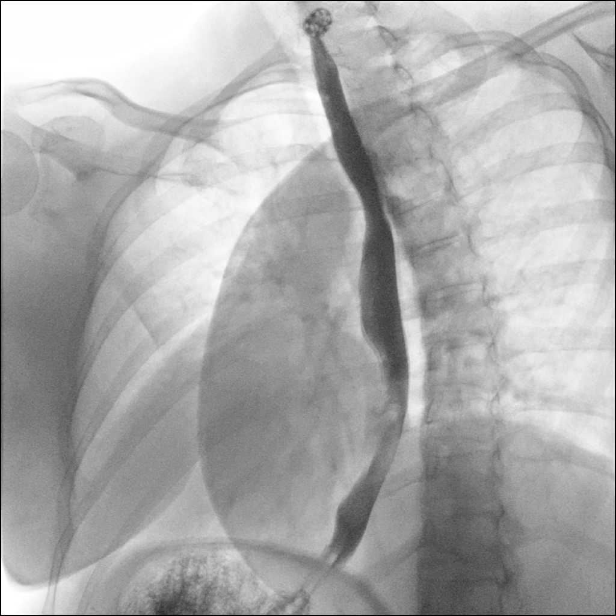
[im 16/24]
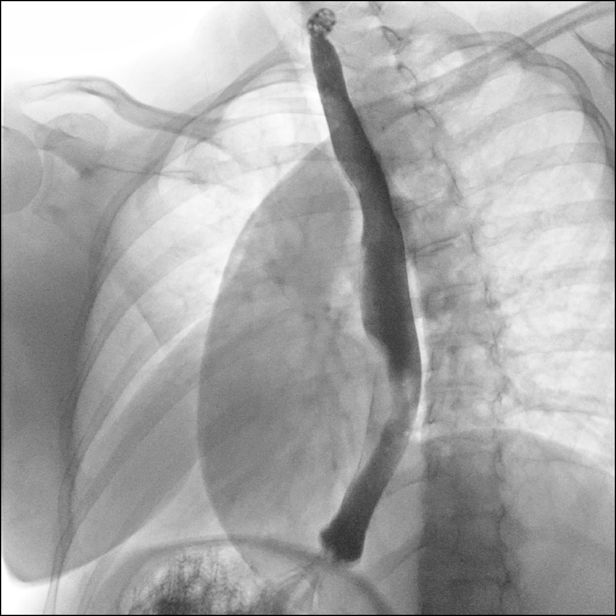
[im 17/24]
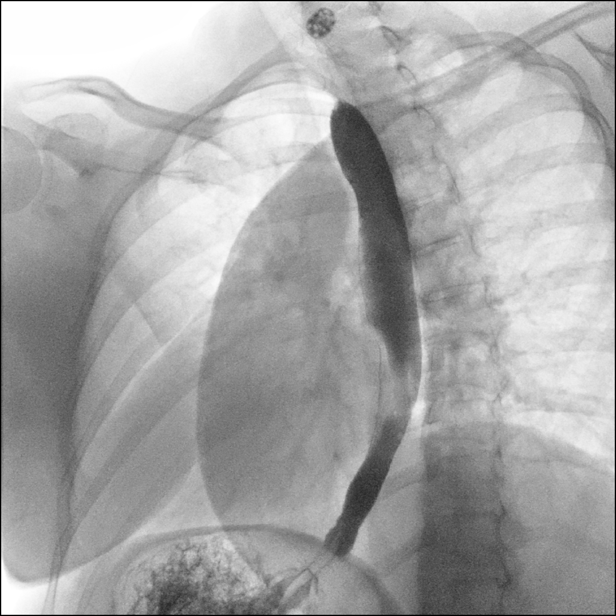
[im 21/24]
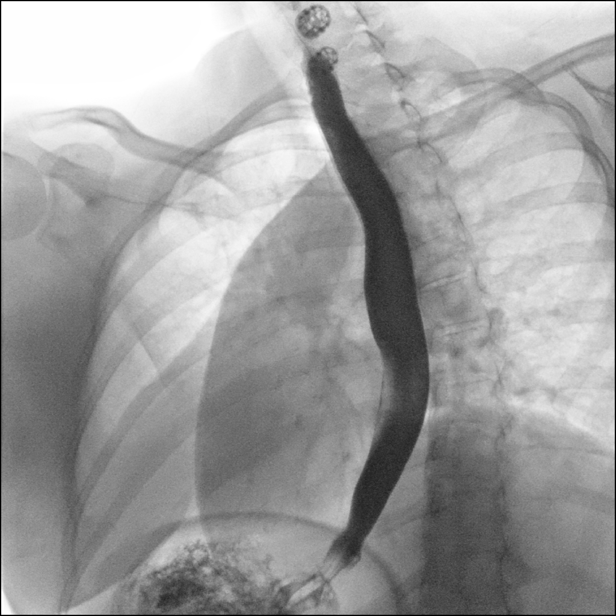
[im 24/24]
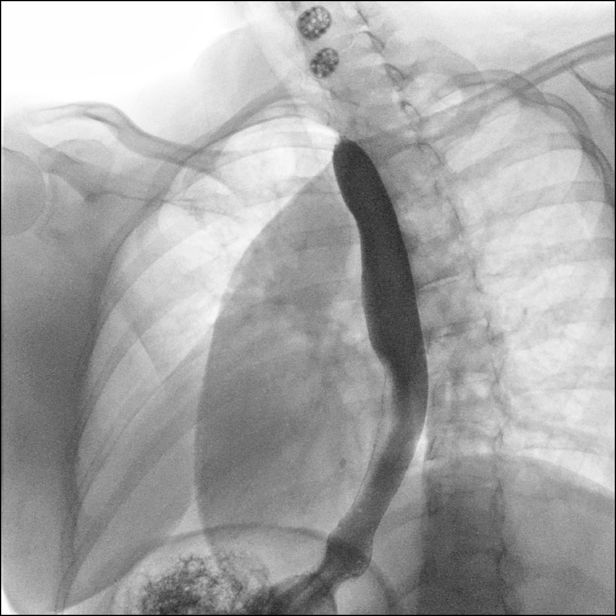

[14 of 24 positions shown; findings below may reference images not displayed]

FINDINGS: Swallowing mechanism is unremarkable. No esophageal fold thickening,
stricture or obstruction. There is some stasis of contrast in the
esophagus during single swallow evaluation as well as reflux. Tiny
hiatal hernia. A 13 mm barium tablet passed into the stomach without
difficulty.
IMPRESSION: 1. Mild esophageal dysmotility.
2. Gastroesophageal reflux.
3. Tiny hiatal hernia.

## 2022-09-30 DIAGNOSIS — F411 Generalized anxiety disorder: Secondary | ICD-10-CM | POA: Diagnosis not present

## 2022-09-30 DIAGNOSIS — G4700 Insomnia, unspecified: Secondary | ICD-10-CM | POA: Diagnosis not present

## 2022-09-30 DIAGNOSIS — E7800 Pure hypercholesterolemia, unspecified: Secondary | ICD-10-CM | POA: Diagnosis not present

## 2022-09-30 DIAGNOSIS — M797 Fibromyalgia: Secondary | ICD-10-CM | POA: Diagnosis not present

## 2022-09-30 DIAGNOSIS — K219 Gastro-esophageal reflux disease without esophagitis: Secondary | ICD-10-CM | POA: Diagnosis not present

## 2022-10-26 DIAGNOSIS — M542 Cervicalgia: Secondary | ICD-10-CM | POA: Diagnosis not present

## 2022-10-26 DIAGNOSIS — R519 Headache, unspecified: Secondary | ICD-10-CM | POA: Diagnosis not present

## 2022-10-26 DIAGNOSIS — Z6825 Body mass index (BMI) 25.0-25.9, adult: Secondary | ICD-10-CM | POA: Diagnosis not present

## 2022-10-27 ENCOUNTER — Other Ambulatory Visit: Payer: Self-pay | Admitting: Pain Medicine

## 2022-10-27 DIAGNOSIS — R519 Headache, unspecified: Secondary | ICD-10-CM

## 2022-10-29 ENCOUNTER — Encounter: Payer: Self-pay | Admitting: Pain Medicine

## 2022-11-11 ENCOUNTER — Ambulatory Visit
Admission: RE | Admit: 2022-11-11 | Discharge: 2022-11-11 | Disposition: A | Discharge status: Home / Self Care | Payer: BC Managed Care – PPO | Source: Ambulatory Visit | Attending: Pain Medicine | Admitting: Pain Medicine

## 2022-11-11 DIAGNOSIS — R519 Headache, unspecified: Secondary | ICD-10-CM | POA: Diagnosis not present

## 2022-11-11 DIAGNOSIS — R29810 Facial weakness: Secondary | ICD-10-CM | POA: Diagnosis not present

## 2022-11-16 ENCOUNTER — Emergency Department (HOSPITAL_BASED_OUTPATIENT_CLINIC_OR_DEPARTMENT_OTHER): Payer: BC Managed Care – PPO

## 2022-11-16 ENCOUNTER — Encounter (HOSPITAL_BASED_OUTPATIENT_CLINIC_OR_DEPARTMENT_OTHER): Payer: Self-pay | Admitting: Emergency Medicine

## 2022-11-16 ENCOUNTER — Other Ambulatory Visit: Payer: Self-pay

## 2022-11-16 ENCOUNTER — Emergency Department (HOSPITAL_BASED_OUTPATIENT_CLINIC_OR_DEPARTMENT_OTHER)
Admission: EM | Admit: 2022-11-16 | Discharge: 2022-11-16 | Disposition: A | Discharge status: Home / Self Care | Payer: BC Managed Care – PPO | Attending: Emergency Medicine | Admitting: Emergency Medicine

## 2022-11-16 ENCOUNTER — Other Ambulatory Visit (HOSPITAL_BASED_OUTPATIENT_CLINIC_OR_DEPARTMENT_OTHER): Payer: Self-pay

## 2022-11-16 DIAGNOSIS — R079 Chest pain, unspecified: Secondary | ICD-10-CM | POA: Diagnosis not present

## 2022-11-16 DIAGNOSIS — M5441 Lumbago with sciatica, right side: Secondary | ICD-10-CM | POA: Diagnosis not present

## 2022-11-16 DIAGNOSIS — R932 Abnormal findings on diagnostic imaging of liver and biliary tract: Secondary | ICD-10-CM | POA: Diagnosis not present

## 2022-11-16 DIAGNOSIS — R0789 Other chest pain: Secondary | ICD-10-CM | POA: Diagnosis not present

## 2022-11-16 DIAGNOSIS — M545 Low back pain, unspecified: Secondary | ICD-10-CM | POA: Diagnosis not present

## 2022-11-16 DIAGNOSIS — R911 Solitary pulmonary nodule: Secondary | ICD-10-CM | POA: Diagnosis not present

## 2022-11-16 DIAGNOSIS — M5442 Lumbago with sciatica, left side: Secondary | ICD-10-CM | POA: Insufficient documentation

## 2022-11-16 DIAGNOSIS — K769 Liver disease, unspecified: Secondary | ICD-10-CM | POA: Diagnosis not present

## 2022-11-16 LAB — CBC WITH DIFFERENTIAL/PLATELET
Abs Immature Granulocytes: 0.02 10*3/uL (ref 0.00–0.07)
Basophils Absolute: 0 10*3/uL (ref 0.0–0.1)
Basophils Relative: 0 %
Eosinophils Absolute: 0.1 10*3/uL (ref 0.0–0.5)
Eosinophils Relative: 1 %
HCT: 37.4 % (ref 36.0–46.0)
Hemoglobin: 13.1 g/dL (ref 12.0–15.0)
Immature Granulocytes: 0 %
Lymphocytes Relative: 15 %
Lymphs Abs: 1.2 10*3/uL (ref 0.7–4.0)
MCH: 32.8 pg (ref 26.0–34.0)
MCHC: 35 g/dL (ref 30.0–36.0)
MCV: 93.7 fL (ref 80.0–100.0)
Monocytes Absolute: 0.5 10*3/uL (ref 0.1–1.0)
Monocytes Relative: 6 %
Neutro Abs: 5.7 10*3/uL (ref 1.7–7.7)
Neutrophils Relative %: 78 %
Platelets: 296 10*3/uL (ref 150–400)
RBC: 3.99 MIL/uL (ref 3.87–5.11)
RDW: 13 % (ref 11.5–15.5)
WBC: 7.5 10*3/uL (ref 4.0–10.5)
nRBC: 0 % (ref 0.0–0.2)

## 2022-11-16 LAB — COMPREHENSIVE METABOLIC PANEL
ALT: 23 U/L (ref 0–44)
AST: 33 U/L (ref 15–41)
Albumin: 4.5 g/dL (ref 3.5–5.0)
Alkaline Phosphatase: 99 U/L (ref 38–126)
Anion gap: 4 — ABNORMAL LOW (ref 5–15)
BUN: 7 mg/dL (ref 6–20)
CO2: 31 mmol/L (ref 22–32)
Calcium: 10.2 mg/dL (ref 8.9–10.3)
Chloride: 105 mmol/L (ref 98–111)
Creatinine, Ser: 0.92 mg/dL (ref 0.44–1.00)
GFR, Estimated: >60 mL/min (ref >60)
Glucose, Bld: 120 mg/dL — ABNORMAL HIGH (ref 70–99)
Potassium: 4 mmol/L (ref 3.5–5.1)
Sodium: 140 mmol/L (ref 135–145)
Total Bilirubin: 0.5 mg/dL (ref 0.3–1.2)
Total Protein: 7.3 g/dL (ref 6.5–8.1)

## 2022-11-16 LAB — LIPASE, BLOOD: Lipase: 30 U/L (ref 11–51)

## 2022-11-16 LAB — TROPONIN I (HIGH SENSITIVITY): Troponin I (High Sensitivity): <2 ng/L (ref <18)

## 2022-11-16 MED ORDER — IOHEXOL 350 MG/ML SOLN
100.0000 mL | Freq: Once | INTRAVENOUS | Status: AC | PRN
  Administered 2022-11-16: 100 mL via INTRAVENOUS

## 2022-11-16 MED ORDER — MORPHINE SULFATE (PF) 4 MG/ML IV SOLN
4.0000 mg | Freq: Once | INTRAVENOUS | Status: AC
  Administered 2022-11-16: 4 mg via INTRAVENOUS
  Filled 2022-11-16: qty 1

## 2022-11-16 MED ORDER — DIAZEPAM 5 MG PO TABS
5.0000 mg | ORAL_TABLET | Freq: Once | ORAL | Status: AC
  Administered 2022-11-16: 5 mg via ORAL
  Filled 2022-11-16: qty 1

## 2022-11-16 MED ORDER — ONDANSETRON HCL 4 MG/2ML IJ SOLN
4.0000 mg | Freq: Once | INTRAMUSCULAR | Status: AC
  Administered 2022-11-16: 4 mg via INTRAVENOUS
  Filled 2022-11-16: qty 2

## 2022-11-16 MED ORDER — ACETAMINOPHEN 500 MG PO TABS
1000.0000 mg | ORAL_TABLET | Freq: Once | ORAL | Status: AC
  Administered 2022-11-16: 1000 mg via ORAL
  Filled 2022-11-16: qty 2

## 2022-11-16 MED ORDER — OXYCODONE HCL 5 MG PO TABS
5.0000 mg | ORAL_TABLET | Freq: Four times a day (QID) | ORAL | 0 refills | Status: DC | PRN
  Filled 2022-11-16: qty 15, 4d supply, fill #0

## 2022-11-16 NOTE — ED Notes (Signed)
X-ray at bedside

## 2022-11-16 NOTE — ED Provider Notes (Signed)
[place] EMERGENCY DEPARTMENT AT [facility] Provider Note   CSN: [id number] Arrival date & time: 11/16/22  1013     History  Chief Complaint  Patient presents with   Back Pain   Chest Pain    Laura [name] is a 57 y.o. female.  57 yo F with a chief complaints of back pain that radiates down both legs chest pain that radiates down the left arm.  The symptoms have been going on for some time.  She tells me that the back pain has been more recently and been going on for about a week.  No obvious trauma no loss of bowel or bladder no loss of rectal sensation.  She feels like maybe she can feel things little bit better on the right side than the left.  Chest pain has been going on for longer.  Worse with ambulation and twisting turning palpation.   Back Pain Associated symptoms: chest pain   Chest Pain Associated symptoms: back pain        Home Medications Prior to Admission medications   Medication Sig Start Date End Date Taking? Authorizing Provider  HYDROcodone-acetaminophen (NORCO) 5-325 MG tablet Take 1-2 tablets by mouth every 6 (six) hours as needed for moderate pain or severe pain. 04/20/18   Claud Kelp, MD  omeprazole (PRILOSEC) 40 MG capsule Take 40 mg by mouth daily.    [provider]  promethazine (PHENERGAN) 25 MG tablet Take 25 mg by mouth every 6 (six) hours as needed for nausea or vomiting.    [provider]      Allergies    Aspirin    Review of Systems   Review of Systems  Cardiovascular:  Positive for chest pain.  Musculoskeletal:  Positive for back pain.    Physical Exam Updated Vital Signs BP 137/81 (BP Location: Right Arm)   Pulse (!) 107   Temp 98.1 F (36.7 C) (Oral)   Resp 17   Wt 63.5 kg   SpO2 100%   BMI 24.80 kg/m  Physical Exam Vitals and nursing note reviewed.  Constitutional:      General: She is not in acute distress.    Appearance: She is well-developed. She is not diaphoretic.  HENT:      Head: Normocephalic and atraumatic.  Eyes:     Pupils: Pupils are equal, round, and reactive to light.  Cardiovascular:     Rate and Rhythm: Normal rate and regular rhythm.     Heart sounds: No murmur heard.    No friction rub. No gallop.  Pulmonary:     Effort: Pulmonary effort is normal.     Breath sounds: No wheezing or rales.  Abdominal:     General: There is no distension.     Palpations: Abdomen is soft.     Tenderness: There is no abdominal tenderness.  Musculoskeletal:        General: No tenderness.     Cervical back: Normal range of motion and neck supple.     Comments: Patient with diffuse pain on palpation about the low back and along the left anterior chest wall.  No obvious midline spinal tenderness step-offs deformities.  Pulse motor and sensation intact of bilateral lower extremities.  Reflexes 2+ and equal.  No clonus.  Skin:    General: Skin is warm and dry.  Neurological:     Mental Status: She is alert and oriented to person, place, and time.  Psychiatric:  Behavior: Behavior normal.     ED Results / Procedures / Treatments   Labs (all labs ordered are listed, but only abnormal results are displayed) Labs Reviewed  CBC WITH DIFFERENTIAL/PLATELET  COMPREHENSIVE METABOLIC PANEL  LIPASE, BLOOD  TROPONIN I (HIGH SENSITIVITY)    EKG None  Radiology No results found.  Procedures Procedures    Medications Ordered in ED Medications  morphine (PF) 4 MG/ML injection 4 mg (has no administration in time range)  ondansetron (ZOFRAN) injection 4 mg (has no administration in time range)  diazepam (VALIUM) tablet 5 mg (has no administration in time range)    ED Course/ Medical Decision Making/ A&P                                 Medical Decision Making Amount and/or Complexity of Data Reviewed Labs: ordered. Radiology: ordered.  Risk Prescription drug management.   57 yo F with a chief complaints of low back pain that radiates down both legs  and chest pain that radiates to the back and radiates down the left arm.  I think most likely this is musculoskeletal by history and physical.  She is quite tachycardic on arrival.  Symptoms going on for about a week or maybe longer.  I think less likely to be an acute aortic dissection but with her feeling so uncomfortable and having some significant tachycardia on arrival will obtain CT imaging of chest abdomen pelvis.  Blood work.  Reassess.  Troponin negative, no anemia, no significant electrolyte abnormalities.  Renal function at baseline.  LFTs and lipase are unremarkable.  Chest x-ray independently interpreted by me without focal infiltrate or pneumothorax.  Awaiting CT read.  Patient care was signed out to Dr. Andria Meuse.  Please see their note for further details care in the ED.  The patients results and plan were reviewed and discussed.   Any x-rays performed were independently reviewed by myself.   Differential diagnosis were considered with the presenting HPI.  Medications  morphine (PF) 4 MG/ML injection 4 mg (4 mg Intravenous Given 11/16/22 1132)  ondansetron (ZOFRAN) injection 4 mg (4 mg Intravenous Given 11/16/22 1131)  diazepam (VALIUM) tablet 5 mg (5 mg Oral Given 11/16/22 1134)  iohexol (OMNIPAQUE) 350 MG/ML injection 100 mL (100 mLs Intravenous Contrast Given 11/16/22 1314)    Vitals:   11/16/22 1031 11/16/22 1100 11/16/22 1200 11/16/22 1430  BP:  115/76 (!) 154/89 120/73  Pulse:  (!) 102 81 75  Resp:  12 13 15   Temp:      TempSrc:      SpO2:  93% 96% 97%  Weight: 63.5 kg       Final diagnoses:  Acute bilateral low back pain with bilateral sciatica  Nonspecific chest pain    Admission/ observation were discussed with the admitting physician, patient and/or family and they are comfortable with the plan.          Final Clinical Impression(s) / ED Diagnoses Final diagnoses:  None    Rx / DC Orders ED Discharge Orders     None         Melene Plan,  DO 11/16/22 1509

## 2022-11-16 NOTE — Discharge Instructions (Addendum)
While you are in the emergency department, you had labs done that were normal.  You had CT imaging done which showed concern for possible cancer.  The areas that they are concerned about are your liver, and an area in your lower back where you are having pain.  At this time, we do not know for sure that this is cancer but it will need additional testing.  This can be arranged by your primary care doctor.  You should call them tomorrow to set up an appointment for the next couple of days to arrange for this testing.  Some of the tests that they may consider would be an MRI of your lower back, and a PET scan.   You can take 5 mg of oxycodone every 6 hours as needed for severe pain, you can take 1000 mg of Tylenol every 8 hours.  You can take 400 mg of ibuprofen every 6 hours.

## 2022-11-16 NOTE — ED Notes (Signed)
Pt discharged to home using teachback Method. Discharge instructions have been discussed with patient and/or family members. Pt verbally acknowledges understanding d/c instructions, has been given opportunity for questions to be answered, and endorses comprehension to checkout at registration before leaving.  

## 2022-11-16 NOTE — ED Provider Notes (Signed)
  Physical Exam  BP 119/75   Pulse 74   Temp 97.8 F (36.6 C) (Oral)   Resp 14   Wt 63.5 kg   SpO2 99%   BMI 24.80 kg/m   Physical Exam  Procedures  Procedures  ED Course / MDM    Medical Decision Making Amount and/or Complexity of Data Reviewed Labs: ordered. Radiology: ordered.  Risk OTC drugs. Prescription drug management.   Wardell Honour, assumed care from this patient from Dr. Adela Lank.  This is a 57 year old female who came in today with back pain, arm pain chest pain.  She was a signout pending CT imaging.  Unfortunately, patient CT imaging shows possible metastatic disease, with mets in the liver, L5.  I shared this news with this patient, counseled her on how the next steps would be outpatient further diagnostic imaging.  Patient has a family history of breast cancer, she says her last mammogram was sometime ago.  She has not noticed any lumps.  She has not had any vaginal discharge.  She says that a few months ago, she was told to decrease Tylenol use because she had some elevations in her LFTs.  Patient does not have any neurological deficits on my exam.  She has no numbness, tingling, has a normal gait.  She will require outpatient MRI.  Will send patient with prescription for narcotics.      Anders Simmonds T, DO 11/16/22 1706

## 2022-11-16 NOTE — ED Triage Notes (Signed)
Pt c/o lower back pain radiating to BLE x 6 days, with CP and LT arm pain starting yesterday

## 2022-11-17 DIAGNOSIS — M899 Disorder of bone, unspecified: Secondary | ICD-10-CM | POA: Diagnosis not present

## 2022-11-17 DIAGNOSIS — R911 Solitary pulmonary nodule: Secondary | ICD-10-CM | POA: Diagnosis not present

## 2022-11-17 DIAGNOSIS — M8468XD Pathological fracture in other disease, other site, subsequent encounter for fracture with routine healing: Secondary | ICD-10-CM | POA: Diagnosis not present

## 2022-11-17 DIAGNOSIS — K769 Liver disease, unspecified: Secondary | ICD-10-CM | POA: Diagnosis not present

## 2022-11-18 ENCOUNTER — Inpatient Hospital Stay: Payer: BC Managed Care – PPO | Attending: Physician Assistant | Admitting: Physician Assistant

## 2022-11-18 ENCOUNTER — Inpatient Hospital Stay: Payer: BC Managed Care – PPO | Admitting: Physician Assistant

## 2022-11-18 ENCOUNTER — Encounter: Payer: Self-pay | Admitting: Physician Assistant

## 2022-11-18 ENCOUNTER — Telehealth: Payer: Self-pay

## 2022-11-18 VITALS — BP 118/87 | HR 105 | Temp 98.3°F | Resp 18 | Ht 63.0 in | Wt 138.9 lb

## 2022-11-18 DIAGNOSIS — M797 Fibromyalgia: Secondary | ICD-10-CM | POA: Insufficient documentation

## 2022-11-18 DIAGNOSIS — R079 Chest pain, unspecified: Secondary | ICD-10-CM | POA: Insufficient documentation

## 2022-11-18 DIAGNOSIS — M899 Disorder of bone, unspecified: Secondary | ICD-10-CM

## 2022-11-18 DIAGNOSIS — Z9889 Other specified postprocedural states: Secondary | ICD-10-CM | POA: Insufficient documentation

## 2022-11-18 DIAGNOSIS — R911 Solitary pulmonary nodule: Secondary | ICD-10-CM

## 2022-11-18 DIAGNOSIS — C787 Secondary malignant neoplasm of liver and intrahepatic bile duct: Secondary | ICD-10-CM | POA: Insufficient documentation

## 2022-11-18 DIAGNOSIS — K769 Liver disease, unspecified: Secondary | ICD-10-CM

## 2022-11-18 DIAGNOSIS — K219 Gastro-esophageal reflux disease without esophagitis: Secondary | ICD-10-CM | POA: Diagnosis not present

## 2022-11-18 DIAGNOSIS — Z85820 Personal history of malignant melanoma of skin: Secondary | ICD-10-CM | POA: Diagnosis not present

## 2022-11-18 DIAGNOSIS — E7800 Pure hypercholesterolemia, unspecified: Secondary | ICD-10-CM | POA: Diagnosis not present

## 2022-11-18 DIAGNOSIS — R748 Abnormal levels of other serum enzymes: Secondary | ICD-10-CM

## 2022-11-18 DIAGNOSIS — G893 Neoplasm related pain (acute) (chronic): Secondary | ICD-10-CM | POA: Diagnosis not present

## 2022-11-18 DIAGNOSIS — G629 Polyneuropathy, unspecified: Secondary | ICD-10-CM | POA: Insufficient documentation

## 2022-11-18 DIAGNOSIS — M545 Low back pain, unspecified: Secondary | ICD-10-CM | POA: Insufficient documentation

## 2022-11-18 DIAGNOSIS — C7951 Secondary malignant neoplasm of bone: Secondary | ICD-10-CM | POA: Insufficient documentation

## 2022-11-18 LAB — CBC WITH DIFFERENTIAL (CANCER CENTER ONLY)
Abs Immature Granulocytes: 0.02 10*3/uL (ref 0.00–0.07)
Basophils Absolute: 0 10*3/uL (ref 0.0–0.1)
Basophils Relative: 1 %
Eosinophils Absolute: 0.2 10*3/uL (ref 0.0–0.5)
Eosinophils Relative: 2 %
HCT: 40.2 % (ref 36.0–46.0)
Hemoglobin: 13.8 g/dL (ref 12.0–15.0)
Immature Granulocytes: 0 %
Lymphocytes Relative: 17 %
Lymphs Abs: 1.1 10*3/uL (ref 0.7–4.0)
MCH: 32.5 pg (ref 26.0–34.0)
MCHC: 34.3 g/dL (ref 30.0–36.0)
MCV: 94.6 fL (ref 80.0–100.0)
Monocytes Absolute: 0.4 10*3/uL (ref 0.1–1.0)
Monocytes Relative: 6 %
Neutro Abs: 5.1 10*3/uL (ref 1.7–7.7)
Neutrophils Relative %: 74 %
Platelet Count: 325 10*3/uL (ref 150–400)
RBC: 4.25 MIL/uL (ref 3.87–5.11)
RDW: 12.8 % (ref 11.5–15.5)
WBC Count: 6.8 10*3/uL (ref 4.0–10.5)
nRBC: 0 % (ref 0.0–0.2)

## 2022-11-18 LAB — CMP (CANCER CENTER ONLY)
ALT: 206 U/L — ABNORMAL HIGH (ref 0–44)
AST: 156 U/L — ABNORMAL HIGH (ref 15–41)
Albumin: 4.7 g/dL (ref 3.5–5.0)
Alkaline Phosphatase: 201 U/L — ABNORMAL HIGH (ref 38–126)
Anion gap: 6 (ref 5–15)
BUN: 8 mg/dL (ref 6–20)
CO2: 33 mmol/L — ABNORMAL HIGH (ref 22–32)
Calcium: 10.4 mg/dL — ABNORMAL HIGH (ref 8.9–10.3)
Chloride: 100 mmol/L (ref 98–111)
Creatinine: 0.94 mg/dL (ref 0.44–1.00)
GFR, Estimated: >60 mL/min (ref >60)
Glucose, Bld: 95 mg/dL (ref 70–99)
Potassium: 3.6 mmol/L (ref 3.5–5.1)
Sodium: 139 mmol/L (ref 135–145)
Total Bilirubin: 0.6 mg/dL (ref 0.3–1.2)
Total Protein: 8 g/dL (ref 6.5–8.1)

## 2022-11-18 MED ORDER — OXYCODONE HCL ER 10 MG PO T12A: 10.0000 mg | EXTENDED_RELEASE_TABLET | Freq: Two times a day (BID) | ORAL | 0 refills | Status: DC

## 2022-11-18 NOTE — Telephone Encounter (Signed)
T/C to pt to advise of elevated liver enzymes.  Pt advised to avoid tylenol, alcohol and any other supplements.  She will repeat her labs on 10/30 at [facility] the day of her biopsy.  Pt in agreement with this plan of care.

## 2022-11-18 NOTE — Progress Notes (Signed)
[facility] [phone]   [phone]  INITIAL CONSULTATION:  Patient Care Team: [name], [name] as PCP - General (Family Medicine)  CHIEF COMPLAINTS/PURPOSE OF CONSULTATION:  Abnormal CT scan concerning for metastatic disease  HISTORY OF PRESENTING ILLNESS:  Laura [name] 57 y.o. female with medical history significant for GERD, melanoma of the back s/p excision, hypercholesteremia and fibromyalgia presents to the diagnostic clinic for evaluation of abnormal CT scan from 11/16/2022 concerning for malignancy. She is accompanied by her husband for this visit.   On review of the previous records, Laura [name] presented to the emergency room on 11/16/2022 for back pain x 2 weeks and central chest pain for several months. She underwent CT angiogram of the chest, abdomen and pelvis which showed multiple liver lesions, multiple lytic lesions with pathologic fracture of L5 endplate and 1.2 cm LUL nodule.   On exam today, Laura [name] reports having ongoing fatigue for the last year and requires frequent resting. She is able to complete her ADLs on her own. She reports stable appetite without weight loss. She does have occasional nausea without vomiting episodes. She reports low back pain for the past two weeks which radiationes to her legs (right greater than left). She denies any injuries or trauma to cause the low back pain. In addition, she reports central chest pain that has been present for the last several months. She reports chronic diarrhea versus constipation. She reports neuropathy in her left hand with history of carpal tunnel. She underwent carpal tunnel release several years ago. She denies fevers, chills, sweats, shortness of breath, chest pain or cough. She has no other complaints. Rest of the ROS is below.   MEDICAL HISTORY:  Past Medical History:  Diagnosis Date   Anxiety    Arthritis    Complication of anesthesia     Fibromyalgia    Gallstones 04/20/2018   GERD (gastroesophageal reflux disease)    History of blood transfusion    post Hysterectomy   Hypercholesteremia    Melanoma (HCC)    on back and excised   Migraine    PONV (postoperative nausea and vomiting)    Tobacco abuse    Vertebral artery disease (HCC)    history of left vertebral artery dissection 2011    SURGICAL HISTORY: Past Surgical History:  Procedure Laterality Date   ABDOMINAL HYSTERECTOMY     ANTERIOR FUSION CERVICAL SPINE  2008   BONE GRAFT HIP ILIAC CREST Left    Left Hip to Left Wrist   CHOLECYSTECTOMY N/A 04/20/2018   Procedure: LAPAROSCOPIC CHOLECYSTECTOMY;  Surgeon: Claud Kelp, MD;  Location: [facility] OR;  Service: General;  Laterality: N/A;   thumb surgery Left 2018   [facility]- Tendons Carpal tunnel   WRIST SURGERY  1994    SOCIAL HISTORY: Social History   Socioeconomic History   Marital status: Married    Spouse name: Not on file   Number of children: Not on file   Years of education: Not on file   Highest education level: Not on file  Occupational History   Not on file  Tobacco Use   Smoking status: Former    Current packs/day: 0.00    Average packs/day: 1 pack/day for 20.0 years (20.0 ttl pk-yrs)    Types: Cigarettes    Start date: 74    Quit date: 2018    Years since quitting: 6.8   Smokeless tobacco: Never  Vaping Use   Vaping status: Every Day  Substances: Nicotine  Substance and Sexual Activity   Alcohol use: No    Alcohol/week: 0.0 standard drinks of alcohol   Drug use: No   Sexual activity: Not on file  Other Topics Concern   Not on file  Social History Narrative   Married,  3 kids.   Lives home with husband,  Works at [employer].  Education 12th grade.  Caffeine pepsi daily and tea daily.   '   Social Determinants of Health   Financial Resource Strain: Not on file  Food Insecurity: Not on file  Transportation Needs: Not on file  Physical Activity: Not on file  Stress: Not on file   Social Connections: Not on file  Intimate Partner Violence: Not on file    FAMILY HISTORY: Family History  Problem Relation Age of Onset   Lung cancer Father        smoking hx   Breast cancer Sister        diagnosed over the age of 39   Breast cancer Sister        diagnosed over the age of 54    ALLERGIES:  is allergic to aspirin.  MEDICATIONS:  Current Outpatient Medications  Medication Sig Dispense Refill   AMBIEN CR 6.25 MG CR tablet 1 tablet at bedtime as needed Orally Once a day for 90 days     busPIRone (BUSPAR) 15 MG tablet 1 tablet Orally Twice a day for 30 days     CRESTOR 10 MG tablet 1 tablet Orally Once a day for 90 days     dicyclomine (BENTYL) 10 MG capsule 1 capsule 30 minutes before eating Orally Three times a day for 90 days     DULoxetine (CYMBALTA) 30 MG capsule 1 capsule Orally Once a day for 90 days     oxyCODONE (OXYCONTIN) 10 mg 12 hr tablet Take 1 tablet (10 mg total) by mouth every 12 (twelve) hours. 60 tablet 0   Oxycodone HCl 10 MG TABS Take by mouth.     pregabalin (LYRICA) 75 MG capsule 1 capsule Orally twice a day for 90 days     propranolol (INDERAL) 20 MG tablet 1 tablet Orally Twice a day for 30 days     HYDROcodone-acetaminophen (NORCO) 5-325 MG tablet Take 1-2 tablets by mouth every 6 (six) hours as needed for moderate pain or severe pain. (Patient not taking: Reported on 11/18/2022) 20 tablet 0   omeprazole (PRILOSEC) 40 MG capsule Take 40 mg by mouth daily. (Patient not taking: Reported on 11/18/2022)     promethazine (PHENERGAN) 25 MG tablet Take 25 mg by mouth every 6 (six) hours as needed for nausea or vomiting. (Patient not taking: Reported on 11/18/2022)     No current facility-administered medications for this visit.    REVIEW OF SYSTEMS:   Constitutional: ( - ) fevers, ( - )  chills , ( - ) night sweats Eyes: ( - ) blurriness of vision, ( - ) double vision, ( - ) watery eyes Ears, nose, mouth, throat, and face: ( - ) mucositis, ( -  ) sore throat Respiratory: ( - ) cough, ( - ) dyspnea, ( - ) wheezes Cardiovascular: ( - ) palpitation, ( - ) chest discomfort, ( - ) lower extremity swelling Gastrointestinal:  ( - ) nausea, ( - ) heartburn, ( - ) change in bowel habits Skin: ( - ) abnormal skin rashes Lymphatics: ( - ) new lymphadenopathy, ( - ) easy bruising Neurological: ( - ) numbness, ( - )  tingling, ( - ) new weaknesses Behavioral/Psych: ( - ) mood change, ( - ) new changes  All other systems were reviewed with the patient and are negative.  PHYSICAL EXAMINATION: ECOG PERFORMANCE STATUS: 1 - Symptomatic but completely ambulatory  Vitals:   11/18/22 1400  BP: 118/87  Pulse: (!) 105  Resp: 18  Temp: 98.3 F (36.8 C)  SpO2: 100%   Filed Weights   11/18/22 1400  Weight: 138 lb 14.4 oz (63 kg)    GENERAL: well appearing female in NAD  SKIN: skin color, texture, turgor are normal, no rashes or significant lesions EYES: conjunctiva are pink and non-injected, sclera clear OROPHARYNX: no exudate, no erythema; lips, buccal mucosa, and tongue normal  NECK: supple, non-tender LYMPH:  no palpable lymphadenopathy in the cervical, axillary or supraclavicular lymph nodes.  LUNGS: clear to auscultation and percussion with normal breathing effort HEART: regular rate & rhythm and no murmurs and no lower extremity edema ABDOMEN: soft, non-tender, non-distended, normal bowel sounds Musculoskeletal: no cyanosis of digits and no clubbing  PSYCH: alert & oriented x 3, fluent speech NEURO: no focal motor/sensory deficits  LABORATORY DATA:  I have reviewed the data as listed    Latest Ref Rng & Units 11/16/2022   10:42 AM 04/20/2018    8:23 AM 03/25/2018    1:27 PM  CBC  WBC 4.0 - 10.5 K/uL 7.5  4.5  3.6   Hemoglobin 12.0 - 15.0 g/dL 16.1  09.6  04.5   Hematocrit 36.0 - 46.0 % 37.4  38.1  39.9   Platelets 150 - 400 K/uL 296  249  315        Latest Ref Rng & Units 11/16/2022   10:42 AM 04/20/2018    8:23 AM  03/25/2018   10:27 PM  CMP  Glucose 70 - 99 mg/dL 409  90    BUN 6 - 20 mg/dL 7  6    Creatinine 8.11 - 1.00 mg/dL 9.14  7.82    Sodium 956 - 145 mmol/L 140  140    Potassium 3.5 - 5.1 mmol/L 4.0  3.0  2.9   Chloride 98 - 111 mmol/L 105  106    CO2 22 - 32 mmol/L 31  26    Calcium 8.9 - 10.3 mg/dL 21.3  9.4    Total Protein 6.5 - 8.1 g/dL 7.3  6.7    Total Bilirubin 0.3 - 1.2 mg/dL 0.5  0.8    Alkaline Phos 38 - 126 U/L 99  94    AST 15 - 41 U/L 33  22    ALT 0 - 44 U/L 23  17       RADIOGRAPHIC STUDIES: I have personally reviewed the radiological images as listed and agreed with the findings in the report. CT Angio Chest/Abd/Pel for Dissection W and/or Wo Contrast  Result Date: 11/16/2022 CLINICAL DATA:  Acute aortic syndrome, low back pain, chest pain, left arm pain EXAM: CT ANGIOGRAPHY CHEST, ABDOMEN AND PELVIS TECHNIQUE: Non-contrast CT of the chest was initially obtained. Multidetector CT imaging through the chest, abdomen and pelvis was performed using the standard protocol during bolus administration of intravenous contrast. Multiplanar reconstructed images and MIPs were obtained and reviewed to evaluate the vascular anatomy. RADIATION DOSE REDUCTION: This exam was performed according to the departmental dose-optimization program which includes automated exposure control, adjustment of the mA and/or kV according to patient size and/or use of iterative reconstruction technique. CONTRAST:  OMNIPAQUE IOHEXOL 350 MG/ML SOLN COMPARISON:  03/25/2018 FINDINGS: CTA CHEST FINDINGS Cardiovascular: SVC patent. Heart size normal. Trace pericardial fluid. The RV is nondilated. Satisfactory opacification of pulmonary arteries noted, and there is no evidence of pulmonary emboli. Adequate contrast opacification of the thoracic aorta with no evidence of dissection, aneurysm, or stenosis. There is classic 3-vessel brachiocephalic arch anatomy without proximal stenosis. Minimal nonocclusive  atheromatous aortic plaque. Mediastinum/Nodes: No mediastinal hematoma, mass or adenopathy. Lungs/Pleura: No pleural effusion. No pneumothorax. 1.2 cm somewhat lobular left upper lobe nodule (Im29,Se7) . lungs otherwise clear. Musculoskeletal: Cervical fixation hardware. 1 cm lytic lesion in T4 vertebral body centered to the left of midline, with erosion of the posterior cortex. 0.9 cm lytic lesion in the T12 vertebral body anterior to the right pedicle. Review of the MIP images confirms the above findings. CTA ABDOMEN AND PELVIS FINDINGS VASCULAR Aorta: Moderate partially calcified atheromatous plaque. No aneurysm, dissection, or stenosis. Celiac: Patent without evidence of aneurysm, dissection, vasculitis or significant stenosis. SMA: Patent without evidence of aneurysm, dissection, vasculitis or significant stenosis. Renals: Both renal arteries are patent without evidence of aneurysm, dissection, vasculitis, fibromuscular dysplasia or significant stenosis. IMA: Patent without evidence of aneurysm, dissection, vasculitis or significant stenosis. Inflow: Minimal nonocclusive plaque. No aneurysm or dissection. Visualized proximal outflow mildly atheromatous, patent. Veins: No obvious venous abnormality within the limitations of this arterial phase study. Review of the MIP images confirms the above findings. NON-VASCULAR Hepatobiliary: Multiple ill-defined liver lesions e.g. 1.2 cm low-attenuation lesion (Im128,Se5) . cholecystectomy clips. No biliary ductal dilatation. Pancreas: Unremarkable. No pancreatic ductal dilatation or surrounding inflammatory changes. Spleen: Normal in size without focal abnormality. Adrenals/Urinary Tract: Adrenal glands are unremarkable. Kidneys are normal, without renal calculi, focal lesion, or hydronephrosis. Bladder is unremarkable. Stomach/Bowel: Stomach is within normal limits. Appendix appears normal. No evidence of bowel wall thickening, distention, or inflammatory changes.  Lymphatic: No abdominal or pelvic adenopathy. Reproductive: Status post hysterectomy. No adnexal masses. Other: No ascites.  Pelvic phleboliths.  No free air. Musculoskeletal: Poorly marginated lytic lesion in the posterior aspect of the L5 vertebral body, with pathologic fracture of the superior endplate. Additional smaller ill-defined lytic lesions in L1 and L4 vertebral bodies. Mild bilateral hip DJD. Review of the MIP images confirms the above findings. IMPRESSION: 1. Negative for aortic dissection or aneurysm. 2. 1.2 cm left upper lobe nodule.  For 3. Multiple ill-defined liver lesions, suspicious for metastatic disease. 4. Multiple lytic osseous lesions, suspicious for metastatic disease. Pathologic fracture of the superior endplate of L5. Electronically Signed   By: Corlis Leak M.D.   On: 11/16/2022 16:38   DG Chest Port 1 View  Result Date: 11/16/2022 CLINICAL DATA:  Increased chest pain today. EXAM: PORTABLE CHEST 1 VIEW COMPARISON:  None Available. FINDINGS: No consolidation, pneumothorax or effusion. No edema. Normal cardiopericardial silhouette. Overlapping cardiac leads. Surgical changes along the lower cervical spine at the edge of the imaging field. There is focal nodular density overlying the left upper lobe measuring 11 mm. This overlies the posterior aspect of the left sixth rib. Surgical clips in the right upper quadrant. IMPRESSION: 11 mm left upper lobe lung nodule. Please correlate with any prior dedicated chest CT with contrast to further delineate when appropriate Electronically Signed   By: Karen Kays M.D.   On: 11/16/2022 12:37   CT HEAD WO CONTRAST ( )  Result Date: 11/12/2022 CLINICAL DATA:  Provided history: Headache, unspecified type. Additional history provided by the scanning technologist: The patient reports left-sided facial numbness. EXAM: CT HEAD WITHOUT CONTRAST TECHNIQUE: Contiguous  axial images were obtained from the base of the skull through the vertex without  intravenous contrast. RADIATION DOSE REDUCTION: This exam was performed according to the departmental dose-optimization program which includes automated exposure control, adjustment of the mA and/or kV according to patient size and/or use of iterative reconstruction technique. COMPARISON:  Head CT 02/22/2014. FINDINGS: Brain: Cerebral volume is normal. There is no acute intracranial hemorrhage. No demarcated cortical infarct. No extra-axial fluid collection. No evidence of an intracranial mass. No midline shift. Vascular: No hyperdense vessel. Atherosclerotic calcifications within the intracranial internal carotid arteries. Skull: No calvarial fracture or aggressive osseous lesion. Sinuses/Orbits: No mass or acute finding within the imaged orbits. 2 cm mucous retention cyst or polyp within the right maxillary sinus. Small-volume frothy secretions within a posterior right ethmoid air cell. IMPRESSION: 1. Unremarkable non-contrast CT appearance of the brain. No evidence of an acute intracranial abnormality. 2. Atherosclerotic calcifications within the intracranial internal carotid arteries. 3. Paranasal sinus disease as described. Electronically Signed   By: Jackey Loge D.O.   On: 11/12/2022 20:48    ASSESSMENT & PLAN Laura [name] is a 57 y.o. female who presents to the rapid diagnostic clinic for evaluation of abnormal CT scan concerning for malignancy.   #Liver lesions #LUL nodule #Bone lesions --Seen on CT CAP from 11/16/2022, concerning for metastatic disease --Patient reports she is up to date with colon cancer screening but not with mammogram. Last documented mammogram was in 2017.  --Labs today to recheck CBC, CMP. Will check breast tumor markers, GI tumor markers and multiple myeloma panel --Need tissue confirmation with US guided liver biopsy, scheduled for 11/25/2022.  --RTC once workup is complete.   #Low back pain  #L5 superior endplate pathologic fracture --Likely secondary to lytic  lesion. --Sent oxycontin 10 mg q 12 hours. Recommend to take oxycodone 10 mg q 4-6 hours for breakthrough pain --Consider kyphoplasty to address pathologic fracture.   #Family history of breast cancer: --Consider genetic testing once diagnosis is confirmed.  **ADDENDUM: --LFTs acutely elevated compared to ED visit on 11/16/2022. AST 156, ALT 206, Alk phos 201. Patient denies excessive tylenol intake or new supplements. Advised to avoid tylenol intake, alcohol consumption. Possibly secondary to underlying liver lesions. Will recheck levels next week on 11/25/2022 prior to liver biopsy.   Orders Placed This Encounter  Procedures   US BIOPSY (LIVER)    Standing Status:   Future    Standing Expiration Date:   11/18/2023    Order Specific Question:   Lab orders requested (DO NOT place separate lab orders, these will be automatically ordered during procedure specimen collection):    Answer:   Surgical Pathology    Order Specific Question:   Reason for Exam (SYMPTOM  OR DIAGNOSIS REQUIRED)    Answer:   abnormal CT scan 11/16/2022 concerning for metastatic disease.    Order Specific Question:   Preferred location?    Answer:   [facility]   CBC with Differential (Cancer Center Only)    Standing Status:   Future    Standing Expiration Date:   11/18/2023   CMP (Cancer Center only)    Standing Status:   Future    Standing Expiration Date:   11/18/2023   Multiple Myeloma Panel (SPEP&IFE w/QIG)    Standing Status:   Future    Standing Expiration Date:   11/18/2023   Kappa/lambda light chains    Standing Status:   Future    Standing Expiration Date:   11/18/2023  CA 27.29    Standing Status:   Future    Standing Expiration Date:   11/18/2023   CA 15.3    Standing Status:   Future    Standing Expiration Date:   11/18/2023   CEA (Access)-[facility] ONLY    Standing Status:   Future    Standing Expiration Date:   11/18/2023   CA 19.9    Standing Status:   Future    Standing Expiration  Date:   11/18/2023    All questions were answered. The patient knows to call the clinic with any problems, questions or concerns.  I have spent a total of 60 minutes minutes of face-to-face and non-face-to-face time, preparing to see the patient, obtaining and/or reviewing separately obtained history, performing a medically appropriate examination, counseling and educating the patient, ordering medications/tests/procedures, referring and communicating with other health care professionals, documenting clinical information in the electronic health record, independently interpreting results and communicating results to the patient, and care coordination.   Georga Kaufmann, [name] Department of Hematology/Oncology [facility] Phone: [phone]

## 2022-11-18 NOTE — Progress Notes (Unsigned)
PROCEDURE / BIOPSY REVIEW Date: 11/18/22  Requested Biopsy site: Liver Reason for request: Mass. Metastatic disease, no DX Imaging review: Best seen on CTA CAP, 11/16/22  Decision: Approved Imaging modality to perform: Ultrasound Schedule with: Moderate Sedation Schedule for: Any VIR  Additional comments:  @VIR : multifocal liver masses. @Schedulers . US Liver Mass Bx. Mod sed. INR on procedure day  Please contact me with questions, concerns, or if issue pertaining to this request arise.  Roanna Banning, MD [facility]

## 2022-11-19 LAB — CANCER ANTIGEN 27.29: CA 27.29: 19.1 U/mL (ref 0.0–38.6)

## 2022-11-19 LAB — CANCER ANTIGEN 19-9: CA 19-9: 23 U/mL (ref 0–35)

## 2022-11-19 LAB — CEA (ACCESS): CEA (CHCC): 1.82 ng/mL (ref 0.00–5.00)

## 2022-11-19 LAB — CANCER ANTIGEN 15-3: CA 15-3: 18.1 U/mL (ref 0.0–25.0)

## 2022-11-20 ENCOUNTER — Encounter: Payer: Self-pay | Admitting: Physician Assistant

## 2022-11-20 LAB — KAPPA/LAMBDA LIGHT CHAINS
Kappa free light chain: 24.6 mg/L — ABNORMAL HIGH (ref 3.3–19.4)
Kappa, lambda light chain ratio: 1.66 — ABNORMAL HIGH (ref 0.26–1.65)
Lambda free light chains: 14.8 mg/L (ref 5.7–26.3)

## 2022-11-23 LAB — MULTIPLE MYELOMA PANEL, SERUM
Albumin SerPl Elph-Mcnc: 4 g/dL (ref 2.9–4.4)
Albumin/Glob SerPl: 1.3 (ref 0.7–1.7)
Alpha 1: 0.3 g/dL (ref 0.0–0.4)
Alpha2 Glob SerPl Elph-Mcnc: 0.8 g/dL (ref 0.4–1.0)
B-Globulin SerPl Elph-Mcnc: 1.1 g/dL (ref 0.7–1.3)
Gamma Glob SerPl Elph-Mcnc: 0.8 g/dL (ref 0.4–1.8)
Globulin, Total: 3.1 g/dL (ref 2.2–3.9)
IgA: 169 mg/dL (ref 87–352)
IgG (Immunoglobin G), Serum: 855 mg/dL (ref 586–1602)
IgM (Immunoglobulin M), Srm: 79 mg/dL (ref 26–217)
Total Protein ELP: 7.1 g/dL (ref 6.0–8.5)

## 2022-11-24 ENCOUNTER — Other Ambulatory Visit: Payer: Self-pay | Admitting: Hematology and Oncology

## 2022-11-24 ENCOUNTER — Telehealth: Payer: Self-pay

## 2022-11-24 ENCOUNTER — Other Ambulatory Visit (HOSPITAL_COMMUNITY): Payer: Self-pay | Admitting: Student

## 2022-11-24 ENCOUNTER — Telehealth: Payer: Self-pay | Admitting: Physician Assistant

## 2022-11-24 ENCOUNTER — Encounter: Payer: Self-pay | Admitting: Physician Assistant

## 2022-11-24 DIAGNOSIS — K769 Liver disease, unspecified: Secondary | ICD-10-CM

## 2022-11-24 MED ORDER — OXYCODONE HCL 10 MG PO TABS: 10.0000 mg | ORAL_TABLET | Freq: Four times a day (QID) | ORAL | 0 refills | Status: DC | PRN

## 2022-11-24 MED ORDER — ONDANSETRON HCL 8 MG PO TABS: 8.0000 mg | ORAL_TABLET | Freq: Three times a day (TID) | ORAL | 0 refills | Status: DC | PRN

## 2022-11-24 NOTE — Telephone Encounter (Signed)
Notified Patient of prior authorization approval for Oxycodone HCL 10 mg tablets. Medication is approved through 11/24/2023. No other needs or concerns voiced at this time.

## 2022-11-24 NOTE — Telephone Encounter (Signed)
I notified Teresa Pelton by phone regarding lab results. The multiple myeloma panel is overall unremarkable. There is no evidence of monoclonal protein. Patient is scheduled for liver biopsy tomorrow. I plan to follow up with patient pending biopsy results. Patient also informed that prescriptions for Zofran and oxycodone were sent to her pharmacy [facility] on [address] [facility] by Dr. Leonides Schanz today. She plans to pick them up today and will reach out if there are any issues doing so. All of patient's questions were answered and she expressed understanding of the plan provided.

## 2022-11-24 NOTE — Progress Notes (Signed)
Patient for Laura [name] guided Liver Biopsy on Wed 11/25/2022, I called and spoke with the patient on the phone and gave pre-procedure instructions. Pt was made aware to be here at 10a, NPO after MN prior to procedure as well as driver post procedure/recovery/discharge. Pt stated understanding.  Called 11/24/2022

## 2022-11-24 NOTE — H&P (Signed)
Chief Complaint: Patient was seen in consultation today for liver lesion biopsy  Referring Physician(s): Briant Cedar  Supervising Physician: Pernell Dupre  Patient Status: [facility] - Out-pt  History of Present Illness: Laura [name] is a 57 y.o. female with a medical history significant for fibromyalgia and melanoma of the back s/p incision. She presented to the ED last week for evaluation of back pain that had been progressively worsening over two weeks. She also had complaints of chest pain that had been ongoing for several months. Imaging obtained showed multiple liver lesions, multiple lytic lesions with an L5 pathologic fracture and a LUL nodule. She was treated conservatively in the ED and discharged with outpatient oncology follow up.  She met with her oncology team 11/18/22 and a liver lesion biopsy has been requested.  Imaging reviewed and procedure approved by Dr. Milford Cage.   Past Medical History:  Diagnosis Date   Anxiety    Arthritis    Complication of anesthesia    Fibromyalgia    Gallstones 04/20/2018   GERD (gastroesophageal reflux disease)    History of blood transfusion    post Hysterectomy   Hypercholesteremia    Melanoma (HCC)    on back and excised   Migraine    PONV (postoperative nausea and vomiting)    Tobacco abuse    Vertebral artery disease (HCC)    history of left vertebral artery dissection 2011    Past Surgical History:  Procedure Laterality Date   ABDOMINAL HYSTERECTOMY     ANTERIOR FUSION CERVICAL SPINE  2008   BONE GRAFT HIP ILIAC CREST Left    Left Hip to Left Wrist   CHOLECYSTECTOMY N/A 04/20/2018   Procedure: LAPAROSCOPIC CHOLECYSTECTOMY;  Surgeon: Claud Kelp, MD;  Location: [facility] OR;  Service: General;  Laterality: N/A;   thumb surgery Left 2018   [facility]- Tendons Carpal tunnel   WRIST SURGERY  1994    Allergies: Aspirin  Medications: Prior to Admission medications   Medication Sig Start Date End Date Taking? Authorizing  Provider  AMBIEN CR 6.25 MG CR tablet 1 tablet at bedtime as needed Orally Once a day for 90 days 05/22/19   [provider]  busPIRone (BUSPAR) 15 MG tablet 1 tablet Orally Twice a day for 30 days    [provider]  CRESTOR 10 MG tablet 1 tablet Orally Once a day for 90 days    [provider]  dicyclomine (BENTYL) 10 MG capsule 1 capsule 30 minutes before eating Orally Three times a day for 90 days    [provider]  DULoxetine (CYMBALTA) 30 MG capsule 1 capsule Orally Once a day for 90 days    [provider]  oxyCODONE (OXYCONTIN) 10 mg 12 hr tablet Take 1 tablet (10 mg total) by mouth every 12 (twelve) hours. 11/18/22   Briant Cedar, [name]  Oxycodone HCl 10 MG TABS Take by mouth.    [provider]  pregabalin (LYRICA) 75 MG capsule 1 capsule Orally twice a day for 90 days 08/21/19   [provider]  promethazine (PHENERGAN) 25 MG tablet Take 25 mg by mouth every 6 (six) hours as needed for nausea or vomiting. Patient not taking: Reported on 11/18/2022    [provider]  propranolol (INDERAL) 20 MG tablet 1 tablet Orally Twice a day for 30 days 09/30/22   [provider]     Family History  Problem Relation Age of Onset   Lung cancer Father  smoking hx   Breast cancer Sister        diagnosed over the age of 34   Breast cancer Sister        diagnosed over the age of 26    Social History   Socioeconomic History   Marital status: Married    Spouse name: Not on file   Number of children: Not on file   Years of education: Not on file   Highest education level: Not on file  Occupational History   Not on file  Tobacco Use   Smoking status: Former    Current packs/day: 0.00    Average packs/day: 1 pack/day for 20.0 years (20.0 ttl pk-yrs)    Types: Cigarettes    Start date: 16    Quit date: 2018    Years since quitting: 6.8   Smokeless tobacco: Never  Vaping Use   Vaping status: Every  Day   Substances: Nicotine  Substance and Sexual Activity   Alcohol use: No    Alcohol/week: 0.0 standard drinks of alcohol   Drug use: No   Sexual activity: Not on file  Other Topics Concern   Not on file  Social History Narrative   Married,  3 kids.   Lives home with husband,  Works at [employer].  Education 12th grade.  Caffeine pepsi daily and tea daily.   '   Social Determinants of Health   Financial Resource Strain: Not on file  Food Insecurity: Not on file  Transportation Needs: Not on file  Physical Activity: Not on file  Stress: Not on file  Social Connections: Not on file    Review of Systems: A 12 point ROS discussed and pertinent positives are indicated in the HPI above.  All other systems are negative.  Review of Systems  Vital Signs: There were no vitals taken for this visit.  Physical Exam  Imaging: CT Angio Chest/Abd/Pel for Dissection W and/or Wo Contrast  Result Date: 11/16/2022 CLINICAL DATA:  Acute aortic syndrome, low back pain, chest pain, left arm pain EXAM: CT ANGIOGRAPHY CHEST, ABDOMEN AND PELVIS TECHNIQUE: Non-contrast CT of the chest was initially obtained. Multidetector CT imaging through the chest, abdomen and pelvis was performed using the standard protocol during bolus administration of intravenous contrast. Multiplanar reconstructed images and MIPs were obtained and reviewed to evaluate the vascular anatomy. RADIATION DOSE REDUCTION: This exam was performed according to the departmental dose-optimization program which includes automated exposure control, adjustment of the mA and/or kV according to patient size and/or use of iterative reconstruction technique. CONTRAST:  OMNIPAQUE IOHEXOL 350 MG/ML SOLN COMPARISON:  03/25/2018 FINDINGS: CTA CHEST FINDINGS Cardiovascular: SVC patent. Heart size normal. Trace pericardial fluid. The RV is nondilated. Satisfactory opacification of pulmonary arteries noted, and there is no evidence of pulmonary emboli.  Adequate contrast opacification of the thoracic aorta with no evidence of dissection, aneurysm, or stenosis. There is classic 3-vessel brachiocephalic arch anatomy without proximal stenosis. Minimal nonocclusive atheromatous aortic plaque. Mediastinum/Nodes: No mediastinal hematoma, mass or adenopathy. Lungs/Pleura: No pleural effusion. No pneumothorax. 1.2 cm somewhat lobular left upper lobe nodule (Im29,Se7) . lungs otherwise clear. Musculoskeletal: Cervical fixation hardware. 1 cm lytic lesion in T4 vertebral body centered to the left of midline, with erosion of the posterior cortex. 0.9 cm lytic lesion in the T12 vertebral body anterior to the right pedicle. Review of the MIP images confirms the above findings. CTA ABDOMEN AND PELVIS FINDINGS VASCULAR Aorta: Moderate partially calcified atheromatous plaque. No aneurysm, dissection, or stenosis.  Celiac: Patent without evidence of aneurysm, dissection, vasculitis or significant stenosis. SMA: Patent without evidence of aneurysm, dissection, vasculitis or significant stenosis. Renals: Both renal arteries are patent without evidence of aneurysm, dissection, vasculitis, fibromuscular dysplasia or significant stenosis. IMA: Patent without evidence of aneurysm, dissection, vasculitis or significant stenosis. Inflow: Minimal nonocclusive plaque. No aneurysm or dissection. Visualized proximal outflow mildly atheromatous, patent. Veins: No obvious venous abnormality within the limitations of this arterial phase study. Review of the MIP images confirms the above findings. NON-VASCULAR Hepatobiliary: Multiple ill-defined liver lesions e.g. 1.2 cm low-attenuation lesion (Im128,Se5) . cholecystectomy clips. No biliary ductal dilatation. Pancreas: Unremarkable. No pancreatic ductal dilatation or surrounding inflammatory changes. Spleen: Normal in size without focal abnormality. Adrenals/Urinary Tract: Adrenal glands are unremarkable. Kidneys are normal, without renal calculi,  focal lesion, or hydronephrosis. Bladder is unremarkable. Stomach/Bowel: Stomach is within normal limits. Appendix appears normal. No evidence of bowel wall thickening, distention, or inflammatory changes. Lymphatic: No abdominal or pelvic adenopathy. Reproductive: Status post hysterectomy. No adnexal masses. Other: No ascites.  Pelvic phleboliths.  No free air. Musculoskeletal: Poorly marginated lytic lesion in the posterior aspect of the L5 vertebral body, with pathologic fracture of the superior endplate. Additional smaller ill-defined lytic lesions in L1 and L4 vertebral bodies. Mild bilateral hip DJD. Review of the MIP images confirms the above findings. IMPRESSION: 1. Negative for aortic dissection or aneurysm. 2. 1.2 cm left upper lobe nodule.  For 3. Multiple ill-defined liver lesions, suspicious for metastatic disease. 4. Multiple lytic osseous lesions, suspicious for metastatic disease. Pathologic fracture of the superior endplate of L5. Electronically Signed   By: Corlis Leak M.D.   On: 11/16/2022 16:38   DG Chest Port 1 View  Result Date: 11/16/2022 CLINICAL DATA:  Increased chest pain today. EXAM: PORTABLE CHEST 1 VIEW COMPARISON:  None Available. FINDINGS: No consolidation, pneumothorax or effusion. No edema. Normal cardiopericardial silhouette. Overlapping cardiac leads. Surgical changes along the lower cervical spine at the edge of the imaging field. There is focal nodular density overlying the left upper lobe measuring 11 mm. This overlies the posterior aspect of the left sixth rib. Surgical clips in the right upper quadrant. IMPRESSION: 11 mm left upper lobe lung nodule. Please correlate with any prior dedicated chest CT with contrast to further delineate when appropriate Electronically Signed   By: Karen Kays M.D.   On: 11/16/2022 12:37   CT HEAD WO CONTRAST ( )  Result Date: 11/12/2022 CLINICAL DATA:  Provided history: Headache, unspecified type. Additional history provided by the  scanning technologist: The patient reports left-sided facial numbness. EXAM: CT HEAD WITHOUT CONTRAST TECHNIQUE: Contiguous axial images were obtained from the base of the skull through the vertex without intravenous contrast. RADIATION DOSE REDUCTION: This exam was performed according to the departmental dose-optimization program which includes automated exposure control, adjustment of the mA and/or kV according to patient size and/or use of iterative reconstruction technique. COMPARISON:  Head CT 02/22/2014. FINDINGS: Brain: Cerebral volume is normal. There is no acute intracranial hemorrhage. No demarcated cortical infarct. No extra-axial fluid collection. No evidence of an intracranial mass. No midline shift. Vascular: No hyperdense vessel. Atherosclerotic calcifications within the intracranial internal carotid arteries. Skull: No calvarial fracture or aggressive osseous lesion. Sinuses/Orbits: No mass or acute finding within the imaged orbits. 2 cm mucous retention cyst or polyp within the right maxillary sinus. Small-volume frothy secretions within a posterior right ethmoid air cell. IMPRESSION: 1. Unremarkable non-contrast CT appearance of the brain. No evidence of an acute intracranial abnormality.  2. Atherosclerotic calcifications within the intracranial internal carotid arteries. 3. Paranasal sinus disease as described. Electronically Signed   By: Jackey Loge D.O.   On: 11/12/2022 20:48    Labs:  CBC: Recent Labs    11/16/22 1042 11/18/22 1524  WBC 7.5 6.8  HGB 13.1 13.8  HCT 37.4 40.2  PLT 296 325    COAGS: No results for input(s): "INR", "APTT" in the last 8760 hours.  BMP: Recent Labs    11/16/22 1042 11/18/22 1524  NA 140 139  K 4.0 3.6  CL 105 100  CO2 31 33*  GLUCOSE 120* 95  BUN 7 8  CALCIUM 10.2 10.4*  CREATININE 0.92 0.94  GFRNONAA >60 >60    LIVER FUNCTION TESTS: Recent Labs    11/16/22 1042 11/18/22 1524  BILITOT 0.5 0.6  AST 33 156*  ALT 23 206*   ALKPHOS 99 201*  PROT 7.3 8.0  ALBUMIN 4.5 4.7    TUMOR MARKERS: Recent Labs    11/18/22 1524  CEA 1.82    Assessment and Plan:  Liver lesions; concern for metastatic disease: Sreenidhi L. Abrew, 56 year old female, presents today to the [facility] Interventional Radiology department for an image-guided liver lesion biopsy.  Risks and benefits of this procedure were discussed with the patient and/or patient's family including, but not limited to bleeding, infection, damage to adjacent structures or low yield requiring additional tests.  All of the questions were answered and there is agreement to proceed. She has been NPO. She is a full code. She does not take any blood-thinning medications.   Consent signed and in chart.  Thank you for this interesting consult.  I greatly enjoyed meeting CLEOLA BRANDLEY and look forward to participating in their care.  A copy of this report was sent to the requesting provider on this date.  Electronically Signed: Alwyn Ren, AGACNP-BC [phone] 11/24/2022, 1:29 PM   I spent a total of  30 Minutes   in face to face in clinical consultation, greater than 50% of which was counseling/coordinating care for liver lesion biopsy.

## 2022-11-25 ENCOUNTER — Other Ambulatory Visit: Payer: Self-pay | Admitting: *Deleted

## 2022-11-25 ENCOUNTER — Ambulatory Visit
Admission: RE | Admit: 2022-11-25 | Discharge: 2022-11-25 | Disposition: A | Discharge status: Home / Self Care | Payer: BC Managed Care – PPO | Source: Ambulatory Visit | Attending: Physician Assistant | Admitting: Physician Assistant

## 2022-11-25 ENCOUNTER — Other Ambulatory Visit: Payer: Self-pay

## 2022-11-25 ENCOUNTER — Inpatient Hospital Stay: Payer: BC Managed Care – PPO

## 2022-11-25 DIAGNOSIS — K769 Liver disease, unspecified: Secondary | ICD-10-CM

## 2022-11-25 DIAGNOSIS — C787 Secondary malignant neoplasm of liver and intrahepatic bile duct: Secondary | ICD-10-CM | POA: Insufficient documentation

## 2022-11-25 DIAGNOSIS — R748 Abnormal levels of other serum enzymes: Secondary | ICD-10-CM

## 2022-11-25 DIAGNOSIS — M797 Fibromyalgia: Secondary | ICD-10-CM | POA: Insufficient documentation

## 2022-11-25 DIAGNOSIS — G629 Polyneuropathy, unspecified: Secondary | ICD-10-CM | POA: Diagnosis not present

## 2022-11-25 DIAGNOSIS — R079 Chest pain, unspecified: Secondary | ICD-10-CM | POA: Diagnosis not present

## 2022-11-25 DIAGNOSIS — Z9049 Acquired absence of other specified parts of digestive tract: Secondary | ICD-10-CM | POA: Diagnosis not present

## 2022-11-25 DIAGNOSIS — Z9889 Other specified postprocedural states: Secondary | ICD-10-CM | POA: Diagnosis not present

## 2022-11-25 DIAGNOSIS — Z85820 Personal history of malignant melanoma of skin: Secondary | ICD-10-CM | POA: Diagnosis not present

## 2022-11-25 DIAGNOSIS — E7800 Pure hypercholesterolemia, unspecified: Secondary | ICD-10-CM | POA: Diagnosis not present

## 2022-11-25 DIAGNOSIS — M545 Low back pain, unspecified: Secondary | ICD-10-CM | POA: Diagnosis not present

## 2022-11-25 DIAGNOSIS — K219 Gastro-esophageal reflux disease without esophagitis: Secondary | ICD-10-CM | POA: Diagnosis not present

## 2022-11-25 DIAGNOSIS — C801 Malignant (primary) neoplasm, unspecified: Secondary | ICD-10-CM | POA: Diagnosis not present

## 2022-11-25 DIAGNOSIS — G893 Neoplasm related pain (acute) (chronic): Secondary | ICD-10-CM | POA: Diagnosis not present

## 2022-11-25 DIAGNOSIS — C7951 Secondary malignant neoplasm of bone: Secondary | ICD-10-CM | POA: Diagnosis not present

## 2022-11-25 LAB — CMP (CANCER CENTER ONLY)
ALT: 38 U/L (ref 0–44)
AST: 41 U/L (ref 15–41)
Albumin: 4.4 g/dL (ref 3.5–5.0)
Alkaline Phosphatase: 132 U/L — ABNORMAL HIGH (ref 38–126)
Anion gap: 9 (ref 5–15)
BUN: 6 mg/dL (ref 6–20)
CO2: 26 mmol/L (ref 22–32)
Calcium: 9.5 mg/dL (ref 8.9–10.3)
Chloride: 100 mmol/L (ref 98–111)
Creatinine: 1 mg/dL (ref 0.44–1.00)
GFR, Estimated: >60 mL/min (ref >60)
Glucose, Bld: 125 mg/dL — ABNORMAL HIGH (ref 70–99)
Potassium: 3.6 mmol/L (ref 3.5–5.1)
Sodium: 135 mmol/L (ref 135–145)
Total Bilirubin: 0.6 mg/dL (ref 0.3–1.2)
Total Protein: 7.5 g/dL (ref 6.5–8.1)

## 2022-11-25 LAB — CBC WITH DIFFERENTIAL (CANCER CENTER ONLY)
Abs Immature Granulocytes: 0.02 10*3/uL (ref 0.00–0.07)
Basophils Absolute: 0 10*3/uL (ref 0.0–0.1)
Basophils Relative: 1 %
Eosinophils Absolute: 0.1 10*3/uL (ref 0.0–0.5)
Eosinophils Relative: 2 %
HCT: 39.5 % (ref 36.0–46.0)
Hemoglobin: 13.8 g/dL (ref 12.0–15.0)
Immature Granulocytes: 0 %
Lymphocytes Relative: 14 %
Lymphs Abs: 1.1 10*3/uL (ref 0.7–4.0)
MCH: 32.5 pg (ref 26.0–34.0)
MCHC: 34.9 g/dL (ref 30.0–36.0)
MCV: 92.9 fL (ref 80.0–100.0)
Monocytes Absolute: 0.4 10*3/uL (ref 0.1–1.0)
Monocytes Relative: 5 %
Neutro Abs: 5.7 10*3/uL (ref 1.7–7.7)
Neutrophils Relative %: 78 %
Platelet Count: 344 10*3/uL (ref 150–400)
RBC: 4.25 MIL/uL (ref 3.87–5.11)
RDW: 12.7 % (ref 11.5–15.5)
WBC Count: 7.3 10*3/uL (ref 4.0–10.5)
nRBC: 0 % (ref 0.0–0.2)

## 2022-11-25 LAB — PROTIME-INR
INR: 1.1 (ref 0.8–1.2)
Prothrombin Time: 14.2 s (ref 11.4–15.2)

## 2022-11-25 MED ORDER — LIDOCAINE HCL (PF) 1 % IJ SOLN
10.0000 mL | Freq: Once | INTRAMUSCULAR | Status: AC
  Administered 2022-11-25: 10 mL via INTRADERMAL
  Filled 2022-11-25: qty 10

## 2022-11-25 MED ORDER — ONDANSETRON HCL 4 MG/2ML IJ SOLN
INTRAMUSCULAR | Status: AC
  Filled 2022-11-25: qty 2

## 2022-11-25 MED ORDER — FENTANYL CITRATE (PF) 100 MCG/2ML IJ SOLN
INTRAMUSCULAR | Status: AC
  Filled 2022-11-25: qty 2

## 2022-11-25 MED ORDER — SODIUM CHLORIDE 0.9 % IV SOLN: INTRAVENOUS | Status: DC

## 2022-11-25 MED ORDER — FENTANYL CITRATE (PF) 100 MCG/2ML IJ SOLN
INTRAMUSCULAR | Status: AC | PRN
  Administered 2022-11-25 (×2): 25 ug via INTRAVENOUS
  Administered 2022-11-25: 50 ug via INTRAVENOUS

## 2022-11-25 MED ORDER — ONDANSETRON HCL 4 MG/2ML IJ SOLN
INTRAMUSCULAR | Status: AC | PRN
  Administered 2022-11-25: 4 mg via INTRAVENOUS

## 2022-11-25 MED ORDER — MIDAZOLAM HCL 2 MG/2ML IJ SOLN
INTRAMUSCULAR | Status: AC | PRN
Start: 2022-11-25 — End: 2022-11-25
  Administered 2022-11-25: 1 mg via INTRAVENOUS
  Administered 2022-11-25 (×2): .5 mg via INTRAVENOUS

## 2022-11-25 MED ORDER — MIDAZOLAM HCL 2 MG/2ML IJ SOLN
INTRAMUSCULAR | Status: AC
  Filled 2022-11-25: qty 2

## 2022-11-25 NOTE — Progress Notes (Signed)
Patient clinically stable post US Liver biopsy per Dr Juliette Alcide, tolerated well. Vitals stable pre and post procedure/received Versed 2 mg along with Fentanyl 100 mcg IV for procedure. Report given to Marni Griffon RN  post procedure/specials/17.

## 2022-11-26 LAB — SURGICAL PATHOLOGY

## 2022-11-27 ENCOUNTER — Telehealth: Payer: Self-pay | Admitting: Physician Assistant

## 2022-11-27 ENCOUNTER — Encounter: Payer: Self-pay | Admitting: Physician Assistant

## 2022-11-27 ENCOUNTER — Other Ambulatory Visit: Payer: Self-pay

## 2022-11-27 DIAGNOSIS — C438 Malignant melanoma of overlapping sites of skin: Secondary | ICD-10-CM

## 2022-11-27 NOTE — Telephone Encounter (Signed)
I notified Laura [name] and spouse by phone regarding liver biopsy results. Findings are consistent with metastatic melanoma. Patient is scheduled to follow up with Dr. Cherly Hensen to further discuss diagnosis and plan on 12/07/22. Also informed patient that Dr. Cherly Hensen has ordered MR brain and PET for staging. All of patient's questions were answered and she expressed understanding of the plan provided. She confirmed appointment date and time.

## 2022-11-27 NOTE — Progress Notes (Signed)
MRI of brain and PET WB ordered for staging for new melanoma.    SURGICAL PATHOLOGY [facility] [address], [address] [place], [place] [address] Telephone [phone] or [phone] Fax [phone]  REPORT OF SURGICAL PATHOLOGY   Accession #: [id number] Patient Name: Laura [name], Laura [name] Visit # : [id number]  MRN: [id number] Physician: Olive Bass DOB/Age September 05, 1965 (Age: 57) Gender: F Collected Date: 11/25/2022 Received Date: 11/25/2022  FINAL DIAGNOSIS       1. Liver, needle/core biopsy, Left hepatic lobe lesion :      - METASTATIC MELANOMA.

## 2022-12-02 ENCOUNTER — Other Ambulatory Visit (HOSPITAL_COMMUNITY): Payer: Self-pay | Admitting: Family Medicine

## 2022-12-02 DIAGNOSIS — M8468XD Pathological fracture in other disease, other site, subsequent encounter for fracture with routine healing: Secondary | ICD-10-CM | POA: Diagnosis not present

## 2022-12-02 DIAGNOSIS — M899 Disorder of bone, unspecified: Secondary | ICD-10-CM | POA: Diagnosis not present

## 2022-12-02 DIAGNOSIS — R911 Solitary pulmonary nodule: Secondary | ICD-10-CM | POA: Diagnosis not present

## 2022-12-02 DIAGNOSIS — R200 Anesthesia of skin: Secondary | ICD-10-CM

## 2022-12-02 DIAGNOSIS — K769 Liver disease, unspecified: Secondary | ICD-10-CM | POA: Diagnosis not present

## 2022-12-04 ENCOUNTER — Encounter: Payer: Self-pay | Admitting: Physician Assistant

## 2022-12-04 DIAGNOSIS — C787 Secondary malignant neoplasm of liver and intrahepatic bile duct: Secondary | ICD-10-CM | POA: Insufficient documentation

## 2022-12-04 DIAGNOSIS — C439 Malignant melanoma of skin, unspecified: Secondary | ICD-10-CM | POA: Insufficient documentation

## 2022-12-04 NOTE — Progress Notes (Unsigned)
[facility] CONSULT NOTE  Patient Care Team: [name], [name] as PCP - General (Family Medicine)  ASSESSMENT & PLAN:  Gurleen is a 57 y.o.female with history of melanoma, hypertension, hyperlipidemia, GERD, arthritis being seen [facility] for stage IV melanoma.  Current diagnosis: Stage IV, pending brain MRI. Metastases to bone, liver  Discussed findings of liver metastases, bone metastases and lung nodule. Discussed she has stable IV melanoma and is no curable but treatable. Given visceral metastases consider dual immunotherapy with ipilimumab and nivolumab is reasonable. From 10 year follow up per CheckMate 067, median overall survival was 71.9 months (95% CI, 38.2 to 114.4) with nivolumab plus ipilimumab, 36.9 months (95% CI, 28.2 to 58.7) with nivolumab, and 19.9 months (95% CI, 16.8 to 24.6) with ipilimumab. Overall survival at 10 years was 43% with nivolumab plus ipilimumab, 37% with nivolumab, and 19% with ipilimumab.  We discussed that side effect will be more significant with combination immunotherapy. Side effects of immunotherapy are mostly related to immune related reaction.  They depend on which organ may be affected.  Patient can have hyper or hypothyroidism, skin rash, fever, pneumonitis, hepatitis, carditis, colitis, nephritis or other endorgan damage from immune related reaction.  Severe fatal reaction such as carditis or encephalitis has been reported. Rarely severe side effects can result in death.  Steroid is used a lot of time in the case immune related reaction.  After discussion Weronika understands and would like to proceed with combination immunotherapy.   Melanoma of skin (HCC) Will start ipi/nivo Follow up in 3 weeks Baseline labs today MRI brain pending Request BRAF status  Constipation Add benefiber, Miralax as needed and soft softener.  Cancer associated pain She saw ortho today and had been referred to IR for kyphoplasty. If  not suitable will refer for palliative sbrt   Orders Placed This Encounter  Procedures   CBC with Differential (Cancer Center Only)    Standing Status:   Future    Standing Expiration Date:   12/11/2023   CMP (Cancer Center only)    Standing Status:   Future    Standing Expiration Date:   12/11/2023   T4    Standing Status:   Future    Standing Expiration Date:   12/11/2023   TSH    Standing Status:   Future    Standing Expiration Date:   12/11/2023   CMP (Cancer Center only)    Standing Status:   Future    Standing Expiration Date:   12/07/2023   Lactate dehydrogenase    Standing Status:   Future    Standing Expiration Date:   12/07/2023   T4, free    Standing Status:   Future    Standing Expiration Date:   12/07/2023   TSH    Standing Status:   Future    Standing Expiration Date:   12/07/2023   Lactate dehydrogenase    Standing Status:   Future    Standing Expiration Date:   01/01/2024   CBC with Differential (Cancer Center Only)    Standing Status:   Future    Standing Expiration Date:   01/01/2024   CMP (Cancer Center only)    Standing Status:   Future    Standing Expiration Date:   01/01/2024   Lactate dehydrogenase    Standing Status:   Future    Standing Expiration Date:   01/22/2024   CBC with Differential (Cancer Center Only)    Standing Status:  Future    Standing Expiration Date:   01/22/2024   CMP (Cancer Center only)    Standing Status:   Future    Standing Expiration Date:   01/22/2024   T4    Standing Status:   Future    Standing Expiration Date:   01/22/2024   TSH    Standing Status:   Future    Standing Expiration Date:   01/22/2024   Lactate dehydrogenase    Standing Status:   Future    Standing Expiration Date:   02/12/2024   CBC with Differential (Cancer Center Only)    Standing Status:   Future    Standing Expiration Date:   02/12/2024   CMP (Cancer Center only)    Standing Status:   Future    Standing Expiration Date:   02/12/2024   All  questions were answered. The patient knows to call the clinic with any problems, questions or concerns. No barriers to learning was detected.  Melven Sartorius, MD 11/11/20245:07 PM  CHIEF COMPLAINTS/PURPOSE OF CONSULTATION:  melanoma  HISTORY OF PRESENTING ILLNESS:  SHAYRA LANGSTAFF 57 y.o. female is here because of melanoma I have reviewed her chart and materials related to her cancer extensively and collaborated history with the patient. Summary of oncologic history is as follows:  Report initial melanoma was about 5 years ago about right upper back.  Both sisters with breast cancer after 50. One sister had melanoma. Father had melanoma and lung and testicular cancer.  He was a smoker.   Pain in the lower back and top of hips radiating down with paresthesia.  No headaches, vision change or double vision.  Oncology History  Melanoma of skin (HCC)  11/16/2022 Imaging   She presented to the emergency room on 11/16/2022 for back pain x 2 weeks and central chest pain for several months   CTA CAP IMPRESSION: 1. Negative for aortic dissection or aneurysm. 2. 1.2 cm left upper lobe nodule.  For 3. Multiple ill-defined liver lesions, suspicious for metastatic disease. 4. Multiple lytic osseous lesions, suspicious for metastatic disease. Pathologic fracture of the superior endplate of L5   11/25/2022 Pathology Results   1. Liver, needle/core biopsy, Left hepatic lobe lesion :       - METASTATIC MELANOMA.    12/04/2022 Initial Diagnosis   Melanoma of skin (HCC) Liver, bone metastases. 1.2 cm LUL nodule   12/11/2022 -  Chemotherapy   Patient is on Treatment Plan : MELANOMA Nivolumab (1) + Ipilimumab (3) q21d / Nivolumab (480) q28d       MEDICAL HISTORY:  Past Medical History:  Diagnosis Date   Anxiety    Arthritis    Complication of anesthesia    Fibromyalgia    Gallstones 04/20/2018   GERD (gastroesophageal reflux disease)    History of blood transfusion    post Hysterectomy    Hypercholesteremia    Melanoma (HCC)    on back and excised   Migraine    PONV (postoperative nausea and vomiting)    Tobacco abuse    Vertebral artery disease (HCC)    history of left vertebral artery dissection 2011    SURGICAL HISTORY: Past Surgical History:  Procedure Laterality Date   ABDOMINAL HYSTERECTOMY     ANTERIOR FUSION CERVICAL SPINE  2008   BONE GRAFT HIP ILIAC CREST Left    Left Hip to Left Wrist   CHOLECYSTECTOMY N/A 04/20/2018   Procedure: LAPAROSCOPIC CHOLECYSTECTOMY;  Surgeon: Claud Kelp, MD;  Location: [facility] OR;  Service: General;  Laterality: N/A;   thumb surgery Left 2018   [facility]- Tendons Carpal tunnel   WRIST SURGERY  1994    SOCIAL HISTORY: Social History   Socioeconomic History   Marital status: Married    Spouse name: Not on file   Number of children: Not on file   Years of education: Not on file   Highest education level: Not on file  Occupational History   Not on file  Tobacco Use   Smoking status: Former    Current packs/day: 0.00    Average packs/day: 1 pack/day for 20.0 years (20.0 ttl pk-yrs)    Types: Cigarettes    Start date: 42    Quit date: 2018    Years since quitting: 6.8   Smokeless tobacco: Never  Vaping Use   Vaping status: Every Day   Substances: Nicotine  Substance and Sexual Activity   Alcohol use: No    Alcohol/week: 0.0 standard drinks of alcohol   Drug use: No   Sexual activity: Not on file  Other Topics Concern   Not on file  Social History Narrative   Married,  3 kids.   Lives home with husband,  Works at [employer].  Education 12th grade.  Caffeine pepsi daily and tea daily.   '   Social Determinants of Health   Financial Resource Strain: Not on file  Food Insecurity: Not on file  Transportation Needs: Not on file  Physical Activity: Not on file  Stress: Not on file  Social Connections: Not on file  Intimate Partner Violence: Not on file    FAMILY HISTORY: Family History  Problem Relation Age of  Onset   Lung cancer Father        smoking hx   Breast cancer Sister        diagnosed over the age of 59   Breast cancer Sister        diagnosed over the age of 36    ALLERGIES:  is allergic to aspirin.  MEDICATIONS:  Current Outpatient Medications  Medication Sig Dispense Refill   AMBIEN CR 6.25 MG CR tablet 1 tablet at bedtime as needed Orally Once a day for 90 days     CRESTOR 10 MG tablet 1 tablet Orally Once a day for 90 days     ondansetron (ZOFRAN) 8 MG tablet Take 1 tablet (8 mg total) by mouth every 8 (eight) hours as needed. 30 tablet 0   Oxycodone HCl 10 MG TABS Take 1 tablet (10 mg total) by mouth every 6 (six) hours as needed. 30 tablet 0   pregabalin (LYRICA) 75 MG capsule 1 capsule Orally twice a day for 90 days     promethazine (PHENERGAN) 25 MG tablet Take 25 mg by mouth every 6 (six) hours as needed for nausea or vomiting.     No current facility-administered medications for this visit.    REVIEW OF SYSTEMS:   Constitutional: Denies fevers, weight loss or abnormal night sweats Eyes: Denies blurriness of vision, double vision Mouth: Denies mucositis or sore throat Respiratory: Denies cough, shortness of breath or wheezes Cardiovascular: Denies palpitation, chest discomfort or lower extremity swelling Gastrointestinal:  Denies nausea, vomiting, abdominal pain, diarrhea, constipation or change in bowel habits GU: Denies any dysuria, hematuria, hesitancy Skin: Denies abnormal skin rashes Lymphatics: Denies new lymphadenopathy or easy bruising Neurological: Denies numbness, tingling or new weaknesses Behavioral/Psych: Mood is stable, no new changes  All other systems were reviewed with the patient and are negative.  PHYSICAL EXAMINATION: ECOG PERFORMANCE STATUS: 2 - Symptomatic, <50% confined to bed  Vitals:   12/07/22 1531  BP: 124/83  Pulse: (!) 120  Resp: 18  Temp: 97.8 F (36.6 C)   Filed Weights   12/07/22 1531  Weight: 134 lb 6.4 oz (61 kg)     GENERAL: alert, no distress and comfortable SKIN: skin color is normal, no jaundice, rashes or significant lesions EYES: sclera clear OROPHARYNX: no exudate, no erythema NECK: supple LYMPH:  no palpable lymphadenopathy in the cervical, axillary regions LUNGS: Effort normal, no respiratory distress.  Clear to auscultation bilaterally HEART: regular rate & rhythm and no lower extremity edema ABDOMEN: soft, non-tender and nondistended Musculoskeletal: no point tenderness NEURO: right lower leg weakness from pain  LABORATORY DATA:  I have reviewed the data as listed Lab Results  Component Value Date   WBC 7.3 11/25/2022   HGB 13.8 11/25/2022   HCT 39.5 11/25/2022   MCV 92.9 11/25/2022   PLT 344 11/25/2022   Recent Labs    11/16/22 1042 11/18/22 1524 11/25/22 0930  NA 140 139 135  K 4.0 3.6 3.6  CL 105 100 100  CO2 31 33* 26  GLUCOSE 120* 95 125*  BUN 7 8 6   CREATININE 0.92 0.94 1.00  CALCIUM 10.2 10.4* 9.5  GFRNONAA >60 >60 >60  PROT 7.3 8.0 7.5  ALBUMIN 4.5 4.7 4.4  AST 33 156* 41  ALT 23 206* 38  ALKPHOS 99 201* 132*  BILITOT 0.5 0.6 0.6    RADIOGRAPHIC STUDIES: I have personally reviewed the radiological images as listed and agreed with the findings in the report. US BIOPSY (LIVER)  Result Date: 11/25/2022 INDICATION: abnormal CT scan 11/16/2022 concerning for metastatic disease.; Multiple liver lesions EXAM: Ultrasound-guided core needle biopsy of focal liver lesion MEDICATIONS: None. ANESTHESIA/SEDATION: Moderate (conscious) sedation was employed during this procedure. A total of Versed 2 mg and Fentanyl 100 mcg was administered intravenously by the radiology nurse. Total intra-service moderate Sedation Time: 13 minutes. The patient's level of consciousness and vital signs were monitored continuously by radiology nursing throughout the procedure under my direct supervision. COMPLICATIONS: None immediate. PROCEDURE: Informed written consent was obtained from the  patient after a thorough discussion of the procedural risks, benefits and alternatives. All questions were addressed. Maximal Sterile Barrier Technique was utilized including caps, mask, sterile gowns, sterile gloves, sterile drape, hand hygiene and skin antiseptic. A timeout was performed prior to the initiation of the procedure. The patient was placed supine on the exam table. Ultrasound of the liver demonstrated multiple focal lesions, the largest of which appeared in the left hepatic lobe, estimated approximately 4 cm in size. Skin entry site was marked, and the overlying skin was prepped draped in the standard sterile fashion. Local analgesia was obtained with 1% lidocaine. Using ultrasound guidance, a 17 gauge introducer needle was advanced towards the identified lesion in the left hepatic lobe. Subsequently, core needle biopsy was performed of the left hepatic lobe lesion using an 18 gauge core biopsy device x5 total passes. Specimens were submitted in formalin to pathology for further handling. Limited postprocedure imaging demonstrated no hematoma. A clean dressing was placed after manual hemostasis. The patient tolerated the procedure well without immediate complication. IMPRESSION: Successful ultrasound-guided core needle biopsy of focal lesion in the left hepatic lobe. Electronically Signed   By: Olive Bass M.D.   On: 11/25/2022 15:33   CT Angio Chest/Abd/Pel for Dissection W and/or Wo Contrast  Result Date: 11/16/2022 CLINICAL DATA:  Acute aortic syndrome, low back pain, chest pain, left arm pain EXAM: CT ANGIOGRAPHY CHEST, ABDOMEN AND PELVIS TECHNIQUE: Non-contrast CT of the chest was initially obtained. Multidetector CT imaging through the chest, abdomen and pelvis was performed using the standard protocol during bolus administration of intravenous contrast. Multiplanar reconstructed images and MIPs were obtained and reviewed to evaluate the vascular anatomy. RADIATION DOSE REDUCTION: This  exam was performed according to the departmental dose-optimization program which includes automated exposure control, adjustment of the mA and/or kV according to patient size and/or use of iterative reconstruction technique. CONTRAST:  OMNIPAQUE IOHEXOL 350 MG/ML SOLN COMPARISON:  03/25/2018 FINDINGS: CTA CHEST FINDINGS Cardiovascular: SVC patent. Heart size normal. Trace pericardial fluid. The RV is nondilated. Satisfactory opacification of pulmonary arteries noted, and there is no evidence of pulmonary emboli. Adequate contrast opacification of the thoracic aorta with no evidence of dissection, aneurysm, or stenosis. There is classic 3-vessel brachiocephalic arch anatomy without proximal stenosis. Minimal nonocclusive atheromatous aortic plaque. Mediastinum/Nodes: No mediastinal hematoma, mass or adenopathy. Lungs/Pleura: No pleural effusion. No pneumothorax. 1.2 cm somewhat lobular left upper lobe nodule (Im29,Se7) . lungs otherwise clear. Musculoskeletal: Cervical fixation hardware. 1 cm lytic lesion in T4 vertebral body centered to the left of midline, with erosion of the posterior cortex. 0.9 cm lytic lesion in the T12 vertebral body anterior to the right pedicle. Review of the MIP images confirms the above findings. CTA ABDOMEN AND PELVIS FINDINGS VASCULAR Aorta: Moderate partially calcified atheromatous plaque. No aneurysm, dissection, or stenosis. Celiac: Patent without evidence of aneurysm, dissection, vasculitis or significant stenosis. SMA: Patent without evidence of aneurysm, dissection, vasculitis or significant stenosis. Renals: Both renal arteries are patent without evidence of aneurysm, dissection, vasculitis, fibromuscular dysplasia or significant stenosis. IMA: Patent without evidence of aneurysm, dissection, vasculitis or significant stenosis. Inflow: Minimal nonocclusive plaque. No aneurysm or dissection. Visualized proximal outflow mildly atheromatous, patent. Veins: No obvious venous  abnormality within the limitations of this arterial phase study. Review of the MIP images confirms the above findings. NON-VASCULAR Hepatobiliary: Multiple ill-defined liver lesions e.g. 1.2 cm low-attenuation lesion (Im128,Se5) . cholecystectomy clips. No biliary ductal dilatation. Pancreas: Unremarkable. No pancreatic ductal dilatation or surrounding inflammatory changes. Spleen: Normal in size without focal abnormality. Adrenals/Urinary Tract: Adrenal glands are unremarkable. Kidneys are normal, without renal calculi, focal lesion, or hydronephrosis. Bladder is unremarkable. Stomach/Bowel: Stomach is within normal limits. Appendix appears normal. No evidence of bowel wall thickening, distention, or inflammatory changes. Lymphatic: No abdominal or pelvic adenopathy. Reproductive: Status post hysterectomy. No adnexal masses. Other: No ascites.  Pelvic phleboliths.  No free air. Musculoskeletal: Poorly marginated lytic lesion in the posterior aspect of the L5 vertebral body, with pathologic fracture of the superior endplate. Additional smaller ill-defined lytic lesions in L1 and L4 vertebral bodies. Mild bilateral hip DJD. Review of the MIP images confirms the above findings. IMPRESSION: 1. Negative for aortic dissection or aneurysm. 2. 1.2 cm left upper lobe nodule.  For 3. Multiple ill-defined liver lesions, suspicious for metastatic disease. 4. Multiple lytic osseous lesions, suspicious for metastatic disease. Pathologic fracture of the superior endplate of L5. Electronically Signed   By: Corlis Leak M.D.   On: 11/16/2022 16:38   DG Chest Port 1 View  Result Date: 11/16/2022 CLINICAL DATA:  Increased chest pain today. EXAM: PORTABLE CHEST 1 VIEW COMPARISON:  None Available. FINDINGS: No consolidation, pneumothorax or effusion. No edema. Normal cardiopericardial silhouette. Overlapping cardiac leads. Surgical changes along the lower cervical spine at the edge of the imaging field.  There is focal nodular  density overlying the left upper lobe measuring 11 mm. This overlies the posterior aspect of the left sixth rib. Surgical clips in the right upper quadrant. IMPRESSION: 11 mm left upper lobe lung nodule. Please correlate with any prior dedicated chest CT with contrast to further delineate when appropriate Electronically Signed   By: Karen Kays M.D.   On: 11/16/2022 12:37   CT HEAD WO CONTRAST ( )  Result Date: 11/12/2022 CLINICAL DATA:  Provided history: Headache, unspecified type. Additional history provided by the scanning technologist: The patient reports left-sided facial numbness. EXAM: CT HEAD WITHOUT CONTRAST TECHNIQUE: Contiguous axial images were obtained from the base of the skull through the vertex without intravenous contrast. RADIATION DOSE REDUCTION: This exam was performed according to the departmental dose-optimization program which includes automated exposure control, adjustment of the mA and/or kV according to patient size and/or use of iterative reconstruction technique. COMPARISON:  Head CT 02/22/2014. FINDINGS: Brain: Cerebral volume is normal. There is no acute intracranial hemorrhage. No demarcated cortical infarct. No extra-axial fluid collection. No evidence of an intracranial mass. No midline shift. Vascular: No hyperdense vessel. Atherosclerotic calcifications within the intracranial internal carotid arteries. Skull: No calvarial fracture or aggressive osseous lesion. Sinuses/Orbits: No mass or acute finding within the imaged orbits. 2 cm mucous retention cyst or polyp within the right maxillary sinus. Small-volume frothy secretions within a posterior right ethmoid air cell. IMPRESSION: 1. Unremarkable non-contrast CT appearance of the brain. No evidence of an acute intracranial abnormality. 2. Atherosclerotic calcifications within the intracranial internal carotid arteries. 3. Paranasal sinus disease as described. Electronically Signed   By: Jackey Loge D.O.   On: 11/12/2022  20:48

## 2022-12-05 ENCOUNTER — Ambulatory Visit (HOSPITAL_COMMUNITY): Payer: BC Managed Care – PPO

## 2022-12-05 ENCOUNTER — Encounter (HOSPITAL_COMMUNITY): Payer: Self-pay

## 2022-12-05 ENCOUNTER — Ambulatory Visit (HOSPITAL_COMMUNITY)
Admission: RE | Admit: 2022-12-05 | Discharge: 2022-12-05 | Disposition: A | Discharge status: Home / Self Care | Payer: BC Managed Care – PPO | Source: Ambulatory Visit

## 2022-12-05 ENCOUNTER — Ambulatory Visit (HOSPITAL_COMMUNITY)
Admission: RE | Admit: 2022-12-05 | Discharge: 2022-12-05 | Disposition: A | Discharge status: Home / Self Care | Payer: BC Managed Care – PPO | Source: Ambulatory Visit | Attending: Family Medicine | Admitting: Family Medicine

## 2022-12-05 DIAGNOSIS — R200 Anesthesia of skin: Secondary | ICD-10-CM

## 2022-12-05 DIAGNOSIS — C438 Malignant melanoma of overlapping sites of skin: Secondary | ICD-10-CM

## 2022-12-07 ENCOUNTER — Inpatient Hospital Stay: Payer: BC Managed Care – PPO | Attending: Physician Assistant

## 2022-12-07 ENCOUNTER — Ambulatory Visit (HOSPITAL_BASED_OUTPATIENT_CLINIC_OR_DEPARTMENT_OTHER): Payer: BC Managed Care – PPO | Admitting: Student

## 2022-12-07 ENCOUNTER — Inpatient Hospital Stay: Payer: BC Managed Care – PPO

## 2022-12-07 ENCOUNTER — Encounter (HOSPITAL_BASED_OUTPATIENT_CLINIC_OR_DEPARTMENT_OTHER): Payer: Self-pay | Admitting: Student

## 2022-12-07 VITALS — BP 124/83 | HR 120 | Temp 97.8°F | Resp 18 | Wt 134.4 lb

## 2022-12-07 DIAGNOSIS — M797 Fibromyalgia: Secondary | ICD-10-CM | POA: Diagnosis not present

## 2022-12-07 DIAGNOSIS — Z5112 Encounter for antineoplastic immunotherapy: Secondary | ICD-10-CM | POA: Diagnosis not present

## 2022-12-07 DIAGNOSIS — Z79620 Long term (current) use of immunosuppressive biologic: Secondary | ICD-10-CM | POA: Insufficient documentation

## 2022-12-07 DIAGNOSIS — E7800 Pure hypercholesterolemia, unspecified: Secondary | ICD-10-CM | POA: Insufficient documentation

## 2022-12-07 DIAGNOSIS — K219 Gastro-esophageal reflux disease without esophagitis: Secondary | ICD-10-CM | POA: Insufficient documentation

## 2022-12-07 DIAGNOSIS — C439 Malignant melanoma of skin, unspecified: Secondary | ICD-10-CM | POA: Diagnosis not present

## 2022-12-07 DIAGNOSIS — Z510 Encounter for antineoplastic radiation therapy: Secondary | ICD-10-CM | POA: Diagnosis not present

## 2022-12-07 DIAGNOSIS — C7951 Secondary malignant neoplasm of bone: Secondary | ICD-10-CM | POA: Insufficient documentation

## 2022-12-07 DIAGNOSIS — C771 Secondary and unspecified malignant neoplasm of intrathoracic lymph nodes: Secondary | ICD-10-CM | POA: Insufficient documentation

## 2022-12-07 DIAGNOSIS — C7802 Secondary malignant neoplasm of left lung: Secondary | ICD-10-CM | POA: Diagnosis not present

## 2022-12-07 DIAGNOSIS — C4359 Malignant melanoma of other part of trunk: Secondary | ICD-10-CM | POA: Insufficient documentation

## 2022-12-07 DIAGNOSIS — I10 Essential (primary) hypertension: Secondary | ICD-10-CM | POA: Insufficient documentation

## 2022-12-07 DIAGNOSIS — M8448XA Pathological fracture, other site, initial encounter for fracture: Secondary | ICD-10-CM | POA: Diagnosis not present

## 2022-12-07 DIAGNOSIS — C7931 Secondary malignant neoplasm of brain: Secondary | ICD-10-CM | POA: Diagnosis not present

## 2022-12-07 DIAGNOSIS — K5900 Constipation, unspecified: Secondary | ICD-10-CM | POA: Diagnosis not present

## 2022-12-07 DIAGNOSIS — Z79899 Other long term (current) drug therapy: Secondary | ICD-10-CM | POA: Insufficient documentation

## 2022-12-07 DIAGNOSIS — Z801 Family history of malignant neoplasm of trachea, bronchus and lung: Secondary | ICD-10-CM | POA: Diagnosis not present

## 2022-12-07 DIAGNOSIS — G893 Neoplasm related pain (acute) (chronic): Secondary | ICD-10-CM | POA: Insufficient documentation

## 2022-12-07 DIAGNOSIS — N289 Disorder of kidney and ureter, unspecified: Secondary | ICD-10-CM | POA: Diagnosis not present

## 2022-12-07 DIAGNOSIS — K5903 Drug induced constipation: Secondary | ICD-10-CM

## 2022-12-07 DIAGNOSIS — C787 Secondary malignant neoplasm of liver and intrahepatic bile duct: Secondary | ICD-10-CM | POA: Insufficient documentation

## 2022-12-07 DIAGNOSIS — Z803 Family history of malignant neoplasm of breast: Secondary | ICD-10-CM | POA: Diagnosis not present

## 2022-12-07 DIAGNOSIS — Z87891 Personal history of nicotine dependence: Secondary | ICD-10-CM | POA: Diagnosis not present

## 2022-12-07 NOTE — Progress Notes (Signed)
Chief Complaint: Low back pain     History of Present Illness:    Laura [name] is a 57 y.o. female presenting today for evaluation of low back pain.  She was seen in the ED on 11/16/2022 for acute LBP and workup at that time unfortunately revealed existing metastatic cancer.  She has since been seen by oncology, and this seems to be melanoma that has spread to her liver, lungs, and possibly lung.  Previous CT scan did reveal a pathologic fracture of the L5 vertebrae.  Patient states that she has severe pain that is radiating down both legs.  This has been more notable down the right leg although just began recently on the left.  She does have numbness in both legs down to the feet.  She is taking oxycodone 10 mg approximately every 8 hours but states that her pain is not controlled.  Has also been using heating pad.  Her pain is worse particularly when in a sitting position.  She did have MRIs ordered of the lumbar spine and brain, however was not able to proceed with these due to claustrophobia and inability to remain still due to her pain.   Surgical History:   None  PMH/PSH/Family History/Social History/Meds/Allergies:    Past Medical History:  Diagnosis Date   Anxiety    Arthritis    Complication of anesthesia    Fibromyalgia    Gallstones 04/20/2018   GERD (gastroesophageal reflux disease)    History of blood transfusion    post Hysterectomy   Hypercholesteremia    Melanoma (HCC)    on back and excised   Migraine    PONV (postoperative nausea and vomiting)    Tobacco abuse    Vertebral artery disease (HCC)    history of left vertebral artery dissection 2011   Past Surgical History:  Procedure Laterality Date   ABDOMINAL HYSTERECTOMY     ANTERIOR FUSION CERVICAL SPINE  2008   BONE GRAFT HIP ILIAC CREST Left    Left Hip to Left Wrist   CHOLECYSTECTOMY N/A 04/20/2018   Procedure: LAPAROSCOPIC CHOLECYSTECTOMY;  Surgeon: Claud Kelp, MD;  Location: [facility] OR;  Service: General;  Laterality: N/A;   thumb surgery Left 2018   [facility]- Tendons Carpal tunnel   WRIST SURGERY  1994   Social History   Socioeconomic History   Marital status: Married    Spouse name: Not on file   Number of children: Not on file   Years of education: Not on file   Highest education level: Not on file  Occupational History   Not on file  Tobacco Use   Smoking status: Former    Current packs/day: 0.00    Average packs/day: 1 pack/day for 20.0 years (20.0 ttl pk-yrs)    Types: Cigarettes    Start date: 7    Quit date: 2018    Years since quitting: 6.8   Smokeless tobacco: Never  Vaping Use   Vaping status: Every Day   Substances: Nicotine  Substance and Sexual Activity   Alcohol use: No    Alcohol/week: 0.0 standard drinks of alcohol   Drug use: No   Sexual activity: Not on file  Other Topics Concern   Not on file  Social History Narrative   Married,  3 kids.   Lives  home with husband,  Works at [employer].  Education 12th grade.  Caffeine pepsi daily and tea daily.   '   Social Determinants of Health   Financial Resource Strain: Not on file  Food Insecurity: Not on file  Transportation Needs: Not on file  Physical Activity: Not on file  Stress: Not on file  Social Connections: Not on file   Family History  Problem Relation Age of Onset   Lung cancer Father        smoking hx   Breast cancer Sister        diagnosed over the age of 102   Breast cancer Sister        diagnosed over the age of 84   Allergies  Allergen Reactions   Aspirin Swelling    SWELLING REACTION UNSPECIFIED    Current Outpatient Medications  Medication Sig Dispense Refill   AMBIEN CR 6.25 MG CR tablet 1 tablet at bedtime as needed Orally Once a day for 90 days     busPIRone (BUSPAR) 15 MG tablet 1 tablet Orally Twice a day for 30 days     CRESTOR 10 MG tablet 1 tablet Orally Once a day for 90 days     dicyclomine (BENTYL) 10 MG capsule 1  capsule 30 minutes before eating Orally Three times a day for 90 days     DULoxetine (CYMBALTA) 30 MG capsule 1 capsule Orally Once a day for 90 days     ondansetron (ZOFRAN) 8 MG tablet Take 1 tablet (8 mg total) by mouth every 8 (eight) hours as needed. 30 tablet 0   Oxycodone HCl 10 MG TABS Take 1 tablet (10 mg total) by mouth every 6 (six) hours as needed. 30 tablet 0   pregabalin (LYRICA) 75 MG capsule 1 capsule Orally twice a day for 90 days     promethazine (PHENERGAN) 25 MG tablet Take 25 mg by mouth every 6 (six) hours as needed for nausea or vomiting. (Patient not taking: Reported on 11/18/2022)     propranolol (INDERAL) 20 MG tablet 1 tablet Orally Twice a day for 30 days (Patient not taking: Reported on 11/25/2022)     No current facility-administered medications for this visit.   No results found.  Review of Systems:   A ROS was performed including pertinent positives and negatives as documented in the HPI.  Physical Exam :   Constitutional: NAD and appears stated age Neurological: Alert and oriented Psych: Appropriate affect and cooperative There were no vitals taken for this visit.   Comprehensive Musculoskeletal Exam:    No tenderness to palpation directly over the lumbar spine or paraspinal muscles.  Passive hip range of motion bilaterally to 120 degrees flexion, 30 degrees external rotation, and 20 degrees internal rotation.  Pain in the lumbar spine is elicited with right hip flexion and internal rotation.  Right knee flexion strength is 3/5 and 5/5 with extension.  Full and equal strength bilaterally with ankle plantarflexion and dorsiflexion.  Imaging:   CT (chest, abdomen, pelvis): Fracture noted of the L5 superior endplate.  Lytic lesions of the L1 and L4 vertebral bodies.    I personally reviewed and interpreted the radiographs.   Assessment:   57 y.o. female with a pathologic fracture of the L5 superior endplate secondary to metastatic disease.  Patient is  following up with oncology today to discuss further treatment plan.  She is going to reschedule both MRIs and states that oncology is going to provide Ativan for relaxation.  Her low back is giving her so much pain that she expresses desire to have this addressed even before proceeding with cancer treatments.  Pain is particularly excruciating while seated.  I will plan to refer her to see our spine surgeon Dr. Christell Constant shortly after MRIs are completed for discussion of possible interventions.  Patient is agreeable to plan.  Plan :    -Obtain MRI of the lumbar spine and referral to Dr. Christell Constant for further treatment discussion    I personally saw and evaluated the patient, and participated in the management and treatment plan.  Hazle Nordmann, [name] Orthopedics

## 2022-12-07 NOTE — Progress Notes (Signed)
START ON PATHWAY REGIMEN - Melanoma and Other Skin Cancers     Cycles 1 through 4: A cycle is every 21 days:     Nivolumab      Ipilimumab    Cycles 5 and beyond: A cycle is every 28 days:     Nivolumab   **Always confirm dose/schedule in your pharmacy ordering system**  Patient Characteristics: Melanoma, Cutaneous/Unknown Primary, Distant Metastases, Unresectable, No Brain Metastases, First Line, BRAF V600 Mutation Positive, Immunotherapy Preferred Disease Classification: Melanoma Disease Subtype: Cutaneous BRAF V600 Mutation Status: BRAF V600 Activating Mutation Positive Therapeutic Status: Distant Metastases Line of Therapy: First Line Intent of Therapy: Non-Curative / Palliative Intent, Discussed with Patient 

## 2022-12-07 NOTE — Assessment & Plan Note (Addendum)
Will start ipi/nivo Follow up in 3 weeks Baseline labs today MRI brain pending Request BRAF status

## 2022-12-07 NOTE — Assessment & Plan Note (Signed)
She saw ortho today and had been referred to IR for kyphoplasty. If not suitable will refer for palliative sbrt

## 2022-12-07 NOTE — Assessment & Plan Note (Signed)
Add benefiber, Miralax as needed and soft softener.

## 2022-12-08 ENCOUNTER — Other Ambulatory Visit: Payer: Self-pay

## 2022-12-08 ENCOUNTER — Encounter (HOSPITAL_BASED_OUTPATIENT_CLINIC_OR_DEPARTMENT_OTHER): Payer: Self-pay

## 2022-12-08 ENCOUNTER — Telehealth: Payer: Self-pay

## 2022-12-08 MED ORDER — LORAZEPAM 0.5 MG PO TABS: ORAL_TABLET | ORAL | 0 refills | Status: DC

## 2022-12-08 NOTE — Telephone Encounter (Signed)
TC to [facility] at [phone] to request add-on of PD-L2 test to 11/25/22 pathology sample per Dr. Cherly Hensen. Receptionist states she is adding this test on.

## 2022-12-08 NOTE — Telephone Encounter (Signed)
[facility] pharmacy calls due to Dr. Nelta Numbers recent order of lorazepam when pt is currently taking oxycodone. Confirmed that one-time dose of lorazepam prior to procedure is okay with Dr. Cherly Hensen. [facility] pharmacist notified.

## 2022-12-08 NOTE — Progress Notes (Signed)
Take one tab of Ativan 0.5 mg 30 minutes before PET and 2 tabs (1.mg) before MRI.

## 2022-12-09 ENCOUNTER — Encounter (HOSPITAL_COMMUNITY)
Admission: RE | Admit: 2022-12-09 | Discharge: 2022-12-09 | Disposition: A | Discharge status: Home / Self Care | Payer: BC Managed Care – PPO | Source: Ambulatory Visit

## 2022-12-09 ENCOUNTER — Other Ambulatory Visit (HOSPITAL_COMMUNITY): Payer: Self-pay

## 2022-12-09 ENCOUNTER — Inpatient Hospital Stay: Payer: BC Managed Care – PPO

## 2022-12-09 DIAGNOSIS — C771 Secondary and unspecified malignant neoplasm of intrathoracic lymph nodes: Secondary | ICD-10-CM | POA: Diagnosis not present

## 2022-12-09 DIAGNOSIS — C7931 Secondary malignant neoplasm of brain: Secondary | ICD-10-CM | POA: Diagnosis not present

## 2022-12-09 DIAGNOSIS — Z79620 Long term (current) use of immunosuppressive biologic: Secondary | ICD-10-CM | POA: Diagnosis not present

## 2022-12-09 DIAGNOSIS — G893 Neoplasm related pain (acute) (chronic): Secondary | ICD-10-CM

## 2022-12-09 DIAGNOSIS — M797 Fibromyalgia: Secondary | ICD-10-CM | POA: Diagnosis not present

## 2022-12-09 DIAGNOSIS — C438 Malignant melanoma of overlapping sites of skin: Secondary | ICD-10-CM | POA: Insufficient documentation

## 2022-12-09 DIAGNOSIS — Z801 Family history of malignant neoplasm of trachea, bronchus and lung: Secondary | ICD-10-CM | POA: Diagnosis not present

## 2022-12-09 DIAGNOSIS — C4359 Malignant melanoma of other part of trunk: Secondary | ICD-10-CM | POA: Diagnosis not present

## 2022-12-09 DIAGNOSIS — Z803 Family history of malignant neoplasm of breast: Secondary | ICD-10-CM | POA: Diagnosis not present

## 2022-12-09 DIAGNOSIS — K5900 Constipation, unspecified: Secondary | ICD-10-CM | POA: Diagnosis not present

## 2022-12-09 DIAGNOSIS — C7951 Secondary malignant neoplasm of bone: Secondary | ICD-10-CM | POA: Diagnosis not present

## 2022-12-09 DIAGNOSIS — C439 Malignant melanoma of skin, unspecified: Secondary | ICD-10-CM

## 2022-12-09 DIAGNOSIS — N289 Disorder of kidney and ureter, unspecified: Secondary | ICD-10-CM | POA: Diagnosis not present

## 2022-12-09 DIAGNOSIS — K219 Gastro-esophageal reflux disease without esophagitis: Secondary | ICD-10-CM | POA: Diagnosis not present

## 2022-12-09 DIAGNOSIS — E7800 Pure hypercholesterolemia, unspecified: Secondary | ICD-10-CM | POA: Diagnosis not present

## 2022-12-09 DIAGNOSIS — Z510 Encounter for antineoplastic radiation therapy: Secondary | ICD-10-CM | POA: Diagnosis not present

## 2022-12-09 DIAGNOSIS — Z5112 Encounter for antineoplastic immunotherapy: Secondary | ICD-10-CM | POA: Diagnosis not present

## 2022-12-09 DIAGNOSIS — C7802 Secondary malignant neoplasm of left lung: Secondary | ICD-10-CM | POA: Diagnosis not present

## 2022-12-09 DIAGNOSIS — Z79899 Other long term (current) drug therapy: Secondary | ICD-10-CM | POA: Diagnosis not present

## 2022-12-09 DIAGNOSIS — Z87891 Personal history of nicotine dependence: Secondary | ICD-10-CM | POA: Diagnosis not present

## 2022-12-09 DIAGNOSIS — I10 Essential (primary) hypertension: Secondary | ICD-10-CM | POA: Diagnosis not present

## 2022-12-09 DIAGNOSIS — C787 Secondary malignant neoplasm of liver and intrahepatic bile duct: Secondary | ICD-10-CM | POA: Diagnosis not present

## 2022-12-09 LAB — CMP (CANCER CENTER ONLY)
ALT: 26 U/L (ref 0–44)
AST: 63 U/L — ABNORMAL HIGH (ref 15–41)
Albumin: 4.3 g/dL (ref 3.5–5.0)
Alkaline Phosphatase: 158 U/L — ABNORMAL HIGH (ref 38–126)
Anion gap: 8 (ref 5–15)
BUN: 14 mg/dL (ref 6–20)
CO2: 34 mmol/L — ABNORMAL HIGH (ref 22–32)
Calcium: 10.7 mg/dL — ABNORMAL HIGH (ref 8.9–10.3)
Chloride: 94 mmol/L — ABNORMAL LOW (ref 98–111)
Creatinine: 1.26 mg/dL — ABNORMAL HIGH (ref 0.44–1.00)
GFR, Estimated: 51 mL/min — ABNORMAL LOW (ref >60)
Glucose, Bld: 99 mg/dL (ref 70–99)
Potassium: 3.8 mmol/L (ref 3.5–5.1)
Sodium: 136 mmol/L (ref 135–145)
Total Bilirubin: 1.2 mg/dL — ABNORMAL HIGH (ref <1.2)
Total Protein: 7.7 g/dL (ref 6.5–8.1)

## 2022-12-09 LAB — CBC WITH DIFFERENTIAL (CANCER CENTER ONLY)
Abs Immature Granulocytes: 0.07 10*3/uL (ref 0.00–0.07)
Basophils Absolute: 0.1 10*3/uL (ref 0.0–0.1)
Basophils Relative: 1 %
Eosinophils Absolute: 0 10*3/uL (ref 0.0–0.5)
Eosinophils Relative: 0 %
HCT: 39.7 % (ref 36.0–46.0)
Hemoglobin: 13.8 g/dL (ref 12.0–15.0)
Immature Granulocytes: 1 %
Lymphocytes Relative: 14 %
Lymphs Abs: 1.5 10*3/uL (ref 0.7–4.0)
MCH: 32 pg (ref 26.0–34.0)
MCHC: 34.8 g/dL (ref 30.0–36.0)
MCV: 92.1 fL (ref 80.0–100.0)
Monocytes Absolute: 0.5 10*3/uL (ref 0.1–1.0)
Monocytes Relative: 5 %
Neutro Abs: 8.2 10*3/uL — ABNORMAL HIGH (ref 1.7–7.7)
Neutrophils Relative %: 79 %
Platelet Count: 322 10*3/uL (ref 150–400)
RBC: 4.31 MIL/uL (ref 3.87–5.11)
RDW: 12.4 % (ref 11.5–15.5)
WBC Count: 10.3 10*3/uL (ref 4.0–10.5)
nRBC: 0 % (ref 0.0–0.2)

## 2022-12-09 LAB — GLUCOSE, CAPILLARY: Glucose-Capillary: 115 mg/dL — ABNORMAL HIGH (ref 70–99)

## 2022-12-09 MED ORDER — ACETAMINOPHEN 325 MG PO TABS
325.0000 mg | ORAL_TABLET | Freq: Once | ORAL | Status: AC
Start: 2022-12-09 — End: 2022-12-09
  Administered 2022-12-09: 325 mg via ORAL
  Filled 2022-12-09: qty 1

## 2022-12-09 MED ORDER — FLUDEOXYGLUCOSE F - 18 (FDG) INJECTION
6.7000 | Freq: Once | INTRAVENOUS | Status: AC
  Administered 2022-12-09: 6.64 via INTRAVENOUS

## 2022-12-09 MED ORDER — OXYCODONE HCL 5 MG PO TABS
5.0000 mg | ORAL_TABLET | Freq: Once | ORAL | Status: AC
  Administered 2022-12-09: 5 mg via ORAL
  Filled 2022-12-09: qty 1

## 2022-12-09 NOTE — Addendum Note (Signed)
Addended by: Jeanella Cara on: 12/09/2022 05:25 PM   Modules accepted: Orders

## 2022-12-10 ENCOUNTER — Other Ambulatory Visit: Payer: Self-pay

## 2022-12-10 ENCOUNTER — Telehealth: Payer: Self-pay

## 2022-12-10 LAB — T4: T4, Total: 13.5 ug/dL — ABNORMAL HIGH (ref 4.5–12.0)

## 2022-12-10 LAB — TSH: TSH: 0.925 u[IU]/mL (ref 0.350–4.500)

## 2022-12-10 NOTE — Progress Notes (Signed)
Patient Care Team: [name], Cordelia Poche as PCP - General (Family Medicine)  Clinic Day:  12/11/2022  Referring physician: Melven Sartorius, MD  ASSESSMENT & PLAN:   Assessment & Plan: Laura [name] is a 57 y.o.female with history of melanoma, hypertension, hyperlipidemia, GERD, arthritis being [facility] for stage IV melanoma.   Current diagnosis: Stage IV, pending brain MRI. Metastases to bone, liver. BRAF pending   Discussed findings from PET of extensive liver metastases, and bone metastases. She has a lot of pain from the disease burden. Proceed with dual ICIs is reasonable.  Melanoma of skin (HCC) Extensive metastases with large burden.  Will start ipi/nivo today MRI brain pending. Postpone due to need for sedation Request BRAF status Referral for palliative radiation for pain from bone metastases  Cancer associated pain She has referral for palliative radiation Dilaudid 2 mg Every 6 hours as needed Referral to palliative care  Renal insufficiency Increase fluid intake    Recommend increase fiber and vegetable, legumes.  The patient understands the plans discussed today and is in agreement with them.  She knows to contact our office if she develops concerns prior to her next appointment.  Melven Sartorius, MD  [facility] - A DEPT OF [facility] [address] [place] [address] Dept: [phone] Dept Fax: [phone]   Orders Placed This Encounter  Procedures   CMP (Cancer Center only)    Standing Status:   Future    Standing Expiration Date:   12/11/2023   Amb Referral to Palliative Care    Referral Priority:   Urgent    Referral Type:   Consultation    Referral Reason:   Symptom Managment    Number of Visits Requested:   1      CHIEF COMPLAINT:  CC: melanoma  Current Treatment:  ipi/nivo  INTERVAL HISTORY:  Laura [name] is here today for repeat clinical assessment.  She has a lot of pain on the lower back and hips. She had hard time sleeping. Oxycodone did not work. She was able to move her bowel with enema. No abdominal or chest pain today.  I have reviewed the past medical history, past surgical history, social history and family history with the patient and they are unchanged from previous note.  ALLERGIES:  is allergic to aspirin.  MEDICATIONS:  Current Outpatient Medications  Medication Sig Dispense Refill   HYDROmorphone (DILAUDID) 2 MG tablet Take 1 tablet (2 mg total) by mouth every 6 (six) hours as needed for severe pain (pain score 7-10). 30 tablet 0   AMBIEN CR 6.25 MG CR tablet 1 tablet at bedtime as needed Orally Once a day for 90 days     CRESTOR 10 MG tablet 1 tablet Orally Once a day for 90 days     LORazepam (ATIVAN) 0.5 MG tablet Take 1 tab 30 minutes before PET and 2 tabs before MRI 30 tablet 0   ondansetron (ZOFRAN) 8 MG tablet Take 1 tablet (8 mg total) by mouth every 8 (eight) hours as needed. 30 tablet 0   pregabalin (LYRICA) 75 MG capsule 1 capsule Orally twice a day for 90 days     promethazine (PHENERGAN) 25 MG tablet Take 25 mg by mouth every 6 (six) hours as needed for nausea or vomiting.     No current facility-administered medications for this visit.    HISTORY OF PRESENT ILLNESS:   Oncology History  Melanoma of  skin (HCC)  11/16/2022 Imaging   She presented to the emergency room on 11/16/2022 for back pain x 2 weeks and central chest pain for several months   CTA CAP IMPRESSION: 1. Negative for aortic dissection or aneurysm. 2. 1.2 cm left upper lobe nodule.  For 3. Multiple ill-defined liver lesions, suspicious for metastatic disease. 4. Multiple lytic osseous lesions, suspicious for metastatic disease. Pathologic fracture of the superior endplate of L5   11/25/2022 Pathology Results   1. Liver, needle/core biopsy, Left hepatic lobe lesion :       - METASTATIC MELANOMA.    12/04/2022 Initial Diagnosis    Melanoma of skin (HCC) Liver, bone metastases. 1.2 cm LUL nodule   12/09/2022 PET scan   PET: Diffuse bone and liver metastases.  Lung metastases.    12/11/2022 -  Chemotherapy   Patient is on Treatment Plan : MELANOMA Nivolumab (1) + Ipilimumab (3) q21d / Nivolumab (480) q28d         REVIEW OF SYSTEMS:   All relevant systems were reviewed with the patient and are negative.   VITALS:  Blood pressure 119/81, pulse (!) 122, temperature 97.7 F (36.5 C), temperature source Oral, resp. rate 17, height 5\' 3"  (1.6 m), weight 133 lb 8 oz (60.6 kg), SpO2 100%.  Wt Readings from Last 3 Encounters:  12/11/22 133 lb 8 oz (60.6 kg)  12/07/22 134 lb 6.4 oz (61 kg)  11/25/22 136 lb (61.7 kg)    Body mass index is 23.65 kg/m.  Performance status (ECOG): 2 - Symptomatic, <50% confined to bed  PHYSICAL EXAM:   GENERAL:alert, no distress and comfortable SKIN: skin color normal, no jaundice  LUNGS:   normal breathing effort.   ABDOMEN: abdomen soft, non-tender and nondistended Musculoskeletal: no edema NEURO: alert, sensation equal bilaterally.  LABORATORY DATA:  I have reviewed the data as listed    Component Value Date/Time   NA 136 12/09/2022 1528   K 3.8 12/09/2022 1528   CL 94 (L) 12/09/2022 1528   CO2 34 (H) 12/09/2022 1528   GLUCOSE 99 12/09/2022 1528   BUN 14 12/09/2022 1528   CREATININE 1.26 (H) 12/09/2022 1528   CALCIUM 10.7 (H) 12/09/2022 1528   PROT 7.7 12/09/2022 1528   ALBUMIN 4.3 12/09/2022 1528   AST 63 (H) 12/09/2022 1528   ALT 26 12/09/2022 1528   ALKPHOS 158 (H) 12/09/2022 1528   BILITOT 1.2 (H) 12/09/2022 1528   GFRNONAA 51 (L) 12/09/2022 1528   GFRAA >60 04/20/2018 0823    No results found for: "SPEP", "UPEP"  Lab Results  Component Value Date   WBC 10.3 12/09/2022   NEUTROABS 8.2 (H) 12/09/2022   HGB 13.8 12/09/2022   HCT 39.7 12/09/2022   MCV 92.1 12/09/2022   PLT 322 12/09/2022      Chemistry      Component Value Date/Time   NA 136  12/09/2022 1528   K 3.8 12/09/2022 1528   CL 94 (L) 12/09/2022 1528   CO2 34 (H) 12/09/2022 1528   BUN 14 12/09/2022 1528   CREATININE 1.26 (H) 12/09/2022 1528      Component Value Date/Time   CALCIUM 10.7 (H) 12/09/2022 1528   ALKPHOS 158 (H) 12/09/2022 1528   AST 63 (H) 12/09/2022 1528   ALT 26 12/09/2022 1528   BILITOT 1.2 (H) 12/09/2022 1528       RADIOGRAPHIC STUDIES: I have personally reviewed the radiological images as listed and agreed with the findings in the report. NM PET  Image Initial (PI) Whole Body  Result Date: 12/09/2022 CLINICAL DATA:  Initial treatment strategy for metastatic melanoma. EXAM: NUCLEAR MEDICINE PET WHOLE BODY TECHNIQUE: 6.6 mCi F-18 FDG was injected intravenously. Full-ring PET imaging was performed from the head to foot after the radiotracer. CT data was obtained and used for attenuation correction and anatomic localization. Fasting blood glucose: 115 mg/dl COMPARISON:  CTA chest abdomen pelvis dated 11/16/2022 FINDINGS: Mediastinal blood pool activity: SUV max 2.1 HEAD/NECK: No hypermetabolic activity in the scalp. No hypermetabolic cervical lymph nodes. Incidental CT findings: none CHEST: 14 mm posterior left upper lobe pulmonary nodule (series 7/image 20), max SUV 8.7. 11 mm short axis left AP window node (series 4/image 82), max SUV 11.2. Incidental CT findings: Mild atherosclerotic calcifications of the aortic arch. Mild coronary atherosclerosis of the LAD. Mild centrilobular and paraseptal emphysematous changes, upper lung predominant. ABDOMEN/PELVIS: Multifocal hepatic metastases, approximately 10-15 in number, including: --7.4 cm mass in the lateral segment left hepatic lobe (series 4/image 112), max SUV 16.3 --2.9 cm mass in the medial aspect of segment 6 (series 4/image 122), max SUV 14.7 No abnormal hypermetabolism in the pancreas, spleen, or adrenal glands. No hypermetabolic abdominopelvic lymphadenopathy. Incidental CT findings: Status post  cholecystectomy. Status post hysterectomy. Mild rectosigmoid colonic wall thickening (series 4/image 135), nonspecific. Atherosclerotic calcifications of the abdominal aorta and branch vessels. SKELETON: Multifocal/widespread osseous metastases, including: --Left humeral head, max SUV 6.4 --T4 vertebral body, max SUV 21.1 --Right lateral 6th rib, max SUV 4.2 --Right inferior sternum, max SUV 12.5 --L4 vertebral body, max SUV 16.3 --Left sacrum, max SUV 15.4 Hypermetabolism involving right anterior thigh musculature, favored to be physiologic. Incidental CT findings: none EXTREMITIES: No abnormal hypermetabolic activity in the lower extremities. Incidental CT findings: none IMPRESSION: Left upper lobe pulmonary metastasis, as above. Associated mediastinal nodal metastasis. Multifocal hepatic metastases, as above. Multifocal/widespread osseous metastases, as above. Electronically Signed   By: Charline Bills M.D.   On: 12/09/2022 23:34   US BIOPSY (LIVER)  Result Date: 11/25/2022 INDICATION: abnormal CT scan 11/16/2022 concerning for metastatic disease.; Multiple liver lesions EXAM: Ultrasound-guided core needle biopsy of focal liver lesion MEDICATIONS: None. ANESTHESIA/SEDATION: Moderate (conscious) sedation was employed during this procedure. A total of Versed 2 mg and Fentanyl 100 mcg was administered intravenously by the radiology nurse. Total intra-service moderate Sedation Time: 13 minutes. The patient's level of consciousness and vital signs were monitored continuously by radiology nursing throughout the procedure under my direct supervision. COMPLICATIONS: None immediate. PROCEDURE: Informed written consent was obtained from the patient after a thorough discussion of the procedural risks, benefits and alternatives. All questions were addressed. Maximal Sterile Barrier Technique was utilized including caps, mask, sterile gowns, sterile gloves, sterile drape, hand hygiene and skin antiseptic. A timeout  was performed prior to the initiation of the procedure. The patient was placed supine on the exam table. Ultrasound of the liver demonstrated multiple focal lesions, the largest of which appeared in the left hepatic lobe, estimated approximately 4 cm in size. Skin entry site was marked, and the overlying skin was prepped draped in the standard sterile fashion. Local analgesia was obtained with 1% lidocaine. Using ultrasound guidance, a 17 gauge introducer needle was advanced towards the identified lesion in the left hepatic lobe. Subsequently, core needle biopsy was performed of the left hepatic lobe lesion using an 18 gauge core biopsy device x5 total passes. Specimens were submitted in formalin to pathology for further handling. Limited postprocedure imaging demonstrated no hematoma. A clean dressing was placed after  manual hemostasis. The patient tolerated the procedure well without immediate complication. IMPRESSION: Successful ultrasound-guided core needle biopsy of focal lesion in the left hepatic lobe. Electronically Signed   By: Olive Bass M.D.   On: 11/25/2022 15:33   CT Angio Chest/Abd/Pel for Dissection W and/or Wo Contrast  Result Date: 11/16/2022 CLINICAL DATA:  Acute aortic syndrome, low back pain, chest pain, left arm pain EXAM: CT ANGIOGRAPHY CHEST, ABDOMEN AND PELVIS TECHNIQUE: Non-contrast CT of the chest was initially obtained. Multidetector CT imaging through the chest, abdomen and pelvis was performed using the standard protocol during bolus administration of intravenous contrast. Multiplanar reconstructed images and MIPs were obtained and reviewed to evaluate the vascular anatomy. RADIATION DOSE REDUCTION: This exam was performed according to the departmental dose-optimization program which includes automated exposure control, adjustment of the mA and/or kV according to patient size and/or use of iterative reconstruction technique. CONTRAST:  OMNIPAQUE IOHEXOL 350 MG/ML SOLN  COMPARISON:  03/25/2018 FINDINGS: CTA CHEST FINDINGS Cardiovascular: SVC patent. Heart size normal. Trace pericardial fluid. The RV is nondilated. Satisfactory opacification of pulmonary arteries noted, and there is no evidence of pulmonary emboli. Adequate contrast opacification of the thoracic aorta with no evidence of dissection, aneurysm, or stenosis. There is classic 3-vessel brachiocephalic arch anatomy without proximal stenosis. Minimal nonocclusive atheromatous aortic plaque. Mediastinum/Nodes: No mediastinal hematoma, mass or adenopathy. Lungs/Pleura: No pleural effusion. No pneumothorax. 1.2 cm somewhat lobular left upper lobe nodule (Im29,Se7) . lungs otherwise clear. Musculoskeletal: Cervical fixation hardware. 1 cm lytic lesion in T4 vertebral body centered to the left of midline, with erosion of the posterior cortex. 0.9 cm lytic lesion in the T12 vertebral body anterior to the right pedicle. Review of the MIP images confirms the above findings. CTA ABDOMEN AND PELVIS FINDINGS VASCULAR Aorta: Moderate partially calcified atheromatous plaque. No aneurysm, dissection, or stenosis. Celiac: Patent without evidence of aneurysm, dissection, vasculitis or significant stenosis. SMA: Patent without evidence of aneurysm, dissection, vasculitis or significant stenosis. Renals: Both renal arteries are patent without evidence of aneurysm, dissection, vasculitis, fibromuscular dysplasia or significant stenosis. IMA: Patent without evidence of aneurysm, dissection, vasculitis or significant stenosis. Inflow: Minimal nonocclusive plaque. No aneurysm or dissection. Visualized proximal outflow mildly atheromatous, patent. Veins: No obvious venous abnormality within the limitations of this arterial phase study. Review of the MIP images confirms the above findings. NON-VASCULAR Hepatobiliary: Multiple ill-defined liver lesions e.g. 1.2 cm low-attenuation lesion (Im128,Se5) . cholecystectomy clips. No biliary ductal  dilatation. Pancreas: Unremarkable. No pancreatic ductal dilatation or surrounding inflammatory changes. Spleen: Normal in size without focal abnormality. Adrenals/Urinary Tract: Adrenal glands are unremarkable. Kidneys are normal, without renal calculi, focal lesion, or hydronephrosis. Bladder is unremarkable. Stomach/Bowel: Stomach is within normal limits. Appendix appears normal. No evidence of bowel wall thickening, distention, or inflammatory changes. Lymphatic: No abdominal or pelvic adenopathy. Reproductive: Status post hysterectomy. No adnexal masses. Other: No ascites.  Pelvic phleboliths.  No free air. Musculoskeletal: Poorly marginated lytic lesion in the posterior aspect of the L5 vertebral body, with pathologic fracture of the superior endplate. Additional smaller ill-defined lytic lesions in L1 and L4 vertebral bodies. Mild bilateral hip DJD. Review of the MIP images confirms the above findings. IMPRESSION: 1. Negative for aortic dissection or aneurysm. 2. 1.2 cm left upper lobe nodule.  For 3. Multiple ill-defined liver lesions, suspicious for metastatic disease. 4. Multiple lytic osseous lesions, suspicious for metastatic disease. Pathologic fracture of the superior endplate of L5. Electronically Signed   By: Corlis Leak M.D.   On:  11/16/2022 16:38   DG Chest Port 1 View  Result Date: 11/16/2022 CLINICAL DATA:  Increased chest pain today. EXAM: PORTABLE CHEST 1 VIEW COMPARISON:  None Available. FINDINGS: No consolidation, pneumothorax or effusion. No edema. Normal cardiopericardial silhouette. Overlapping cardiac leads. Surgical changes along the lower cervical spine at the edge of the imaging field. There is focal nodular density overlying the left upper lobe measuring 11 mm. This overlies the posterior aspect of the left sixth rib. Surgical clips in the right upper quadrant. IMPRESSION: 11 mm left upper lobe lung nodule. Please correlate with any prior dedicated chest CT with contrast to  further delineate when appropriate Electronically Signed   By: Karen Kays M.D.   On: 11/16/2022 12:37   CT HEAD WO CONTRAST ( )  Result Date: 11/12/2022 CLINICAL DATA:  Provided history: Headache, unspecified type. Additional history provided by the scanning technologist: The patient reports left-sided facial numbness. EXAM: CT HEAD WITHOUT CONTRAST TECHNIQUE: Contiguous axial images were obtained from the base of the skull through the vertex without intravenous contrast. RADIATION DOSE REDUCTION: This exam was performed according to the departmental dose-optimization program which includes automated exposure control, adjustment of the mA and/or kV according to patient size and/or use of iterative reconstruction technique. COMPARISON:  Head CT 02/22/2014. FINDINGS: Brain: Cerebral volume is normal. There is no acute intracranial hemorrhage. No demarcated cortical infarct. No extra-axial fluid collection. No evidence of an intracranial mass. No midline shift. Vascular: No hyperdense vessel. Atherosclerotic calcifications within the intracranial internal carotid arteries. Skull: No calvarial fracture or aggressive osseous lesion. Sinuses/Orbits: No mass or acute finding within the imaged orbits. 2 cm mucous retention cyst or polyp within the right maxillary sinus. Small-volume frothy secretions within a posterior right ethmoid air cell. IMPRESSION: 1. Unremarkable non-contrast CT appearance of the brain. No evidence of an acute intracranial abnormality. 2. Atherosclerotic calcifications within the intracranial internal carotid arteries. 3. Paranasal sinus disease as described. Electronically Signed   By: Jackey Loge D.O.   On: 11/12/2022 20:48

## 2022-12-10 NOTE — Assessment & Plan Note (Addendum)
She has referral for palliative radiation Dilaudid 2 mg Every 6 hours as needed Referral to palliative care

## 2022-12-10 NOTE — Telephone Encounter (Signed)
I called and spoke to patient. Report a lot of pain in the lower back, right hip area. PET showed widespread osseous metastases. Pathologic fracture of the superior endplate of L5. Initially she told me she was referred for kyphoplasty but she has not heard from them. Discuss evaluation for palliative radiation to help with her pain. She understands.

## 2022-12-10 NOTE — Assessment & Plan Note (Addendum)
Extensive metastases with large burden.  Will start ipi/nivo today MRI brain pending. Postpone due to need for sedation Request BRAF status Referral for palliative radiation for pain from bone metastases

## 2022-12-11 ENCOUNTER — Encounter: Payer: Self-pay | Admitting: Radiation Oncology

## 2022-12-11 ENCOUNTER — Ambulatory Visit
Admission: RE | Admit: 2022-12-11 | Discharge: 2022-12-11 | Disposition: A | Discharge status: Home / Self Care | Payer: BC Managed Care – PPO | Source: Ambulatory Visit | Attending: Radiation Oncology | Admitting: Radiation Oncology

## 2022-12-11 ENCOUNTER — Inpatient Hospital Stay: Payer: BC Managed Care – PPO

## 2022-12-11 ENCOUNTER — Telehealth: Payer: Self-pay | Admitting: *Deleted

## 2022-12-11 ENCOUNTER — Inpatient Hospital Stay (HOSPITAL_BASED_OUTPATIENT_CLINIC_OR_DEPARTMENT_OTHER): Payer: BC Managed Care – PPO

## 2022-12-11 ENCOUNTER — Other Ambulatory Visit: Payer: Self-pay | Admitting: Urology

## 2022-12-11 ENCOUNTER — Other Ambulatory Visit (HOSPITAL_COMMUNITY): Payer: Self-pay

## 2022-12-11 VITALS — BP 119/81 | HR 122 | Temp 97.7°F | Resp 17 | Ht 63.0 in | Wt 133.5 lb

## 2022-12-11 VITALS — BP 100/71 | HR 88 | Resp 14

## 2022-12-11 DIAGNOSIS — M797 Fibromyalgia: Secondary | ICD-10-CM | POA: Insufficient documentation

## 2022-12-11 DIAGNOSIS — G893 Neoplasm related pain (acute) (chronic): Secondary | ICD-10-CM

## 2022-12-11 DIAGNOSIS — E7800 Pure hypercholesterolemia, unspecified: Secondary | ICD-10-CM | POA: Insufficient documentation

## 2022-12-11 DIAGNOSIS — C438 Malignant melanoma of overlapping sites of skin: Secondary | ICD-10-CM | POA: Insufficient documentation

## 2022-12-11 DIAGNOSIS — C7931 Secondary malignant neoplasm of brain: Secondary | ICD-10-CM | POA: Diagnosis not present

## 2022-12-11 DIAGNOSIS — C7802 Secondary malignant neoplasm of left lung: Secondary | ICD-10-CM | POA: Insufficient documentation

## 2022-12-11 DIAGNOSIS — C771 Secondary and unspecified malignant neoplasm of intrathoracic lymph nodes: Secondary | ICD-10-CM | POA: Insufficient documentation

## 2022-12-11 DIAGNOSIS — C787 Secondary malignant neoplasm of liver and intrahepatic bile duct: Secondary | ICD-10-CM | POA: Insufficient documentation

## 2022-12-11 DIAGNOSIS — K219 Gastro-esophageal reflux disease without esophagitis: Secondary | ICD-10-CM | POA: Insufficient documentation

## 2022-12-11 DIAGNOSIS — C439 Malignant melanoma of skin, unspecified: Secondary | ICD-10-CM | POA: Insufficient documentation

## 2022-12-11 DIAGNOSIS — C7951 Secondary malignant neoplasm of bone: Secondary | ICD-10-CM | POA: Diagnosis not present

## 2022-12-11 DIAGNOSIS — Z803 Family history of malignant neoplasm of breast: Secondary | ICD-10-CM | POA: Diagnosis not present

## 2022-12-11 DIAGNOSIS — Z801 Family history of malignant neoplasm of trachea, bronchus and lung: Secondary | ICD-10-CM | POA: Insufficient documentation

## 2022-12-11 DIAGNOSIS — C4359 Malignant melanoma of other part of trunk: Secondary | ICD-10-CM | POA: Diagnosis not present

## 2022-12-11 DIAGNOSIS — N289 Disorder of kidney and ureter, unspecified: Secondary | ICD-10-CM | POA: Insufficient documentation

## 2022-12-11 DIAGNOSIS — Z79899 Other long term (current) drug therapy: Secondary | ICD-10-CM | POA: Insufficient documentation

## 2022-12-11 DIAGNOSIS — I10 Essential (primary) hypertension: Secondary | ICD-10-CM | POA: Diagnosis not present

## 2022-12-11 DIAGNOSIS — Z5112 Encounter for antineoplastic immunotherapy: Secondary | ICD-10-CM | POA: Diagnosis not present

## 2022-12-11 DIAGNOSIS — Z510 Encounter for antineoplastic radiation therapy: Secondary | ICD-10-CM | POA: Insufficient documentation

## 2022-12-11 DIAGNOSIS — K5900 Constipation, unspecified: Secondary | ICD-10-CM | POA: Diagnosis not present

## 2022-12-11 DIAGNOSIS — Z87891 Personal history of nicotine dependence: Secondary | ICD-10-CM | POA: Diagnosis not present

## 2022-12-11 DIAGNOSIS — Z79620 Long term (current) use of immunosuppressive biologic: Secondary | ICD-10-CM | POA: Diagnosis not present

## 2022-12-11 MED ORDER — SODIUM CHLORIDE 0.9 % IV SOLN
3.0000 mg/kg | Freq: Once | INTRAVENOUS | Status: AC
  Administered 2022-12-11: 200 mg via INTRAVENOUS
  Filled 2022-12-11: qty 40

## 2022-12-11 MED ORDER — MORPHINE SULFATE (PF) 4 MG/ML IV SOLN
10.0000 mg | Freq: Once | INTRAVENOUS | Status: AC
  Administered 2022-12-11: 10 mg via INTRAVENOUS
  Filled 2022-12-11: qty 3

## 2022-12-11 MED ORDER — FAMOTIDINE IN NACL 20-0.9 MG/50ML-% IV SOLN
20.0000 mg | Freq: Once | INTRAVENOUS | Status: AC
  Administered 2022-12-11: 20 mg via INTRAVENOUS
  Filled 2022-12-11: qty 50

## 2022-12-11 MED ORDER — SODIUM CHLORIDE 0.9 % IV SOLN: INTRAVENOUS | Status: DC

## 2022-12-11 MED ORDER — HYDROMORPHONE HCL 1 MG/ML IJ SOLN
2.0000 mg | Freq: Once | INTRAMUSCULAR | Status: AC
  Administered 2022-12-11: 2 mg via INTRAVENOUS
  Filled 2022-12-11: qty 2

## 2022-12-11 MED ORDER — ONDANSETRON HCL 4 MG/2ML IJ SOLN
8.0000 mg | Freq: Once | INTRAMUSCULAR | Status: AC
Start: 2022-12-11 — End: 2022-12-11
  Administered 2022-12-11: 8 mg via INTRAVENOUS
  Filled 2022-12-11: qty 4

## 2022-12-11 MED ORDER — HYDROMORPHONE HCL 2 MG PO TABS
2.0000 mg | ORAL_TABLET | Freq: Four times a day (QID) | ORAL | 0 refills | Status: DC | PRN
  Filled 2022-12-11: qty 30, 8d supply, fill #0

## 2022-12-11 MED ORDER — OXYCODONE HCL ER 20 MG PO T12A
20.0000 mg | EXTENDED_RELEASE_TABLET | Freq: Two times a day (BID) | ORAL | 0 refills | Status: DC
  Filled 2022-12-11: qty 14, 7d supply, fill #0

## 2022-12-11 MED ORDER — DIPHENHYDRAMINE HCL 50 MG/ML IJ SOLN
25.0000 mg | Freq: Once | INTRAMUSCULAR | Status: AC
  Administered 2022-12-11: 25 mg via INTRAVENOUS
  Filled 2022-12-11: qty 1

## 2022-12-11 MED ORDER — HYDROMORPHONE HCL 2 MG PO TABS: 2.0000 mg | ORAL_TABLET | Freq: Four times a day (QID) | ORAL | 0 refills | Status: DC | PRN

## 2022-12-11 MED ORDER — SODIUM CHLORIDE 0.9 % IV SOLN
1.0000 mg/kg | Freq: Once | INTRAVENOUS | Status: AC
  Administered 2022-12-11: 61 mg via INTRAVENOUS
  Filled 2022-12-11: qty 6.1

## 2022-12-11 NOTE — Progress Notes (Signed)
Radiation Oncology         [phone] ________________________________  Initial outpatient Consultation  Name: Laura [name] MRN: [id number]  Date of Service: 12/11/2022 DOB: 1965-08-09  NW:GNFAOZH, Toni Amend, Sheila Oats, MD   REFERRING PHYSICIAN: Melven Sartorius, MD  DIAGNOSIS: 57 y/o woman with painful osseous metastatic disease in her lumbar spine secondary to newly diagnosed widespread metastatic melanoma.    ICD-10-CM   1. Metastatic malignant melanoma (HCC)  C43.9     2. Secondary cancer of bone (HCC)  C79.51     3. Malignant melanoma metastatic to bone [facility])  C79.51       HISTORY OF PRESENT ILLNESS: Laura [name] is a 57 y.o. female seen at the request of Dr. Cherly Hensen.  She has a history of melanoma excised from her back previously.  She recently presented to the emergency department on 11/16/2022 with complaints of back pain for 2 weeks prior and centralized chest pain for several months.  A CT angio C/A/P showed multiple liver lesions, 1 to 2 cm left upper lobe lung nodule and multiple lytic osseous lesions.  She had been having some ongoing fatigue and nausea but no vomiting or unintentional weight loss.  She reported that the back pain radiates into her legs bilaterally, R > L and denies any paresthesias in the lower extremities, focal weakness, hemoptysis or productive cough.  She saw Georga Kaufmann, [name] on 11/18/2022 and a liver biopsy was ordered.  The ultrasound-guided liver biopsy was performed on 11/25/2022 and confirmed metastatic melanoma.  She met with Dr. Cherly Hensen on 12/07/2022 and discussed treatment options for stage IV melanoma with plans to proceed with dual immunotherapy with nivolumab/ipilimumab. A PET scan was performed on 12/09/2022 and confirmed left upper lobe pulmonary metastasis with associated mediastinal lymph node involvement, multifocal liver mets and widespread osseous mets to include the left humeral head, T4, right lateral sixth rib, right  inferior sternum, L4 and left sacrum, amongst others.  Every level of the spine is involved.  She had previously been seen by orthopedics at [facility] and referred for kyphoplasty with interventional radiology for management of her pain secondary to a pathologic fracture of the L5 superior endplate but she has not been contacted by IR to schedule a consult or procedure to date.  Her back pain is severe and only minimally responsive to her pain medications so she was referred to [place] today to discuss the urgent start of palliative radiotherapy for pain management.  She had her first immunotherapy infusion today.  Her husband is accompanying her for our visit today.  She has previously failed attempts to obtain MRI of the spine due to inability to lie still for the procedure secondary to pain so she is now scheduled for an MRI brain and lumbar spine on 12/17/2022 with sedation.  PREVIOUS RADIATION THERAPY: No  PAST MEDICAL HISTORY:  Past Medical History:  Diagnosis Date   Anxiety    Arthritis    Complication of anesthesia    Fibromyalgia    Gallstones 04/20/2018   GERD (gastroesophageal reflux disease)    History of blood transfusion    post Hysterectomy   Hypercholesteremia    Melanoma (HCC)    on back and excised   Migraine    PONV (postoperative nausea and vomiting)    Tobacco abuse    Vertebral artery disease (HCC)    history of left vertebral artery dissection 2011      PAST SURGICAL HISTORY: Past Surgical History:  Procedure Laterality Date   ABDOMINAL HYSTERECTOMY     ANTERIOR FUSION CERVICAL SPINE  2008   BONE GRAFT HIP ILIAC CREST Left    Left Hip to Left Wrist   CHOLECYSTECTOMY N/A 04/20/2018   Procedure: LAPAROSCOPIC CHOLECYSTECTOMY;  Surgeon: Claud Kelp, MD;  Location: [facility] OR;  Service: General;  Laterality: N/A;   thumb surgery Left 2018   [facility]- Tendons Carpal tunnel   WRIST SURGERY  1994    FAMILY HISTORY:  Family History  Problem Relation Age of Onset    Lung cancer Father        smoking hx   Breast cancer Sister        diagnosed over the age of 35   Breast cancer Sister        diagnosed over the age of 26    SOCIAL HISTORY:  Social History   Socioeconomic History   Marital status: Married    Spouse name: Not on file   Number of children: Not on file   Years of education: Not on file   Highest education level: Not on file  Occupational History   Not on file  Tobacco Use   Smoking status: Former    Current packs/day: 0.00    Average packs/day: 1 pack/day for 20.0 years (20.0 ttl pk-yrs)    Types: Cigarettes    Start date: 45    Quit date: 2018    Years since quitting: 6.8   Smokeless tobacco: Never  Vaping Use   Vaping status: Every Day   Substances: Nicotine  Substance and Sexual Activity   Alcohol use: No    Alcohol/week: 0.0 standard drinks of alcohol   Drug use: No   Sexual activity: Not on file  Other Topics Concern   Not on file  Social History Narrative   Married,  3 kids.   Lives home with husband,  Works at [employer].  Education 12th grade.  Caffeine pepsi daily and tea daily.   '   Social Determinants of Health   Financial Resource Strain: Not on file  Food Insecurity: No Food Insecurity (12/11/2022)   Hunger Vital Sign    Worried About Running Out of Food in the Last Year: Never true    Ran Out of Food in the Last Year: Never true  Transportation Needs: No Transportation Needs (12/11/2022)   PRAPARE - Administrator, Civil Service (Medical): No    Lack of Transportation (Non-Medical): No  Physical Activity: Not on file  Stress: Not on file  Social Connections: Not on file  Intimate Partner Violence: Not At Risk (12/11/2022)   Humiliation, Afraid, Rape, and Kick questionnaire    Fear of Current or Ex-Partner: No    Emotionally Abused: No    Physically Abused: No    Sexually Abused: No    ALLERGIES: Aspirin  MEDICATIONS:  Current Outpatient Medications  Medication Sig Dispense  Refill   lidocaine (LIDODERM) 5 % 1 patch daily.     AMBIEN CR 6.25 MG CR tablet 1 tablet at bedtime as needed Orally Once a day for 90 days     CRESTOR 10 MG tablet 1 tablet Orally Once a day for 90 days     HYDROmorphone (DILAUDID) 2 MG tablet Take 1 tablet (2 mg total) by mouth every 6 (six) hours as needed for severe pain (pain score 7-10). 30 tablet 0   LORazepam (ATIVAN) 0.5 MG tablet Take 1 tab 30 minutes before PET and 2 tabs before MRI  30 tablet 0   ondansetron (ZOFRAN) 8 MG tablet Take 1 tablet (8 mg total) by mouth every 8 (eight) hours as needed. 30 tablet 0   oxyCODONE (OXYCONTIN) 20 mg 12 hr tablet Take 1 tablet (20 mg total) by mouth every 12 (twelve) hours. 14 tablet 0   pregabalin (LYRICA) 75 MG capsule 1 capsule Orally twice a day for 90 days     promethazine (PHENERGAN) 25 MG tablet Take 25 mg by mouth every 6 (six) hours as needed for nausea or vomiting.     No current facility-administered medications for this encounter.    REVIEW OF SYSTEMS:  On review of systems, the patient reports that she is doing fair in general but still in a great deal of pain despite having 2mg  Dilaudid IM at 9am and 10 mg Morphine IV at 12:20p. She denies any chest pain, shortness of breath, cough, fevers, chills, night sweats, unintended weight changes. She denies any bowel or bladder disturbances, and denies abdominal pain,or vomiting.  She has severe low back pain as described above in the HPI but denies any new musculoskeletal or joint aches or pains. A complete review of systems is obtained and is otherwise negative.  PHYSICAL EXAM:  Wt Readings from Last 3 Encounters:  12/11/22 133 lb 8 oz (60.6 kg)  12/07/22 134 lb 6.4 oz (61 kg)  11/25/22 136 lb (61.7 kg)   Temp Readings from Last 3 Encounters:  12/11/22 97.7 F (36.5 C) (Oral)  12/07/22 97.8 F (36.6 C) (Oral)  11/25/22 98.9 F (37.2 C) (Oral)   BP Readings from Last 3 Encounters:  12/11/22 100/71  12/11/22 119/81  12/07/22  124/83   Pulse Readings from Last 3 Encounters:  12/11/22 88  12/11/22 (!) 122  12/07/22 (!) 120   Pain Assessment Pain Score: 8  Pain Loc: Buttocks (bone just under butt cheeks)/10  In general this is a well appearing Caucasian woman in obvious pain and unable to sit or stand comfortably.  She's alert and oriented x4 and appropriate throughout the examination. Cardiopulmonary assessment is negative for acute distress and she exhibits normal effort.   KPS = 50  100 - Normal; no complaints; no evidence of disease. 90   - Able to carry on normal activity; minor signs or symptoms of disease. 80   - Normal activity with effort; some signs or symptoms of disease. 5   - Cares for self; unable to carry on normal activity or to do active work. 60   - Requires occasional assistance, but is able to care for most of his personal needs. 50   - Requires considerable assistance and frequent medical care. 40   - Disabled; requires special care and assistance. 30   - Severely disabled; hospital admission is indicated although death not imminent. 20   - Very sick; hospital admission necessary; active supportive treatment necessary. 10   - Moribund; fatal processes progressing rapidly. 0     - Dead  Karnofsky DA, Abelmann WH, Craver LS and [facility] [phone]) The use of the nitrogen mustards in the palliative treatment of carcinoma: with particular reference to bronchogenic carcinoma Cancer 1 634-56  LABORATORY DATA:  Lab Results  Component Value Date   WBC 10.3 12/09/2022   HGB 13.8 12/09/2022   HCT 39.7 12/09/2022   MCV 92.1 12/09/2022   PLT 322 12/09/2022   Lab Results  Component Value Date   NA 136 12/09/2022   K 3.8 12/09/2022   CL 94 (L) 12/09/2022  CO2 34 (H) 12/09/2022   Lab Results  Component Value Date   ALT 26 12/09/2022   AST 63 (H) 12/09/2022   ALKPHOS 158 (H) 12/09/2022   BILITOT 1.2 (H) 12/09/2022     RADIOGRAPHY: NM PET Image Initial (PI) Whole Body  Result  Date: 12/09/2022 CLINICAL DATA:  Initial treatment strategy for metastatic melanoma. EXAM: NUCLEAR MEDICINE PET WHOLE BODY TECHNIQUE: 6.6 mCi F-18 FDG was injected intravenously. Full-ring PET imaging was performed from the head to foot after the radiotracer. CT data was obtained and used for attenuation correction and anatomic localization. Fasting blood glucose: 115 mg/dl COMPARISON:  CTA chest abdomen pelvis dated 11/16/2022 FINDINGS: Mediastinal blood pool activity: SUV max 2.1 HEAD/NECK: No hypermetabolic activity in the scalp. No hypermetabolic cervical lymph nodes. Incidental CT findings: none CHEST: 14 mm posterior left upper lobe pulmonary nodule (series 7/image 20), max SUV 8.7. 11 mm short axis left AP window node (series 4/image 82), max SUV 11.2. Incidental CT findings: Mild atherosclerotic calcifications of the aortic arch. Mild coronary atherosclerosis of the LAD. Mild centrilobular and paraseptal emphysematous changes, upper lung predominant. ABDOMEN/PELVIS: Multifocal hepatic metastases, approximately 10-15 in number, including: --7.4 cm mass in the lateral segment left hepatic lobe (series 4/image 112), max SUV 16.3 --2.9 cm mass in the medial aspect of segment 6 (series 4/image 122), max SUV 14.7 No abnormal hypermetabolism in the pancreas, spleen, or adrenal glands. No hypermetabolic abdominopelvic lymphadenopathy. Incidental CT findings: Status post cholecystectomy. Status post hysterectomy. Mild rectosigmoid colonic wall thickening (series 4/image 135), nonspecific. Atherosclerotic calcifications of the abdominal aorta and branch vessels. SKELETON: Multifocal/widespread osseous metastases, including: --Left humeral head, max SUV 6.4 --T4 vertebral body, max SUV 21.1 --Right lateral 6th rib, max SUV 4.2 --Right inferior sternum, max SUV 12.5 --L4 vertebral body, max SUV 16.3 --Left sacrum, max SUV 15.4 Hypermetabolism involving right anterior thigh musculature, favored to be physiologic.  Incidental CT findings: none EXTREMITIES: No abnormal hypermetabolic activity in the lower extremities. Incidental CT findings: none IMPRESSION: Left upper lobe pulmonary metastasis, as above. Associated mediastinal nodal metastasis. Multifocal hepatic metastases, as above. Multifocal/widespread osseous metastases, as above. Electronically Signed   By: Charline Bills M.D.   On: 12/09/2022 23:34   US BIOPSY (LIVER)  Result Date: 11/25/2022 INDICATION: abnormal CT scan 11/16/2022 concerning for metastatic disease.; Multiple liver lesions EXAM: Ultrasound-guided core needle biopsy of focal liver lesion MEDICATIONS: None. ANESTHESIA/SEDATION: Moderate (conscious) sedation was employed during this procedure. A total of Versed 2 mg and Fentanyl 100 mcg was administered intravenously by the radiology nurse. Total intra-service moderate Sedation Time: 13 minutes. The patient's level of consciousness and vital signs were monitored continuously by radiology nursing throughout the procedure under my direct supervision. COMPLICATIONS: None immediate. PROCEDURE: Informed written consent was obtained from the patient after a thorough discussion of the procedural risks, benefits and alternatives. All questions were addressed. Maximal Sterile Barrier Technique was utilized including caps, mask, sterile gowns, sterile gloves, sterile drape, hand hygiene and skin antiseptic. A timeout was performed prior to the initiation of the procedure. The patient was placed supine on the exam table. Ultrasound of the liver demonstrated multiple focal lesions, the largest of which appeared in the left hepatic lobe, estimated approximately 4 cm in size. Skin entry site was marked, and the overlying skin was prepped draped in the standard sterile fashion. Local analgesia was obtained with 1% lidocaine. Using ultrasound guidance, a 17 gauge introducer needle was advanced towards the identified lesion in the left hepatic lobe. Subsequently,  core needle biopsy was performed of the left hepatic lobe lesion using an 18 gauge core biopsy device x5 total passes. Specimens were submitted in formalin to pathology for further handling. Limited postprocedure imaging demonstrated no hematoma. A clean dressing was placed after manual hemostasis. The patient tolerated the procedure well without immediate complication. IMPRESSION: Successful ultrasound-guided core needle biopsy of focal lesion in the left hepatic lobe. Electronically Signed   By: Olive Bass M.D.   On: 11/25/2022 15:33   CT Angio Chest/Abd/Pel for Dissection W and/or Wo Contrast  Result Date: 11/16/2022 CLINICAL DATA:  Acute aortic syndrome, low back pain, chest pain, left arm pain EXAM: CT ANGIOGRAPHY CHEST, ABDOMEN AND PELVIS TECHNIQUE: Non-contrast CT of the chest was initially obtained. Multidetector CT imaging through the chest, abdomen and pelvis was performed using the standard protocol during bolus administration of intravenous contrast. Multiplanar reconstructed images and MIPs were obtained and reviewed to evaluate the vascular anatomy. RADIATION DOSE REDUCTION: This exam was performed according to the departmental dose-optimization program which includes automated exposure control, adjustment of the mA and/or kV according to patient size and/or use of iterative reconstruction technique. CONTRAST:  OMNIPAQUE IOHEXOL 350 MG/ML SOLN COMPARISON:  03/25/2018 FINDINGS: CTA CHEST FINDINGS Cardiovascular: SVC patent. Heart size normal. Trace pericardial fluid. The RV is nondilated. Satisfactory opacification of pulmonary arteries noted, and there is no evidence of pulmonary emboli. Adequate contrast opacification of the thoracic aorta with no evidence of dissection, aneurysm, or stenosis. There is classic 3-vessel brachiocephalic arch anatomy without proximal stenosis. Minimal nonocclusive atheromatous aortic plaque. Mediastinum/Nodes: No mediastinal hematoma, mass or adenopathy.  Lungs/Pleura: No pleural effusion. No pneumothorax. 1.2 cm somewhat lobular left upper lobe nodule (Im29,Se7) . lungs otherwise clear. Musculoskeletal: Cervical fixation hardware. 1 cm lytic lesion in T4 vertebral body centered to the left of midline, with erosion of the posterior cortex. 0.9 cm lytic lesion in the T12 vertebral body anterior to the right pedicle. Review of the MIP images confirms the above findings. CTA ABDOMEN AND PELVIS FINDINGS VASCULAR Aorta: Moderate partially calcified atheromatous plaque. No aneurysm, dissection, or stenosis. Celiac: Patent without evidence of aneurysm, dissection, vasculitis or significant stenosis. SMA: Patent without evidence of aneurysm, dissection, vasculitis or significant stenosis. Renals: Both renal arteries are patent without evidence of aneurysm, dissection, vasculitis, fibromuscular dysplasia or significant stenosis. IMA: Patent without evidence of aneurysm, dissection, vasculitis or significant stenosis. Inflow: Minimal nonocclusive plaque. No aneurysm or dissection. Visualized proximal outflow mildly atheromatous, patent. Veins: No obvious venous abnormality within the limitations of this arterial phase study. Review of the MIP images confirms the above findings. NON-VASCULAR Hepatobiliary: Multiple ill-defined liver lesions e.g. 1.2 cm low-attenuation lesion (Im128,Se5) . cholecystectomy clips. No biliary ductal dilatation. Pancreas: Unremarkable. No pancreatic ductal dilatation or surrounding inflammatory changes. Spleen: Normal in size without focal abnormality. Adrenals/Urinary Tract: Adrenal glands are unremarkable. Kidneys are normal, without renal calculi, focal lesion, or hydronephrosis. Bladder is unremarkable. Stomach/Bowel: Stomach is within normal limits. Appendix appears normal. No evidence of bowel wall thickening, distention, or inflammatory changes. Lymphatic: No abdominal or pelvic adenopathy. Reproductive: Status post hysterectomy. No adnexal  masses. Other: No ascites.  Pelvic phleboliths.  No free air. Musculoskeletal: Poorly marginated lytic lesion in the posterior aspect of the L5 vertebral body, with pathologic fracture of the superior endplate. Additional smaller ill-defined lytic lesions in L1 and L4 vertebral bodies. Mild bilateral hip DJD. Review of the MIP images confirms the above findings. IMPRESSION: 1. Negative for aortic dissection or aneurysm. 2. 1.2 cm left  upper lobe nodule.  For 3. Multiple ill-defined liver lesions, suspicious for metastatic disease. 4. Multiple lytic osseous lesions, suspicious for metastatic disease. Pathologic fracture of the superior endplate of L5. Electronically Signed   By: Corlis Leak M.D.   On: 11/16/2022 16:38   DG Chest Port 1 View  Result Date: 11/16/2022 CLINICAL DATA:  Increased chest pain today. EXAM: PORTABLE CHEST 1 VIEW COMPARISON:  None Available. FINDINGS: No consolidation, pneumothorax or effusion. No edema. Normal cardiopericardial silhouette. Overlapping cardiac leads. Surgical changes along the lower cervical spine at the edge of the imaging field. There is focal nodular density overlying the left upper lobe measuring 11 mm. This overlies the posterior aspect of the left sixth rib. Surgical clips in the right upper quadrant. IMPRESSION: 11 mm left upper lobe lung nodule. Please correlate with any prior dedicated chest CT with contrast to further delineate when appropriate Electronically Signed   By: Karen Kays M.D.   On: 11/16/2022 12:37   CT HEAD WO CONTRAST ( )  Result Date: 11/12/2022 CLINICAL DATA:  Provided history: Headache, unspecified type. Additional history provided by the scanning technologist: The patient reports left-sided facial numbness. EXAM: CT HEAD WITHOUT CONTRAST TECHNIQUE: Contiguous axial images were obtained from the base of the skull through the vertex without intravenous contrast. RADIATION DOSE REDUCTION: This exam was performed according to the  departmental dose-optimization program which includes automated exposure control, adjustment of the mA and/or kV according to patient size and/or use of iterative reconstruction technique. COMPARISON:  Head CT 02/22/2014. FINDINGS: Brain: Cerebral volume is normal. There is no acute intracranial hemorrhage. No demarcated cortical infarct. No extra-axial fluid collection. No evidence of an intracranial mass. No midline shift. Vascular: No hyperdense vessel. Atherosclerotic calcifications within the intracranial internal carotid arteries. Skull: No calvarial fracture or aggressive osseous lesion. Sinuses/Orbits: No mass or acute finding within the imaged orbits. 2 cm mucous retention cyst or polyp within the right maxillary sinus. Small-volume frothy secretions within a posterior right ethmoid air cell. IMPRESSION: 1. Unremarkable non-contrast CT appearance of the brain. No evidence of an acute intracranial abnormality. 2. Atherosclerotic calcifications within the intracranial internal carotid arteries. 3. Paranasal sinus disease as described. Electronically Signed   By: Jackey Loge D.O.   On: 11/12/2022 20:48      IMPRESSION/PLAN: 1. 57 y.o. woman with painful osseous metastatic disease in her lumbar spine secondary to newly diagnosed widespread metastatic melanoma. Today, we talked to the patient and her husband about the findings and workup thus far. We discussed the natural history of metastatic melanoma and general treatment, highlighting the role of palliative radiotherapy in the management of painful osseous metastases. We discussed the available radiation techniques, and focused on the details and logistics of delivery. We reviewed the anticipated acute and late sequelae associated with radiation in this setting. The patient was encouraged to ask questions that were answered to her stated satisfaction.  At the conclusion of our conversation, she elects to proceed with the recommended 2-week course of  daily palliative radiotherapy.  She has freely signed written consent to proceed today in the office and a copy of this document will be placed in her medical record.  We will proceed with CT simulation/treatment planning following our visit today in anticipation of beginning her treatments on Monday, 12/14/2022.  Her pain is still not well-controlled with Dilaudid every 4 hours so I have also sent in a prescription for OxyContin 20 mg p.o. every 12 hours.  She can still continue to use  the Dilaudid as needed for breakthrough pain.  A referral to palliative care has already been placed to help [place] with pain management to get her through her palliative radiation treatments, in hopes that this will give her more sustained pain relief and she will need less of her pain medications.  She will also continue in routine follow-up under the care and direction of Dr. Cherly Hensen for continued management of her systemic disease.  We enjoyed meeting her and her husband today and look forward to continuing to participate in her care.  They know that they are welcome to call at anytime with any questions or concerns in the interim.  We personally spent 60 minutes in this encounter including chart review, reviewing radiological studies, meeting face-to-face with the patient, entering orders and completing documentation.    Marguarite Arbour, [name]    Margaretmary Dys, MD  [facility]  Radiation Oncology Direct Dial: [phone]  Fax: [phone] [email or web address]  Skype  LinkedIn

## 2022-12-11 NOTE — Assessment & Plan Note (Signed)
Increase fluid intake.

## 2022-12-11 NOTE — Progress Notes (Signed)
Ipilimumab (YERVOY) Patient Monitoring Assessment   Is the patient experiencing any of the following general symptoms?:  [ X]Difficulty performing normal activities [ ] Feeling sluggish or cold all the time [ ] Unusual weight gain [ ] Constant or unusual headaches [ ] Feeling dizzy or faint [ ] Changes in eyesight (blurry vision, double vision, or other vision problems) [X ]Changes in mood or behavior (ex: decreased sex drive, irritability, or forgetfulness) [ ] Starting new medications (ex: steroids, other medications that lower immune response) [ ] Patient is not experiencing any of the general symptoms above.   Gastrointestinal  Patient is having 1 bowel movements each day.  Is this different from baseline? [ ] Yes [ X]No Are your stools watery or do they have a foul smell? [ ] Yes [ ] XNo Have you seen blood in your stools? [ ] Yes [ X]No Are your stools dark, tarry, or sticky? [ ] Yes [ X]No Are you having pain or tenderness in your belly? [ ] Yes [X ]No  Skin Does your skin itch? [ ] Yes [X ]No Do you have a rash? [ ] Yes [ X]No Has your skin blistered and/or peeled? [ ] Yes [ X]No Do you have sores in your mouth? [ ] Yes [ X]No  Hepatic Has your urine been dark or tea colored? [ ] Yes [X ]No Have you noticed that your skin or the whites of your eyes are turning yellow? [ ] Yes [X ]No Are you bleeding or bruising more easily than normal? [ ] Yes [X ]No Are you nauseous and/or vomiting? [ ] Yes [ X]No Do you have pain on the right side of your stomach? [ ] Yes [X ]No  Neurologic  Are you having unusual weakness of legs, arms, or face? [ ] Yes [X ]No Are you having numbness or tingling in your hands or feet? [X ]Yes [ ] No  Consepcion Hearing

## 2022-12-11 NOTE — Telephone Encounter (Signed)
Faxed request to [facility] Pathology

## 2022-12-11 NOTE — Telephone Encounter (Signed)
-----   Message from Melven Sartorius sent at 12/11/2022  9:11 AM EST ----- Montez Cuda please request BRAF testing on her surg path from 10/30. Thanks.

## 2022-12-11 NOTE — Progress Notes (Signed)
  Radiation Oncology         [phone] ________________________________  Name: DAESHA SUTHERBY MRN: [id number]  Date: 12/11/2022  DOB: [birth year]  SIMULATION AND TREATMENT PLANNING NOTE    ICD-10-CM   1. Metastatic malignant melanoma (HCC)  C43.9       DIAGNOSIS:  57 y/o woman with painful osseous metastatic disease in her lumbar spine secondary to newly diagnosed widespread metastatic melanoma.   NARRATIVE:  The patient was brought to the CT Simulation planning suite.  Identity was confirmed.  All relevant records and images related to the planned course of therapy were reviewed.  The patient freely provided informed written consent to proceed with treatment after reviewing the details related to the planned course of therapy. The consent form was witnessed and verified by the simulation staff.  Then, the patient was set-up in a stable reproducible  supine position for radiation therapy.  CT images were obtained.  Surface markings were placed.  The CT images were loaded into the planning software.  Then the target and avoidance structures were contoured.  Treatment planning then occurred.  The radiation prescription was entered and confirmed.  Then, I designed and supervised the construction of a total of 3 medically necessary complex treatment devices consisting of leg positioner and MLC apertures to cover the treated hip area.  I have requested : 3D Simulation  I have requested a DVH of the following structures: left kidney, right kidney, bowel and target.  PLAN:  The patient will receive 30 Gy in 10 fractions.  ________________________________  Artist Pais Kathrynn Running, M.D.

## 2022-12-11 NOTE — Patient Instructions (Signed)
[facility] - A DEPT OF [facility]  Discharge Instructions: Thank you for choosing [facility] to provide your oncology and hematology care.   If you have a lab appointment with the Cancer Center, please go directly to the Cancer Center and check in at the registration area.   Wear comfortable clothing and clothing appropriate for easy access to any Portacath or PICC line.   We strive to give you quality time with your provider. You may need to reschedule your appointment if you arrive late (15 or more minutes).  Arriving late affects you and other patients whose appointments are after yours.  Also, if you miss three or more appointments without notifying the office, you may be dismissed from the clinic at the provider's discretion.      For prescription refill requests, have your pharmacy contact our office and allow 72 hours for refills to be completed.    Today you received the following chemotherapy and/or immunotherapy agents: nivolumab, ipilimumab      To help prevent nausea and vomiting after your treatment, we encourage you to take your nausea medication as directed.  BELOW ARE SYMPTOMS THAT SHOULD BE REPORTED IMMEDIATELY: *FEVER GREATER THAN 100.4 F (38 C) OR HIGHER *CHILLS OR SWEATING *NAUSEA AND VOMITING THAT IS NOT CONTROLLED WITH YOUR NAUSEA MEDICATION *UNUSUAL SHORTNESS OF BREATH *UNUSUAL BRUISING OR BLEEDING *URINARY PROBLEMS (pain or burning when urinating, or frequent urination) *BOWEL PROBLEMS (unusual diarrhea, constipation, pain near the anus) TENDERNESS IN MOUTH AND THROAT WITH OR WITHOUT PRESENCE OF ULCERS (sore throat, sores in mouth, or a toothache) UNUSUAL RASH, SWELLING OR PAIN  UNUSUAL VAGINAL DISCHARGE OR ITCHING   Items with * indicate a potential emergency and should be followed up as soon as possible or go to the Emergency Department if any problems should occur.  Please show the CHEMOTHERAPY ALERT CARD or  IMMUNOTHERAPY ALERT CARD at check-in to the Emergency Department and triage nurse.  Should you have questions after your visit or need to cancel or reschedule your appointment, please contact [facility] - A DEPT OF [facility]  Dept: [phone]  and follow the prompts.  Office hours are 8:00 a.m. to 4:30 p.m. Monday - Friday. Please note that voicemails left after 4:00 p.m. may not be returned until the following business day.  We are closed weekends and major holidays. You have access to a nurse at all times for urgent questions. Please call the main number to the clinic Dept: [phone] and follow the prompts.   For any non-urgent questions, you may also contact your provider using MyChart. We now offer e-Visits for anyone 80 and older to request care online for non-urgent symptoms. For details visit [email or web address].   Also download the MyChart app! Go to the app store, search "MyChart", open the app, select [facility], and log in with your MyChart username and password.

## 2022-12-11 NOTE — Progress Notes (Signed)
Histology and Location of Primary Cancer: Melanoma  Sites of Visceral and Bony Metastatic Disease: Multifocal/widespread osseous (left humeral  head, T4, right lateral 6th rib, right inferior sternum, L4, left sacrum)  Location(s) of Symptomatic Metastases: Multifocal/widespread osseous (left humeral  head, T4, right lateral 6th rib, right inferior sternum, L4, left sacrum)  12/09/2022 Dr. Geanie Berlin NM PET Image Initial (PI) Whole Body CLINICAL DATA: Initial treatment strategy for metastatic melanoma.   IMPRESSION: Left upper lobe pulmonary metastasis, as above. Associated mediastinal nodal metastasis. Multifocal hepatic metastases, as above. Multifocal/widespread osseous metastases, as above.  11/16/2022 Dr. Melene Plan CT Angio Chest/Abd/Pel for Dissection with/without Contrast CLINICAL DATA:  Acute aortic syndrome, low back pain, chest pain, left arm pain  IMPRESSION: 1. Negative for aortic dissection or aneurysm. 2. 1.2 cm left upper lobe nodule.  For 3. Multiple ill-defined liver lesions, suspicious for metastatic disease. 4. Multiple lytic osseous lesions, suspicious for metastatic disease. Pathologic fracture of the superior endplate of L5   Past/Anticipated chemotherapy by medical oncology, if any: NA  Pain on a scale of 0-10 is: 8/10 was given Dilaudid earlier.  Bone just under bilateral buttocks.  Hurts to sit.   If Spine Met(s), symptoms, if any, include: Bowel/Bladder retention or incontinence (please describe): Constipation from pain medication Numbness or weakness in extremities (please describe): Yes bilateral feet and toes, private area (lady parts). Current Decadron regimen, if applicable: No  Ambulatory status? Walker? Wheelchair?: No  SAFETY ISSUES: Prior radiation? No Pacemaker/ICD? No Possible current pregnancy? Hysterectomy Is the patient on methotrexate? No  Current Complaints / other details:

## 2022-12-11 NOTE — Addendum Note (Signed)
Addended by: Geanie Berlin on: 12/11/2022 10:02 AM   Modules accepted: Orders

## 2022-12-12 ENCOUNTER — Ambulatory Visit (HOSPITAL_COMMUNITY): Payer: BC Managed Care – PPO

## 2022-12-13 ENCOUNTER — Other Ambulatory Visit: Payer: Self-pay

## 2022-12-14 ENCOUNTER — Other Ambulatory Visit: Payer: Self-pay | Admitting: *Deleted

## 2022-12-14 ENCOUNTER — Other Ambulatory Visit: Payer: Self-pay

## 2022-12-14 ENCOUNTER — Other Ambulatory Visit (HOSPITAL_COMMUNITY): Payer: Self-pay

## 2022-12-14 ENCOUNTER — Inpatient Hospital Stay: Payer: BC Managed Care – PPO

## 2022-12-14 ENCOUNTER — Ambulatory Visit
Admission: RE | Admit: 2022-12-14 | Discharge: 2022-12-14 | Disposition: A | Discharge status: Home / Self Care | Payer: BC Managed Care – PPO | Source: Ambulatory Visit | Attending: Radiation Oncology | Admitting: Radiation Oncology

## 2022-12-14 DIAGNOSIS — M797 Fibromyalgia: Secondary | ICD-10-CM | POA: Diagnosis not present

## 2022-12-14 DIAGNOSIS — C7802 Secondary malignant neoplasm of left lung: Secondary | ICD-10-CM | POA: Diagnosis not present

## 2022-12-14 DIAGNOSIS — Z803 Family history of malignant neoplasm of breast: Secondary | ICD-10-CM | POA: Diagnosis not present

## 2022-12-14 DIAGNOSIS — I10 Essential (primary) hypertension: Secondary | ICD-10-CM | POA: Diagnosis not present

## 2022-12-14 DIAGNOSIS — Z801 Family history of malignant neoplasm of trachea, bronchus and lung: Secondary | ICD-10-CM | POA: Diagnosis not present

## 2022-12-14 DIAGNOSIS — G893 Neoplasm related pain (acute) (chronic): Secondary | ICD-10-CM | POA: Diagnosis not present

## 2022-12-14 DIAGNOSIS — Z79899 Other long term (current) drug therapy: Secondary | ICD-10-CM | POA: Diagnosis not present

## 2022-12-14 DIAGNOSIS — C439 Malignant melanoma of skin, unspecified: Secondary | ICD-10-CM | POA: Diagnosis not present

## 2022-12-14 DIAGNOSIS — Z5112 Encounter for antineoplastic immunotherapy: Secondary | ICD-10-CM | POA: Diagnosis not present

## 2022-12-14 DIAGNOSIS — C7931 Secondary malignant neoplasm of brain: Secondary | ICD-10-CM | POA: Diagnosis not present

## 2022-12-14 DIAGNOSIS — Z79620 Long term (current) use of immunosuppressive biologic: Secondary | ICD-10-CM | POA: Diagnosis not present

## 2022-12-14 DIAGNOSIS — Z510 Encounter for antineoplastic radiation therapy: Secondary | ICD-10-CM | POA: Diagnosis not present

## 2022-12-14 DIAGNOSIS — E7800 Pure hypercholesterolemia, unspecified: Secondary | ICD-10-CM | POA: Diagnosis not present

## 2022-12-14 DIAGNOSIS — Z87891 Personal history of nicotine dependence: Secondary | ICD-10-CM | POA: Diagnosis not present

## 2022-12-14 DIAGNOSIS — C787 Secondary malignant neoplasm of liver and intrahepatic bile duct: Secondary | ICD-10-CM | POA: Diagnosis not present

## 2022-12-14 DIAGNOSIS — N289 Disorder of kidney and ureter, unspecified: Secondary | ICD-10-CM | POA: Diagnosis not present

## 2022-12-14 DIAGNOSIS — K219 Gastro-esophageal reflux disease without esophagitis: Secondary | ICD-10-CM | POA: Diagnosis not present

## 2022-12-14 DIAGNOSIS — C771 Secondary and unspecified malignant neoplasm of intrathoracic lymph nodes: Secondary | ICD-10-CM | POA: Diagnosis not present

## 2022-12-14 DIAGNOSIS — K5900 Constipation, unspecified: Secondary | ICD-10-CM | POA: Diagnosis not present

## 2022-12-14 DIAGNOSIS — C4359 Malignant melanoma of other part of trunk: Secondary | ICD-10-CM | POA: Diagnosis not present

## 2022-12-14 DIAGNOSIS — C7951 Secondary malignant neoplasm of bone: Secondary | ICD-10-CM | POA: Diagnosis not present

## 2022-12-14 LAB — RAD ONC ARIA SESSION SUMMARY
Course Elapsed Days: 0
Plan Fractions Treated to Date: 1
Plan Prescribed Dose Per Fraction: 3 Gy
Plan Total Fractions Prescribed: 10
Plan Total Prescribed Dose: 30 Gy
Reference Point Dosage Given to Date: 3 Gy
Reference Point Session Dosage Given: 3 Gy
Session Number: 1

## 2022-12-14 MED ORDER — MORPHINE SULFATE ER 30 MG PO TBCR: 30.0000 mg | EXTENDED_RELEASE_TABLET | Freq: Two times a day (BID) | ORAL | 0 refills | Status: DC

## 2022-12-14 MED ORDER — CYCLOBENZAPRINE HCL 5 MG PO TABS: 5.0000 mg | ORAL_TABLET | Freq: Three times a day (TID) | ORAL | 0 refills | Status: DC | PRN

## 2022-12-14 NOTE — Progress Notes (Signed)
[facility] Clinical Social Work  Initial Assessment   Laura [name] is a 57 y.o. year old female contacted by phone. Clinical Social Work was referred by new patient protocol for assessment of psychosocial needs.   SDOH (Social Determinants of Health) assessments performed: No   SDOH Screenings   Food Insecurity: No Food Insecurity (12/11/2022)  Housing: Low Risk  (12/11/2022)  Transportation Needs: No Transportation Needs (12/11/2022)  Utilities: Not At Risk (12/11/2022)  Alcohol Screen: Low Risk  (12/11/2022)  Depression (PHQ2-9): Low Risk  (12/11/2022)  Tobacco Use: Medium Risk (12/11/2022)     Distress Screen completed: No     No data to display            Family/Social Information:  Housing Arrangement: patient lives with husband and three dogs.  Family members/support persons in your life? Family and Friends Transportation concerns: no, Patient's husband has been transporting to/from appointments.  Employment:  Patient is not working at this time (approx three years).  Income source: Supported by husband Surveyor, quantity concerns:  Company secretary Concerns  Type of concern: Medical bills Food access concerns: no, not at this time Religious or spiritual practice: No Services Currently in place:  Supportive Family Members, Insurance, Transportation, Stable Housing  Coping/ Adjustment to diagnosis: Patient understands treatment plan and what happens next? yes Concerns about diagnosis and/or treatment: How I will pay for the services I need Patient reported stressors: Physical issues - Increased Paid Current coping skills/ strengths: Ability for insight , Average or above average intelligence , Communication skills , General fund of knowledge , Motivation for treatment/growth , and Supportive family/friends     SUMMARY: Current SDOH Barriers:  Financial constraints related to Medical Bills   Clinical Social Work Clinical Goal(s):  No clinical social work goals at this  time  Interventions: Discussed common feeling and emotions when being diagnosed with cancer, and the importance of support during treatment Informed patient of the support team roles and support services at [employer] (Cytogeneticist, Higher education careers adviser) Provided CSW contact information and encouraged patient to call with any questions or concerns Provided patient with information about [employer] and [employer] (verbal auth to email information to address listed on file)    Follow Up Plan: Patient will contact CSW with any support or resource needs Patient verbalizes understanding of plan: Yes   Marguerita Merles, LCSW Clinical Social Worker [employer] [facility]

## 2022-12-14 NOTE — Progress Notes (Signed)
Morphine 30 mg ER twice daily sent to pharmacy.

## 2022-12-14 NOTE — Telephone Encounter (Signed)
Called & left message for pt to return call to let us know how she is doing.

## 2022-12-14 NOTE — Telephone Encounter (Signed)
-----   Message from Nurse Alycia Rossetti H sent at 12/11/2022 12:24 PM EST ----- Regarding: 1st Time follow up 1st Time Opdivo/Yervoy Severe pain issues during treatment. Dr Nelta Numbers patient.

## 2022-12-14 NOTE — Progress Notes (Unsigned)
Palliative Medicine [facility]  Telephone:(336) [phone] Fax:([phone]) [phone]   Name: Laura [name] Date: 12/14/2022 MRN: [id number]  DOB: 1965/11/24  Patient Care Team: [name], [name] as PCP - General (Family Medicine)    REASON FOR CONSULTATION: Laura [name] is a 57 y.o. female with oncologic medical history including malignant melanoma (10/2022) with metastatic disease to the bone. As well as a history of hypertension, hyperlipidemia, GERD, and arthritis. Palliative ask to see for symptom management and goals of care.    SOCIAL HISTORY:     reports that she quit smoking about 6 years ago. Her smoking use included cigarettes. She started smoking about 26 years ago. She has a 20 pack-year smoking history. She has never used smokeless tobacco. She reports that she does not drink alcohol and does not use drugs.  ADVANCE DIRECTIVES:  None on file  CODE STATUS: Full code  PAST MEDICAL HISTORY: Past Medical History:  Diagnosis Date  . Anxiety   . Arthritis   . Complication of anesthesia   . Fibromyalgia   . Gallstones 04/20/2018  . GERD (gastroesophageal reflux disease)   . History of blood transfusion    post Hysterectomy  . Hypercholesteremia   . Melanoma (HCC)    on back and excised  . Migraine   . PONV (postoperative nausea and vomiting)   . Tobacco abuse   . Vertebral artery disease (HCC)    history of left vertebral artery dissection 2011    PAST SURGICAL HISTORY:  Past Surgical History:  Procedure Laterality Date  . ABDOMINAL HYSTERECTOMY    . ANTERIOR FUSION CERVICAL SPINE  2008  . BONE GRAFT HIP ILIAC CREST Left    Left Hip to Left Wrist  . CHOLECYSTECTOMY N/A 04/20/2018   Procedure: LAPAROSCOPIC CHOLECYSTECTOMY;  Surgeon: Claud Kelp, MD;  Location: [facility] OR;  Service: General;  Laterality: N/A;  . thumb surgery Left 2018   [facility]- Tendons Carpal tunnel  . WRIST SURGERY  1994    HEMATOLOGY/ONCOLOGY HISTORY:  Oncology  History  Metastatic malignant melanoma (HCC)  11/16/2022 Imaging   She presented to the emergency room on 11/16/2022 for back pain x 2 weeks and central chest pain for several months   CTA CAP IMPRESSION: 1. Negative for aortic dissection or aneurysm. 2. 1.2 cm left upper lobe nodule.  For 3. Multiple ill-defined liver lesions, suspicious for metastatic disease. 4. Multiple lytic osseous lesions, suspicious for metastatic disease. Pathologic fracture of the superior endplate of L5   11/25/2022 Pathology Results   1. Liver, needle/core biopsy, Left hepatic lobe lesion :       - METASTATIC MELANOMA.    12/04/2022 Initial Diagnosis   Melanoma of skin (HCC) Liver, bone metastases. 1.2 cm LUL nodule   12/09/2022 PET scan   PET: Diffuse bone and liver metastases.  Lung metastases.    12/11/2022 -  Chemotherapy   Patient is on Treatment Plan : MELANOMA Nivolumab (1) + Ipilimumab (3) q21d / Nivolumab (480) q28d       ALLERGIES:  is allergic to aspirin.  MEDICATIONS:  Current Outpatient Medications  Medication Sig Dispense Refill  . AMBIEN CR 6.25 MG CR tablet Take 6.25 mg by mouth at bedtime.    . CRESTOR 10 MG tablet Take 10 mg by mouth daily.    . famotidine-calcium carbonate-magnesium hydroxide (PEPCID COMPLETE) 10-800-165 MG chewable tablet Chew 1 tablet by mouth daily as needed (acid reflux).    Marland Kitchen HYDROmorphone (DILAUDID) 2 MG tablet  Take 1 tablet (2 mg total) by mouth every 6 (six) hours as needed for severe pain (pain score 7-10). 30 tablet 0  . ibuprofen (ADVIL) 200 MG tablet Take 400 mg by mouth every 6 (six) hours as needed for moderate pain (pain score 4-6).    Marland Kitchen lidocaine (LIDODERM) 5 % Place 1 patch onto the skin daily.    Marland Kitchen LORazepam (ATIVAN) 0.5 MG tablet Take 1 tab 30 minutes before PET and 2 tabs before MRI 30 tablet 0  . ondansetron (ZOFRAN) 8 MG tablet Take 1 tablet (8 mg total) by mouth every 8 (eight) hours as needed. 30 tablet 0  . oxyCODONE (OXYCONTIN) 20 mg 12  hr tablet Take 1 tablet (20 mg total) by mouth every 12 (twelve) hours. 14 tablet 0  . polyethylene glycol (MIRALAX / GLYCOLAX) 17 g packet Take 17 g by mouth daily.    . pregabalin (LYRICA) 75 MG capsule Take 75 mg by mouth 3 (three) times daily.    . promethazine (PHENERGAN) 25 MG tablet Take 25 mg by mouth every 12 (twelve) hours as needed for nausea or vomiting.    . senna-docusate (SENOKOT-S) 8.6-50 MG tablet Take 1 tablet by mouth 2 (two) times daily.     No current facility-administered medications for this visit.    VITAL SIGNS: There were no vitals taken for this visit. There were no vitals filed for this visit.  Estimated body mass index is 23.65 kg/m as calculated from the following:   Height as of 12/11/22: 5\' 3"  (1.6 m).   Weight as of 12/11/22: 133 lb 8 oz (60.6 kg).  LABS: CBC:    Component Value Date/Time   WBC 10.3 12/09/2022 1528   WBC 7.5 11/16/2022 1042   HGB 13.8 12/09/2022 1528   HCT 39.7 12/09/2022 1528   PLT 322 12/09/2022 1528   MCV 92.1 12/09/2022 1528   NEUTROABS 8.2 (H) 12/09/2022 1528   LYMPHSABS 1.5 12/09/2022 1528   MONOABS 0.5 12/09/2022 1528   EOSABS 0.0 12/09/2022 1528   BASOSABS 0.1 12/09/2022 1528   Comprehensive Metabolic Panel:    Component Value Date/Time   NA 136 12/09/2022 1528   K 3.8 12/09/2022 1528   CL 94 (L) 12/09/2022 1528   CO2 34 (H) 12/09/2022 1528   BUN 14 12/09/2022 1528   CREATININE 1.26 (H) 12/09/2022 1528   GLUCOSE 99 12/09/2022 1528   CALCIUM 10.7 (H) 12/09/2022 1528   AST 63 (H) 12/09/2022 1528   ALT 26 12/09/2022 1528   ALKPHOS 158 (H) 12/09/2022 1528   BILITOT 1.2 (H) 12/09/2022 1528   PROT 7.7 12/09/2022 1528   ALBUMIN 4.3 12/09/2022 1528    RADIOGRAPHIC STUDIES: NM PET Image Initial (PI) Whole Body  Result Date: 12/09/2022 CLINICAL DATA:  Initial treatment strategy for metastatic melanoma. EXAM: NUCLEAR MEDICINE PET WHOLE BODY TECHNIQUE: 6.6 mCi F-18 FDG was injected intravenously. Full-ring PET  imaging was performed from the head to foot after the radiotracer. CT data was obtained and used for attenuation correction and anatomic localization. Fasting blood glucose: 115 mg/dl COMPARISON:  CTA chest abdomen pelvis dated 11/16/2022 FINDINGS: Mediastinal blood pool activity: SUV max 2.1 HEAD/NECK: No hypermetabolic activity in the scalp. No hypermetabolic cervical lymph nodes. Incidental CT findings: none CHEST: 14 mm posterior left upper lobe pulmonary nodule (series 7/image 20), max SUV 8.7. 11 mm short axis left AP window node (series 4/image 82), max SUV 11.2. Incidental CT findings: Mild atherosclerotic calcifications of the aortic arch. Mild coronary atherosclerosis  of the LAD. Mild centrilobular and paraseptal emphysematous changes, upper lung predominant. ABDOMEN/PELVIS: Multifocal hepatic metastases, approximately 10-15 in number, including: --7.4 cm mass in the lateral segment left hepatic lobe (series 4/image 112), max SUV 16.3 --2.9 cm mass in the medial aspect of segment 6 (series 4/image 122), max SUV 14.7 No abnormal hypermetabolism in the pancreas, spleen, or adrenal glands. No hypermetabolic abdominopelvic lymphadenopathy. Incidental CT findings: Status post cholecystectomy. Status post hysterectomy. Mild rectosigmoid colonic wall thickening (series 4/image 135), nonspecific. Atherosclerotic calcifications of the abdominal aorta and branch vessels. SKELETON: Multifocal/widespread osseous metastases, including: --Left humeral head, max SUV 6.4 --T4 vertebral body, max SUV 21.1 --Right lateral 6th rib, max SUV 4.2 --Right inferior sternum, max SUV 12.5 --L4 vertebral body, max SUV 16.3 --Left sacrum, max SUV 15.4 Hypermetabolism involving right anterior thigh musculature, favored to be physiologic. Incidental CT findings: none EXTREMITIES: No abnormal hypermetabolic activity in the lower extremities. Incidental CT findings: none IMPRESSION: Left upper lobe pulmonary metastasis, as above.  Associated mediastinal nodal metastasis. Multifocal hepatic metastases, as above. Multifocal/widespread osseous metastases, as above. Electronically Signed   By: Charline Bills M.D.   On: 12/09/2022 23:34   PERFORMANCE STATUS (ECOG) : {CHL ONC ECOG WU:[id number]}  Review of Systems Unless otherwise noted, a complete review of systems is negative.  Physical Exam General: NAD Cardiovascular: regular rate and rhythm Pulmonary: clear ant fields Abdomen: soft, nontender, + bowel sounds Extremities: no edema, no joint deformities Skin: no rashes Neurological: Alert and oriented x3  IMPRESSION: *** I introduced myself, [name] RN, and Palliative's role in collaboration with the oncology team. Concept of Palliative Care was introduced as specialized medical care for people and their families living with serious illness.  It focuses on providing relief from the symptoms and stress of a serious illness.  The goal is to improve quality of life for both the patient and the family. Values and goals of care important to patient and family were attempted to be elicited.    We discussed *** current illness and what it means in the larger context of *** on-going co-morbidities. Natural disease trajectory and expectations were discussed.  I discussed the importance of continued conversation with family and their medical providers regarding overall plan of care and treatment options, ensuring decisions are within the context of the patients values and GOCs.  PLAN: Established therapeutic relationship. Education provided on palliative's role in collaboration with their Oncology/Radiation team. I will plan to see patient back in 2-4 weeks in collaboration to other oncology appointments.    Patient expressed understanding and was in agreement with this plan. She also understands that She can call the clinic at any time with any questions, concerns, or complaints.   Thank you for your referral and  allowing Palliative to assist in Mrs. Rudene Christians Hartig's care.   Number and complexity of problems addressed: ***HIGH - 1 or more chronic illnesses with SEVERE exacerbation, progression, or side effects of treatment - advanced cancer, pain. Any controlled substances utilized were prescribed in the context of palliative care.   Visit consisted of counseling and education dealing with the complex and emotionally intense issues of symptom management and palliative care in the setting of serious and potentially life-threatening illness.  Signed by: Willette Alma, AGPCNP-BC Palliative Medicine Team/[facility]   *Please note that this is a verbal dictation therefore any spelling or grammatical errors are due to the "Dragon Medical One" system interpretation.

## 2022-12-15 ENCOUNTER — Other Ambulatory Visit: Payer: Self-pay | Admitting: *Deleted

## 2022-12-15 ENCOUNTER — Ambulatory Visit
Admission: RE | Admit: 2022-12-15 | Discharge: 2022-12-15 | Discharge status: Home / Self Care | Payer: BC Managed Care – PPO | Source: Ambulatory Visit | Attending: Radiation Oncology

## 2022-12-15 ENCOUNTER — Other Ambulatory Visit: Payer: Self-pay

## 2022-12-15 DIAGNOSIS — M797 Fibromyalgia: Secondary | ICD-10-CM | POA: Diagnosis not present

## 2022-12-15 DIAGNOSIS — C787 Secondary malignant neoplasm of liver and intrahepatic bile duct: Secondary | ICD-10-CM | POA: Diagnosis not present

## 2022-12-15 DIAGNOSIS — C4359 Malignant melanoma of other part of trunk: Secondary | ICD-10-CM | POA: Diagnosis not present

## 2022-12-15 DIAGNOSIS — Z510 Encounter for antineoplastic radiation therapy: Secondary | ICD-10-CM | POA: Diagnosis not present

## 2022-12-15 DIAGNOSIS — K5900 Constipation, unspecified: Secondary | ICD-10-CM | POA: Diagnosis not present

## 2022-12-15 DIAGNOSIS — Z5112 Encounter for antineoplastic immunotherapy: Secondary | ICD-10-CM | POA: Diagnosis not present

## 2022-12-15 DIAGNOSIS — C439 Malignant melanoma of skin, unspecified: Secondary | ICD-10-CM | POA: Diagnosis not present

## 2022-12-15 DIAGNOSIS — N289 Disorder of kidney and ureter, unspecified: Secondary | ICD-10-CM | POA: Diagnosis not present

## 2022-12-15 DIAGNOSIS — C771 Secondary and unspecified malignant neoplasm of intrathoracic lymph nodes: Secondary | ICD-10-CM | POA: Diagnosis not present

## 2022-12-15 DIAGNOSIS — C7931 Secondary malignant neoplasm of brain: Secondary | ICD-10-CM | POA: Diagnosis not present

## 2022-12-15 DIAGNOSIS — Z803 Family history of malignant neoplasm of breast: Secondary | ICD-10-CM | POA: Diagnosis not present

## 2022-12-15 DIAGNOSIS — Z79899 Other long term (current) drug therapy: Secondary | ICD-10-CM | POA: Diagnosis not present

## 2022-12-15 DIAGNOSIS — I10 Essential (primary) hypertension: Secondary | ICD-10-CM | POA: Diagnosis not present

## 2022-12-15 DIAGNOSIS — C7802 Secondary malignant neoplasm of left lung: Secondary | ICD-10-CM | POA: Diagnosis not present

## 2022-12-15 DIAGNOSIS — K219 Gastro-esophageal reflux disease without esophagitis: Secondary | ICD-10-CM | POA: Diagnosis not present

## 2022-12-15 DIAGNOSIS — C7951 Secondary malignant neoplasm of bone: Secondary | ICD-10-CM | POA: Diagnosis not present

## 2022-12-15 DIAGNOSIS — Z801 Family history of malignant neoplasm of trachea, bronchus and lung: Secondary | ICD-10-CM | POA: Diagnosis not present

## 2022-12-15 DIAGNOSIS — G893 Neoplasm related pain (acute) (chronic): Secondary | ICD-10-CM | POA: Diagnosis not present

## 2022-12-15 DIAGNOSIS — Z79620 Long term (current) use of immunosuppressive biologic: Secondary | ICD-10-CM | POA: Diagnosis not present

## 2022-12-15 DIAGNOSIS — Z87891 Personal history of nicotine dependence: Secondary | ICD-10-CM | POA: Diagnosis not present

## 2022-12-15 DIAGNOSIS — E7800 Pure hypercholesterolemia, unspecified: Secondary | ICD-10-CM | POA: Diagnosis not present

## 2022-12-15 LAB — RAD ONC ARIA SESSION SUMMARY
Course Elapsed Days: 1
Plan Fractions Treated to Date: 2
Plan Prescribed Dose Per Fraction: 3 Gy
Plan Total Fractions Prescribed: 10
Plan Total Prescribed Dose: 30 Gy
Reference Point Dosage Given to Date: 6 Gy
Reference Point Session Dosage Given: 3 Gy
Session Number: 2

## 2022-12-15 MED ORDER — ONDANSETRON HCL 8 MG PO TABS: 8.0000 mg | ORAL_TABLET | Freq: Three times a day (TID) | ORAL | 3 refills | Status: DC | PRN

## 2022-12-16 ENCOUNTER — Inpatient Hospital Stay (HOSPITAL_BASED_OUTPATIENT_CLINIC_OR_DEPARTMENT_OTHER): Payer: BC Managed Care – PPO | Admitting: Nurse Practitioner

## 2022-12-16 ENCOUNTER — Ambulatory Visit
Admission: RE | Admit: 2022-12-16 | Discharge: 2022-12-16 | Disposition: A | Discharge status: Home / Self Care | Payer: BC Managed Care – PPO | Source: Ambulatory Visit | Attending: Radiation Oncology | Admitting: Radiation Oncology

## 2022-12-16 ENCOUNTER — Encounter: Payer: Self-pay | Admitting: Nurse Practitioner

## 2022-12-16 ENCOUNTER — Other Ambulatory Visit: Payer: Self-pay

## 2022-12-16 ENCOUNTER — Other Ambulatory Visit (HOSPITAL_COMMUNITY): Payer: Self-pay

## 2022-12-16 ENCOUNTER — Encounter (HOSPITAL_COMMUNITY): Payer: Self-pay | Admitting: General Practice

## 2022-12-16 VITALS — BP 101/66 | HR 140 | Temp 97.3°F | Resp 18 | Wt 132.1 lb

## 2022-12-16 DIAGNOSIS — Z79620 Long term (current) use of immunosuppressive biologic: Secondary | ICD-10-CM | POA: Diagnosis not present

## 2022-12-16 DIAGNOSIS — Z803 Family history of malignant neoplasm of breast: Secondary | ICD-10-CM | POA: Diagnosis not present

## 2022-12-16 DIAGNOSIS — I10 Essential (primary) hypertension: Secondary | ICD-10-CM | POA: Diagnosis not present

## 2022-12-16 DIAGNOSIS — C787 Secondary malignant neoplasm of liver and intrahepatic bile duct: Secondary | ICD-10-CM | POA: Diagnosis not present

## 2022-12-16 DIAGNOSIS — K5900 Constipation, unspecified: Secondary | ICD-10-CM | POA: Diagnosis not present

## 2022-12-16 DIAGNOSIS — K219 Gastro-esophageal reflux disease without esophagitis: Secondary | ICD-10-CM | POA: Diagnosis not present

## 2022-12-16 DIAGNOSIS — C771 Secondary and unspecified malignant neoplasm of intrathoracic lymph nodes: Secondary | ICD-10-CM | POA: Diagnosis not present

## 2022-12-16 DIAGNOSIS — R630 Anorexia: Secondary | ICD-10-CM

## 2022-12-16 DIAGNOSIS — C7951 Secondary malignant neoplasm of bone: Secondary | ICD-10-CM | POA: Diagnosis not present

## 2022-12-16 DIAGNOSIS — Z510 Encounter for antineoplastic radiation therapy: Secondary | ICD-10-CM | POA: Diagnosis not present

## 2022-12-16 DIAGNOSIS — R634 Abnormal weight loss: Secondary | ICD-10-CM

## 2022-12-16 DIAGNOSIS — Z5112 Encounter for antineoplastic immunotherapy: Secondary | ICD-10-CM | POA: Diagnosis not present

## 2022-12-16 DIAGNOSIS — Z515 Encounter for palliative care: Secondary | ICD-10-CM | POA: Diagnosis not present

## 2022-12-16 DIAGNOSIS — C439 Malignant melanoma of skin, unspecified: Secondary | ICD-10-CM

## 2022-12-16 DIAGNOSIS — G893 Neoplasm related pain (acute) (chronic): Secondary | ICD-10-CM | POA: Diagnosis not present

## 2022-12-16 DIAGNOSIS — K5903 Drug induced constipation: Secondary | ICD-10-CM | POA: Diagnosis not present

## 2022-12-16 DIAGNOSIS — N289 Disorder of kidney and ureter, unspecified: Secondary | ICD-10-CM | POA: Diagnosis not present

## 2022-12-16 DIAGNOSIS — C7802 Secondary malignant neoplasm of left lung: Secondary | ICD-10-CM | POA: Diagnosis not present

## 2022-12-16 DIAGNOSIS — Z87891 Personal history of nicotine dependence: Secondary | ICD-10-CM | POA: Diagnosis not present

## 2022-12-16 DIAGNOSIS — M797 Fibromyalgia: Secondary | ICD-10-CM | POA: Diagnosis not present

## 2022-12-16 DIAGNOSIS — Z801 Family history of malignant neoplasm of trachea, bronchus and lung: Secondary | ICD-10-CM | POA: Diagnosis not present

## 2022-12-16 DIAGNOSIS — Z79899 Other long term (current) drug therapy: Secondary | ICD-10-CM | POA: Diagnosis not present

## 2022-12-16 DIAGNOSIS — E7800 Pure hypercholesterolemia, unspecified: Secondary | ICD-10-CM | POA: Diagnosis not present

## 2022-12-16 DIAGNOSIS — C4359 Malignant melanoma of other part of trunk: Secondary | ICD-10-CM | POA: Diagnosis not present

## 2022-12-16 DIAGNOSIS — C7931 Secondary malignant neoplasm of brain: Secondary | ICD-10-CM | POA: Diagnosis not present

## 2022-12-16 LAB — RAD ONC ARIA SESSION SUMMARY
Course Elapsed Days: 2
Plan Fractions Treated to Date: 3
Plan Prescribed Dose Per Fraction: 3 Gy
Plan Total Fractions Prescribed: 10
Plan Total Prescribed Dose: 30 Gy
Reference Point Dosage Given to Date: 9 Gy
Reference Point Session Dosage Given: 3 Gy
Session Number: 3

## 2022-12-16 MED ORDER — SUCRALFATE 1 G PO TABS
1.0000 g | ORAL_TABLET | Freq: Three times a day (TID) | ORAL | 1 refills | Status: DC
  Filled 2022-12-16: qty 90, 23d supply, fill #0

## 2022-12-16 MED ORDER — MAGNESIUM CITRATE PO SOLN
ORAL | 0 refills | Status: DC
  Filled 2022-12-16: qty 195, fill #0

## 2022-12-16 MED ORDER — HYDROMORPHONE HCL 2 MG PO TABS
2.0000 mg | ORAL_TABLET | Freq: Four times a day (QID) | ORAL | 0 refills | Status: DC | PRN
  Filled 2022-12-16: qty 45, 12d supply, fill #0

## 2022-12-16 MED ORDER — PANTOPRAZOLE SODIUM 40 MG PO TBEC
40.0000 mg | DELAYED_RELEASE_TABLET | Freq: Every day | ORAL | 2 refills | Status: DC
  Filled 2022-12-16: qty 30, 30d supply, fill #0

## 2022-12-16 MED ORDER — MORPHINE SULFATE ER 30 MG PO TBCR
30.0000 mg | EXTENDED_RELEASE_TABLET | Freq: Two times a day (BID) | ORAL | 0 refills | Status: DC
  Filled 2022-12-16: qty 60, 30d supply, fill #0

## 2022-12-16 MED ORDER — DRONABINOL 2.5 MG PO CAPS
2.5000 mg | ORAL_CAPSULE | Freq: Two times a day (BID) | ORAL | 0 refills | Status: DC
  Filled 2022-12-16: qty 30, 15d supply, fill #0

## 2022-12-16 MED ORDER — POLYETHYLENE GLYCOL 3350 17 G PO PACK: 17.0000 g | PACK | Freq: Two times a day (BID) | ORAL | Status: DC

## 2022-12-16 NOTE — Anesthesia Preprocedure Evaluation (Addendum)
Anesthesia Evaluation  Patient identified by MRN, date of birth, ID band Patient awake    Reviewed: Allergy & Precautions, NPO status , Patient's Chart, lab work & pertinent test results  History of Anesthesia Complications (+) PONV and history of anesthetic complications  Airway Mallampati: II  TM Distance: >3 FB Neck ROM: Full    Dental  (+) Missing, Edentulous Upper   Pulmonary former smoker   Pulmonary exam normal breath sounds clear to auscultation       Cardiovascular negative cardio ROS Normal cardiovascular exam Rhythm:Regular Rate:Normal     Neuro/Psych  Headaches  Anxiety     Vertebral artery disease     GI/Hepatic Neg liver ROS,GERD  Medicated and Controlled,,  Endo/Other  negative endocrine ROS    Renal/GU negative Renal ROS     Musculoskeletal  (+)  Fibromyalgia -  Abdominal   Peds  Hematology negative hematology ROS (+)   Anesthesia Other Findings Metastatic melanoma  Reproductive/Obstetrics                              Anesthesia Physical Anesthesia Plan  ASA: 3  Anesthesia Plan: General   Post-op Pain Management:    Induction: Intravenous  PONV Risk Score and Plan: 4 or greater and Midazolam, Dexamethasone, Ondansetron and Treatment may vary due to age or medical condition  Airway Management Planned: Oral ETT  Additional Equipment:   Intra-op Plan:   Post-operative Plan: Extubation in OR  Informed Consent: I have reviewed the patients History and Physical, chart, labs and discussed the procedure including the risks, benefits and alternatives for the proposed anesthesia with the patient or authorized representative who has indicated his/her understanding and acceptance.     Dental advisory given  Plan Discussed with: CRNA  Anesthesia Plan Comments:          Anesthesia Quick Evaluation

## 2022-12-16 NOTE — Progress Notes (Signed)
SDW CALL  Patient was given pre-op instructions over the phone. The opportunity was given for the patient to ask questions. No further questions asked. Patient verbalized understanding of instructions given.   Cardiologist - denies  PPM/ICD -  Device Orders -  Rep Notified -   Chest x-ray - 11/16/22 EKG - 11/16/22 Stress Test - denies ECHO - denies Cardiac Cath - denies  Sleep Study - denies CPAP - n/a  Fasting Blood Sugar - n/a  Last dose of GLP1 agonist-  n/a GLP1 instructions: n/a  Blood Thinner Instructions: n/a Aspirin Instructions:n/a   COVID TEST- n/a   Anesthesia review:   Patient denies shortness of breath, fever, cough and chest pain over the phone call   All instructions explained to the patient, with a verbal understanding of the material. Patient agrees to go over the instructions while at home for a better understanding.

## 2022-12-16 NOTE — Patient Instructions (Addendum)
-   start your marinol (dronabinol) for your appetite, take 30 mins before a meal, 2 times a day - continue your lyrica (pregabalin) 3 times a day - continue with your dilaudid 2mg  every 6 hours as needed for pain  - start your Protonix once a day for indigestion - start taking Carafate 4 times a day 30 mins before meals to help with indigestion  - start taking your MS Contin, long acting morphine, for extended pain relief, take this every 12 hours regardless of pain level - also pick up some magnesium citrate (over the counter) take 1/2 of the bottle to help with constipation, if no bowel movement in 4 hours take the other half of the bottle - start taking Mirilax twice daily to help with constipation - if you have any questions or concerns call the office at [phone], also call 2-3 days prior to needing a refill to ensure you do not go without your medications

## 2022-12-17 ENCOUNTER — Ambulatory Visit: Payer: BC Managed Care – PPO

## 2022-12-17 ENCOUNTER — Ambulatory Visit (HOSPITAL_COMMUNITY)
Admission: RE | Admit: 2022-12-17 | Discharge: 2022-12-17 | Disposition: A | Discharge status: Home / Self Care | Payer: BC Managed Care – PPO | Source: Ambulatory Visit | Attending: Family Medicine | Admitting: Family Medicine

## 2022-12-17 ENCOUNTER — Ambulatory Visit (HOSPITAL_COMMUNITY)
Admission: RE | Admit: 2022-12-17 | Discharge: 2022-12-17 | Disposition: A | Discharge status: Home / Self Care | Payer: BC Managed Care – PPO | Source: Ambulatory Visit

## 2022-12-17 ENCOUNTER — Other Ambulatory Visit (HOSPITAL_COMMUNITY): Payer: Self-pay

## 2022-12-17 ENCOUNTER — Ambulatory Visit (HOSPITAL_COMMUNITY)
Admission: RE | Admit: 2022-12-17 | Discharge: 2022-12-17 | Disposition: A | Discharge status: Home / Self Care | Payer: BC Managed Care – PPO

## 2022-12-17 ENCOUNTER — Encounter (HOSPITAL_COMMUNITY)
Admission: RE | Disposition: A | Discharge status: Home / Self Care | Payer: Self-pay | Source: Home / Self Care | Attending: *Deleted

## 2022-12-17 ENCOUNTER — Ambulatory Visit (HOSPITAL_COMMUNITY): Payer: BC Managed Care – PPO

## 2022-12-17 ENCOUNTER — Encounter (HOSPITAL_COMMUNITY): Payer: Self-pay | Admitting: *Deleted

## 2022-12-17 ENCOUNTER — Other Ambulatory Visit: Payer: Self-pay

## 2022-12-17 ENCOUNTER — Ambulatory Visit (HOSPITAL_COMMUNITY): Payer: Self-pay

## 2022-12-17 ENCOUNTER — Ambulatory Visit
Admission: RE | Admit: 2022-12-17 | Discharge: 2022-12-17 | Disposition: A | Discharge status: Home / Self Care | Payer: BC Managed Care – PPO | Source: Ambulatory Visit | Attending: Radiation Oncology | Admitting: Radiation Oncology

## 2022-12-17 DIAGNOSIS — Z79899 Other long term (current) drug therapy: Secondary | ICD-10-CM | POA: Diagnosis not present

## 2022-12-17 DIAGNOSIS — G893 Neoplasm related pain (acute) (chronic): Secondary | ICD-10-CM | POA: Diagnosis not present

## 2022-12-17 DIAGNOSIS — K5900 Constipation, unspecified: Secondary | ICD-10-CM | POA: Diagnosis not present

## 2022-12-17 DIAGNOSIS — Z803 Family history of malignant neoplasm of breast: Secondary | ICD-10-CM | POA: Diagnosis not present

## 2022-12-17 DIAGNOSIS — I10 Essential (primary) hypertension: Secondary | ICD-10-CM | POA: Diagnosis not present

## 2022-12-17 DIAGNOSIS — C7951 Secondary malignant neoplasm of bone: Secondary | ICD-10-CM | POA: Insufficient documentation

## 2022-12-17 DIAGNOSIS — C787 Secondary malignant neoplasm of liver and intrahepatic bile duct: Secondary | ICD-10-CM | POA: Diagnosis not present

## 2022-12-17 DIAGNOSIS — Z87891 Personal history of nicotine dependence: Secondary | ICD-10-CM | POA: Diagnosis not present

## 2022-12-17 DIAGNOSIS — C7802 Secondary malignant neoplasm of left lung: Secondary | ICD-10-CM | POA: Diagnosis not present

## 2022-12-17 DIAGNOSIS — R200 Anesthesia of skin: Secondary | ICD-10-CM

## 2022-12-17 DIAGNOSIS — C438 Malignant melanoma of overlapping sites of skin: Secondary | ICD-10-CM

## 2022-12-17 DIAGNOSIS — Z801 Family history of malignant neoplasm of trachea, bronchus and lung: Secondary | ICD-10-CM | POA: Diagnosis not present

## 2022-12-17 DIAGNOSIS — C771 Secondary and unspecified malignant neoplasm of intrathoracic lymph nodes: Secondary | ICD-10-CM | POA: Diagnosis not present

## 2022-12-17 DIAGNOSIS — F17210 Nicotine dependence, cigarettes, uncomplicated: Secondary | ICD-10-CM | POA: Diagnosis not present

## 2022-12-17 DIAGNOSIS — Z510 Encounter for antineoplastic radiation therapy: Secondary | ICD-10-CM | POA: Diagnosis not present

## 2022-12-17 DIAGNOSIS — C4359 Malignant melanoma of other part of trunk: Secondary | ICD-10-CM | POA: Diagnosis not present

## 2022-12-17 DIAGNOSIS — C718 Malignant neoplasm of overlapping sites of brain: Secondary | ICD-10-CM | POA: Diagnosis not present

## 2022-12-17 DIAGNOSIS — G939 Disorder of brain, unspecified: Secondary | ICD-10-CM | POA: Diagnosis not present

## 2022-12-17 DIAGNOSIS — Z79620 Long term (current) use of immunosuppressive biologic: Secondary | ICD-10-CM | POA: Diagnosis not present

## 2022-12-17 DIAGNOSIS — C7931 Secondary malignant neoplasm of brain: Secondary | ICD-10-CM | POA: Diagnosis not present

## 2022-12-17 DIAGNOSIS — K219 Gastro-esophageal reflux disease without esophagitis: Secondary | ICD-10-CM | POA: Diagnosis not present

## 2022-12-17 DIAGNOSIS — C720 Malignant neoplasm of spinal cord: Secondary | ICD-10-CM | POA: Diagnosis not present

## 2022-12-17 DIAGNOSIS — M797 Fibromyalgia: Secondary | ICD-10-CM | POA: Diagnosis not present

## 2022-12-17 DIAGNOSIS — Z5112 Encounter for antineoplastic immunotherapy: Secondary | ICD-10-CM | POA: Diagnosis not present

## 2022-12-17 DIAGNOSIS — C439 Malignant melanoma of skin, unspecified: Secondary | ICD-10-CM | POA: Diagnosis not present

## 2022-12-17 DIAGNOSIS — N289 Disorder of kidney and ureter, unspecified: Secondary | ICD-10-CM | POA: Diagnosis not present

## 2022-12-17 DIAGNOSIS — E7800 Pure hypercholesterolemia, unspecified: Secondary | ICD-10-CM | POA: Diagnosis not present

## 2022-12-17 HISTORY — PX: RADIOLOGY WITH ANESTHESIA: SHX6223

## 2022-12-17 LAB — RAD ONC ARIA SESSION SUMMARY
Course Elapsed Days: 3
Plan Fractions Treated to Date: 4
Plan Prescribed Dose Per Fraction: 3 Gy
Plan Total Fractions Prescribed: 10
Plan Total Prescribed Dose: 30 Gy
Reference Point Dosage Given to Date: 12 Gy
Reference Point Session Dosage Given: 3 Gy
Session Number: 4

## 2022-12-17 SURGERY — MRI WITH ANESTHESIA
Anesthesia: General

## 2022-12-17 MED ORDER — LACTATED RINGERS IV SOLN
INTRAVENOUS | Status: DC
Start: 2022-12-17 — End: 2022-12-18

## 2022-12-17 MED ORDER — ORAL CARE MOUTH RINSE: 15.0000 mL | Freq: Once | OROMUCOSAL | Status: AC

## 2022-12-17 MED ORDER — SUCCINYLCHOLINE CHLORIDE 200 MG/10ML IV SOSY
PREFILLED_SYRINGE | INTRAVENOUS | Status: DC | PRN
  Administered 2022-12-17: 60 mg via INTRAVENOUS

## 2022-12-17 MED ORDER — PHENYLEPHRINE HCL-NACL 20-0.9 MG/250ML-% IV SOLN
INTRAVENOUS | Status: DC | PRN
  Administered 2022-12-17: 30 ug/min via INTRAVENOUS

## 2022-12-17 MED ORDER — ONDANSETRON HCL 4 MG/2ML IJ SOLN
INTRAMUSCULAR | Status: DC | PRN
  Administered 2022-12-17: 4 mg via INTRAVENOUS

## 2022-12-17 MED ORDER — DEXAMETHASONE SODIUM PHOSPHATE 10 MG/ML IJ SOLN
INTRAMUSCULAR | Status: DC | PRN
  Administered 2022-12-17: 10 mg via INTRAVENOUS

## 2022-12-17 MED ORDER — FENTANYL CITRATE (PF) 250 MCG/5ML IJ SOLN
INTRAMUSCULAR | Status: DC | PRN
  Administered 2022-12-17 (×2): 50 ug via INTRAVENOUS

## 2022-12-17 MED ORDER — LIDOCAINE 2% (20 MG/ML) 5 ML SYRINGE
INTRAMUSCULAR | Status: DC | PRN
  Administered 2022-12-17: 80 mg via INTRAVENOUS

## 2022-12-17 MED ORDER — FENTANYL CITRATE (PF) 100 MCG/2ML IJ SOLN
INTRAMUSCULAR | Status: AC
  Filled 2022-12-17: qty 2

## 2022-12-17 MED ORDER — GADOBUTROL 1 MMOL/ML IV SOLN
5.0000 mL | Freq: Once | INTRAVENOUS | Status: AC | PRN
  Administered 2022-12-17: 5 mL via INTRAVENOUS

## 2022-12-17 MED ORDER — CHLORHEXIDINE GLUCONATE 0.12 % MT SOLN
15.0000 mL | Freq: Once | OROMUCOSAL | Status: AC
Start: 2022-12-17 — End: 2022-12-17
  Administered 2022-12-17: 15 mL via OROMUCOSAL
  Filled 2022-12-17: qty 15

## 2022-12-17 MED ORDER — PROPOFOL 10 MG/ML IV BOLUS
INTRAVENOUS | Status: DC | PRN
  Administered 2022-12-17: 150 mg via INTRAVENOUS

## 2022-12-17 NOTE — Assessment & Plan Note (Addendum)
She has started palliative radiation Morphine 30 mg ER twice daily  Dilaudid 2mg  every 6 hours as needed for breakthrough pain. Continue Lyrica 75mg  three times a day.  Continue follow-up with palliative care

## 2022-12-17 NOTE — Anesthesia Postprocedure Evaluation (Signed)
Anesthesia Post Note  Patient: Laura [name]  Procedure(s) Performed: MRI BRAIN WITH AND WITHOUT CONTRAST, MRI LUMBAR SPINE WITH AND WITHOUT CONTRAST WITH ANESTHESIA     Patient location during evaluation: PACU Anesthesia Type: General Level of consciousness: awake and alert Pain management: pain level controlled Vital Signs Assessment: post-procedure vital signs reviewed and stable Respiratory status: spontaneous breathing, nonlabored ventilation, respiratory function stable and patient connected to nasal cannula oxygen Cardiovascular status: blood pressure returned to baseline and stable Postop Assessment: no apparent nausea or vomiting Anesthetic complications: no Comments: Vital signs back to baseline. Stable for discharge   There were no known notable events for this encounter.  Last Vitals:  Vitals:   12/17/22 1000 12/17/22 1005  BP: (!) 89/68 98/66  Pulse: (!) 128 (!) 120  Resp: 19 13  Temp:  36.9 C  SpO2: 95% 93%    Last Pain:  Vitals:   12/17/22 1000  TempSrc:   PainSc: 0-No pain                 [place]ott Nation

## 2022-12-17 NOTE — Assessment & Plan Note (Signed)
Extensive metastases with large burden.   Will continue ipi/nivo as able MRI brain showed metastases. Continue ipi/nivo as she is asymptomatic Request BRAF status Referral for palliative radiation for pain from bone metastases

## 2022-12-17 NOTE — Transfer of Care (Signed)
Immediate Anesthesia Transfer of Care Note  Patient: Laura [name]  Procedure(s) Performed: MRI BRAIN WITH AND WITHOUT CONTRAST, MRI LUMBAR SPINE WITH AND WITHOUT CONTRAST WITH ANESTHESIA  Patient Location: PACU  Anesthesia Type:General  Level of Consciousness: awake, alert , and oriented  Airway & Oxygen Therapy: Patient Spontanous Breathing and Patient connected to nasal cannula oxygen  Post-op Assessment: Report given to RN and Post -op Vital signs reviewed and stable  Post vital signs: Reviewed and stable  Last Vitals:  Vitals Value Taken Time  BP 100/67 12/17/22 0935  Temp 36.9 C 12/17/22 0935  Pulse 119 12/17/22 0941  Resp 12 12/17/22 0941  SpO2 93 % 12/17/22 0941  Vitals shown include unfiled device data.  Last Pain:  Vitals:   12/17/22 0935  TempSrc:   PainSc: Asleep      Patients Stated Pain Goal: 1 (12/17/22 9562)  Complications: No notable events documented.

## 2022-12-17 NOTE — Anesthesia Procedure Notes (Signed)
Procedure Name: Intubation Date/Time: 12/17/2022 8:07 AM  Performed by: Allyn Kenner, CRNAPre-anesthesia Checklist: Patient identified, Emergency Drugs available, Suction available and Patient being monitored Patient Re-evaluated:Patient Re-evaluated prior to induction Oxygen Delivery Method: Circle System Utilized Preoxygenation: Pre-oxygenation with 100% oxygen Induction Type: IV induction Ventilation: Mask ventilation without difficulty Laryngoscope Size: Glidescope and 3 Grade View: Grade I Tube type: Oral Tube size: 7.0 mm Number of attempts: 1 Airway Equipment and Method: Stylet and Oral airway Placement Confirmation: ETT inserted through vocal cords under direct vision, positive ETCO2 and breath sounds checked- equal and bilateral Secured at: 21 cm Tube secured with: Tape Dental Injury: Teeth and Oropharynx as per pre-operative assessment

## 2022-12-17 NOTE — Progress Notes (Signed)
Patient Care Team: [name], [name] as PCP - General (Family Medicine) Pickenpack-Cousar, Arty Baumgartner, NP as Nurse Practitioner [facility])  Clinic Day:  12/18/2022  Referring physician: [name], [name]  ASSESSMENT & PLAN:   Assessment & Plan: Metastatic malignant melanoma [facility]) Extensive metastases with large burden.   Will continue ipi/nivo as able MRI brain showed metastases. Continue ipi/nivo as she is asymptomatic Request BRAF status Referral for palliative radiation for pain from bone metastases  Cancer associated pain She has started palliative radiation Morphine 30 mg ER twice daily  Dilaudid 2mg  every 6 hours as needed for breakthrough pain. Continue Lyrica 75mg  three times a day.  Continue follow-up with palliative care  Constipation Add benefiber On Miralax as needed and soft softener.  Increased fluid.   Brain metastases Asymptomatic small intracranial mets. Will continue ipi/nivo Follow up MRI in about 8 weeks and SRS protocol if no response  The patient understands the plans discussed today and is in agreement with them.  She knows to contact our office if she develops concerns prior to her next appointment.  Melven Sartorius, MD  [facility] - A DEPT OF [facility] [address] [place] [address] Dept: [phone] Dept Fax: [phone]   Orders Placed This Encounter  Procedures   MR Brain W Wo Contrast    Standing Status:   Future    Standing Expiration Date:   12/18/2023    Scheduling Instructions:     Needs sedation like last time    Order Specific Question:   If indicated for the ordered procedure, I authorize the administration of contrast media per Radiology protocol    Answer:   Yes    Order Specific Question:   What is the patient's sedation requirement?    Answer:   General Anesthesia (available ONLY at [facility])    Order Specific  Question:   Does the patient have a pacemaker or implanted devices?    Answer:   No    Order Specific Question:   Use SRS Protocol?    Answer:   Yes    Order Specific Question:   Preferred imaging location?    Answer:   [facility] (table limit - 500 lbs)      CHIEF COMPLAINT:  CC: stage IV melanoma  Current Treatment:  ipi/nivo  INTERVAL HISTORY:  Laura [name] is here today for repeat clinical assessment. She denies any coughing, shortness of breath, headaches, vomiting, abdominal pain.  Pain is at the lower back and hip.  It was controlled this morning.  A bit worse after laying on the table for radiation.  She has no bowel movement yet.  She is planning on using magnesium citrate by palliative care. I have reviewed the past medical history, past surgical history, social history and family history with the patient and they are unchanged from previous note.  ALLERGIES:  is allergic to aspirin.  MEDICATIONS:  No current outpatient medications on file.   No current facility-administered medications for this visit.    HISTORY OF PRESENT ILLNESS:   Oncology History  Metastatic malignant melanoma (HCC)  11/16/2022 Imaging   She presented to the emergency room on 11/16/2022 for back pain x 2 weeks and central chest pain for several months   CTA CAP IMPRESSION: 1. Negative for aortic dissection or aneurysm. 2. 1.2 cm left upper lobe nodule.  For 3. Multiple ill-defined liver lesions, suspicious for metastatic  disease. 4. Multiple lytic osseous lesions, suspicious for metastatic disease. Pathologic fracture of the superior endplate of L5   11/25/2022 Pathology Results   1. Liver, needle/core biopsy, Left hepatic lobe lesion :       - METASTATIC MELANOMA.    12/04/2022 Initial Diagnosis   Melanoma of skin (HCC) Liver, bone metastases. 1.2 cm LUL nodule   12/09/2022 PET scan   PET: Diffuse bone and liver metastases.  Lung metastases.    12/11/2022 -  Chemotherapy    Patient is on Treatment Plan : MELANOMA Nivolumab (1) + Ipilimumab (3) q21d / Nivolumab (480) q28d     12/17/2022 Imaging   MRI brain 1. Three small right hemisphere brain metastases metastases ranging from 2 mm to 4 mm. Minimal hemosiderin. No associated cerebral edema or mass effect. No other metastatic disease identified in the brain.   2. Known osseous metastatic disease but no destructive lesion identified in the skull or visible cervical spine.   3. Moderate for age cerebral white matter changes most commonly due to small vessel disease.       REVIEW OF SYSTEMS:   All relevant systems were reviewed with the patient and are negative.   VITALS:  Blood pressure 111/75, pulse (!) 135, temperature 98 F (36.7 C), temperature source Temporal, resp. rate 16, height 5\' 3"  (1.6 m), weight 132 lb 8 oz (60.1 kg), SpO2 99%.  Wt Readings from Last 3 Encounters:  12/17/22 132 lb (59.9 kg)  12/18/22 132 lb 8 oz (60.1 kg)  12/16/22 132 lb 1.6 oz (59.9 kg)    Body mass index is 23.47 kg/m.  Performance status (ECOG): 2 - Symptomatic, <50% confined to bed  PHYSICAL EXAM:   GENERAL: alert, no distress SKIN: skin color normal, no rashes  EYES: normal, sclera clear OROPHARYNX: no exudate, no erythema    NECK: supple,  non-tender, without nodularity LYMPH:  no palpable cervical lymphadenopathy LUNGS: clear to auscultation with normal breathing effort.  No wheeze or rales HEART: regular rate & rhythm and no murmurs and no lower extremity edema ABDOMEN: abdomen soft, non-tender and nondistended Musculoskeletal: no edema NEURO: alert, fluent speech, no focal motor/sensory deficits.  Strength and sensation equal bilaterally.  LABORATORY DATA:  I have reviewed the data as listed    Component Value Date/Time   NA 136 12/09/2022 1528   K 3.8 12/09/2022 1528   CL 94 (L) 12/09/2022 1528   CO2 34 (H) 12/09/2022 1528   GLUCOSE 99 12/09/2022 1528   BUN 14 12/09/2022 1528   CREATININE  1.26 (H) 12/09/2022 1528   CALCIUM 10.7 (H) 12/09/2022 1528   PROT 7.7 12/09/2022 1528   ALBUMIN 4.3 12/09/2022 1528   AST 63 (H) 12/09/2022 1528   ALT 26 12/09/2022 1528   ALKPHOS 158 (H) 12/09/2022 1528   BILITOT 1.2 (H) 12/09/2022 1528   GFRNONAA 51 (L) 12/09/2022 1528   GFRAA >60 04/20/2018 0823    No results found for: "SPEP", "UPEP"  Lab Results  Component Value Date   WBC 10.3 12/09/2022   NEUTROABS 8.2 (H) 12/09/2022   HGB 13.8 12/09/2022   HCT 39.7 12/09/2022   MCV 92.1 12/09/2022   PLT 322 12/09/2022      Chemistry      Component Value Date/Time   NA 136 12/09/2022 1528   K 3.8 12/09/2022 1528   CL 94 (L) 12/09/2022 1528   CO2 34 (H) 12/09/2022 1528   BUN 14 12/09/2022 1528   CREATININE 1.26 (H) 12/09/2022 1528  Component Value Date/Time   CALCIUM 10.7 (H) 12/09/2022 1528   ALKPHOS 158 (H) 12/09/2022 1528   AST 63 (H) 12/09/2022 1528   ALT 26 12/09/2022 1528   BILITOT 1.2 (H) 12/09/2022 1528       RADIOGRAPHIC STUDIES: I have personally reviewed the radiological images as listed and agreed with the findings in the report. MR BRAIN W WO CONTRAST  Addendum Date: 12/17/2022   ADDENDUM REPORT: 12/17/2022 13:41 ADDENDUM: Study discussed by telephone with [name] COURTNEY WHARTON on 12/17/2022 at 1332 hours. Electronically Signed   By: Odessa Fleming M.D.   On: 12/17/2022 13:41   Result Date: 12/17/2022 CLINICAL DATA:  57 year old female with metastatic melanoma. Staging. Study under anesthesia. EXAM: MRI HEAD WITHOUT AND WITH CONTRAST TECHNIQUE: Multiplanar, multiecho pulse sequences of the brain and surrounding structures were obtained without and with intravenous contrast. CONTRAST:  5mL GADAVIST GADOBUTROL 1 MMOL/ML IV SOLN COMPARISON:  PET-CT 12/09/2022.  Head CT 11/11/2022. FINDINGS: Brain: Several small intrinsic T1 hyperintense (series 8, image 37) and/or enhancing (series 19, image 30) brain lesions compatible with small melanoma metastases. Only 1 of these  demonstrates evidence of hemosiderin on SWI (series 7, image 72). No associated cerebral edema or mass effect. Three such small metastases are identified including 3-4 mm at the right inferior frontal gyrus on series 19, image 30, 3-4 mm at the right operculum image 35, and 2-3 mm at the right middle frontal gyrus on image 38. Although those lesions are relatively clustered, no additional brain metastases, abnormal enhancement, or abnormal susceptibility is identified elsewhere. No dural thickening. No superimposed restricted diffusion to suggest acute infarction. No midline shift, ventriculomegaly, extra-axial collection or acute intracranial hemorrhage. Cervicomedullary junction and pituitary are within normal limits. Scattered nonenhancing white matter T2 and FLAIR hyperintensity is moderate for age, nonspecific but most commonly small vessel related. Vascular: Major intracranial vascular flow voids are preserved. Dominant distal left vertebral artery. Following contrast major dural venous sinuses are enhancing and appear to be patent. Skull and upper cervical spine: Heterogeneous background bone marrow signal. Known osseous metastatic disease, but no destructive lesion identified in the skull or visible cervical spine. Grossly negative visible cervical spinal cord. Sinuses/Orbits: Negative orbits. Minor paranasal sinus mucosal thickening. Other: Minimal left mastoid effusion. Visible internal auditory structures appear normal. Intubated, study under anesthesia. Negative visible scalp and face. IMPRESSION: 1. Three small right hemisphere brain metastases metastases ranging from 2 mm to 4 mm. Minimal hemosiderin. No associated cerebral edema or mass effect. No other metastatic disease identified in the brain. 2. Known osseous metastatic disease but no destructive lesion identified in the skull or visible cervical spine. 3. Moderate for age cerebral white matter changes most commonly due to small vessel disease.  Electronically Signed: By: Odessa Fleming M.D. On: 12/17/2022 13:16   MR LUMBAR SPINE W WO CONTRAST  Addendum Date: 12/17/2022   ADDENDUM REPORT: 12/17/2022 13:40 ADDENDUM: Study discussed by telephone with [name] COURTNEY WHARTON on 12/17/2022 at 1332 hours. And note that CLINICAL DATA should read 57 year old female with metastatic melanoma. Staging. Study under anesthesia. Electronically Signed   By: Odessa Fleming M.D.   On: 12/17/2022 13:40   Result Date: 12/17/2022 EXAM: MRI LUMBAR SPINE WITHOUT AND WITH CONTRAST TECHNIQUE: Multiplanar and multiecho pulse sequences of the lumbar spine were obtained without and with intravenous contrast. CONTRAST:  5mL GADAVIST GADOBUTROL 1 MMOL/ML IV SOLN COMPARISON:  Brain MRI today reported separately. PET-CT 12/09/2022. CTA chest abdomen pelvis 11/16/2022. FINDINGS: Segmentation:  Normal on the comparison  CTA. Alignment:  Mild straightening of lumbar lordosis now. Vertebrae: Diffuse skeletal metastatic disease throughout the visible spine and pelvis. Scout view demonstrates thoracic vertebral metastases with extension into the ventral epidural space at both the T4 and T8 levels (series 10, image 14). Pathologic fracture of the L5 vertebral body has progressed since 11/16/2022. L5 loss of height is now up to 60%, and there is retropulsed and/or epidural extension of tumor at that level (series 16, image 31), see additional spinal canal details below. Lumbar ventral epidural tumor also at the left L1 vertebral level without spinal stenosis (series 15, image 17), and at the L4 level on the right (series 15, image 25) without spinal stenosis. Incidental bulky extraosseous tumor also from the right L1 transverse process, 2.2 cm right paraspinal muscle mass there on series 15, image 7. No other pathologic fracture identified. Conus medullaris and cauda equina: Conus extends to the L1 level. No lower spinal cord or conus signal abnormality. But there is an indistinct appearance of the  surface of the lower thoracic spinal cord on T2 imaging (series 12, image 8), and indeterminate enhancement pattern at the conus (series 17, image 7). No obvious nerve root thickening or enhancement in the cauda equina. Paraspinal and other soft tissues: Right L1 paraspinal muscle tumor arising from the posterior elements at that level as above. Partially visible extensive liver tumor. No retroperitoneal, prevertebral lymphadenopathy. Disc levels: No significant lumbar spine degeneration superimposed on the diffuse metastatic disease. Ventral epidural tumor at L2 and L4 without spinal stenosis as above. Bulky tumor retropulsion, epidural extension at the L5 pathologic compression fracture level now resulting in severe spinal stenosis (series 15, image 31 and series 12, image 8), severe lateral recess stenosis (S1 nerve levels) and severe proximal L5 foraminal stenosis. IMPRESSION: 1. Diffuse skeletal metastatic disease. Involvement throughout the visible spine and pelvis. 2. Progressed pathologic fracture of the L5 since 10/21/2024with 60% loss of height and bulky retropulsed/epidural tumor. 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 3. 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 And scout view demonstrates thoracic spine ventral epidural tumor at both T4 and T8 levels. 4. 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 Electronically Signed: By: Odessa Fleming M.D. On: 12/17/2022 13:28   NM PET Image Initial (PI) Whole Body  Result Date: 12/09/2022 CLINICAL DATA:  Initial treatment strategy for metastatic melanoma. EXAM: NUCLEAR MEDICINE PET WHOLE BODY TECHNIQUE: 6.6 mCi F-18 FDG was injected intravenously. Full-ring PET imaging was performed from the head to foot after the radiotracer. CT data was obtained and used for attenuation correction and anatomic localization. Fasting blood glucose: 115 mg/dl COMPARISON:  CTA chest abdomen  pelvis dated 11/16/2022 FINDINGS: Mediastinal blood pool activity: SUV max 2.1 HEAD/NECK: No hypermetabolic activity in the scalp. No hypermetabolic cervical lymph nodes. Incidental CT findings: none CHEST: 14 mm posterior left upper lobe pulmonary nodule (series 7/image 20), max SUV 8.7. 11 mm short axis left AP window node (series 4/image 82), max SUV 11.2. Incidental CT findings: Mild atherosclerotic calcifications of the aortic arch. Mild coronary atherosclerosis of the LAD. Mild centrilobular and paraseptal emphysematous changes, upper lung predominant. ABDOMEN/PELVIS: Multifocal hepatic metastases, approximately 10-15 in number, including: --7.4 cm mass in the lateral segment left hepatic lobe (series 4/image 112), max SUV 16.3 --2.9 cm mass in the medial aspect of segment 6 (series 4/image 122), max SUV 14.7 No abnormal hypermetabolism in the pancreas, spleen, or adrenal glands. No hypermetabolic abdominopelvic  lymphadenopathy. Incidental CT findings: Status post cholecystectomy. Status post hysterectomy. Mild rectosigmoid colonic wall thickening (series 4/image 135), nonspecific. Atherosclerotic calcifications of the abdominal aorta and branch vessels. SKELETON: Multifocal/widespread osseous metastases, including: --Left humeral head, max SUV 6.4 --T4 vertebral body, max SUV 21.1 --Right lateral 6th rib, max SUV 4.2 --Right inferior sternum, max SUV 12.5 --L4 vertebral body, max SUV 16.3 --Left sacrum, max SUV 15.4 Hypermetabolism involving right anterior thigh musculature, favored to be physiologic. Incidental CT findings: none EXTREMITIES: No abnormal hypermetabolic activity in the lower extremities. Incidental CT findings: none IMPRESSION: Left upper lobe pulmonary metastasis, as above. Associated mediastinal nodal metastasis. Multifocal hepatic metastases, as above. Multifocal/widespread osseous metastases, as above. Electronically Signed   By: Charline Bills M.D.   On: 12/09/2022 23:34   US BIOPSY  (LIVER)  Result Date: 11/25/2022 INDICATION: abnormal CT scan 11/16/2022 concerning for metastatic disease.; Multiple liver lesions EXAM: Ultrasound-guided core needle biopsy of focal liver lesion MEDICATIONS: None. ANESTHESIA/SEDATION: Moderate (conscious) sedation was employed during this procedure. A total of Versed 2 mg and Fentanyl 100 mcg was administered intravenously by the radiology nurse. Total intra-service moderate Sedation Time: 13 minutes. The patient's level of consciousness and vital signs were monitored continuously by radiology nursing throughout the procedure under my direct supervision. COMPLICATIONS: None immediate. PROCEDURE: Informed written consent was obtained from the patient after a thorough discussion of the procedural risks, benefits and alternatives. All questions were addressed. Maximal Sterile Barrier Technique was utilized including caps, mask, sterile gowns, sterile gloves, sterile drape, hand hygiene and skin antiseptic. A timeout was performed prior to the initiation of the procedure. The patient was placed supine on the exam table. Ultrasound of the liver demonstrated multiple focal lesions, the largest of which appeared in the left hepatic lobe, estimated approximately 4 cm in size. Skin entry site was marked, and the overlying skin was prepped draped in the standard sterile fashion. Local analgesia was obtained with 1% lidocaine. Using ultrasound guidance, a 17 gauge introducer needle was advanced towards the identified lesion in the left hepatic lobe. Subsequently, core needle biopsy was performed of the left hepatic lobe lesion using an 18 gauge core biopsy device x5 total passes. Specimens were submitted in formalin to pathology for further handling. Limited postprocedure imaging demonstrated no hematoma. A clean dressing was placed after manual hemostasis. The patient tolerated the procedure well without immediate complication. IMPRESSION: Successful ultrasound-guided  core needle biopsy of focal lesion in the left hepatic lobe. Electronically Signed   By: Olive Bass M.D.   On: 11/25/2022 15:33

## 2022-12-18 ENCOUNTER — Other Ambulatory Visit: Payer: Self-pay

## 2022-12-18 ENCOUNTER — Ambulatory Visit
Admission: RE | Admit: 2022-12-18 | Discharge: 2022-12-18 | Disposition: A | Discharge status: Home / Self Care | Payer: BC Managed Care – PPO | Source: Ambulatory Visit | Attending: Radiation Oncology | Admitting: Radiation Oncology

## 2022-12-18 ENCOUNTER — Inpatient Hospital Stay (HOSPITAL_BASED_OUTPATIENT_CLINIC_OR_DEPARTMENT_OTHER): Payer: BC Managed Care – PPO

## 2022-12-18 ENCOUNTER — Telehealth: Payer: Self-pay

## 2022-12-18 ENCOUNTER — Encounter (HOSPITAL_BASED_OUTPATIENT_CLINIC_OR_DEPARTMENT_OTHER): Payer: Self-pay

## 2022-12-18 ENCOUNTER — Encounter (HOSPITAL_COMMUNITY): Payer: Self-pay | Admitting: Radiology

## 2022-12-18 VITALS — BP 111/75 | HR 135 | Temp 98.0°F | Resp 16 | Ht 63.0 in | Wt 132.5 lb

## 2022-12-18 DIAGNOSIS — Z87891 Personal history of nicotine dependence: Secondary | ICD-10-CM | POA: Diagnosis not present

## 2022-12-18 DIAGNOSIS — K5903 Drug induced constipation: Secondary | ICD-10-CM

## 2022-12-18 DIAGNOSIS — C7802 Secondary malignant neoplasm of left lung: Secondary | ICD-10-CM | POA: Diagnosis not present

## 2022-12-18 DIAGNOSIS — C439 Malignant melanoma of skin, unspecified: Secondary | ICD-10-CM | POA: Diagnosis not present

## 2022-12-18 DIAGNOSIS — Z79620 Long term (current) use of immunosuppressive biologic: Secondary | ICD-10-CM | POA: Diagnosis not present

## 2022-12-18 DIAGNOSIS — Z803 Family history of malignant neoplasm of breast: Secondary | ICD-10-CM | POA: Diagnosis not present

## 2022-12-18 DIAGNOSIS — G893 Neoplasm related pain (acute) (chronic): Secondary | ICD-10-CM | POA: Diagnosis not present

## 2022-12-18 DIAGNOSIS — Z79899 Other long term (current) drug therapy: Secondary | ICD-10-CM | POA: Diagnosis not present

## 2022-12-18 DIAGNOSIS — Z510 Encounter for antineoplastic radiation therapy: Secondary | ICD-10-CM | POA: Diagnosis not present

## 2022-12-18 DIAGNOSIS — K219 Gastro-esophageal reflux disease without esophagitis: Secondary | ICD-10-CM | POA: Diagnosis not present

## 2022-12-18 DIAGNOSIS — Z5112 Encounter for antineoplastic immunotherapy: Secondary | ICD-10-CM | POA: Diagnosis not present

## 2022-12-18 DIAGNOSIS — C7951 Secondary malignant neoplasm of bone: Secondary | ICD-10-CM | POA: Diagnosis not present

## 2022-12-18 DIAGNOSIS — Z801 Family history of malignant neoplasm of trachea, bronchus and lung: Secondary | ICD-10-CM | POA: Diagnosis not present

## 2022-12-18 DIAGNOSIS — C7931 Secondary malignant neoplasm of brain: Secondary | ICD-10-CM | POA: Diagnosis not present

## 2022-12-18 DIAGNOSIS — C4359 Malignant melanoma of other part of trunk: Secondary | ICD-10-CM | POA: Diagnosis not present

## 2022-12-18 DIAGNOSIS — C787 Secondary malignant neoplasm of liver and intrahepatic bile duct: Secondary | ICD-10-CM | POA: Diagnosis not present

## 2022-12-18 DIAGNOSIS — C771 Secondary and unspecified malignant neoplasm of intrathoracic lymph nodes: Secondary | ICD-10-CM | POA: Diagnosis not present

## 2022-12-18 DIAGNOSIS — I10 Essential (primary) hypertension: Secondary | ICD-10-CM | POA: Diagnosis not present

## 2022-12-18 DIAGNOSIS — M797 Fibromyalgia: Secondary | ICD-10-CM | POA: Diagnosis not present

## 2022-12-18 DIAGNOSIS — K5900 Constipation, unspecified: Secondary | ICD-10-CM | POA: Diagnosis not present

## 2022-12-18 DIAGNOSIS — E7800 Pure hypercholesterolemia, unspecified: Secondary | ICD-10-CM | POA: Diagnosis not present

## 2022-12-18 DIAGNOSIS — N289 Disorder of kidney and ureter, unspecified: Secondary | ICD-10-CM | POA: Diagnosis not present

## 2022-12-18 LAB — RAD ONC ARIA SESSION SUMMARY
Course Elapsed Days: 4
Plan Fractions Treated to Date: 5
Plan Prescribed Dose Per Fraction: 3 Gy
Plan Total Fractions Prescribed: 10
Plan Total Prescribed Dose: 30 Gy
Reference Point Dosage Given to Date: 15 Gy
Reference Point Session Dosage Given: 3 Gy
Session Number: 5

## 2022-12-18 MED ORDER — RADIAPLEXRX EX GEL
Freq: Once | CUTANEOUS | Status: AC
  Administered 2022-12-18: 1 via TOPICAL

## 2022-12-18 NOTE — Assessment & Plan Note (Signed)
Add benefiber On Miralax as needed and soft softener.  Increased fluid.

## 2022-12-18 NOTE — Telephone Encounter (Signed)
RN called pt to check in. Pt stating MS Contin without any unwanted side effects per pt. Pt also to try to have a BM tomorrow with magnesium citrate, will call with any questions or concerns.

## 2022-12-18 NOTE — Telephone Encounter (Signed)
TC to [facility] to request an add-on order to pt's pathology accession # [id number] per verbal order of Dr. Cherly Hensen: BRAF test requested. Mindi Junker reports she has placed this order and needs no additional orders entered.

## 2022-12-20 ENCOUNTER — Ambulatory Visit
Admission: RE | Admit: 2022-12-20 | Discharge: 2022-12-20 | Disposition: A | Discharge status: Home / Self Care | Payer: BC Managed Care – PPO | Source: Ambulatory Visit | Attending: Radiation Oncology | Admitting: Radiation Oncology

## 2022-12-20 ENCOUNTER — Other Ambulatory Visit: Payer: Self-pay

## 2022-12-20 DIAGNOSIS — Z79899 Other long term (current) drug therapy: Secondary | ICD-10-CM | POA: Diagnosis not present

## 2022-12-20 DIAGNOSIS — M797 Fibromyalgia: Secondary | ICD-10-CM | POA: Diagnosis not present

## 2022-12-20 DIAGNOSIS — Z79620 Long term (current) use of immunosuppressive biologic: Secondary | ICD-10-CM | POA: Diagnosis not present

## 2022-12-20 DIAGNOSIS — G893 Neoplasm related pain (acute) (chronic): Secondary | ICD-10-CM | POA: Diagnosis not present

## 2022-12-20 DIAGNOSIS — Z510 Encounter for antineoplastic radiation therapy: Secondary | ICD-10-CM | POA: Diagnosis not present

## 2022-12-20 DIAGNOSIS — N289 Disorder of kidney and ureter, unspecified: Secondary | ICD-10-CM | POA: Diagnosis not present

## 2022-12-20 DIAGNOSIS — Z801 Family history of malignant neoplasm of trachea, bronchus and lung: Secondary | ICD-10-CM | POA: Diagnosis not present

## 2022-12-20 DIAGNOSIS — K219 Gastro-esophageal reflux disease without esophagitis: Secondary | ICD-10-CM | POA: Diagnosis not present

## 2022-12-20 DIAGNOSIS — C439 Malignant melanoma of skin, unspecified: Secondary | ICD-10-CM | POA: Diagnosis not present

## 2022-12-20 DIAGNOSIS — Z5112 Encounter for antineoplastic immunotherapy: Secondary | ICD-10-CM | POA: Diagnosis not present

## 2022-12-20 DIAGNOSIS — C7802 Secondary malignant neoplasm of left lung: Secondary | ICD-10-CM | POA: Diagnosis not present

## 2022-12-20 DIAGNOSIS — C771 Secondary and unspecified malignant neoplasm of intrathoracic lymph nodes: Secondary | ICD-10-CM | POA: Diagnosis not present

## 2022-12-20 DIAGNOSIS — C7951 Secondary malignant neoplasm of bone: Secondary | ICD-10-CM | POA: Diagnosis not present

## 2022-12-20 DIAGNOSIS — E7800 Pure hypercholesterolemia, unspecified: Secondary | ICD-10-CM | POA: Diagnosis not present

## 2022-12-20 DIAGNOSIS — C7931 Secondary malignant neoplasm of brain: Secondary | ICD-10-CM | POA: Diagnosis not present

## 2022-12-20 DIAGNOSIS — C787 Secondary malignant neoplasm of liver and intrahepatic bile duct: Secondary | ICD-10-CM | POA: Diagnosis not present

## 2022-12-20 DIAGNOSIS — I10 Essential (primary) hypertension: Secondary | ICD-10-CM | POA: Diagnosis not present

## 2022-12-20 DIAGNOSIS — C4359 Malignant melanoma of other part of trunk: Secondary | ICD-10-CM | POA: Diagnosis not present

## 2022-12-20 DIAGNOSIS — K5900 Constipation, unspecified: Secondary | ICD-10-CM | POA: Diagnosis not present

## 2022-12-20 DIAGNOSIS — Z803 Family history of malignant neoplasm of breast: Secondary | ICD-10-CM | POA: Diagnosis not present

## 2022-12-20 DIAGNOSIS — Z87891 Personal history of nicotine dependence: Secondary | ICD-10-CM | POA: Diagnosis not present

## 2022-12-20 LAB — RAD ONC ARIA SESSION SUMMARY
Course Elapsed Days: 6
Plan Fractions Treated to Date: 6
Plan Prescribed Dose Per Fraction: 3 Gy
Plan Total Fractions Prescribed: 10
Plan Total Prescribed Dose: 30 Gy
Reference Point Dosage Given to Date: 18 Gy
Reference Point Session Dosage Given: 3 Gy
Session Number: 6

## 2022-12-21 ENCOUNTER — Other Ambulatory Visit: Payer: Self-pay

## 2022-12-21 ENCOUNTER — Ambulatory Visit
Admission: RE | Admit: 2022-12-21 | Discharge: 2022-12-21 | Disposition: A | Discharge status: Home / Self Care | Payer: BC Managed Care – PPO | Source: Ambulatory Visit | Attending: Radiation Oncology

## 2022-12-21 DIAGNOSIS — C439 Malignant melanoma of skin, unspecified: Secondary | ICD-10-CM | POA: Diagnosis not present

## 2022-12-21 DIAGNOSIS — Z510 Encounter for antineoplastic radiation therapy: Secondary | ICD-10-CM | POA: Diagnosis not present

## 2022-12-21 DIAGNOSIS — Z79899 Other long term (current) drug therapy: Secondary | ICD-10-CM | POA: Diagnosis not present

## 2022-12-21 DIAGNOSIS — C7951 Secondary malignant neoplasm of bone: Secondary | ICD-10-CM | POA: Diagnosis not present

## 2022-12-21 DIAGNOSIS — C771 Secondary and unspecified malignant neoplasm of intrathoracic lymph nodes: Secondary | ICD-10-CM | POA: Diagnosis not present

## 2022-12-21 DIAGNOSIS — G893 Neoplasm related pain (acute) (chronic): Secondary | ICD-10-CM | POA: Diagnosis not present

## 2022-12-21 DIAGNOSIS — K219 Gastro-esophageal reflux disease without esophagitis: Secondary | ICD-10-CM | POA: Diagnosis not present

## 2022-12-21 DIAGNOSIS — I10 Essential (primary) hypertension: Secondary | ICD-10-CM | POA: Diagnosis not present

## 2022-12-21 DIAGNOSIS — C4359 Malignant melanoma of other part of trunk: Secondary | ICD-10-CM | POA: Diagnosis not present

## 2022-12-21 DIAGNOSIS — Z79620 Long term (current) use of immunosuppressive biologic: Secondary | ICD-10-CM | POA: Diagnosis not present

## 2022-12-21 DIAGNOSIS — K5900 Constipation, unspecified: Secondary | ICD-10-CM | POA: Diagnosis not present

## 2022-12-21 DIAGNOSIS — N289 Disorder of kidney and ureter, unspecified: Secondary | ICD-10-CM | POA: Diagnosis not present

## 2022-12-21 DIAGNOSIS — C7802 Secondary malignant neoplasm of left lung: Secondary | ICD-10-CM | POA: Diagnosis not present

## 2022-12-21 DIAGNOSIS — C7931 Secondary malignant neoplasm of brain: Secondary | ICD-10-CM | POA: Diagnosis not present

## 2022-12-21 DIAGNOSIS — Z87891 Personal history of nicotine dependence: Secondary | ICD-10-CM | POA: Diagnosis not present

## 2022-12-21 DIAGNOSIS — E7800 Pure hypercholesterolemia, unspecified: Secondary | ICD-10-CM | POA: Diagnosis not present

## 2022-12-21 DIAGNOSIS — Z803 Family history of malignant neoplasm of breast: Secondary | ICD-10-CM | POA: Diagnosis not present

## 2022-12-21 DIAGNOSIS — C787 Secondary malignant neoplasm of liver and intrahepatic bile duct: Secondary | ICD-10-CM | POA: Diagnosis not present

## 2022-12-21 DIAGNOSIS — Z801 Family history of malignant neoplasm of trachea, bronchus and lung: Secondary | ICD-10-CM | POA: Diagnosis not present

## 2022-12-21 DIAGNOSIS — M797 Fibromyalgia: Secondary | ICD-10-CM | POA: Diagnosis not present

## 2022-12-21 DIAGNOSIS — Z5112 Encounter for antineoplastic immunotherapy: Secondary | ICD-10-CM | POA: Diagnosis not present

## 2022-12-21 LAB — RAD ONC ARIA SESSION SUMMARY
Course Elapsed Days: 7
Plan Fractions Treated to Date: 7
Plan Prescribed Dose Per Fraction: 3 Gy
Plan Total Fractions Prescribed: 10
Plan Total Prescribed Dose: 30 Gy
Reference Point Dosage Given to Date: 21 Gy
Reference Point Session Dosage Given: 3 Gy
Session Number: 7

## 2022-12-22 ENCOUNTER — Telehealth: Payer: Self-pay

## 2022-12-22 ENCOUNTER — Ambulatory Visit
Admission: RE | Admit: 2022-12-22 | Discharge: 2022-12-22 | Disposition: A | Discharge status: Home / Self Care | Payer: BC Managed Care – PPO | Source: Ambulatory Visit | Attending: Radiation Oncology

## 2022-12-22 ENCOUNTER — Other Ambulatory Visit: Payer: Self-pay

## 2022-12-22 DIAGNOSIS — Z801 Family history of malignant neoplasm of trachea, bronchus and lung: Secondary | ICD-10-CM | POA: Diagnosis not present

## 2022-12-22 DIAGNOSIS — K219 Gastro-esophageal reflux disease without esophagitis: Secondary | ICD-10-CM | POA: Diagnosis not present

## 2022-12-22 DIAGNOSIS — Z79620 Long term (current) use of immunosuppressive biologic: Secondary | ICD-10-CM | POA: Diagnosis not present

## 2022-12-22 DIAGNOSIS — N289 Disorder of kidney and ureter, unspecified: Secondary | ICD-10-CM | POA: Diagnosis not present

## 2022-12-22 DIAGNOSIS — Z79899 Other long term (current) drug therapy: Secondary | ICD-10-CM | POA: Diagnosis not present

## 2022-12-22 DIAGNOSIS — K5900 Constipation, unspecified: Secondary | ICD-10-CM | POA: Diagnosis not present

## 2022-12-22 DIAGNOSIS — C4359 Malignant melanoma of other part of trunk: Secondary | ICD-10-CM | POA: Diagnosis not present

## 2022-12-22 DIAGNOSIS — C439 Malignant melanoma of skin, unspecified: Secondary | ICD-10-CM | POA: Diagnosis not present

## 2022-12-22 DIAGNOSIS — E7800 Pure hypercholesterolemia, unspecified: Secondary | ICD-10-CM | POA: Diagnosis not present

## 2022-12-22 DIAGNOSIS — I10 Essential (primary) hypertension: Secondary | ICD-10-CM | POA: Diagnosis not present

## 2022-12-22 DIAGNOSIS — C7931 Secondary malignant neoplasm of brain: Secondary | ICD-10-CM | POA: Diagnosis not present

## 2022-12-22 DIAGNOSIS — G893 Neoplasm related pain (acute) (chronic): Secondary | ICD-10-CM | POA: Diagnosis not present

## 2022-12-22 DIAGNOSIS — M797 Fibromyalgia: Secondary | ICD-10-CM | POA: Diagnosis not present

## 2022-12-22 DIAGNOSIS — C787 Secondary malignant neoplasm of liver and intrahepatic bile duct: Secondary | ICD-10-CM | POA: Diagnosis not present

## 2022-12-22 DIAGNOSIS — C771 Secondary and unspecified malignant neoplasm of intrathoracic lymph nodes: Secondary | ICD-10-CM | POA: Diagnosis not present

## 2022-12-22 DIAGNOSIS — Z803 Family history of malignant neoplasm of breast: Secondary | ICD-10-CM | POA: Diagnosis not present

## 2022-12-22 DIAGNOSIS — Z87891 Personal history of nicotine dependence: Secondary | ICD-10-CM | POA: Diagnosis not present

## 2022-12-22 DIAGNOSIS — C7951 Secondary malignant neoplasm of bone: Secondary | ICD-10-CM | POA: Diagnosis not present

## 2022-12-22 DIAGNOSIS — C7802 Secondary malignant neoplasm of left lung: Secondary | ICD-10-CM | POA: Diagnosis not present

## 2022-12-22 DIAGNOSIS — Z5112 Encounter for antineoplastic immunotherapy: Secondary | ICD-10-CM | POA: Diagnosis not present

## 2022-12-22 DIAGNOSIS — Z510 Encounter for antineoplastic radiation therapy: Secondary | ICD-10-CM | POA: Diagnosis not present

## 2022-12-22 LAB — RAD ONC ARIA SESSION SUMMARY
Course Elapsed Days: 8
Plan Fractions Treated to Date: 8
Plan Prescribed Dose Per Fraction: 3 Gy
Plan Total Fractions Prescribed: 10
Plan Total Prescribed Dose: 30 Gy
Reference Point Dosage Given to Date: 24 Gy
Reference Point Session Dosage Given: 3 Gy
Session Number: 8

## 2022-12-22 NOTE — Telephone Encounter (Signed)
Patient notified of completion of FMLA Forms and Critical Illness Form. Copy of forms placed for pick-up as requested. No other needs or concerns voiced at this time.

## 2022-12-23 ENCOUNTER — Ambulatory Visit
Admission: RE | Admit: 2022-12-23 | Discharge: 2022-12-23 | Disposition: A | Discharge status: Home / Self Care | Payer: BC Managed Care – PPO | Source: Ambulatory Visit | Attending: Radiation Oncology | Admitting: Radiation Oncology

## 2022-12-23 ENCOUNTER — Other Ambulatory Visit: Payer: Self-pay

## 2022-12-23 DIAGNOSIS — Z79899 Other long term (current) drug therapy: Secondary | ICD-10-CM | POA: Diagnosis not present

## 2022-12-23 DIAGNOSIS — M797 Fibromyalgia: Secondary | ICD-10-CM | POA: Diagnosis not present

## 2022-12-23 DIAGNOSIS — K219 Gastro-esophageal reflux disease without esophagitis: Secondary | ICD-10-CM | POA: Diagnosis not present

## 2022-12-23 DIAGNOSIS — Z801 Family history of malignant neoplasm of trachea, bronchus and lung: Secondary | ICD-10-CM | POA: Diagnosis not present

## 2022-12-23 DIAGNOSIS — I10 Essential (primary) hypertension: Secondary | ICD-10-CM | POA: Diagnosis not present

## 2022-12-23 DIAGNOSIS — Z803 Family history of malignant neoplasm of breast: Secondary | ICD-10-CM | POA: Diagnosis not present

## 2022-12-23 DIAGNOSIS — Z5112 Encounter for antineoplastic immunotherapy: Secondary | ICD-10-CM | POA: Diagnosis not present

## 2022-12-23 DIAGNOSIS — C787 Secondary malignant neoplasm of liver and intrahepatic bile duct: Secondary | ICD-10-CM | POA: Diagnosis not present

## 2022-12-23 DIAGNOSIS — Z510 Encounter for antineoplastic radiation therapy: Secondary | ICD-10-CM | POA: Diagnosis not present

## 2022-12-23 DIAGNOSIS — C7802 Secondary malignant neoplasm of left lung: Secondary | ICD-10-CM | POA: Diagnosis not present

## 2022-12-23 DIAGNOSIS — G893 Neoplasm related pain (acute) (chronic): Secondary | ICD-10-CM | POA: Diagnosis not present

## 2022-12-23 DIAGNOSIS — C7931 Secondary malignant neoplasm of brain: Secondary | ICD-10-CM | POA: Diagnosis not present

## 2022-12-23 DIAGNOSIS — K5900 Constipation, unspecified: Secondary | ICD-10-CM | POA: Diagnosis not present

## 2022-12-23 DIAGNOSIS — C439 Malignant melanoma of skin, unspecified: Secondary | ICD-10-CM | POA: Diagnosis not present

## 2022-12-23 DIAGNOSIS — E7800 Pure hypercholesterolemia, unspecified: Secondary | ICD-10-CM | POA: Diagnosis not present

## 2022-12-23 DIAGNOSIS — N289 Disorder of kidney and ureter, unspecified: Secondary | ICD-10-CM | POA: Diagnosis not present

## 2022-12-23 DIAGNOSIS — Z87891 Personal history of nicotine dependence: Secondary | ICD-10-CM | POA: Diagnosis not present

## 2022-12-23 DIAGNOSIS — C4359 Malignant melanoma of other part of trunk: Secondary | ICD-10-CM | POA: Diagnosis not present

## 2022-12-23 DIAGNOSIS — C771 Secondary and unspecified malignant neoplasm of intrathoracic lymph nodes: Secondary | ICD-10-CM | POA: Diagnosis not present

## 2022-12-23 DIAGNOSIS — Z79620 Long term (current) use of immunosuppressive biologic: Secondary | ICD-10-CM | POA: Diagnosis not present

## 2022-12-23 DIAGNOSIS — C7951 Secondary malignant neoplasm of bone: Secondary | ICD-10-CM | POA: Diagnosis not present

## 2022-12-23 LAB — RAD ONC ARIA SESSION SUMMARY
Course Elapsed Days: 9
Plan Fractions Treated to Date: 9
Plan Prescribed Dose Per Fraction: 3 Gy
Plan Total Fractions Prescribed: 10
Plan Total Prescribed Dose: 30 Gy
Reference Point Dosage Given to Date: 27 Gy
Reference Point Session Dosage Given: 3 Gy
Session Number: 9

## 2022-12-25 NOTE — Progress Notes (Signed)
Palliative Medicine [facility]  [phone] [phone]) [phone]   Name: Laura [name] Date: 12/25/2022 MRN: [id number]  DOB: 1965-03-24  Patient Care Team: [name], [name] as PCP - General (Family Medicine) Pickenpack-Cousar, Arty Baumgartner, NP as Nurse Practitioner [facility] and Palliative Medicine)    INTERVAL HISTORY: Laura [name] is a 57 y.o. female with oncologic medical history including malignant melanoma (10/2022) with metastatic disease to the bone. As well as a history of hypertension, hyperlipidemia, GERD, and arthritis. Palliative ask to see for symptom management and goals of care.   SOCIAL HISTORY:     reports that she quit smoking about 6 years ago. Her smoking use included cigarettes. She started smoking about 26 years ago. She has a 20 pack-year smoking history. She has never used smokeless tobacco. She reports that she does not drink alcohol and does not use drugs.  ADVANCE DIRECTIVES:  None on file  CODE STATUS: Full code  PAST MEDICAL HISTORY: Past Medical History:  Diagnosis Date   Anxiety    Arthritis    Complication of anesthesia    Fibromyalgia    Gallstones 04/20/2018   GERD (gastroesophageal reflux disease)    History of blood transfusion    post Hysterectomy   Hypercholesteremia    Melanoma (HCC)    on back and excised   Migraine    PONV (postoperative nausea and vomiting)    Tobacco abuse    Vertebral artery disease (HCC)    history of left vertebral artery dissection 2011    ALLERGIES:  is allergic to aspirin.  MEDICATIONS:  Current Outpatient Medications  Medication Sig Dispense Refill   AMBIEN CR 6.25 MG CR tablet Take 6.25 mg by mouth at bedtime.     CRESTOR 10 MG tablet Take 10 mg by mouth daily.     cyclobenzaprine (FLEXERIL) 5 MG tablet Take 1 tablet (5 mg total) by mouth 3 (three) times daily as needed for muscle spasms. 30 tablet 0   dronabinol (MARINOL) 2.5 MG capsule Take 1 capsule  (2.5 mg total) by mouth 2 (two) times daily before a meal. 30 capsule 0   famotidine-calcium carbonate-magnesium hydroxide (PEPCID COMPLETE) 10-800-165 MG chewable tablet Chew 1 tablet by mouth daily as needed (acid reflux).     HYDROmorphone (DILAUDID) 2 MG tablet Take 1 tablet (2 mg total) by mouth every 6 (six) hours as needed for severe pain (pain score 7-10). 45 tablet 0   ibuprofen (ADVIL) 200 MG tablet Take 400 mg by mouth every 6 (six) hours as needed for moderate pain (pain score 4-6).     lidocaine (LIDODERM) 5 % Place 1 patch onto the skin daily.     LORazepam (ATIVAN) 0.5 MG tablet Take 1 tab 30 minutes before PET and 2 tabs before MRI 30 tablet 0   magnesium citrate SOLN Take 4 oz if no bowel movement in 4 hours take additional 4 oz. 195 mL 0   morphine (MS CONTIN) 30 MG 12 hr tablet Take 1 tablet (30 mg total) by mouth every 12 (twelve) hours. 60 tablet 0   ondansetron (ZOFRAN) 8 MG tablet Take 1 tablet (8 mg total) by mouth every 8 (eight) hours as needed. 60 tablet 3   pantoprazole (PROTONIX) 40 MG tablet Take 1 tablet (40 mg total) by mouth daily. 30 tablet 2   polyethylene glycol (MIRALAX / GLYCOLAX) 17 g packet Take 17 g by mouth 2 (two) times daily.     pregabalin (LYRICA)  75 MG capsule Take 75 mg by mouth 3 (three) times daily.     promethazine (PHENERGAN) 25 MG tablet Take 25 mg by mouth every 12 (twelve) hours as needed for nausea or vomiting.     senna-docusate (SENOKOT-S) 8.6-50 MG tablet Take 1 tablet by mouth 2 (two) times daily.     sucralfate (CARAFATE) 1 g tablet Take 1 tablet (1 g total) by mouth 4 (four) times daily -  with meals and at bedtime. 90 tablet 1   No current facility-administered medications for this visit.    VITAL SIGNS: There were no vitals taken for this visit. There were no vitals filed for this visit.  Estimated body mass index is 23.47 kg/m as calculated from the following:   Height as of 12/18/22: 5\' 3"  (1.6 m).   Weight as of 12/18/22: 132  lb 8 oz (60.1 kg).   PERFORMANCE STATUS (ECOG) : 1 - Symptomatic but completely ambulatory   Physical Exam General: NAD Cardiovascular: regular rate and rhythm Pulmonary: clear ant fields Abdomen: soft, nontender, + bowel sounds Extremities: no edema, no joint deformities Skin: no rashes Neurological: AAO x3  IMPRESSION: History of Present Illness Laura [name] presents to clinic today for symptom management follow-up. No acute distress noted. Her husband is present. Taking things one day at a time. She endorses ongoing fatigue. Recent hospitalization due to encephalopathy and dehydration. Denies nausea, vomiting, constipation, or diarrhea. The patient also reports issues with her prescribed oxygen therapy, stating she has not received any communication from the home health care provider regarding delivery. She expresses concern about the cost of the oxygen therapy and whether it is covered by her insurance. Advised we will follow-up and provide updates.   In addition, the patient reports a lack of appetite, stating she has not been eating much. She has been taking dronabinol twice daily to stimulate appetite but reports it has not been effective. She also reports feeling tired, but it is unclear whether this is related to her overall health status and recent hospitalization. Patient recommended to increase marinol to 5mg  twice daily.  Instructed patient and husband if noticeable changes in mentation after increasing medication to decrease back down to 2.5mg .   Laura [name] complains of increased lower back pain described as "soreness" and constant "throb". She reports not taking her breakthrough pain medication as previously instructed, which may have contributed to the exacerbation of her pain. The patient rates her current pain level as an 8 out of 10. Unfortunately, there was some confusion at discharge as to how to take medication and she was not aware that she could continue with breakthrough  medication.  Education provided on use. Given level of significant discomfort patient to receive dose of medication during her infusion today for comfort. She is tolerating MS Contin 15mg  every 12 hours. Denies drowsiness or mentation changes. We will continue to closely monitor and assess renal function. Pregabalin 75mg  three times daily.   Lastly, the patient reports no issues with constipation or diarrhea in the past couple of days.  I discussed the importance of continued conversation with family and their medical providers regarding overall plan of care and treatment options, ensuring decisions are within the context of the patients values and GOCs.  PLAN:  Cancer Related Pain Severe lower back pain rated as 8/10. Patient has been taking extended-release morphine 15mg  but not utilizing breakthrough medication as needed. -Encouraged to use breakthrough medication every 6 hours as needed. -Administer 0.5mg  IV Dilaudid every 1  hour for 2 doses during infusion today. -Send refill prescription for breakthrough medication to [facility].  Decreased Appetite Patient reports no appetite despite taking dronabinol twice daily. -Increase dronabinol to two tablets twice daily starting tomorrow. Monitor for any changes in drowsiness or confusion.  Oxygen Therapy Patient has not received oxygen delivery as ordered. Insurance coverage and payment issues noted. -Staff to follow up with [employer] regarding delivery and insurance coverage.  Follow-up in 3 weeks. Call if any issues arise before then.  Patient expressed understanding and was in agreement with this plan. She also understands that She can call the clinic at any time with any questions, concerns, or complaints.   Any controlled substances utilized were prescribed in the context of palliative care. PDMP has been reviewed.    Visit consisted of counseling and education dealing with the complex and emotionally intense issues of  symptom management and palliative care in the setting of serious and potentially life-threatening illness.  Willette Alma, AGPCNP-BC  Palliative Medicine Team/[facility]

## 2022-12-27 ENCOUNTER — Inpatient Hospital Stay (HOSPITAL_COMMUNITY)
Admission: EM | Admit: 2022-12-27 | Discharge: 2022-12-29 | DRG: 091 | Disposition: A | Discharge status: Home / Self Care | Payer: BC Managed Care – PPO | Attending: Internal Medicine | Admitting: Internal Medicine

## 2022-12-27 ENCOUNTER — Emergency Department (HOSPITAL_COMMUNITY): Payer: BC Managed Care – PPO

## 2022-12-27 ENCOUNTER — Other Ambulatory Visit: Payer: Self-pay

## 2022-12-27 DIAGNOSIS — M797 Fibromyalgia: Secondary | ICD-10-CM | POA: Diagnosis present

## 2022-12-27 DIAGNOSIS — E871 Hypo-osmolality and hyponatremia: Secondary | ICD-10-CM | POA: Diagnosis present

## 2022-12-27 DIAGNOSIS — Z6824 Body mass index (BMI) 24.0-24.9, adult: Secondary | ICD-10-CM

## 2022-12-27 DIAGNOSIS — G928 Other toxic encephalopathy: Secondary | ICD-10-CM | POA: Diagnosis not present

## 2022-12-27 DIAGNOSIS — I10 Essential (primary) hypertension: Secondary | ICD-10-CM | POA: Diagnosis not present

## 2022-12-27 DIAGNOSIS — C7801 Secondary malignant neoplasm of right lung: Secondary | ICD-10-CM | POA: Diagnosis not present

## 2022-12-27 DIAGNOSIS — J439 Emphysema, unspecified: Secondary | ICD-10-CM | POA: Diagnosis not present

## 2022-12-27 DIAGNOSIS — Z803 Family history of malignant neoplasm of breast: Secondary | ICD-10-CM

## 2022-12-27 DIAGNOSIS — C7951 Secondary malignant neoplasm of bone: Secondary | ICD-10-CM | POA: Diagnosis present

## 2022-12-27 DIAGNOSIS — K219 Gastro-esophageal reflux disease without esophagitis: Secondary | ICD-10-CM | POA: Diagnosis present

## 2022-12-27 DIAGNOSIS — A419 Sepsis, unspecified organism: Principal | ICD-10-CM

## 2022-12-27 DIAGNOSIS — C439 Malignant melanoma of skin, unspecified: Secondary | ICD-10-CM | POA: Diagnosis not present

## 2022-12-27 DIAGNOSIS — R6520 Severe sepsis without septic shock: Secondary | ICD-10-CM | POA: Diagnosis not present

## 2022-12-27 DIAGNOSIS — C787 Secondary malignant neoplasm of liver and intrahepatic bile duct: Secondary | ICD-10-CM | POA: Diagnosis not present

## 2022-12-27 DIAGNOSIS — K5903 Drug induced constipation: Secondary | ICD-10-CM | POA: Diagnosis not present

## 2022-12-27 DIAGNOSIS — E876 Hypokalemia: Secondary | ICD-10-CM | POA: Diagnosis present

## 2022-12-27 DIAGNOSIS — F419 Anxiety disorder, unspecified: Secondary | ICD-10-CM | POA: Diagnosis present

## 2022-12-27 DIAGNOSIS — G893 Neoplasm related pain (acute) (chronic): Secondary | ICD-10-CM | POA: Diagnosis not present

## 2022-12-27 DIAGNOSIS — Z79899 Other long term (current) drug therapy: Secondary | ICD-10-CM

## 2022-12-27 DIAGNOSIS — R410 Disorientation, unspecified: Secondary | ICD-10-CM | POA: Diagnosis not present

## 2022-12-27 DIAGNOSIS — F17290 Nicotine dependence, other tobacco product, uncomplicated: Secondary | ICD-10-CM | POA: Diagnosis present

## 2022-12-27 DIAGNOSIS — N179 Acute kidney failure, unspecified: Secondary | ICD-10-CM | POA: Diagnosis not present

## 2022-12-27 DIAGNOSIS — G9340 Encephalopathy, unspecified: Secondary | ICD-10-CM | POA: Diagnosis not present

## 2022-12-27 DIAGNOSIS — G9341 Metabolic encephalopathy: Secondary | ICD-10-CM | POA: Diagnosis not present

## 2022-12-27 DIAGNOSIS — J9601 Acute respiratory failure with hypoxia: Secondary | ICD-10-CM | POA: Diagnosis not present

## 2022-12-27 DIAGNOSIS — Z923 Personal history of irradiation: Secondary | ICD-10-CM

## 2022-12-27 DIAGNOSIS — E860 Dehydration: Secondary | ICD-10-CM | POA: Diagnosis present

## 2022-12-27 DIAGNOSIS — Z510 Encounter for antineoplastic radiation therapy: Secondary | ICD-10-CM | POA: Diagnosis not present

## 2022-12-27 DIAGNOSIS — Z85820 Personal history of malignant melanoma of skin: Secondary | ICD-10-CM

## 2022-12-27 DIAGNOSIS — Z801 Family history of malignant neoplasm of trachea, bronchus and lung: Secondary | ICD-10-CM | POA: Diagnosis not present

## 2022-12-27 DIAGNOSIS — Z1152 Encounter for screening for COVID-19: Secondary | ICD-10-CM | POA: Diagnosis not present

## 2022-12-27 DIAGNOSIS — Z85841 Personal history of malignant neoplasm of brain: Secondary | ICD-10-CM | POA: Diagnosis not present

## 2022-12-27 DIAGNOSIS — R630 Anorexia: Secondary | ICD-10-CM | POA: Diagnosis present

## 2022-12-27 DIAGNOSIS — Z886 Allergy status to analgesic agent status: Secondary | ICD-10-CM

## 2022-12-27 DIAGNOSIS — C7931 Secondary malignant neoplasm of brain: Secondary | ICD-10-CM | POA: Diagnosis not present

## 2022-12-27 DIAGNOSIS — Z981 Arthrodesis status: Secondary | ICD-10-CM

## 2022-12-27 DIAGNOSIS — R000 Tachycardia, unspecified: Secondary | ICD-10-CM | POA: Diagnosis not present

## 2022-12-27 DIAGNOSIS — C7800 Secondary malignant neoplasm of unspecified lung: Secondary | ICD-10-CM | POA: Diagnosis present

## 2022-12-27 DIAGNOSIS — K5900 Constipation, unspecified: Secondary | ICD-10-CM | POA: Diagnosis present

## 2022-12-27 DIAGNOSIS — T402X5A Adverse effect of other opioids, initial encounter: Secondary | ICD-10-CM | POA: Diagnosis present

## 2022-12-27 DIAGNOSIS — J9811 Atelectasis: Secondary | ICD-10-CM | POA: Diagnosis not present

## 2022-12-27 DIAGNOSIS — I700 Atherosclerosis of aorta: Secondary | ICD-10-CM | POA: Diagnosis not present

## 2022-12-27 DIAGNOSIS — E7800 Pure hypercholesterolemia, unspecified: Secondary | ICD-10-CM | POA: Diagnosis present

## 2022-12-27 DIAGNOSIS — C7802 Secondary malignant neoplasm of left lung: Secondary | ICD-10-CM | POA: Diagnosis not present

## 2022-12-27 DIAGNOSIS — E869 Volume depletion, unspecified: Secondary | ICD-10-CM | POA: Diagnosis present

## 2022-12-27 LAB — CBC WITH DIFFERENTIAL/PLATELET
Abs Immature Granulocytes: 0.42 10*3/uL — ABNORMAL HIGH (ref 0.00–0.07)
Basophils Absolute: 0.1 10*3/uL (ref 0.0–0.1)
Basophils Relative: 1 %
Eosinophils Absolute: 0.2 10*3/uL (ref 0.0–0.5)
Eosinophils Relative: 2 %
HCT: 35.1 % — ABNORMAL LOW (ref 36.0–46.0)
Hemoglobin: 11.8 g/dL — ABNORMAL LOW (ref 12.0–15.0)
Immature Granulocytes: 7 %
Lymphocytes Relative: 9 %
Lymphs Abs: 0.6 10*3/uL — ABNORMAL LOW (ref 0.7–4.0)
MCH: 31.2 pg (ref 26.0–34.0)
MCHC: 33.6 g/dL (ref 30.0–36.0)
MCV: 92.9 fL (ref 80.0–100.0)
Monocytes Absolute: 0.4 10*3/uL (ref 0.1–1.0)
Monocytes Relative: 6 %
Neutro Abs: 4.7 10*3/uL (ref 1.7–7.7)
Neutrophils Relative %: 75 %
Platelets: 305 10*3/uL (ref 150–400)
RBC: 3.78 MIL/uL — ABNORMAL LOW (ref 3.87–5.11)
RDW: 13.2 % (ref 11.5–15.5)
WBC: 6.3 10*3/uL (ref 4.0–10.5)
nRBC: 0.9 % — ABNORMAL HIGH (ref 0.0–0.2)

## 2022-12-27 LAB — COMPREHENSIVE METABOLIC PANEL
ALT: 54 U/L — ABNORMAL HIGH (ref 0–44)
AST: 128 U/L — ABNORMAL HIGH (ref 15–41)
Albumin: 3 g/dL — ABNORMAL LOW (ref 3.5–5.0)
Alkaline Phosphatase: 334 U/L — ABNORMAL HIGH (ref 38–126)
Anion gap: 13 (ref 5–15)
BUN: 18 mg/dL (ref 6–20)
CO2: 30 mmol/L (ref 22–32)
Calcium: 10.6 mg/dL — ABNORMAL HIGH (ref 8.9–10.3)
Chloride: 89 mmol/L — ABNORMAL LOW (ref 98–111)
Creatinine, Ser: 1.37 mg/dL — ABNORMAL HIGH (ref 0.44–1.00)
GFR, Estimated: 46 mL/min — ABNORMAL LOW (ref >60)
Glucose, Bld: 135 mg/dL — ABNORMAL HIGH (ref 70–99)
Potassium: 2.7 mmol/L — CL (ref 3.5–5.1)
Sodium: 132 mmol/L — ABNORMAL LOW (ref 135–145)
Total Bilirubin: 1.1 mg/dL (ref <1.2)
Total Protein: 7.2 g/dL (ref 6.5–8.1)

## 2022-12-27 LAB — APTT: aPTT: 30 s (ref 24–36)

## 2022-12-27 LAB — PROTIME-INR
INR: 1.3 — ABNORMAL HIGH (ref 0.8–1.2)
Prothrombin Time: 16.5 s — ABNORMAL HIGH (ref 11.4–15.2)

## 2022-12-27 LAB — CBG MONITORING, ED: Glucose-Capillary: 118 mg/dL — ABNORMAL HIGH (ref 70–99)

## 2022-12-27 MED ORDER — LACTATED RINGERS IV BOLUS (SEPSIS)
1000.0000 mL | Freq: Once | INTRAVENOUS | Status: AC
Start: 2022-12-27 — End: 2022-12-28
  Administered 2022-12-27: 1000 mL via INTRAVENOUS

## 2022-12-27 MED ORDER — LACTATED RINGERS IV SOLN: INTRAVENOUS | Status: DC

## 2022-12-27 MED ORDER — CEFEPIME HCL 2 G IV SOLR
2.0000 g | Freq: Once | INTRAVENOUS | Status: AC
  Administered 2022-12-28: 2 g via INTRAVENOUS
  Filled 2022-12-27: qty 12.5

## 2022-12-27 MED ORDER — VANCOMYCIN HCL IN DEXTROSE 1-5 GM/200ML-% IV SOLN
1000.0000 mg | Freq: Once | INTRAVENOUS | Status: AC
Start: 2022-12-27 — End: 2022-12-28
  Administered 2022-12-27: 1000 mg via INTRAVENOUS
  Filled 2022-12-27: qty 200

## 2022-12-27 MED ORDER — METRONIDAZOLE 500 MG/100ML IV SOLN
500.0000 mg | Freq: Once | INTRAVENOUS | Status: AC
  Administered 2022-12-28: 500 mg via INTRAVENOUS
  Filled 2022-12-27: qty 100

## 2022-12-27 MED ORDER — LACTATED RINGERS IV BOLUS (SEPSIS)
1000.0000 mL | Freq: Once | INTRAVENOUS | Status: AC
  Administered 2022-12-27: 1000 mL via INTRAVENOUS

## 2022-12-27 NOTE — Progress Notes (Signed)
ED Pharmacy Antibiotic Sign Off An antibiotic consult was received from an ED provider for vanc and cefepime per pharmacy dosing for sepsis. A chart review was completed to assess appropriateness.   The following one time order(s) were placed:  Vanc 1gm Cefepime 2gm  Further antibiotic and/or antibiotic pharmacy consults should be ordered by the admitting provider if indicated.   Thank you for allowing pharmacy to be a part of this patient's care.   Arley Phenix RPh 12/27/2022, 10:48 PM

## 2022-12-27 NOTE — ED Provider Notes (Signed)
[place] EMERGENCY DEPARTMENT AT [facility] Provider Note   CSN: [id number] Arrival date & time: 12/27/22  2228     History {Add pertinent medical, surgical, social history, OB history to HPI:1} Chief Complaint  Patient presents with   Altered Mental Status    Laura [name] is a 57 y.o. female with history of metastatic melanoma with brain and liver and lung mets who presents with concern for altered mental status.  Her husband is at the bedside and states that he last saw her behaving normally for herself this morning, states that she spent more time in bed today than typical and this evening was "talking out of her head".  Patient's coloring is concerning at time of arrival to the bedside, hypoxic on arrival, improving at this time 2 L nasal cannula. Level 5 caveat due to acuity presentation upon arrival.  She is not anticoagulated.  HPI     Home Medications Prior to Admission medications   Medication Sig Start Date End Date Taking? Authorizing Provider  AMBIEN CR 6.25 MG CR tablet Take 6.25 mg by mouth at bedtime. 05/22/19   [provider]  CRESTOR 10 MG tablet Take 10 mg by mouth daily.    [provider]  cyclobenzaprine (FLEXERIL) 5 MG tablet Take 1 tablet (5 mg total) by mouth 3 (three) times daily as needed for muscle spasms. 12/14/22   Melven Sartorius, MD  dronabinol (MARINOL) 2.5 MG capsule Take 1 capsule (2.5 mg total) by mouth 2 (two) times daily before a meal. 12/16/22   Pickenpack-Cousar, Arty Baumgartner, NP  famotidine-calcium carbonate-magnesium hydroxide (PEPCID COMPLETE) 10-800-165 MG chewable tablet Chew 1 tablet by mouth daily as needed (acid reflux).    [provider]  HYDROmorphone (DILAUDID) 2 MG tablet Take 1 tablet (2 mg total) by mouth every 6 (six) hours as needed for severe pain (pain score 7-10). 12/16/22   Pickenpack-Cousar, Arty Baumgartner, NP  ibuprofen (ADVIL) 200 MG tablet Take 400 mg by mouth every 6 (six) hours as  needed for moderate pain (pain score 4-6).    [provider]  lidocaine (LIDODERM) 5 % Place 1 patch onto the skin daily. 12/10/22   [provider]  LORazepam (ATIVAN) 0.5 MG tablet Take 1 tab 30 minutes before PET and 2 tabs before MRI 12/08/22   Melven Sartorius, MD  magnesium citrate SOLN Take 4 oz if no bowel movement in 4 hours take additional 4 oz. 12/16/22   Pickenpack-Cousar, Arty Baumgartner, NP  morphine (MS CONTIN) 30 MG 12 hr tablet Take 1 tablet (30 mg total) by mouth every 12 (twelve) hours. 12/16/22   Pickenpack-Cousar, Arty Baumgartner, NP  ondansetron (ZOFRAN) 8 MG tablet Take 1 tablet (8 mg total) by mouth every 8 (eight) hours as needed. 12/15/22   Melven Sartorius, MD  pantoprazole (PROTONIX) 40 MG tablet Take 1 tablet (40 mg total) by mouth daily. 12/16/22   Pickenpack-Cousar, Arty Baumgartner, NP  polyethylene glycol (MIRALAX / GLYCOLAX) 17 g packet Take 17 g by mouth 2 (two) times daily. 12/16/22   Pickenpack-Cousar, Arty Baumgartner, NP  pregabalin (LYRICA) 75 MG capsule Take 75 mg by mouth 3 (three) times daily. 08/21/19   [provider]  promethazine (PHENERGAN) 25 MG tablet Take 25 mg by mouth every 12 (twelve) hours as needed for nausea or vomiting.    [provider]  senna-docusate (SENOKOT-S) 8.6-50 MG tablet Take 1 tablet by mouth 2 (two) times daily.    [provider]  sucralfate (CARAFATE) 1 g tablet Take 1 tablet (1 g total) by mouth 4 (four) times daily -  with meals and at bedtime. 12/16/22   Pickenpack-Cousar, Arty Baumgartner, NP      Allergies    Aspirin    Review of Systems   Review of Systems  Unable to perform ROS: Mental status change    Physical Exam Updated Vital Signs BP (!) 113/96 (BP Location: Left Arm)   Pulse (!) 147   Temp 98 F (36.7 C) (Oral)   Resp 16   SpO2 (!) 87%  Physical Exam Vitals and nursing note reviewed.  Constitutional:      Appearance: She is ill-appearing and toxic-appearing.  HENT:     Head: Normocephalic  and atraumatic.     Nose: Nose normal.     Mouth/Throat:     Mouth: Mucous membranes are moist.     Pharynx: No oropharyngeal exudate or posterior oropharyngeal erythema.  Eyes:     General:        Right eye: No discharge.        Left eye: No discharge.     Extraocular Movements: Extraocular movements intact.     Conjunctiva/sclera: Conjunctivae normal.     Pupils: Pupils are equal, round, and reactive to light.  Cardiovascular:     Rate and Rhythm: Regular rhythm. Tachycardia present.     Pulses: Normal pulses.     Heart sounds: No murmur heard. Pulmonary:     Effort: Pulmonary effort is normal. No respiratory distress.     Breath sounds: Normal breath sounds. No wheezing or rales.  Abdominal:     General: Bowel sounds are normal. There is no distension.     Palpations: Abdomen is soft.     Tenderness: There is no abdominal tenderness. There is no guarding or rebound.  Musculoskeletal:        General: No deformity.     Cervical back: Neck supple.  Skin:    General: Skin is warm and dry.     Capillary Refill: Capillary refill takes less than 2 seconds.  Neurological:     General: No focal deficit present.     Mental Status: She is alert and oriented to person, place, and time.     GCS: GCS eye subscore is 4. GCS verbal subscore is 5. GCS motor subscore is 6.     Cranial Nerves: Cranial nerves 2-12 are intact.     Sensory: Sensation is intact.     Motor: Motor function is intact.     Coordination: Coordination is intact.     Comments: Gait deferred for patient's safety.   Psychiatric:        Mood and Affect: Mood normal.     ED Results / Procedures / Treatments   Labs (all labs ordered are listed, but only abnormal results are displayed) Labs Reviewed - No data to display  EKG None  Radiology No results found.  Procedures .Critical Care  Performed by: Paris Lore, [name] Authorized by: Paris Lore, [name]   Critical care provider statement:     Critical care time (minutes):  45   Critical care was time spent personally by me on the following activities:  Development of treatment plan with patient or surrogate, discussions with consultants, evaluation of patient's response to treatment, examination of patient, obtaining history from patient or surrogate, ordering and performing treatments and interventions, ordering and review of laboratory studies, ordering and review of radiographic studies, pulse  oximetry and re-evaluation of patient's condition   {Document cardiac monitor, telemetry assessment procedure when appropriate:1}  Medications Ordered in ED Medications - No data to display  ED Course/ Medical Decision Making/ A&P   {   Click here for ABCD2, HEART and other calculatorsREFRESH Note before signing :1}                              Medical Decision Making 57 year old female currently on immunotherapy for metastatic melanoma who presents with concern for altered mental status.  Tachycardic to the 140s, hypoxic to the 80s and tachypneic on arrival.  Oxygen saturation improved to 97% on 2 L of oxygen by nasal cannula.  Cardiopulmonary exam significant only for tachycardia, abdominal exam is benign.  Neurologic exam is nonfocal.  Patient's coloring was very poor, extremely pale and lips were cyanotic at time of arrival to the bedside, coloring significantly improved throughout exam now on oxygen.  The differential diagnosis for AMS is extensive and includes, but is not limited to:  Drug overdose - opioids, alcohol, sedatives, antipsychotics, drug withdrawal, others Metabolic: hypoxia, hypoglycemia, hyperglycemia, hypercalcemia, hypernatremia, hyponatremia, uremia, hepatic encephalopathy, hypothyroidism, hyperthyroidism, vitamin B12 or thiamine deficiency, carbon monoxide poisoning, Wilson's disease, Lactic acidosis, DKA/HHOS Infectious: meningitis, encephalitis, bacteremia/sepsis, urinary tract infection, pneumonia,  neurosyphilis Structural: Space-occupying lesion, (brain tumor, subdural hematoma, hydrocephalus,) Vascular: stroke, subarachnoid hemorrhage, coronary ischemia, hypertensive encephalopathy, CNS vasculitis, thrombotic thrombocytopenic purpura, disseminated intravascular coagulation, hyperviscosity Psychiatric: Schizophrenia, depression; Other: Seizure, hypothermia, heat stroke, ICU psychosis, dementia -"sundowning."    ***  {Document critical care time when appropriate:1} {Document review of labs and clinical decision tools ie heart score, Chads2Vasc2 etc:1}  {Document your independent review of radiology images, and any outside records:1} {Document your discussion with family members, caretakers, and with consultants:1} {Document social determinants of health affecting pt's care:1} {Document your decision making why or why not admission, treatments were needed:1} Final Clinical Impression(s) / ED Diagnoses Final diagnoses:  None    Rx / DC Orders ED Discharge Orders     None

## 2022-12-27 NOTE — Progress Notes (Signed)
Pt being followed by [facility] for Sepsis protocol. 

## 2022-12-27 NOTE — ED Triage Notes (Signed)
Pt arrives with husband who reports pt has been confused since around 7am tonight reports she was "talking crazy things and not making sense." Pt has hx of cancer and is receiving immunotherapy. Pt denies any complaints in triage but reports she has been tired and not eating as much lately. Pt alert and oriented x4 in triage but it intermittently slow to respond. No focal weakness noted, speech is clear and face is symmetric. Pt hypoxic in triage and placed on [place] with improvement.

## 2022-12-28 ENCOUNTER — Ambulatory Visit
Admission: RE | Admit: 2022-12-28 | Discharge: 2022-12-28 | Disposition: A | Discharge status: Home / Self Care | Payer: BC Managed Care – PPO | Source: Ambulatory Visit | Attending: Radiation Oncology | Admitting: Radiation Oncology

## 2022-12-28 ENCOUNTER — Other Ambulatory Visit (HOSPITAL_COMMUNITY): Payer: Self-pay

## 2022-12-28 ENCOUNTER — Ambulatory Visit: Payer: BC Managed Care – PPO

## 2022-12-28 ENCOUNTER — Other Ambulatory Visit: Payer: Self-pay

## 2022-12-28 ENCOUNTER — Emergency Department (HOSPITAL_COMMUNITY): Payer: BC Managed Care – PPO

## 2022-12-28 ENCOUNTER — Encounter (HOSPITAL_COMMUNITY): Payer: Self-pay | Admitting: Family Medicine

## 2022-12-28 DIAGNOSIS — G893 Neoplasm related pain (acute) (chronic): Secondary | ICD-10-CM | POA: Diagnosis present

## 2022-12-28 DIAGNOSIS — G9340 Encephalopathy, unspecified: Secondary | ICD-10-CM | POA: Diagnosis present

## 2022-12-28 DIAGNOSIS — F17290 Nicotine dependence, other tobacco product, uncomplicated: Secondary | ICD-10-CM | POA: Diagnosis present

## 2022-12-28 DIAGNOSIS — Z801 Family history of malignant neoplasm of trachea, bronchus and lung: Secondary | ICD-10-CM | POA: Diagnosis not present

## 2022-12-28 DIAGNOSIS — N179 Acute kidney failure, unspecified: Secondary | ICD-10-CM | POA: Diagnosis present

## 2022-12-28 DIAGNOSIS — E860 Dehydration: Secondary | ICD-10-CM | POA: Diagnosis present

## 2022-12-28 DIAGNOSIS — C7931 Secondary malignant neoplasm of brain: Secondary | ICD-10-CM

## 2022-12-28 DIAGNOSIS — J9601 Acute respiratory failure with hypoxia: Secondary | ICD-10-CM | POA: Diagnosis present

## 2022-12-28 DIAGNOSIS — E7800 Pure hypercholesterolemia, unspecified: Secondary | ICD-10-CM | POA: Diagnosis present

## 2022-12-28 DIAGNOSIS — E869 Volume depletion, unspecified: Secondary | ICD-10-CM | POA: Diagnosis present

## 2022-12-28 DIAGNOSIS — C7800 Secondary malignant neoplasm of unspecified lung: Secondary | ICD-10-CM | POA: Diagnosis present

## 2022-12-28 DIAGNOSIS — E876 Hypokalemia: Secondary | ICD-10-CM | POA: Diagnosis present

## 2022-12-28 DIAGNOSIS — J439 Emphysema, unspecified: Secondary | ICD-10-CM | POA: Diagnosis present

## 2022-12-28 DIAGNOSIS — G9341 Metabolic encephalopathy: Secondary | ICD-10-CM | POA: Diagnosis present

## 2022-12-28 DIAGNOSIS — Z85820 Personal history of malignant melanoma of skin: Secondary | ICD-10-CM | POA: Diagnosis not present

## 2022-12-28 DIAGNOSIS — K5903 Drug induced constipation: Secondary | ICD-10-CM

## 2022-12-28 DIAGNOSIS — G928 Other toxic encephalopathy: Secondary | ICD-10-CM | POA: Diagnosis present

## 2022-12-28 DIAGNOSIS — Z923 Personal history of irradiation: Secondary | ICD-10-CM | POA: Diagnosis not present

## 2022-12-28 DIAGNOSIS — J9811 Atelectasis: Secondary | ICD-10-CM | POA: Diagnosis present

## 2022-12-28 DIAGNOSIS — M797 Fibromyalgia: Secondary | ICD-10-CM | POA: Diagnosis present

## 2022-12-28 DIAGNOSIS — C7951 Secondary malignant neoplasm of bone: Secondary | ICD-10-CM | POA: Diagnosis not present

## 2022-12-28 DIAGNOSIS — C439 Malignant melanoma of skin, unspecified: Secondary | ICD-10-CM

## 2022-12-28 DIAGNOSIS — Z510 Encounter for antineoplastic radiation therapy: Secondary | ICD-10-CM | POA: Diagnosis not present

## 2022-12-28 DIAGNOSIS — I10 Essential (primary) hypertension: Secondary | ICD-10-CM | POA: Diagnosis present

## 2022-12-28 DIAGNOSIS — C787 Secondary malignant neoplasm of liver and intrahepatic bile duct: Secondary | ICD-10-CM | POA: Diagnosis present

## 2022-12-28 DIAGNOSIS — Z1152 Encounter for screening for COVID-19: Secondary | ICD-10-CM | POA: Diagnosis not present

## 2022-12-28 DIAGNOSIS — E871 Hypo-osmolality and hyponatremia: Secondary | ICD-10-CM | POA: Diagnosis present

## 2022-12-28 LAB — URINALYSIS, W/ REFLEX TO CULTURE (INFECTION SUSPECTED)
Glucose, UA: NEGATIVE mg/dL
Hgb urine dipstick: NEGATIVE
Ketones, ur: NEGATIVE mg/dL
Leukocytes,Ua: NEGATIVE
Nitrite: NEGATIVE
Protein, ur: NEGATIVE mg/dL
Specific Gravity, Urine: 1.039 — ABNORMAL HIGH (ref 1.005–1.030)
pH: 5 (ref 5.0–8.0)

## 2022-12-28 LAB — CBC
HCT: 27.6 % — ABNORMAL LOW (ref 36.0–46.0)
Hemoglobin: 9.1 g/dL — ABNORMAL LOW (ref 12.0–15.0)
MCH: 31.3 pg (ref 26.0–34.0)
MCHC: 33 g/dL (ref 30.0–36.0)
MCV: 94.8 fL (ref 80.0–100.0)
Platelets: 227 10*3/uL (ref 150–400)
RBC: 2.91 MIL/uL — ABNORMAL LOW (ref 3.87–5.11)
RDW: 13.1 % (ref 11.5–15.5)
WBC: 5.4 10*3/uL (ref 4.0–10.5)
nRBC: 0.6 % — ABNORMAL HIGH (ref 0.0–0.2)

## 2022-12-28 LAB — COMPREHENSIVE METABOLIC PANEL
ALT: 41 U/L (ref 0–44)
AST: 90 U/L — ABNORMAL HIGH (ref 15–41)
Albumin: 2.3 g/dL — ABNORMAL LOW (ref 3.5–5.0)
Alkaline Phosphatase: 234 U/L — ABNORMAL HIGH (ref 38–126)
Anion gap: 8 (ref 5–15)
BUN: 13 mg/dL (ref 6–20)
CO2: 29 mmol/L (ref 22–32)
Calcium: 9.4 mg/dL (ref 8.9–10.3)
Chloride: 95 mmol/L — ABNORMAL LOW (ref 98–111)
Creatinine, Ser: 1.04 mg/dL — ABNORMAL HIGH (ref 0.44–1.00)
GFR, Estimated: >60 mL/min (ref >60)
Glucose, Bld: 122 mg/dL — ABNORMAL HIGH (ref 70–99)
Potassium: 3.1 mmol/L — ABNORMAL LOW (ref 3.5–5.1)
Sodium: 132 mmol/L — ABNORMAL LOW (ref 135–145)
Total Bilirubin: 0.8 mg/dL (ref <1.2)
Total Protein: 5.3 g/dL — ABNORMAL LOW (ref 6.5–8.1)

## 2022-12-28 LAB — PHOSPHORUS: Phosphorus: 2.6 mg/dL (ref 2.5–4.6)

## 2022-12-28 LAB — LACTIC ACID, PLASMA
Lactic Acid, Venous: 1.9 mmol/L (ref 0.5–1.9)
Lactic Acid, Venous: 1.4 mmol/L (ref 0.5–1.9)

## 2022-12-28 LAB — RAD ONC ARIA SESSION SUMMARY
Course Elapsed Days: 14
Plan Fractions Treated to Date: 10
Plan Prescribed Dose Per Fraction: 3 Gy
Plan Total Fractions Prescribed: 10
Plan Total Prescribed Dose: 30 Gy
Reference Point Dosage Given to Date: 30 Gy
Reference Point Session Dosage Given: 3 Gy
Session Number: 10

## 2022-12-28 LAB — RESP PANEL BY RT-PCR (RSV, FLU A&B, COVID)  RVPGX2
Influenza A by PCR: NEGATIVE
Influenza B by PCR: NEGATIVE
Resp Syncytial Virus by PCR: NEGATIVE
SARS Coronavirus 2 by RT PCR: NEGATIVE

## 2022-12-28 LAB — HIV ANTIBODY (ROUTINE TESTING W REFLEX): HIV Screen 4th Generation wRfx: NONREACTIVE

## 2022-12-28 LAB — MAGNESIUM: Magnesium: 2.2 mg/dL (ref 1.7–2.4)

## 2022-12-28 LAB — AMMONIA: Ammonia: 34 umol/L (ref 9–35)

## 2022-12-28 LAB — TSH: TSH: 0.894 u[IU]/mL (ref 0.350–4.500)

## 2022-12-28 MED ORDER — MORPHINE SULFATE ER 15 MG PO TBCR
15.0000 mg | EXTENDED_RELEASE_TABLET | Freq: Two times a day (BID) | ORAL | Status: DC
  Administered 2022-12-28 – 2022-12-29 (×3): 15 mg via ORAL
  Filled 2022-12-28 (×3): qty 1

## 2022-12-28 MED ORDER — POTASSIUM CHLORIDE 10 MEQ/100ML IV SOLN
10.0000 meq | INTRAVENOUS | Status: AC
  Administered 2022-12-28 (×3): 10 meq via INTRAVENOUS
  Filled 2022-12-28 (×3): qty 100

## 2022-12-28 MED ORDER — FENTANYL CITRATE PF 50 MCG/ML IJ SOSY
25.0000 ug | PREFILLED_SYRINGE | Freq: Once | INTRAMUSCULAR | Status: AC
  Administered 2022-12-28: 25 ug via INTRAVENOUS
  Filled 2022-12-28: qty 1

## 2022-12-28 MED ORDER — OXYCODONE HCL 5 MG PO TABS: 5.0000 mg | ORAL_TABLET | ORAL | Status: DC | PRN

## 2022-12-28 MED ORDER — ZOLPIDEM TARTRATE 5 MG PO TABS
5.0000 mg | ORAL_TABLET | Freq: Every evening | ORAL | Status: DC | PRN
  Administered 2022-12-28: 5 mg via ORAL
  Filled 2022-12-28: qty 1

## 2022-12-28 MED ORDER — IPRATROPIUM-ALBUTEROL 0.5-2.5 (3) MG/3ML IN SOLN: 3.0000 mL | Freq: Four times a day (QID) | RESPIRATORY_TRACT | Status: DC | PRN

## 2022-12-28 MED ORDER — POLYETHYLENE GLYCOL 3350 17 G PO PACK
17.0000 g | PACK | Freq: Two times a day (BID) | ORAL | Status: DC
  Administered 2022-12-28 (×3): 17 g via ORAL
  Filled 2022-12-28 (×3): qty 1

## 2022-12-28 MED ORDER — HYDROMORPHONE HCL 1 MG/ML IJ SOLN
0.5000 mg | INTRAMUSCULAR | Status: DC | PRN
Start: 2022-12-28 — End: 2022-12-29

## 2022-12-28 MED ORDER — ONDANSETRON HCL 4 MG/2ML IJ SOLN: 4.0000 mg | Freq: Four times a day (QID) | INTRAMUSCULAR | Status: DC | PRN

## 2022-12-28 MED ORDER — SENNOSIDES-DOCUSATE SODIUM 8.6-50 MG PO TABS
1.0000 | ORAL_TABLET | Freq: Two times a day (BID) | ORAL | Status: DC
  Administered 2022-12-28 – 2022-12-29 (×4): 1 via ORAL
  Filled 2022-12-28 (×4): qty 1

## 2022-12-28 MED ORDER — LACTULOSE 10 GM/15ML PO SOLN: 10.0000 g | Freq: Two times a day (BID) | ORAL | Status: DC | PRN

## 2022-12-28 MED ORDER — PANTOPRAZOLE SODIUM 40 MG PO TBEC
40.0000 mg | DELAYED_RELEASE_TABLET | Freq: Every day | ORAL | Status: DC
Start: 2022-12-28 — End: 2022-12-29
  Administered 2022-12-28 – 2022-12-29 (×2): 40 mg via ORAL
  Filled 2022-12-28 (×2): qty 1

## 2022-12-28 MED ORDER — ACETAMINOPHEN 325 MG PO TABS: 650.0000 mg | ORAL_TABLET | Freq: Four times a day (QID) | ORAL | Status: DC | PRN

## 2022-12-28 MED ORDER — SUCRALFATE 1 G PO TABS
1.0000 g | ORAL_TABLET | Freq: Three times a day (TID) | ORAL | Status: DC
Start: 2022-12-28 — End: 2022-12-29
  Administered 2022-12-28 – 2022-12-29 (×6): 1 g via ORAL
  Filled 2022-12-28 (×6): qty 1

## 2022-12-28 MED ORDER — IOHEXOL 350 MG/ML SOLN
100.0000 mL | Freq: Once | INTRAVENOUS | Status: AC | PRN
  Administered 2022-12-28: 100 mL via INTRAVENOUS

## 2022-12-28 MED ORDER — ONDANSETRON HCL 4 MG/2ML IJ SOLN
4.0000 mg | Freq: Once | INTRAMUSCULAR | Status: AC
  Administered 2022-12-28: 4 mg via INTRAVENOUS
  Filled 2022-12-28: qty 2

## 2022-12-28 MED ORDER — CYCLOBENZAPRINE HCL 5 MG PO TABS: 5.0000 mg | ORAL_TABLET | Freq: Three times a day (TID) | ORAL | Status: DC | PRN

## 2022-12-28 MED ORDER — ACETAMINOPHEN 650 MG RE SUPP: 650.0000 mg | Freq: Four times a day (QID) | RECTAL | Status: DC | PRN

## 2022-12-28 MED ORDER — MAGNESIUM SULFATE IN D5W 1-5 GM/100ML-% IV SOLN
1.0000 g | Freq: Once | INTRAVENOUS | Status: AC
  Administered 2022-12-28: 1 g via INTRAVENOUS
  Filled 2022-12-28: qty 100

## 2022-12-28 MED ORDER — POTASSIUM CHLORIDE CRYS ER 20 MEQ PO TBCR
40.0000 meq | EXTENDED_RELEASE_TABLET | Freq: Once | ORAL | Status: AC
  Administered 2022-12-28: 40 meq via ORAL
  Filled 2022-12-28: qty 2

## 2022-12-28 MED ORDER — SODIUM CHLORIDE 0.9% FLUSH
3.0000 mL | Freq: Two times a day (BID) | INTRAVENOUS | Status: DC
  Administered 2022-12-28 (×2): 3 mL via INTRAVENOUS

## 2022-12-28 MED ORDER — ONDANSETRON HCL 4 MG PO TABS: 4.0000 mg | ORAL_TABLET | Freq: Four times a day (QID) | ORAL | Status: DC | PRN

## 2022-12-28 MED ORDER — ENOXAPARIN SODIUM 40 MG/0.4ML IJ SOSY
40.0000 mg | PREFILLED_SYRINGE | INTRAMUSCULAR | Status: DC
  Administered 2022-12-28: 40 mg via SUBCUTANEOUS
  Filled 2022-12-28 (×2): qty 0.4

## 2022-12-28 MED ORDER — LACTATED RINGERS IV SOLN: INTRAVENOUS | Status: AC

## 2022-12-28 NOTE — ED Notes (Signed)
Patient transported for radiation treatment by oncology staff, [name], RN.

## 2022-12-28 NOTE — Consult Note (Signed)
[facility] ADMISSION NOTE  Patient Care Team: [name], [name] as PCP - General (Family Medicine) Pickenpack-Cousar, Arty Baumgartner, NP as Nurse Practitioner (Hospice and Palliative Medicine)   ASSESSMENT & PLAN:   Confusion Improved on hydration Suspect from morphine in the setting of AKI/dehydration  AKI Improved on hydration Fluid goal 70 ounces per day  Brain metastases Not a primary cause of confusion. Patient will be monitored as outpatient in the future after 2 months of treatment  Metastatic malignant melanoma (HCC) Extensive metastases with large burden.   Will continue ipi/nivo as able.  Only received 1 dose so far so too early to judge systemic progression.  Spoke to patient and family, it takes about 2 months to see response. Repeat CMP in the morning  Pain from bone metastases Appear better controlled. Agree decrease morphine to 15 mg twice daily for now Oxycodone in between for breakthrough pain and monitor for constipation, confusion.  Discharge planning If continue to improve tomorrow, may discharge.  She has follow-up with [place] later this week.  All questions were answered.  Communicated with husband and family members at bedside.    Melven Sartorius, MD 12/28/2022 5:17 PM   CHIEF COMPLAINTS/PURPOSE OF ADMISSION Metastatic melanoma  HISTORY OF PRESENTING ILLNESS:  Laura [name] 57 y.o. female is admitted for   Summary of oncologic history as follows: Oncology History  Metastatic malignant melanoma (HCC)  11/16/2022 Imaging   She presented to the emergency room on 11/16/2022 for back pain x 2 weeks and central chest pain for several months   CTA CAP IMPRESSION: 1. Negative for aortic dissection or aneurysm. 2. 1.2 cm left upper lobe nodule.  For 3. Multiple ill-defined liver lesions, suspicious for metastatic disease. 4. Multiple lytic osseous lesions, suspicious for metastatic disease. Pathologic fracture of the superior endplate  of L5   57/84/6962 Pathology Results   1. Liver, needle/core biopsy, Left hepatic lobe lesion :       - METASTATIC MELANOMA.    12/04/2022 Initial Diagnosis   Melanoma of skin (HCC) Liver, bone metastases. 1.2 cm LUL nodule   12/09/2022 PET scan   PET: Diffuse bone and liver metastases.  Lung metastases.    12/11/2022 -  Chemotherapy   Patient is on Treatment Plan : MELANOMA Nivolumab (1) + Ipilimumab (3) q21d / Nivolumab (480) q28d     12/17/2022 Imaging   MRI brain 1. Three small right hemisphere brain metastases metastases ranging from 2 mm to 4 mm. Minimal hemosiderin. No associated cerebral edema or mass effect. No other metastatic disease identified in the brain.   2. Known osseous metastatic disease but no destructive lesion identified in the skull or visible cervical spine.   3. Moderate for age cerebral white matter changes most commonly due to small vessel disease.     MEDICAL HISTORY:  Past Medical History:  Diagnosis Date   Anxiety    Arthritis    Complication of anesthesia    Fibromyalgia    Gallstones 04/20/2018   GERD (gastroesophageal reflux disease)    History of blood transfusion    post Hysterectomy   Hypercholesteremia    Melanoma (HCC)    on back and excised   Migraine    PONV (postoperative nausea and vomiting)    Tobacco abuse    Vertebral artery disease (HCC)    history of left vertebral artery dissection 2011    SURGICAL HISTORY: Past Surgical History:  Procedure Laterality Date   ABDOMINAL HYSTERECTOMY  ANTERIOR FUSION CERVICAL SPINE  2008   BONE GRAFT HIP ILIAC CREST Left    Left Hip to Left Wrist   CHOLECYSTECTOMY N/A 04/20/2018   Procedure: LAPAROSCOPIC CHOLECYSTECTOMY;  Surgeon: Claud Kelp, MD;  Location: [facility] OR;  Service: General;  Laterality: N/A;   RADIOLOGY WITH ANESTHESIA N/A 12/17/2022   Procedure: MRI BRAIN WITH AND WITHOUT CONTRAST, MRI LUMBAR SPINE WITH AND WITHOUT CONTRAST WITH ANESTHESIA;  Surgeon:  Radiologist, Medication, MD;  Location: [facility] OR;  Service: Radiology;  Laterality: N/A;   thumb surgery Left 2018   [facility]- Tendons Carpal tunnel   WRIST SURGERY  1994    SOCIAL HISTORY: Social History   Socioeconomic History   Marital status: Married    Spouse name: Not on file   Number of children: Not on file   Years of education: Not on file   Highest education level: Not on file  Occupational History   Not on file  Tobacco Use   Smoking status: Former    Current packs/day: 0.00    Average packs/day: 1 pack/day for 20.0 years (20.0 ttl pk-yrs)    Types: Cigarettes    Start date: 75    Quit date: 2018    Years since quitting: 6.9   Smokeless tobacco: Never  Vaping Use   Vaping status: Every Day   Substances: Nicotine  Substance and Sexual Activity   Alcohol use: No    Alcohol/week: 0.0 standard drinks of alcohol   Drug use: No   Sexual activity: Not on file  Other Topics Concern   Not on file  Social History Narrative   Married,  3 kids.   Lives home with husband,  Works at [employer].  Education 12th grade.  Caffeine pepsi daily and tea daily.   '   Social Determinants of Health   Financial Resource Strain: Not on file  Food Insecurity: No Food Insecurity (12/28/2022)   Hunger Vital Sign    Worried About Running Out of Food in the Last Year: Never true    Ran Out of Food in the Last Year: Never true  Transportation Needs: No Transportation Needs (12/28/2022)   PRAPARE - Administrator, Civil Service (Medical): No    Lack of Transportation (Non-Medical): No  Physical Activity: Not on file  Stress: Not on file  Social Connections: Not on file  Intimate Partner Violence: Not At Risk (12/28/2022)   Humiliation, Afraid, Rape, and Kick questionnaire    Fear of Current or Ex-Partner: No    Emotionally Abused: No    Physically Abused: No    Sexually Abused: No    FAMILY HISTORY: Family History  Problem Relation Age of Onset   Lung cancer Father         smoking hx   Breast cancer Sister        diagnosed over the age of 56   Breast cancer Sister        diagnosed over the age of 73    ALLERGIES:  is allergic to aspirin.  MEDICATIONS:  Current Facility-Administered Medications  Medication Dose Route Frequency Provider Last Rate Last Admin   acetaminophen (TYLENOL) tablet 650 mg  650 mg Oral Q6H PRN Opyd, Lavone Neri, MD       Or   acetaminophen (TYLENOL) suppository 650 mg  650 mg Rectal Q6H PRN Opyd, Lavone Neri, MD       cyclobenzaprine (FLEXERIL) tablet 5 mg  5 mg Oral TID PRN Opyd, Lavone Neri, MD  enoxaparin (LOVENOX) injection 40 mg  40 mg Subcutaneous Q24H Opyd, Lavone Neri, MD   40 mg at 12/28/22 1128   HYDROmorphone (DILAUDID) injection 0.5-1 mg  0.5-1 mg Intravenous Q2H PRN Opyd, Lavone Neri, MD       ipratropium-albuterol (DUONEB) 0.5-2.5 (3) MG/3ML nebulizer solution 3 mL  3 mL Nebulization Q6H PRN Opyd, Lavone Neri, MD       lactated ringers infusion   Intravenous Continuous Opyd, Lavone Neri, MD 125 mL/hr at 12/28/22 0743 New Bag at 12/28/22 0743   lactulose (CHRONULAC) 10 GM/15ML solution 10 g  10 g Oral BID PRN Opyd, Lavone Neri, MD       morphine (MS CONTIN) 12 hr tablet 15 mg  15 mg Oral Q12H Opyd, Lavone Neri, MD   15 mg at 12/28/22 1126   ondansetron (ZOFRAN) tablet 4 mg  4 mg Oral Q6H PRN Opyd, Lavone Neri, MD       Or   ondansetron (ZOFRAN) injection 4 mg  4 mg Intravenous Q6H PRN Opyd, Lavone Neri, MD       oxyCODONE (Oxy IR/ROXICODONE) immediate release tablet 5 mg  5 mg Oral Q4H PRN Opyd, Lavone Neri, MD       pantoprazole (PROTONIX) EC tablet 40 mg  40 mg Oral Daily Opyd, Lavone Neri, MD   40 mg at 12/28/22 1127   polyethylene glycol (MIRALAX / GLYCOLAX) packet 17 g  17 g Oral BID Opyd, Lavone Neri, MD   17 g at 12/28/22 1128   senna-docusate (Senokot-S) tablet 1 tablet  1 tablet Oral BID Opyd, Lavone Neri, MD   1 tablet at 12/28/22 1127   sodium chloride flush (NS) 0.9 % injection 3 mL  3 mL Intravenous Q12H Opyd, Lavone Neri, MD   3 mL at  12/28/22 1035   sucralfate (CARAFATE) tablet 1 g  1 g Oral TID WC & HS Opyd, Lavone Neri, MD   1 g at 12/28/22 1128   zolpidem (AMBIEN) tablet 5 mg  5 mg Oral QHS PRN Opyd, Lavone Neri, MD        REVIEW OF SYSTEMS:   Constitutional: Denies fevers, weight loss or abnormal night sweats Eyes: Denies visual change Ears, nose, mouth, throat, and face: Denies sore throat or enlarged tongue Respiratory: Denies cough, shortness of breath or wheezes Cardiovascular: Denies palpitation, chest discomfort or chest pain Gastrointestinal:  Denies nausea, vomiting, diarrhea, constipation, heartburn or abdominal pain GU: Denies any hesitancy, dysuria, frequency, hematuria Skin: Denies abnormal skin rashes Lymphatics: Denies new lymphadenopathy or mass Neurological: Denies numbness, tingling or new weaknesses All other systems were reviewed with the patient and are negative.  PHYSICAL EXAMINATION: ECOG PERFORMANCE STATUS: 2 - Symptomatic, <50% confined to bed  Vitals:   12/28/22 1500 12/28/22 1601  BP: 103/69 112/69  Pulse: (!) 111 (!) 110  Resp: 17 18  Temp: 99.1 F (37.3 C) 97.9 F (36.6 C)  SpO2: 98% 96%   Filed Weights   12/27/22 2248  Weight: 140 lb (63.5 kg)    GENERAL: alert, no distress and comfortable SKIN: skin color normal. No jaundice EYES: normal, sclera clear OROPHARYNX: no exudate, dry NECK: supple. No mass LUNGS: clear to auscultation and normal breathing effort.  No wheeze or rales HEART: regular rate & rhythm and no murmurs ABDOMEN:abdomen soft, non-tender and non-distended Musculoskeletal:  no lower extremity edema NEURO: alert with fluent speech; no focal motor/sensory deficits Strength and sensation equal bilaterally  LABORATORY DATA:  I have reviewed the data as listed Lab  Results  Component Value Date   WBC 5.4 12/28/2022   HGB 9.1 (L) 12/28/2022   HCT 27.6 (L) 12/28/2022   MCV 94.8 12/28/2022   PLT 227 12/28/2022   Recent Labs    12/09/22 1528  12/27/22 2246 12/28/22 0729  NA 136 132* 132*  K 3.8 2.7* 3.1*  CL 94* 89* 95*  CO2 34* 30 29  GLUCOSE 99 135* 122*  BUN 14 18 13   CREATININE 1.26* 1.37* 1.04*  CALCIUM 10.7* 10.6* 9.4  GFRNONAA 51* 46* >60  PROT 7.7 7.2 5.3*  ALBUMIN 4.3 3.0* 2.3*  AST 63* 128* 90*  ALT 26 54* 41  ALKPHOS 158* 334* 234*  BILITOT 1.2* 1.1 0.8    RADIOGRAPHIC STUDIES: I have personally reviewed the radiological images as listed and agreed with the findings in the report. CT Angio Chest PE W and/or Wo Contrast  Result Date: 12/28/2022 CLINICAL DATA:  Patient with metastatic melanoma, new transaminitis, pulmonary embolism suspected. EXAM: CT ANGIOGRAPHY CHEST CT ABDOMEN AND PELVIS WITH CONTRAST TECHNIQUE: Multidetector CT imaging of the chest was performed using the standard protocol during bolus administration of intravenous contrast. Multiplanar CT image reconstructions and MIPs were obtained to evaluate the vascular anatomy. Multidetector CT imaging of the abdomen and pelvis was performed using the standard protocol during bolus administration of intravenous contrast. RADIATION DOSE REDUCTION: This exam was performed according to the departmental dose-optimization program which includes automated exposure control, adjustment of the mA and/or kV according to patient size and/or use of iterative reconstruction technique. CONTRAST:  OMNIPAQUE IOHEXOL 350 MG/ML SOLN COMPARISON:  PET-CT recently 12/09/2022, and CTA chest, abdomen and pelvis 11/16/2022. FINDINGS: CTA CHEST FINDINGS Cardiovascular: The cardiac size is normal. No arterial dilatation or embolism is seen. Minimal pericardial effusion anteriorly. Scattered calcific plaques lad coronary artery. Mild aortic atherosclerosis. There is no aneurysm, stenosis or dissection. The great vessels are widely patent. Pulmonary veins are nondistended. Mediastinum/Nodes: Enlarged superior left hilar lymph node is 1.3 cm in short axis, previously 1.1 cm. No other  intrathoracic adenopathy is seen. 1.2 cm hypodense nodule in the inferior pole of the left lobe of the thyroid gland is unchanged. No follow-up imaging is recommended. Lungs/Pleura: 1.5 cm pulmonary metastasis left upper lobe, level of the aortic arch, unchanged from the PET-CT, 11/16/2022 was 1.2 cm. There are several new nodules in the right middle lobe, most are 3 mm or smaller for example on series 13 axial images 80 6-99. Largest of these is 5 mm anteriorly on 13:90, concerning for new metastases. In the right lower lobe there is a new 5 mm nodule about the fissure on 13:90. Mild paraseptal emphysema again seen in the lung apices. There are coarse atelectatic bands in the posterior bases. On the left there is increased conspicuity of fissural nodules in the lower lung field, largest of these is about 3 mm. There is a new nodule in the left lower lobe measuring 5 mm on 13:99 and a new 3 mm apical left lung nodule on 13:44. There is no pleural effusion, no other new lung abnormality. No focal consolidation. Musculoskeletal: There is chronic interbody fusion hardware in the lower cervical spine. Multifocal lytic metastatic disease particularly in the thoracic spine but also in the ribs and sternum, with pathologic compression fracture of the T3 vertebral body, worsening lytic metastasis T4 body with extension into the spinal canal and increased erosion through the posterior cortex, with increased soft tissue infiltration into the left T3-4 foramen. Similar increased posterior cortical  erosion and metastatic extension into the spinal canal at T8. Mass effect on the cord could be present at these levels. Review of the MIP images confirms the above findings. CT ABDOMEN and PELVIS FINDINGS Hepatobiliary: Innumerable diffuse metastases to the liver largest is in segment 2, today 7.5 cm, on the PET-CT 7.4 cm, the next largest is medially in segment 5 measuring 3.9 cm, previously 3.3 cm. Gallbladder is absent without  bile duct dilatation. Pancreas: No abnormality. Spleen: New scattered nonspecific subcentimeter hypodensities are noted in the inferior aspect of the spleen, possible metastases. Largest is 8 mm on 19:24. Adrenals/Urinary Tract: Stable 1 cm left adrenal nodule, was not hypermetabolic on PET-CT. No right adrenal or broad or renal mass. No urinary stone or obstruction. Unremarkable bladder for the degree of distention. Stomach/Bowel: Unremarkable contracted stomach. Normal caliber unopacified small bowel. Normal appendix. Moderate fecal stasis ascending and transverse colon. Distal descending colon and sigmoid either demonstrating mild wall thickening or underdistention. Unremarkable rectum. Vascular/Lymphatic: Aortic atherosclerosis. No enlarged abdominal or pelvic lymph nodes. Reproductive: Status post hysterectomy. No adnexal masses. Multiple pelvic phleboliths. Other: No ascites or free air, no incarcerated hernia. Musculoskeletal: Progressive lytic metastatic disease, including compared with 12/09/2022, is noted at multiple levels of the lumbar spine, throughout the sacrum and bony pelvis, and with multiple small lytic lesions in both proximal femurs. There is increased pathologic compression fracture of the L5 vertebral body with soft tissue herniation into the spinal canal severely narrowing the spinal canal from the L4-5 to the L5-S1 level. There is an enhancing mass in the right dorsal paraspinous musculature at L2-3 measuring 2.3 cm and was seen on 12/09/2022. Destructive mass of S3 to the left today measures 3 cm, previously 2.6 cm. Review of the MIP images confirms the above findings. IMPRESSION: 1. No evidence of arterial dilatation or embolism. 2. 1.5 cm pulmonary metastasis left upper lobe, unchanged from the PET-CT, 1.2 cm on 11/16/2022. 3. Multiple new nodules in the right middle lobe, right lower lobe, and left apical lung, largest 5 mm, concerning for new metastases. 4. Increased conspicuity of  fissural nodules in the left lower lung field. 5. Increased size of a left hilar lymph node. 6. Innumerable diffuse metastases to the liver, largest 7.5 cm in segment 2, previously 7.4 cm. 7. New scattered subcentimeter hypodensities in the inferior spleen, possible metastases. 8. Progressive lytic metastatic disease throughout the spine, sacrum, bony pelvis, and proximal femurs. 9. Increased pathologic compression fracture of the T3 and L5 vertebral body with soft tissue herniation into the spinal canal severely narrowing the spinal canal from the L4-5 to the L5-S1 level, and in the thoracic spine with increased soft tissue infiltration of the spinal canal at T4 and T8, and in the left T3-4 foramen. 10. Stable 2.3 cm enhancing mass in the right dorsal paraspinous musculature at L2-3. 11. Constipation. 12. Distal descending colon and sigmoid either demonstrating mild wall thickening or underdistention. Correlate clinically for underlying colitis. 13. Aortic and coronary artery atherosclerosis. Aortic Atherosclerosis (ICD10-I70.0) and Emphysema (ICD10-J43.9). Electronically Signed   By: Almira Bar M.D.   On: 12/28/2022 02:39   CT ABDOMEN PELVIS W CONTRAST  Result Date: 12/28/2022 CLINICAL DATA:  Patient with metastatic melanoma, new transaminitis, pulmonary embolism suspected. EXAM: CT ANGIOGRAPHY CHEST CT ABDOMEN AND PELVIS WITH CONTRAST TECHNIQUE: Multidetector CT imaging of the chest was performed using the standard protocol during bolus administration of intravenous contrast. Multiplanar CT image reconstructions and MIPs were obtained to evaluate the vascular anatomy. Multidetector CT  imaging of the abdomen and pelvis was performed using the standard protocol during bolus administration of intravenous contrast. RADIATION DOSE REDUCTION: This exam was performed according to the departmental dose-optimization program which includes automated exposure control, adjustment of the mA and/or kV according to  patient size and/or use of iterative reconstruction technique. CONTRAST:  OMNIPAQUE IOHEXOL 350 MG/ML SOLN COMPARISON:  PET-CT recently 12/09/2022, and CTA chest, abdomen and pelvis 11/16/2022. FINDINGS: CTA CHEST FINDINGS Cardiovascular: The cardiac size is normal. No arterial dilatation or embolism is seen. Minimal pericardial effusion anteriorly. Scattered calcific plaques lad coronary artery. Mild aortic atherosclerosis. There is no aneurysm, stenosis or dissection. The great vessels are widely patent. Pulmonary veins are nondistended. Mediastinum/Nodes: Enlarged superior left hilar lymph node is 1.3 cm in short axis, previously 1.1 cm. No other intrathoracic adenopathy is seen. 1.2 cm hypodense nodule in the inferior pole of the left lobe of the thyroid gland is unchanged. No follow-up imaging is recommended. Lungs/Pleura: 1.5 cm pulmonary metastasis left upper lobe, level of the aortic arch, unchanged from the PET-CT, 11/16/2022 was 1.2 cm. There are several new nodules in the right middle lobe, most are 3 mm or smaller for example on series 13 axial images 80 6-99. Largest of these is 5 mm anteriorly on 13:90, concerning for new metastases. In the right lower lobe there is a new 5 mm nodule about the fissure on 13:90. Mild paraseptal emphysema again seen in the lung apices. There are coarse atelectatic bands in the posterior bases. On the left there is increased conspicuity of fissural nodules in the lower lung field, largest of these is about 3 mm. There is a new nodule in the left lower lobe measuring 5 mm on 13:99 and a new 3 mm apical left lung nodule on 13:44. There is no pleural effusion, no other new lung abnormality. No focal consolidation. Musculoskeletal: There is chronic interbody fusion hardware in the lower cervical spine. Multifocal lytic metastatic disease particularly in the thoracic spine but also in the ribs and sternum, with pathologic compression fracture of the T3 vertebral body,  worsening lytic metastasis T4 body with extension into the spinal canal and increased erosion through the posterior cortex, with increased soft tissue infiltration into the left T3-4 foramen. Similar increased posterior cortical erosion and metastatic extension into the spinal canal at T8. Mass effect on the cord could be present at these levels. Review of the MIP images confirms the above findings. CT ABDOMEN and PELVIS FINDINGS Hepatobiliary: Innumerable diffuse metastases to the liver largest is in segment 2, today 7.5 cm, on the PET-CT 7.4 cm, the next largest is medially in segment 5 measuring 3.9 cm, previously 3.3 cm. Gallbladder is absent without bile duct dilatation. Pancreas: No abnormality. Spleen: New scattered nonspecific subcentimeter hypodensities are noted in the inferior aspect of the spleen, possible metastases. Largest is 8 mm on 19:24. Adrenals/Urinary Tract: Stable 1 cm left adrenal nodule, was not hypermetabolic on PET-CT. No right adrenal or broad or renal mass. No urinary stone or obstruction. Unremarkable bladder for the degree of distention. Stomach/Bowel: Unremarkable contracted stomach. Normal caliber unopacified small bowel. Normal appendix. Moderate fecal stasis ascending and transverse colon. Distal descending colon and sigmoid either demonstrating mild wall thickening or underdistention. Unremarkable rectum. Vascular/Lymphatic: Aortic atherosclerosis. No enlarged abdominal or pelvic lymph nodes. Reproductive: Status post hysterectomy. No adnexal masses. Multiple pelvic phleboliths. Other: No ascites or free air, no incarcerated hernia. Musculoskeletal: Progressive lytic metastatic disease, including compared with 12/09/2022, is noted at multiple levels  of the lumbar spine, throughout the sacrum and bony pelvis, and with multiple small lytic lesions in both proximal femurs. There is increased pathologic compression fracture of the L5 vertebral body with soft tissue herniation into the  spinal canal severely narrowing the spinal canal from the L4-5 to the L5-S1 level. There is an enhancing mass in the right dorsal paraspinous musculature at L2-3 measuring 2.3 cm and was seen on 12/09/2022. Destructive mass of S3 to the left today measures 3 cm, previously 2.6 cm. Review of the MIP images confirms the above findings. IMPRESSION: 1. No evidence of arterial dilatation or embolism. 2. 1.5 cm pulmonary metastasis left upper lobe, unchanged from the PET-CT, 1.2 cm on 11/16/2022. 3. Multiple new nodules in the right middle lobe, right lower lobe, and left apical lung, largest 5 mm, concerning for new metastases. 4. Increased conspicuity of fissural nodules in the left lower lung field. 5. Increased size of a left hilar lymph node. 6. Innumerable diffuse metastases to the liver, largest 7.5 cm in segment 2, previously 7.4 cm. 7. New scattered subcentimeter hypodensities in the inferior spleen, possible metastases. 8. Progressive lytic metastatic disease throughout the spine, sacrum, bony pelvis, and proximal femurs. 9. Increased pathologic compression fracture of the T3 and L5 vertebral body with soft tissue herniation into the spinal canal severely narrowing the spinal canal from the L4-5 to the L5-S1 level, and in the thoracic spine with increased soft tissue infiltration of the spinal canal at T4 and T8, and in the left T3-4 foramen. 10. Stable 2.3 cm enhancing mass in the right dorsal paraspinous musculature at L2-3. 11. Constipation. 12. Distal descending colon and sigmoid either demonstrating mild wall thickening or underdistention. Correlate clinically for underlying colitis. 13. Aortic and coronary artery atherosclerosis. Aortic Atherosclerosis (ICD10-I70.0) and Emphysema (ICD10-J43.9). Electronically Signed   By: Almira Bar M.D.   On: 12/28/2022 02:39   CT HEAD WO CONTRAST ( )  Result Date: 12/27/2022 CLINICAL DATA:  Delirium.  History of brain metastases. EXAM: CT HEAD WITHOUT CONTRAST  TECHNIQUE: Contiguous axial images were obtained from the base of the skull through the vertex without intravenous contrast. RADIATION DOSE REDUCTION: This exam was performed according to the departmental dose-optimization program which includes automated exposure control, adjustment of the mA and/or kV according to patient size and/or use of iterative reconstruction technique. COMPARISON:  Head CT 11/11/2022.  MRI brain 12/17/2022. FINDINGS: Brain: No evidence of acute infarction, hemorrhage, hydrocephalus, or extra-axial fluid collection. There are 2 rounded hyperdense metastatic lesions in the right frontal cortex seen best on coronal imaging. These are unchanged from prior MRI. There is also 2 mm hyperdense metastatic lesion in the right occipital region best seen on coronal image 5/46. This is not well appreciated on prior MRI. There is no mass effect. Minimal patchy periventricular white matter hypodensity appears similar to prior. Vascular: No hyperdense vessel or unexpected calcification. Skull: Normal. Negative for fracture or focal lesion. Sinuses/Orbits: No acute finding. Other: None. IMPRESSION: 1. No acute intracranial process. 2. Two stable rounded hyperdense metastatic lesions in the right frontal cortex. There is also a 2 mm hyperdense metastatic lesion in the right occipital region which is not well appreciated on prior MRI. No mass effect. Electronically Signed   By: Darliss Cheney M.D.   On: 12/27/2022 23:20   DG Chest Port 1 View  Result Date: 12/27/2022 CLINICAL DATA:  Questionable sepsis - evaluate for abnormality EXAM: PORTABLE CHEST 1 VIEW COMPARISON:  Chest x-ray 11/16/2022, CT angio chest 11/16/2022, PET  CT 12/09/2022 FINDINGS: The heart and mediastinal contours are within normal limits. Aortic calcification. Redemonstration of an approximate 1.6 cm left upper quadrant pulmonary better evaluated on PET-CT 12/09/2022. No focal consolidation. No pulmonary edema. No pleural effusion. No  pneumothorax. No acute osseous abnormality.  Cervical spine surgical hardware. IMPRESSION: 1. No acute cardiopulmonary abnormality. 2. Redemonstration of an approximate 1.6 cm left upper quadrant pulmonary better evaluated on PET-CT 12/09/2022. 3.  Aortic Atherosclerosis (ICD10-I70.0). Electronically Signed   By: Tish Frederickson M.D.   On: 12/27/2022 23:05   MR BRAIN W WO CONTRAST  Addendum Date: 12/17/2022   ADDENDUM REPORT: 12/17/2022 13:41 ADDENDUM: Study discussed by telephone with [name] COURTNEY WHARTON on 12/17/2022 at 1332 hours. Electronically Signed   By: Odessa Fleming M.D.   On: 12/17/2022 13:41   Result Date: 12/17/2022 CLINICAL DATA:  57 year old female with metastatic melanoma. Staging. Study under anesthesia. EXAM: MRI HEAD WITHOUT AND WITH CONTRAST TECHNIQUE: Multiplanar, multiecho pulse sequences of the brain and surrounding structures were obtained without and with intravenous contrast. CONTRAST:  5mL GADAVIST GADOBUTROL 1 MMOL/ML IV SOLN COMPARISON:  PET-CT 12/09/2022.  Head CT 11/11/2022. FINDINGS: Brain: Several small intrinsic T1 hyperintense (series 8, image 37) and/or enhancing (series 19, image 30) brain lesions compatible with small melanoma metastases. Only 1 of these demonstrates evidence of hemosiderin on SWI (series 7, image 72). No associated cerebral edema or mass effect. Three such small metastases are identified including 3-4 mm at the right inferior frontal gyrus on series 19, image 30, 3-4 mm at the right operculum image 35, and 2-3 mm at the right middle frontal gyrus on image 38. Although those lesions are relatively clustered, no additional brain metastases, abnormal enhancement, or abnormal susceptibility is identified elsewhere. No dural thickening. No superimposed restricted diffusion to suggest acute infarction. No midline shift, ventriculomegaly, extra-axial collection or acute intracranial hemorrhage. Cervicomedullary junction and pituitary are within normal limits.  Scattered nonenhancing white matter T2 and FLAIR hyperintensity is moderate for age, nonspecific but most commonly small vessel related. Vascular: Major intracranial vascular flow voids are preserved. Dominant distal left vertebral artery. Following contrast major dural venous sinuses are enhancing and appear to be patent. Skull and upper cervical spine: Heterogeneous background bone marrow signal. Known osseous metastatic disease, but no destructive lesion identified in the skull or visible cervical spine. Grossly negative visible cervical spinal cord. Sinuses/Orbits: Negative orbits. Minor paranasal sinus mucosal thickening. Other: Minimal left mastoid effusion. Visible internal auditory structures appear normal. Intubated, study under anesthesia. Negative visible scalp and face. IMPRESSION: 1. Three small right hemisphere brain metastases metastases ranging from 2 mm to 4 mm. Minimal hemosiderin. No associated cerebral edema or mass effect. No other metastatic disease identified in the brain. 2. Known osseous metastatic disease but no destructive lesion identified in the skull or visible cervical spine. 3. Moderate for age cerebral white matter changes most commonly due to small vessel disease. Electronically Signed: By: Odessa Fleming M.D. On: 12/17/2022 13:16   MR LUMBAR SPINE W WO CONTRAST  Addendum Date: 12/17/2022   ADDENDUM REPORT: 12/17/2022 13:40 ADDENDUM: Study discussed by telephone with [name] COURTNEY WHARTON on 12/17/2022 at 1332 hours. And note that CLINICAL DATA should read 57 year old female with metastatic melanoma. Staging. Study under anesthesia. Electronically Signed   By: Odessa Fleming M.D.   On: 12/17/2022 13:40   Result Date: 12/17/2022 EXAM: MRI LUMBAR SPINE WITHOUT AND WITH CONTRAST TECHNIQUE: Multiplanar and multiecho pulse sequences of the lumbar spine were obtained without and with intravenous contrast.  CONTRAST:  5mL GADAVIST GADOBUTROL 1 MMOL/ML IV SOLN COMPARISON:  Brain MRI today reported  separately. PET-CT 12/09/2022. CTA chest abdomen pelvis 11/16/2022. FINDINGS: Segmentation:  Normal on the comparison CTA. Alignment:  Mild straightening of lumbar lordosis now. Vertebrae: Diffuse skeletal metastatic disease throughout the visible spine and pelvis. Scout view demonstrates thoracic vertebral metastases with extension into the ventral epidural space at both the T4 and T8 levels (series 10, image 14). Pathologic fracture of the L5 vertebral body has progressed since 11/16/2022. L5 loss of height is now up to 60%, and there is retropulsed and/or epidural extension of tumor at that level (series 16, image 31), see additional spinal canal details below. Lumbar ventral epidural tumor also at the left L1 vertebral level without spinal stenosis (series 15, image 17), and at the L4 level on the right (series 15, image 25) without spinal stenosis. Incidental bulky extraosseous tumor also from the right L1 transverse process, 2.2 cm right paraspinal muscle mass there on series 15, image 7. No other pathologic fracture identified. Conus medullaris and cauda equina: Conus extends to the L1 level. No lower spinal cord or conus signal abnormality. But there is an indistinct appearance of the surface of the lower thoracic spinal cord on T2 imaging (series 12, image 8), and indeterminate enhancement pattern at the conus (series 17, image 7). No obvious nerve root thickening or enhancement in the cauda equina. Paraspinal and other soft tissues: Right L1 paraspinal muscle tumor arising from the posterior elements at that level as above. Partially visible extensive liver tumor. No retroperitoneal, prevertebral lymphadenopathy. Disc levels: No significant lumbar spine degeneration superimposed on the diffuse metastatic disease. Ventral epidural tumor at L2 and L4 without spinal stenosis as above. Bulky tumor retropulsion, epidural extension at the L5 pathologic compression fracture level now resulting in severe spinal  stenosis (series 15, image 31 and series 12, image 8), severe lateral recess stenosis (S1 nerve levels) and severe proximal L5 foraminal stenosis. IMPRESSION: 1. Diffuse skeletal metastatic disease. Involvement throughout the visible spine and pelvis. 2. Progressed pathologic fracture of the L5 since 10/21/2024with 60% loss of height and bulky retropulsed/epidural tumor. 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 3. 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 And scout view demonstrates thoracic spine ventral epidural tumor at both T4 and T8 levels. 4. 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 Electronically Signed: By: Odessa Fleming M.D. On: 12/17/2022 13:28   NM PET Image Initial (PI) Whole Body  Result Date: 12/09/2022 CLINICAL DATA:  Initial treatment strategy for metastatic melanoma. EXAM: NUCLEAR MEDICINE PET WHOLE BODY TECHNIQUE: 6.6 mCi F-18 FDG was injected intravenously. Full-ring PET imaging was performed from the head to foot after the radiotracer. CT data was obtained and used for attenuation correction and anatomic localization. Fasting blood glucose: 115 mg/dl COMPARISON:  CTA chest abdomen pelvis dated 11/16/2022 FINDINGS: Mediastinal blood pool activity: SUV max 2.1 HEAD/NECK: No hypermetabolic activity in the scalp. No hypermetabolic cervical lymph nodes. Incidental CT findings: none CHEST: 14 mm posterior left upper lobe pulmonary nodule (series 7/image 20), max SUV 8.7. 11 mm short axis left AP window node (series 4/image 82), max SUV 11.2. Incidental CT findings: Mild atherosclerotic calcifications of the aortic arch. Mild coronary atherosclerosis of the LAD. Mild centrilobular and paraseptal emphysematous changes, upper lung predominant. ABDOMEN/PELVIS: Multifocal hepatic metastases, approximately 10-15 in number, including: --7.4 cm mass in the lateral segment left hepatic lobe (series 4/image 112), max  SUV 16.3 --2.9 cm mass in the medial aspect of segment 6 (series 4/image 122), max SUV 14.7 No abnormal hypermetabolism in the pancreas, spleen, or adrenal glands. No hypermetabolic abdominopelvic lymphadenopathy. Incidental CT findings: Status post cholecystectomy. Status post hysterectomy. Mild rectosigmoid colonic wall thickening (series 4/image 135), nonspecific. Atherosclerotic calcifications of the abdominal aorta and branch vessels. SKELETON: Multifocal/widespread osseous metastases, including: --Left humeral head, max SUV 6.4 --T4 vertebral body, max SUV 21.1 --Right lateral 6th rib, max SUV 4.2 --Right inferior sternum, max SUV 12.5 --L4 vertebral body, max SUV 16.3 --Left sacrum, max SUV 15.4 Hypermetabolism involving right anterior thigh musculature, favored to be physiologic. Incidental CT findings: none EXTREMITIES: No abnormal hypermetabolic activity in the lower extremities. Incidental CT findings: none IMPRESSION: Left upper lobe pulmonary metastasis, as above. Associated mediastinal nodal metastasis. Multifocal hepatic metastases, as above. Multifocal/widespread osseous metastases, as above. Electronically Signed   By: Charline Bills M.D.   On: 12/09/2022 23:34

## 2022-12-28 NOTE — Progress Notes (Addendum)
PROGRESS NOTE    JEANELL MASHNI  NWG:[id number] DOB: 1965/06/04 DOA: 12/27/2022 PCP: [name], [name]    Brief Narrative:   Laura [name] is a pleasant 57 y.o. female with medical history significant for metastatic malignant melanoma, cancer associated pain, and constipation to the hospital with confusion and somnolence for last 2 to 3 days with confusion.  Episodes seem to coincide with administration of her pain medications, but she became much more confused for 1 day prior to presentation with increased somnolence.  Also reported increasing fatigue with decreased appetite and not drinking much.  In the ED patient was afebrile.  Had hypoxia with pulse ox in the upper 80s on room air.  Was tachycardic..  Initial labs were notable for hypokalemia with potassium of 2.7.  Creatinine 1.3, alkaline phosphatase 334, AST 128, ALT 54,.  Negative respiratory viral panel and lactic acid.  KG showed sinus tachycardia.  Chest x-ray was negative for acute findings.  CT scan of the head showed possible new metastatic lesion in the right occipital lobe in addition to 2 stable right frontal cortex lesions.  CTA chest is negative for PE but notable for multiple new lung nodules and increased adenopathy.  New splenic hypodensities where noted on CT of the abdomen and pelvis.  Progressive lytic metastatic disease is also noted on CT.   Blood cultures were collected in the ED and the patient was treated with 2 L of LR, Zofran, fentanyl, oral and IV potassium, cefepime, vancomycin, and Flagyl.  Patient was then considered for admission to the hospital for further evaluation and treatment.  Assessment/Plan     Acute metabolic encephalopathy  Continued with increased fatigue somnolence and confusion.  Likely multifactorial from volume depletion, acute kidney injury, pain medications, brain mets and electrolyte imbalance.  Continue IV hydration correct electrolytes.  MS Contin dose has been decreased.  Will  continue laxatives, delirium precautions.  Ammonia level was 34.  Temperature max of 99.6 F, no leukocytosis.  Blood cultures negative in less than 12 hours.  Urinalysis with no white cells.  Lactate was 1.4.  COVID influenza and RSV was negative.  TSH and HIV pending..  This time patient is at her baseline mentation and is fully alert awake and oriented.  Acute hypoxic respiratory failure  - Saturating upper 80s on rm air in ED on presentation..  Chest x-ray negative including CT scan of the chest except for lung nodules and emphysema.  Continue incentive spirometry, supplemental oxygen.  Wean oxygen as able.  Currently on 2 L of oxygen.  Continue DuoNebs.  Metastatic melanoma  - Diagnosed in October 2024, found to have metastases to lung, liver, bone, brain.  Melanoma on the back status post resection.  Undergoing palliative radiation and treatment with nivolumab and ipilimumab under the care of Dr. Cherly Hensen oncology.  Communicated with Dr. Cherly Hensen medical oncology regarding the patient.    Hypokalemia  Was replaced in the ED for initial potassium of 2.7..  Potassium today at 3.1.  Will continue to replenish.  Mild hyponatremia.  Monitor closely.  Could be secondary to volume depletion  AKI  - SCr is 1.37 on admission, up from apparent baseline of ~1 or less.  Improved after IV fluids.  Creatinine today at 1.0.     DVT prophylaxis: enoxaparin (LOVENOX) injection 40 mg Start: 12/28/22 1000   Code Status:     Code Status: Full Code  Disposition: Likely home with home health in 1 to 2 days.  Status is:  Inpatient  Remains inpatient appropriate because: Electrolyte balance, dehydration, altered mental status, pending clinical improvement   Family Communication: Spoke with the patient's spouse at bedside.  Consultants:  None  Procedures:  None  Antimicrobials:  None  Anti-infectives (From admission, onward)    Start     Dose/Rate Route Frequency Ordered Stop   12/27/22 2245   ceFEPIme (MAXIPIME) 2 g in sodium chloride 0.9 % 100 mL IVPB        2 g 200 mL/hr over 30 Minutes Intravenous  Once 12/27/22 2238 12/28/22 0136   12/27/22 2245  metroNIDAZOLE (FLAGYL) IVPB 500 mg        500 mg 100 mL/hr over 60 Minutes Intravenous  Once 12/27/22 2238 12/28/22 0245   12/27/22 2245  vancomycin (VANCOCIN) IVPB 1000 mg/200 mL premix        1,000 mg 200 mL/hr over 60 Minutes Intravenous  Once 12/27/22 2238 12/28/22 0027        Subjective: Today, patient was seen and examined at bedside.  Possibly more alert awake and Communicative.  Denies any fever chills nausea vomiting or abdominal pain.  Has been undergoing immunotherapy and radiation treatment as outpatient.  Objective: Vitals:   12/28/22 0655 12/28/22 0736 12/28/22 0915 12/28/22 1123  BP:  101/62 99/64   Pulse:  (!) 112 (!) 114   Resp:  18 18   Temp: 99.6 F (37.6 C)   98.1 F (36.7 C)  TempSrc: Oral   Oral  SpO2:  97% 99%   Weight:        Intake/Output Summary (Last 24 hours) at 12/28/2022 1135 Last data filed at 12/28/2022 0708 Gross per 24 hour  Intake 3699.05 ml  Output --  Net 3699.05 ml   Filed Weights   12/27/22 2248  Weight: 63.5 kg    Physical Examination: Body mass index is 24.8 kg/m.  General:  Average built, not in obvious distress, alert awake and Communicative supplemental oxygen HENT:   No scleral pallor or icterus noted. Oral mucosa is moist.  Chest:    Diminished breath sounds bilaterally. No crackles or wheezes.  CVS: S1 &S2 heard. No murmur.  Regular rate and rhythm. Abdomen: Soft, nontender, nondistended.  Bowel sounds are heard.  Tenderness on the back. Extremities: No cyanosis, clubbing or edema.  Peripheral pulses are palpable. Psych: Alert, awake and oriented, normal mood CNS:  No cranial nerve deficits.  Extremities. Skin: Warm and dry.  No rashes noted.  Data Reviewed:   CBC: Recent Labs  Lab 12/27/22 2246 12/28/22 0729  WBC 6.3 5.4  NEUTROABS 4.7  --   HGB  11.8* 9.1*  HCT 35.1* 27.6*  MCV 92.9 94.8  PLT 305 227    Basic Metabolic Panel: Recent Labs  Lab 12/27/22 2246 12/28/22 0729  NA 132* 132*  K 2.7* 3.1*  CL 89* 95*  CO2 30 29  GLUCOSE 135* 122*  BUN 18 13  CREATININE 1.37* 1.04*  CALCIUM 10.6* 9.4  MG  --  2.2  PHOS  --  2.6    Liver Function Tests: Recent Labs  Lab 12/27/22 2246 12/28/22 0729  AST 128* 90*  ALT 54* 41  ALKPHOS 334* 234*  BILITOT 1.1 0.8  PROT 7.2 5.3*  ALBUMIN 3.0* 2.3*     Radiology Studies: CT Angio Chest PE W and/or Wo Contrast  Result Date: 12/28/2022 CLINICAL DATA:  Patient with metastatic melanoma, new transaminitis, pulmonary embolism suspected. EXAM: CT ANGIOGRAPHY CHEST CT ABDOMEN AND PELVIS WITH CONTRAST TECHNIQUE: Multidetector  CT imaging of the chest was performed using the standard protocol during bolus administration of intravenous contrast. Multiplanar CT image reconstructions and MIPs were obtained to evaluate the vascular anatomy. Multidetector CT imaging of the abdomen and pelvis was performed using the standard protocol during bolus administration of intravenous contrast. RADIATION DOSE REDUCTION: This exam was performed according to the departmental dose-optimization program which includes automated exposure control, adjustment of the mA and/or kV according to patient size and/or use of iterative reconstruction technique. CONTRAST:  OMNIPAQUE IOHEXOL 350 MG/ML SOLN COMPARISON:  PET-CT recently 12/09/2022, and CTA chest, abdomen and pelvis 11/16/2022. FINDINGS: CTA CHEST FINDINGS Cardiovascular: The cardiac size is normal. No arterial dilatation or embolism is seen. Minimal pericardial effusion anteriorly. Scattered calcific plaques lad coronary artery. Mild aortic atherosclerosis. There is no aneurysm, stenosis or dissection. The great vessels are widely patent. Pulmonary veins are nondistended. Mediastinum/Nodes: Enlarged superior left hilar lymph node is 1.3 cm in short axis,  previously 1.1 cm. No other intrathoracic adenopathy is seen. 1.2 cm hypodense nodule in the inferior pole of the left lobe of the thyroid gland is unchanged. No follow-up imaging is recommended. Lungs/Pleura: 1.5 cm pulmonary metastasis left upper lobe, level of the aortic arch, unchanged from the PET-CT, 11/16/2022 was 1.2 cm. There are several new nodules in the right middle lobe, most are 3 mm or smaller for example on series 13 axial images 80 6-99. Largest of these is 5 mm anteriorly on 13:90, concerning for new metastases. In the right lower lobe there is a new 5 mm nodule about the fissure on 13:90. Mild paraseptal emphysema again seen in the lung apices. There are coarse atelectatic bands in the posterior bases. On the left there is increased conspicuity of fissural nodules in the lower lung field, largest of these is about 3 mm. There is a new nodule in the left lower lobe measuring 5 mm on 13:99 and a new 3 mm apical left lung nodule on 13:44. There is no pleural effusion, no other new lung abnormality. No focal consolidation. Musculoskeletal: There is chronic interbody fusion hardware in the lower cervical spine. Multifocal lytic metastatic disease particularly in the thoracic spine but also in the ribs and sternum, with pathologic compression fracture of the T3 vertebral body, worsening lytic metastasis T4 body with extension into the spinal canal and increased erosion through the posterior cortex, with increased soft tissue infiltration into the left T3-4 foramen. Similar increased posterior cortical erosion and metastatic extension into the spinal canal at T8. Mass effect on the cord could be present at these levels. Review of the MIP images confirms the above findings. CT ABDOMEN and PELVIS FINDINGS Hepatobiliary: Innumerable diffuse metastases to the liver largest is in segment 2, today 7.5 cm, on the PET-CT 7.4 cm, the next largest is medially in segment 5 measuring 3.9 cm, previously 3.3 cm.  Gallbladder is absent without bile duct dilatation. Pancreas: No abnormality. Spleen: New scattered nonspecific subcentimeter hypodensities are noted in the inferior aspect of the spleen, possible metastases. Largest is 8 mm on 19:24. Adrenals/Urinary Tract: Stable 1 cm left adrenal nodule, was not hypermetabolic on PET-CT. No right adrenal or broad or renal mass. No urinary stone or obstruction. Unremarkable bladder for the degree of distention. Stomach/Bowel: Unremarkable contracted stomach. Normal caliber unopacified small bowel. Normal appendix. Moderate fecal stasis ascending and transverse colon. Distal descending colon and sigmoid either demonstrating mild wall thickening or underdistention. Unremarkable rectum. Vascular/Lymphatic: Aortic atherosclerosis. No enlarged abdominal or pelvic lymph nodes. Reproductive:  Status post hysterectomy. No adnexal masses. Multiple pelvic phleboliths. Other: No ascites or free air, no incarcerated hernia. Musculoskeletal: Progressive lytic metastatic disease, including compared with 12/09/2022, is noted at multiple levels of the lumbar spine, throughout the sacrum and bony pelvis, and with multiple small lytic lesions in both proximal femurs. There is increased pathologic compression fracture of the L5 vertebral body with soft tissue herniation into the spinal canal severely narrowing the spinal canal from the L4-5 to the L5-S1 level. There is an enhancing mass in the right dorsal paraspinous musculature at L2-3 measuring 2.3 cm and was seen on 12/09/2022. Destructive mass of S3 to the left today measures 3 cm, previously 2.6 cm. Review of the MIP images confirms the above findings. IMPRESSION: 1. No evidence of arterial dilatation or embolism. 2. 1.5 cm pulmonary metastasis left upper lobe, unchanged from the PET-CT, 1.2 cm on 11/16/2022. 3. Multiple new nodules in the right middle lobe, right lower lobe, and left apical lung, largest 5 mm, concerning for new metastases. 4.  Increased conspicuity of fissural nodules in the left lower lung field. 5. Increased size of a left hilar lymph node. 6. Innumerable diffuse metastases to the liver, largest 7.5 cm in segment 2, previously 7.4 cm. 7. New scattered subcentimeter hypodensities in the inferior spleen, possible metastases. 8. Progressive lytic metastatic disease throughout the spine, sacrum, bony pelvis, and proximal femurs. 9. Increased pathologic compression fracture of the T3 and L5 vertebral body with soft tissue herniation into the spinal canal severely narrowing the spinal canal from the L4-5 to the L5-S1 level, and in the thoracic spine with increased soft tissue infiltration of the spinal canal at T4 and T8, and in the left T3-4 foramen. 10. Stable 2.3 cm enhancing mass in the right dorsal paraspinous musculature at L2-3. 11. Constipation. 12. Distal descending colon and sigmoid either demonstrating mild wall thickening or underdistention. Correlate clinically for underlying colitis. 13. Aortic and coronary artery atherosclerosis. Aortic Atherosclerosis (ICD10-I70.0) and Emphysema (ICD10-J43.9). Electronically Signed   By: Almira Bar M.D.   On: 12/28/2022 02:39   CT ABDOMEN PELVIS W CONTRAST  Result Date: 12/28/2022 CLINICAL DATA:  Patient with metastatic melanoma, new transaminitis, pulmonary embolism suspected. EXAM: CT ANGIOGRAPHY CHEST CT ABDOMEN AND PELVIS WITH CONTRAST TECHNIQUE: Multidetector CT imaging of the chest was performed using the standard protocol during bolus administration of intravenous contrast. Multiplanar CT image reconstructions and MIPs were obtained to evaluate the vascular anatomy. Multidetector CT imaging of the abdomen and pelvis was performed using the standard protocol during bolus administration of intravenous contrast. RADIATION DOSE REDUCTION: This exam was performed according to the departmental dose-optimization program which includes automated exposure control, adjustment of the mA  and/or kV according to patient size and/or use of iterative reconstruction technique. CONTRAST:  OMNIPAQUE IOHEXOL 350 MG/ML SOLN COMPARISON:  PET-CT recently 12/09/2022, and CTA chest, abdomen and pelvis 11/16/2022. FINDINGS: CTA CHEST FINDINGS Cardiovascular: The cardiac size is normal. No arterial dilatation or embolism is seen. Minimal pericardial effusion anteriorly. Scattered calcific plaques lad coronary artery. Mild aortic atherosclerosis. There is no aneurysm, stenosis or dissection. The great vessels are widely patent. Pulmonary veins are nondistended. Mediastinum/Nodes: Enlarged superior left hilar lymph node is 1.3 cm in short axis, previously 1.1 cm. No other intrathoracic adenopathy is seen. 1.2 cm hypodense nodule in the inferior pole of the left lobe of the thyroid gland is unchanged. No follow-up imaging is recommended. Lungs/Pleura: 1.5 cm pulmonary metastasis left upper lobe, level of the aortic arch, unchanged  from the PET-CT, 11/16/2022 was 1.2 cm. There are several new nodules in the right middle lobe, most are 3 mm or smaller for example on series 13 axial images 80 6-99. Largest of these is 5 mm anteriorly on 13:90, concerning for new metastases. In the right lower lobe there is a new 5 mm nodule about the fissure on 13:90. Mild paraseptal emphysema again seen in the lung apices. There are coarse atelectatic bands in the posterior bases. On the left there is increased conspicuity of fissural nodules in the lower lung field, largest of these is about 3 mm. There is a new nodule in the left lower lobe measuring 5 mm on 13:99 and a new 3 mm apical left lung nodule on 13:44. There is no pleural effusion, no other new lung abnormality. No focal consolidation. Musculoskeletal: There is chronic interbody fusion hardware in the lower cervical spine. Multifocal lytic metastatic disease particularly in the thoracic spine but also in the ribs and sternum, with pathologic compression fracture of  the T3 vertebral body, worsening lytic metastasis T4 body with extension into the spinal canal and increased erosion through the posterior cortex, with increased soft tissue infiltration into the left T3-4 foramen. Similar increased posterior cortical erosion and metastatic extension into the spinal canal at T8. Mass effect on the cord could be present at these levels. Review of the MIP images confirms the above findings. CT ABDOMEN and PELVIS FINDINGS Hepatobiliary: Innumerable diffuse metastases to the liver largest is in segment 2, today 7.5 cm, on the PET-CT 7.4 cm, the next largest is medially in segment 5 measuring 3.9 cm, previously 3.3 cm. Gallbladder is absent without bile duct dilatation. Pancreas: No abnormality. Spleen: New scattered nonspecific subcentimeter hypodensities are noted in the inferior aspect of the spleen, possible metastases. Largest is 8 mm on 19:24. Adrenals/Urinary Tract: Stable 1 cm left adrenal nodule, was not hypermetabolic on PET-CT. No right adrenal or broad or renal mass. No urinary stone or obstruction. Unremarkable bladder for the degree of distention. Stomach/Bowel: Unremarkable contracted stomach. Normal caliber unopacified small bowel. Normal appendix. Moderate fecal stasis ascending and transverse colon. Distal descending colon and sigmoid either demonstrating mild wall thickening or underdistention. Unremarkable rectum. Vascular/Lymphatic: Aortic atherosclerosis. No enlarged abdominal or pelvic lymph nodes. Reproductive: Status post hysterectomy. No adnexal masses. Multiple pelvic phleboliths. Other: No ascites or free air, no incarcerated hernia. Musculoskeletal: Progressive lytic metastatic disease, including compared with 12/09/2022, is noted at multiple levels of the lumbar spine, throughout the sacrum and bony pelvis, and with multiple small lytic lesions in both proximal femurs. There is increased pathologic compression fracture of the L5 vertebral body with soft  tissue herniation into the spinal canal severely narrowing the spinal canal from the L4-5 to the L5-S1 level. There is an enhancing mass in the right dorsal paraspinous musculature at L2-3 measuring 2.3 cm and was seen on 12/09/2022. Destructive mass of S3 to the left today measures 3 cm, previously 2.6 cm. Review of the MIP images confirms the above findings. IMPRESSION: 1. No evidence of arterial dilatation or embolism. 2. 1.5 cm pulmonary metastasis left upper lobe, unchanged from the PET-CT, 1.2 cm on 11/16/2022. 3. Multiple new nodules in the right middle lobe, right lower lobe, and left apical lung, largest 5 mm, concerning for new metastases. 4. Increased conspicuity of fissural nodules in the left lower lung field. 5. Increased size of a left hilar lymph node. 6. Innumerable diffuse metastases to the liver, largest 7.5 cm in segment 2, previously  7.4 cm. 7. New scattered subcentimeter hypodensities in the inferior spleen, possible metastases. 8. Progressive lytic metastatic disease throughout the spine, sacrum, bony pelvis, and proximal femurs. 9. Increased pathologic compression fracture of the T3 and L5 vertebral body with soft tissue herniation into the spinal canal severely narrowing the spinal canal from the L4-5 to the L5-S1 level, and in the thoracic spine with increased soft tissue infiltration of the spinal canal at T4 and T8, and in the left T3-4 foramen. 10. Stable 2.3 cm enhancing mass in the right dorsal paraspinous musculature at L2-3. 11. Constipation. 12. Distal descending colon and sigmoid either demonstrating mild wall thickening or underdistention. Correlate clinically for underlying colitis. 13. Aortic and coronary artery atherosclerosis. Aortic Atherosclerosis (ICD10-I70.0) and Emphysema (ICD10-J43.9). Electronically Signed   By: Almira Bar M.D.   On: 12/28/2022 02:39   CT HEAD WO CONTRAST ( )  Result Date: 12/27/2022 CLINICAL DATA:  Delirium.  History of brain metastases.  EXAM: CT HEAD WITHOUT CONTRAST TECHNIQUE: Contiguous axial images were obtained from the base of the skull through the vertex without intravenous contrast. RADIATION DOSE REDUCTION: This exam was performed according to the departmental dose-optimization program which includes automated exposure control, adjustment of the mA and/or kV according to patient size and/or use of iterative reconstruction technique. COMPARISON:  Head CT 11/11/2022.  MRI brain 12/17/2022. FINDINGS: Brain: No evidence of acute infarction, hemorrhage, hydrocephalus, or extra-axial fluid collection. There are 2 rounded hyperdense metastatic lesions in the right frontal cortex seen best on coronal imaging. These are unchanged from prior MRI. There is also 2 mm hyperdense metastatic lesion in the right occipital region best seen on coronal image 5/46. This is not well appreciated on prior MRI. There is no mass effect. Minimal patchy periventricular white matter hypodensity appears similar to prior. Vascular: No hyperdense vessel or unexpected calcification. Skull: Normal. Negative for fracture or focal lesion. Sinuses/Orbits: No acute finding. Other: None. IMPRESSION: 1. No acute intracranial process. 2. Two stable rounded hyperdense metastatic lesions in the right frontal cortex. There is also a 2 mm hyperdense metastatic lesion in the right occipital region which is not well appreciated on prior MRI. No mass effect. Electronically Signed   By: Darliss Cheney M.D.   On: 12/27/2022 23:20   DG Chest Port 1 View  Result Date: 12/27/2022 CLINICAL DATA:  Questionable sepsis - evaluate for abnormality EXAM: PORTABLE CHEST 1 VIEW COMPARISON:  Chest x-ray 11/16/2022, CT angio chest 11/16/2022, PET CT 12/09/2022 FINDINGS: The heart and mediastinal contours are within normal limits. Aortic calcification. Redemonstration of an approximate 1.6 cm left upper quadrant pulmonary better evaluated on PET-CT 12/09/2022. No focal consolidation. No pulmonary  edema. No pleural effusion. No pneumothorax. No acute osseous abnormality.  Cervical spine surgical hardware. IMPRESSION: 1. No acute cardiopulmonary abnormality. 2. Redemonstration of an approximate 1.6 cm left upper quadrant pulmonary better evaluated on PET-CT 12/09/2022. 3.  Aortic Atherosclerosis (ICD10-I70.0). Electronically Signed   By: Tish Frederickson M.D.   On: 12/27/2022 23:05      LOS: 0 days    Joycelyn Das, MD [facility] Available via Epic secure chat 7am-7pm After these hours, please refer to coverage provider listed on [email or web address] 12/28/2022, 11:35 AM

## 2022-12-28 NOTE — ED Notes (Signed)
Pt has no diet order. Sent msg to attending Dr

## 2022-12-28 NOTE — H&P (Addendum)
History and Physical    Laura [name] QMV:[id number] DOB: 03-05-1965 DOA: 12/27/2022  PCP: [name], [name]   Patient coming from: Home   Chief Complaint: Confusion, somnolence   HPI: Laura [name] is a pleasant 57 y.o. female with medical history significant for metastatic malignant melanoma, cancer associated pain, and constipation who presents for evaluation of confusion and somnolence.  Patient is accompanied by her 2 sons and husband who assist with the history.  She has had waxing and waning confusion for a few days.  The episodes seem to coincide with administration of her pain medications, but she became much more confused yesterday.  She slept most of the day but was saying things that did not make sense whenever she was awake.    The patient reports feeling more fatigued than usual, acknowledges that she has lost her appetite and has not been eating or drinking much, but states that her pain has been well-controlled, she does not feel short of breath, and has not had fever or chills.  ED Course: Upon arrival to the ED, patient is found to be afebrile and saturating upper 80s on room air with normal RR, tachycardia, and stable blood pressure.  EKG demonstrates sinus tachycardia.  Chest x-ray is negative for acute findings.    Head CT is notable for possible new metastatic lesion in the right occipital lobe in addition to 2 stable right frontal cortex lesions.  CTA chest is negative for PE but notable for multiple new lung nodules and increased adenopathy.  New splenic hypodensities are noted on CT of the abdomen and pelvis.  Progressive lytic metastatic disease is also noted on CT.    Labs are most notable for potassium 2.7, creatinine 1.37, alkaline phosphatase 334, AST 128, ALT 54, normal bilirubin, normal WBC, negative respiratory virus panel, and normal lactic acid.  Blood cultures were collected in the ED and the patient was treated with 2 L of LR, Zofran, fentanyl,  oral and IV potassium, cefepime, vancomycin, and Flagyl.  Review of Systems:  All other systems reviewed and apart from HPI, are negative.  Past Medical History:  Diagnosis Date   Anxiety    Arthritis    Complication of anesthesia    Fibromyalgia    Gallstones 04/20/2018   GERD (gastroesophageal reflux disease)    History of blood transfusion    post Hysterectomy   Hypercholesteremia    Melanoma (HCC)    on back and excised   Migraine    PONV (postoperative nausea and vomiting)    Tobacco abuse    Vertebral artery disease (HCC)    history of left vertebral artery dissection 2011    Past Surgical History:  Procedure Laterality Date   ABDOMINAL HYSTERECTOMY     ANTERIOR FUSION CERVICAL SPINE  2008   BONE GRAFT HIP ILIAC CREST Left    Left Hip to Left Wrist   CHOLECYSTECTOMY N/A 04/20/2018   Procedure: LAPAROSCOPIC CHOLECYSTECTOMY;  Surgeon: Claud Kelp, MD;  Location: [facility] OR;  Service: General;  Laterality: N/A;   RADIOLOGY WITH ANESTHESIA N/A 12/17/2022   Procedure: MRI BRAIN WITH AND WITHOUT CONTRAST, MRI LUMBAR SPINE WITH AND WITHOUT CONTRAST WITH ANESTHESIA;  Surgeon: Radiologist, Medication, MD;  Location: [facility] OR;  Service: Radiology;  Laterality: N/A;   thumb surgery Left 2018   [facility]- Tendons Carpal tunnel   WRIST SURGERY  1994    Social History:   reports that she quit smoking about 6 years ago. Her smoking use included cigarettes.  She started smoking about 26 years ago. She has a 20 pack-year smoking history. She has never used smokeless tobacco. She reports that she does not drink alcohol and does not use drugs.  Allergies  Allergen Reactions   Aspirin Swelling    SWELLING REACTION UNSPECIFIED     Family History  Problem Relation Age of Onset   Lung cancer Father        smoking hx   Breast cancer Sister        diagnosed over the age of 62   Breast cancer Sister        diagnosed over the age of 72     Prior to Admission medications   Medication Sig  Start Date End Date Taking? Authorizing Provider  AMBIEN CR 6.25 MG CR tablet Take 6.25 mg by mouth at bedtime. 05/22/19   [provider]  CRESTOR 10 MG tablet Take 10 mg by mouth daily.    [provider]  cyclobenzaprine (FLEXERIL) 5 MG tablet Take 1 tablet (5 mg total) by mouth 3 (three) times daily as needed for muscle spasms. 12/14/22   Melven Sartorius, MD  dronabinol (MARINOL) 2.5 MG capsule Take 1 capsule (2.5 mg total) by mouth 2 (two) times daily before a meal. 12/16/22   Pickenpack-Cousar, Arty Baumgartner, NP  famotidine-calcium carbonate-magnesium hydroxide (PEPCID COMPLETE) 10-800-165 MG chewable tablet Chew 1 tablet by mouth daily as needed (acid reflux).    [provider]  HYDROmorphone (DILAUDID) 2 MG tablet Take 1 tablet (2 mg total) by mouth every 6 (six) hours as needed for severe pain (pain score 7-10). 12/16/22   Pickenpack-Cousar, Arty Baumgartner, NP  ibuprofen (ADVIL) 200 MG tablet Take 400 mg by mouth every 6 (six) hours as needed for moderate pain (pain score 4-6).    [provider]  lidocaine (LIDODERM) 5 % Place 1 patch onto the skin daily. 12/10/22   [provider]  LORazepam (ATIVAN) 0.5 MG tablet Take 1 tab 30 minutes before PET and 2 tabs before MRI 12/08/22   Melven Sartorius, MD  magnesium citrate SOLN Take 4 oz if no bowel movement in 4 hours take additional 4 oz. 12/16/22   Pickenpack-Cousar, Arty Baumgartner, NP  morphine (MS CONTIN) 30 MG 12 hr tablet Take 1 tablet (30 mg total) by mouth every 12 (twelve) hours. 12/16/22   Pickenpack-Cousar, Arty Baumgartner, NP  ondansetron (ZOFRAN) 8 MG tablet Take 1 tablet (8 mg total) by mouth every 8 (eight) hours as needed. 12/15/22   Melven Sartorius, MD  pantoprazole (PROTONIX) 40 MG tablet Take 1 tablet (40 mg total) by mouth daily. 12/16/22   Pickenpack-Cousar, Arty Baumgartner, NP  polyethylene glycol (MIRALAX / GLYCOLAX) 17 g packet Take 17 g by mouth 2 (two) times daily. 12/16/22   Pickenpack-Cousar, Arty Baumgartner, NP   pregabalin (LYRICA) 75 MG capsule Take 75 mg by mouth 3 (three) times daily. 08/21/19   [provider]  promethazine (PHENERGAN) 25 MG tablet Take 25 mg by mouth every 12 (twelve) hours as needed for nausea or vomiting.    [provider]  senna-docusate (SENOKOT-S) 8.6-50 MG tablet Take 1 tablet by mouth 2 (two) times daily.    [provider]  sucralfate (CARAFATE) 1 g tablet Take 1 tablet (1 g total) by mouth 4 (four) times daily -  with meals and at bedtime. 12/16/22   Glee Arvin, NP    Physical Exam: Vitals:   12/27/22 2345 12/28/22 0045 12/28/22 0100 12/28/22  0230  BP: 124/66 115/67 101/80 105/69  Pulse: (!) 128 (!) 128 (!) 127 (!) 123  Resp: 19 14 19 19   Temp:    98 F (36.7 C)  TempSrc:    Oral  SpO2: 91% 99% 97% 96%  Weight:        Constitutional: NAD, calm  Eyes: PERTLA, lids and conjunctivae normal ENMT: Mucous membranes are moist. Posterior pharynx clear of any exudate or lesions.   Neck: supple, no masses  Respiratory: no wheezing, no crackles. No accessory muscle use.  Cardiovascular: Rate ~120 and regular. No extremity edema.  Abdomen: No distension, no tenderness, soft. Bowel sounds active.  Musculoskeletal: no clubbing / cyanosis. No joint deformity upper and lower extremities.   Skin: no significant rashes, lesions, ulcers. Warm, dry, well-perfused. Neurologic: CN 2-12 grossly intact. Sensation to light touch intact. Moving all extremities. Alert and oriented to person, place, and situation.  Psychiatric: Pleasant. Cooperative.    Labs and Imaging on Admission: I have personally reviewed following labs and imaging studies  CBC: Recent Labs  Lab 12/27/22 2246  WBC 6.3  NEUTROABS 4.7  HGB 11.8*  HCT 35.1*  MCV 92.9  PLT 305   Basic Metabolic Panel: Recent Labs  Lab 12/27/22 2246  NA 132*  K 2.7*  CL 89*  CO2 30  GLUCOSE 135*  BUN 18  CREATININE 1.37*  CALCIUM 10.6*   GFR: Estimated Creatinine  Clearance: 42.1 mL/min (A) (by C-G formula based on SCr of 1.37 mg/dL (H)). Liver Function Tests: Recent Labs  Lab 12/27/22 2246  AST 128*  ALT 54*  ALKPHOS 334*  BILITOT 1.1  PROT 7.2  ALBUMIN 3.0*   No results for input(s): "LIPASE", "AMYLASE" in the last 168 hours. No results for input(s): "AMMONIA" in the last 168 hours. Coagulation Profile: Recent Labs  Lab 12/27/22 2246  INR 1.3*   Cardiac Enzymes: No results for input(s): "CKTOTAL", "CKMB", "CKMBINDEX", "TROPONINI" in the last 168 hours. BNP (last 3 results) No results for input(s): "PROBNP" in the last 8760 hours. HbA1C: No results for input(s): "HGBA1C" in the last 72 hours. CBG: Recent Labs  Lab 12/27/22 2309  GLUCAP 118*   Lipid Profile: No results for input(s): "CHOL", "HDL", "LDLCALC", "TRIG", "CHOLHDL", "LDLDIRECT" in the last 72 hours. Thyroid Function Tests: No results for input(s): "TSH", "T4TOTAL", "FREET4", "T3FREE", "THYROIDAB" in the last 72 hours. Anemia Panel: No results for input(s): "VITAMINB12", "FOLATE", "FERRITIN", "TIBC", "IRON", "RETICCTPCT" in the last 72 hours. Urine analysis:    Component Value Date/Time   COLORURINE COLORLESS (A) 03/25/2018 1327   APPEARANCEUR CLEAR 03/25/2018 1327   LABSPEC 1.002 (L) 03/25/2018 1327   PHURINE 7.0 03/25/2018 1327   GLUCOSEU NEGATIVE 03/25/2018 1327   HGBUR NEGATIVE 03/25/2018 1327   HGBUR negative 01/07/2010 1438   BILIRUBINUR NEGATIVE 03/25/2018 1327   KETONESUR NEGATIVE 03/25/2018 1327   PROTEINUR NEGATIVE 03/25/2018 1327   UROBILINOGEN 0.2 05/01/2014 1000   NITRITE NEGATIVE 03/25/2018 1327   LEUKOCYTESUR NEGATIVE 03/25/2018 1327   Sepsis Labs: @LABRCNTIP (procalcitonin:4,lacticidven:4) ) Recent Results (from the past 240 hour(s))  Resp panel by RT-PCR (RSV, Flu A&B, Covid) Anterior Nasal Swab     Status: None   Collection Time: 12/27/22 12:30 AM   Specimen: Anterior Nasal Swab  Result Value Ref Range Status   SARS Coronavirus 2 by RT  PCR NEGATIVE NEGATIVE Final    Comment: (NOTE) SARS-CoV-2 target nucleic acids are NOT DETECTED.  The SARS-CoV-2 RNA is generally detectable in upper respiratory specimens during the acute  phase of infection. The lowest concentration of SARS-CoV-2 viral copies this assay can detect is 138 copies/mL. A negative result does not preclude SARS-Cov-2 infection and should not be used as the sole basis for treatment or other patient management decisions. A negative result may occur with  improper specimen collection/handling, submission of specimen other than nasopharyngeal swab, presence of viral mutation(s) within the areas targeted by this assay, and inadequate number of viral copies(<138 copies/mL). A negative result must be combined with clinical observations, patient history, and epidemiological information. The expected result is Negative.  Fact Sheet for Patients:  BloggerCourse.com  Fact Sheet for Healthcare Providers:  SeriousBroker.it  This test is no t yet approved or cleared by the [place] FDA and  has been authorized for detection and/or diagnosis of SARS-CoV-2 by FDA under an Emergency Use Authorization (EUA). This EUA will remain  in effect (meaning this test can be used) for the duration of the COVID-19 declaration under Section 564(b)(1) of the Act, 21 U.S.C.section 360bbb-3(b)(1), unless the authorization is terminated  or revoked sooner.       Influenza A by PCR NEGATIVE NEGATIVE Final   Influenza B by PCR NEGATIVE NEGATIVE Final    Comment: (NOTE) The Xpert Xpress SARS-CoV-2/FLU/RSV plus assay is intended as an aid in the diagnosis of influenza from Nasopharyngeal swab specimens and should not be used as a sole basis for treatment. Nasal washings and aspirates are unacceptable for Xpert Xpress SARS-CoV-2/FLU/RSV testing.  Fact Sheet for Patients: BloggerCourse.com  Fact Sheet for  Healthcare Providers: SeriousBroker.it  This test is not yet approved or cleared by the [place] FDA and has been authorized for detection and/or diagnosis of SARS-CoV-2 by FDA under an Emergency Use Authorization (EUA). This EUA will remain in effect (meaning this test can be used) for the duration of the COVID-19 declaration under Section 564(b)(1) of the Act, 21 U.S.C. section 360bbb-3(b)(1), unless the authorization is terminated or revoked.     Resp Syncytial Virus by PCR NEGATIVE NEGATIVE Final    Comment: (NOTE) Fact Sheet for Patients: BloggerCourse.com  Fact Sheet for Healthcare Providers: SeriousBroker.it  This test is not yet approved or cleared by the [place] FDA and has been authorized for detection and/or diagnosis of SARS-CoV-2 by FDA under an Emergency Use Authorization (EUA). This EUA will remain in effect (meaning this test can be used) for the duration of the COVID-19 declaration under Section 564(b)(1) of the Act, 21 U.S.C. section 360bbb-3(b)(1), unless the authorization is terminated or revoked.  Performed at [facility], [address], [place], [place] [address]   Blood Culture (routine x 2)     Status: None (Preliminary result)   Collection Time: 12/27/22 10:46 PM   Specimen: BLOOD  Result Value Ref Range Status   Specimen Description BLOOD SITE NOT SPECIFIED  Final   Special Requests   Final    BOTTLES DRAWN AEROBIC AND ANAEROBIC Blood Culture adequate volume Performed at [facility], [address], [place], [place] [address]    Culture PENDING  Incomplete   Report Status PENDING  Incomplete  Blood Culture (routine x 2)     Status: None (Preliminary result)   Collection Time: 12/28/22 12:07 AM   Specimen: BLOOD RIGHT ARM  Result Value Ref Range Status   Specimen Description BLOOD RIGHT ARM  Final   Special Requests   Final     BOTTLES DRAWN AEROBIC AND ANAEROBIC Blood Culture results may not be optimal due to an  inadequate volume of blood received in culture bottles Performed at [facility], [address], [place], [place] [address]    Culture PENDING  Incomplete   Report Status PENDING  Incomplete     Radiological Exams on Admission: CT Angio Chest PE W and/or Wo Contrast  Result Date: 12/28/2022 CLINICAL DATA:  Patient with metastatic melanoma, new transaminitis, pulmonary embolism suspected. EXAM: CT ANGIOGRAPHY CHEST CT ABDOMEN AND PELVIS WITH CONTRAST TECHNIQUE: Multidetector CT imaging of the chest was performed using the standard protocol during bolus administration of intravenous contrast. Multiplanar CT image reconstructions and MIPs were obtained to evaluate the vascular anatomy. Multidetector CT imaging of the abdomen and pelvis was performed using the standard protocol during bolus administration of intravenous contrast. RADIATION DOSE REDUCTION: This exam was performed according to the departmental dose-optimization program which includes automated exposure control, adjustment of the mA and/or kV according to patient size and/or use of iterative reconstruction technique. CONTRAST:  OMNIPAQUE IOHEXOL 350 MG/ML SOLN COMPARISON:  PET-CT recently 12/09/2022, and CTA chest, abdomen and pelvis 11/16/2022. FINDINGS: CTA CHEST FINDINGS Cardiovascular: The cardiac size is normal. No arterial dilatation or embolism is seen. Minimal pericardial effusion anteriorly. Scattered calcific plaques lad coronary artery. Mild aortic atherosclerosis. There is no aneurysm, stenosis or dissection. The great vessels are widely patent. Pulmonary veins are nondistended. Mediastinum/Nodes: Enlarged superior left hilar lymph node is 1.3 cm in short axis, previously 1.1 cm. No other intrathoracic adenopathy is seen. 1.2 cm hypodense nodule in the inferior pole of the left lobe of the thyroid gland is unchanged. No follow-up  imaging is recommended. Lungs/Pleura: 1.5 cm pulmonary metastasis left upper lobe, level of the aortic arch, unchanged from the PET-CT, 11/16/2022 was 1.2 cm. There are several new nodules in the right middle lobe, most are 3 mm or smaller for example on series 13 axial images 80 6-99. Largest of these is 5 mm anteriorly on 13:90, concerning for new metastases. In the right lower lobe there is a new 5 mm nodule about the fissure on 13:90. Mild paraseptal emphysema again seen in the lung apices. There are coarse atelectatic bands in the posterior bases. On the left there is increased conspicuity of fissural nodules in the lower lung field, largest of these is about 3 mm. There is a new nodule in the left lower lobe measuring 5 mm on 13:99 and a new 3 mm apical left lung nodule on 13:44. There is no pleural effusion, no other new lung abnormality. No focal consolidation. Musculoskeletal: There is chronic interbody fusion hardware in the lower cervical spine. Multifocal lytic metastatic disease particularly in the thoracic spine but also in the ribs and sternum, with pathologic compression fracture of the T3 vertebral body, worsening lytic metastasis T4 body with extension into the spinal canal and increased erosion through the posterior cortex, with increased soft tissue infiltration into the left T3-4 foramen. Similar increased posterior cortical erosion and metastatic extension into the spinal canal at T8. Mass effect on the cord could be present at these levels. Review of the MIP images confirms the above findings. CT ABDOMEN and PELVIS FINDINGS Hepatobiliary: Innumerable diffuse metastases to the liver largest is in segment 2, today 7.5 cm, on the PET-CT 7.4 cm, the next largest is medially in segment 5 measuring 3.9 cm, previously 3.3 cm. Gallbladder is absent without bile duct dilatation. Pancreas: No abnormality. Spleen: New scattered nonspecific subcentimeter hypodensities are noted in the inferior aspect of  the spleen, possible metastases. Largest is 8  mm on 19:24. Adrenals/Urinary Tract: Stable 1 cm left adrenal nodule, was not hypermetabolic on PET-CT. No right adrenal or broad or renal mass. No urinary stone or obstruction. Unremarkable bladder for the degree of distention. Stomach/Bowel: Unremarkable contracted stomach. Normal caliber unopacified small bowel. Normal appendix. Moderate fecal stasis ascending and transverse colon. Distal descending colon and sigmoid either demonstrating mild wall thickening or underdistention. Unremarkable rectum. Vascular/Lymphatic: Aortic atherosclerosis. No enlarged abdominal or pelvic lymph nodes. Reproductive: Status post hysterectomy. No adnexal masses. Multiple pelvic phleboliths. Other: No ascites or free air, no incarcerated hernia. Musculoskeletal: Progressive lytic metastatic disease, including compared with 12/09/2022, is noted at multiple levels of the lumbar spine, throughout the sacrum and bony pelvis, and with multiple small lytic lesions in both proximal femurs. There is increased pathologic compression fracture of the L5 vertebral body with soft tissue herniation into the spinal canal severely narrowing the spinal canal from the L4-5 to the L5-S1 level. There is an enhancing mass in the right dorsal paraspinous musculature at L2-3 measuring 2.3 cm and was seen on 12/09/2022. Destructive mass of S3 to the left today measures 3 cm, previously 2.6 cm. Review of the MIP images confirms the above findings. IMPRESSION: 1. No evidence of arterial dilatation or embolism. 2. 1.5 cm pulmonary metastasis left upper lobe, unchanged from the PET-CT, 1.2 cm on 11/16/2022. 3. Multiple new nodules in the right middle lobe, right lower lobe, and left apical lung, largest 5 mm, concerning for new metastases. 4. Increased conspicuity of fissural nodules in the left lower lung field. 5. Increased size of a left hilar lymph node. 6. Innumerable diffuse metastases to the liver, largest  7.5 cm in segment 2, previously 7.4 cm. 7. New scattered subcentimeter hypodensities in the inferior spleen, possible metastases. 8. Progressive lytic metastatic disease throughout the spine, sacrum, bony pelvis, and proximal femurs. 9. Increased pathologic compression fracture of the T3 and L5 vertebral body with soft tissue herniation into the spinal canal severely narrowing the spinal canal from the L4-5 to the L5-S1 level, and in the thoracic spine with increased soft tissue infiltration of the spinal canal at T4 and T8, and in the left T3-4 foramen. 10. Stable 2.3 cm enhancing mass in the right dorsal paraspinous musculature at L2-3. 11. Constipation. 12. Distal descending colon and sigmoid either demonstrating mild wall thickening or underdistention. Correlate clinically for underlying colitis. 13. Aortic and coronary artery atherosclerosis. Aortic Atherosclerosis (ICD10-I70.0) and Emphysema (ICD10-J43.9). Electronically Signed   By: Almira Bar M.D.   On: 12/28/2022 02:39   CT ABDOMEN PELVIS W CONTRAST  Result Date: 12/28/2022 CLINICAL DATA:  Patient with metastatic melanoma, new transaminitis, pulmonary embolism suspected. EXAM: CT ANGIOGRAPHY CHEST CT ABDOMEN AND PELVIS WITH CONTRAST TECHNIQUE: Multidetector CT imaging of the chest was performed using the standard protocol during bolus administration of intravenous contrast. Multiplanar CT image reconstructions and MIPs were obtained to evaluate the vascular anatomy. Multidetector CT imaging of the abdomen and pelvis was performed using the standard protocol during bolus administration of intravenous contrast. RADIATION DOSE REDUCTION: This exam was performed according to the departmental dose-optimization program which includes automated exposure control, adjustment of the mA and/or kV according to patient size and/or use of iterative reconstruction technique. CONTRAST:  OMNIPAQUE IOHEXOL 350 MG/ML SOLN COMPARISON:  PET-CT recently 12/09/2022,  and CTA chest, abdomen and pelvis 11/16/2022. FINDINGS: CTA CHEST FINDINGS Cardiovascular: The cardiac size is normal. No arterial dilatation or embolism is seen. Minimal pericardial effusion anteriorly. Scattered calcific plaques lad coronary artery.  Mild aortic atherosclerosis. There is no aneurysm, stenosis or dissection. The great vessels are widely patent. Pulmonary veins are nondistended. Mediastinum/Nodes: Enlarged superior left hilar lymph node is 1.3 cm in short axis, previously 1.1 cm. No other intrathoracic adenopathy is seen. 1.2 cm hypodense nodule in the inferior pole of the left lobe of the thyroid gland is unchanged. No follow-up imaging is recommended. Lungs/Pleura: 1.5 cm pulmonary metastasis left upper lobe, level of the aortic arch, unchanged from the PET-CT, 11/16/2022 was 1.2 cm. There are several new nodules in the right middle lobe, most are 3 mm or smaller for example on series 13 axial images 80 6-99. Largest of these is 5 mm anteriorly on 13:90, concerning for new metastases. In the right lower lobe there is a new 5 mm nodule about the fissure on 13:90. Mild paraseptal emphysema again seen in the lung apices. There are coarse atelectatic bands in the posterior bases. On the left there is increased conspicuity of fissural nodules in the lower lung field, largest of these is about 3 mm. There is a new nodule in the left lower lobe measuring 5 mm on 13:99 and a new 3 mm apical left lung nodule on 13:44. There is no pleural effusion, no other new lung abnormality. No focal consolidation. Musculoskeletal: There is chronic interbody fusion hardware in the lower cervical spine. Multifocal lytic metastatic disease particularly in the thoracic spine but also in the ribs and sternum, with pathologic compression fracture of the T3 vertebral body, worsening lytic metastasis T4 body with extension into the spinal canal and increased erosion through the posterior cortex, with increased soft tissue  infiltration into the left T3-4 foramen. Similar increased posterior cortical erosion and metastatic extension into the spinal canal at T8. Mass effect on the cord could be present at these levels. Review of the MIP images confirms the above findings. CT ABDOMEN and PELVIS FINDINGS Hepatobiliary: Innumerable diffuse metastases to the liver largest is in segment 2, today 7.5 cm, on the PET-CT 7.4 cm, the next largest is medially in segment 5 measuring 3.9 cm, previously 3.3 cm. Gallbladder is absent without bile duct dilatation. Pancreas: No abnormality. Spleen: New scattered nonspecific subcentimeter hypodensities are noted in the inferior aspect of the spleen, possible metastases. Largest is 8 mm on 19:24. Adrenals/Urinary Tract: Stable 1 cm left adrenal nodule, was not hypermetabolic on PET-CT. No right adrenal or broad or renal mass. No urinary stone or obstruction. Unremarkable bladder for the degree of distention. Stomach/Bowel: Unremarkable contracted stomach. Normal caliber unopacified small bowel. Normal appendix. Moderate fecal stasis ascending and transverse colon. Distal descending colon and sigmoid either demonstrating mild wall thickening or underdistention. Unremarkable rectum. Vascular/Lymphatic: Aortic atherosclerosis. No enlarged abdominal or pelvic lymph nodes. Reproductive: Status post hysterectomy. No adnexal masses. Multiple pelvic phleboliths. Other: No ascites or free air, no incarcerated hernia. Musculoskeletal: Progressive lytic metastatic disease, including compared with 12/09/2022, is noted at multiple levels of the lumbar spine, throughout the sacrum and bony pelvis, and with multiple small lytic lesions in both proximal femurs. There is increased pathologic compression fracture of the L5 vertebral body with soft tissue herniation into the spinal canal severely narrowing the spinal canal from the L4-5 to the L5-S1 level. There is an enhancing mass in the right dorsal paraspinous  musculature at L2-3 measuring 2.3 cm and was seen on 12/09/2022. Destructive mass of S3 to the left today measures 3 cm, previously 2.6 cm. Review of the MIP images confirms the above findings. IMPRESSION: 1. No evidence  of arterial dilatation or embolism. 2. 1.5 cm pulmonary metastasis left upper lobe, unchanged from the PET-CT, 1.2 cm on 11/16/2022. 3. Multiple new nodules in the right middle lobe, right lower lobe, and left apical lung, largest 5 mm, concerning for new metastases. 4. Increased conspicuity of fissural nodules in the left lower lung field. 5. Increased size of a left hilar lymph node. 6. Innumerable diffuse metastases to the liver, largest 7.5 cm in segment 2, previously 7.4 cm. 7. New scattered subcentimeter hypodensities in the inferior spleen, possible metastases. 8. Progressive lytic metastatic disease throughout the spine, sacrum, bony pelvis, and proximal femurs. 9. Increased pathologic compression fracture of the T3 and L5 vertebral body with soft tissue herniation into the spinal canal severely narrowing the spinal canal from the L4-5 to the L5-S1 level, and in the thoracic spine with increased soft tissue infiltration of the spinal canal at T4 and T8, and in the left T3-4 foramen. 10. Stable 2.3 cm enhancing mass in the right dorsal paraspinous musculature at L2-3. 11. Constipation. 12. Distal descending colon and sigmoid either demonstrating mild wall thickening or underdistention. Correlate clinically for underlying colitis. 13. Aortic and coronary artery atherosclerosis. Aortic Atherosclerosis (ICD10-I70.0) and Emphysema (ICD10-J43.9). Electronically Signed   By: Almira Bar M.D.   On: 12/28/2022 02:39   CT HEAD WO CONTRAST ( )  Result Date: 12/27/2022 CLINICAL DATA:  Delirium.  History of brain metastases. EXAM: CT HEAD WITHOUT CONTRAST TECHNIQUE: Contiguous axial images were obtained from the base of the skull through the vertex without intravenous contrast. RADIATION DOSE  REDUCTION: This exam was performed according to the departmental dose-optimization program which includes automated exposure control, adjustment of the mA and/or kV according to patient size and/or use of iterative reconstruction technique. COMPARISON:  Head CT 11/11/2022.  MRI brain 12/17/2022. FINDINGS: Brain: No evidence of acute infarction, hemorrhage, hydrocephalus, or extra-axial fluid collection. There are 2 rounded hyperdense metastatic lesions in the right frontal cortex seen best on coronal imaging. These are unchanged from prior MRI. There is also 2 mm hyperdense metastatic lesion in the right occipital region best seen on coronal image 5/46. This is not well appreciated on prior MRI. There is no mass effect. Minimal patchy periventricular white matter hypodensity appears similar to prior. Vascular: No hyperdense vessel or unexpected calcification. Skull: Normal. Negative for fracture or focal lesion. Sinuses/Orbits: No acute finding. Other: None. IMPRESSION: 1. No acute intracranial process. 2. Two stable rounded hyperdense metastatic lesions in the right frontal cortex. There is also a 2 mm hyperdense metastatic lesion in the right occipital region which is not well appreciated on prior MRI. No mass effect. Electronically Signed   By: Darliss Cheney M.D.   On: 12/27/2022 23:20   DG Chest Port 1 View  Result Date: 12/27/2022 CLINICAL DATA:  Questionable sepsis - evaluate for abnormality EXAM: PORTABLE CHEST 1 VIEW COMPARISON:  Chest x-ray 11/16/2022, CT angio chest 11/16/2022, PET CT 12/09/2022 FINDINGS: The heart and mediastinal contours are within normal limits. Aortic calcification. Redemonstration of an approximate 1.6 cm left upper quadrant pulmonary better evaluated on PET-CT 12/09/2022. No focal consolidation. No pulmonary edema. No pleural effusion. No pneumothorax. No acute osseous abnormality.  Cervical spine surgical hardware. IMPRESSION: 1. No acute cardiopulmonary abnormality. 2.  Redemonstration of an approximate 1.6 cm left upper quadrant pulmonary better evaluated on PET-CT 12/09/2022. 3.  Aortic Atherosclerosis (ICD10-I70.0). Electronically Signed   By: Tish Frederickson M.D.   On: 12/27/2022 23:05    EKG: Independently reviewed. Sinus tachycardia, rate  136.   Assessment/Plan   1. Acute encephalopathy  - Multiple potential etiologies include dehydration, constipation, AKI, electrolyte derangements, pain medications with recently decreased GFR, brain mets  - Plan to continue IVF hydration, correct electrolytes, lower MS Contin dose until GFR improves, treat constipation, check ammonia, magnesium, and phosphorus, use delirium precautions    2. Acute hypoxic respiratory failure  - Saturating upper 80s on rm air in ED  - She does not feel SOB and RR has been normal  - CTA chest negative for PE but notable for new lung nodules, mild emphysematous changes, and atelectasis  - Continue supplemental O2 as needed, start incentive spirometry     3. Metastatic melanoma  - Diagnosed in October 2024, found to have metastases to lung, liver, bone, brain  - Undergoing palliative radiation and treatment with nivolumab and ipilimumab under the care of Dr. Cherly Hensen    4. Hypokalemia  - Replaced in ED, will repeat chem panel    5. AKI  - SCr is 1.37 on admission, up from apparent baseline of ~1 or less  - Likely prerenal in setting of loss of appetite, no obstruction seen on CT in ED  - Continue IVF hydration, renally-dose medications, repeat chem panel    DVT prophylaxis: Lovenox  Code Status: Full  Level of Care: Level of care: Progressive Family Communication: Two sons and husband at bedside  Disposition Plan:  Patient is from: home  Anticipated d/c is to: TBD Anticipated d/c date is: 12/31/22 Patient currently: Pending improved mental status and renal function, electrolyte replacement Consults called: None  Admission status: Inpatient     Briscoe Deutscher, MD [facility]  12/28/2022, 3:42 AM

## 2022-12-28 NOTE — ED Notes (Addendum)
Patient went to radiation so was unable to get her vital signs.

## 2022-12-29 ENCOUNTER — Other Ambulatory Visit: Payer: Self-pay | Admitting: Nurse Practitioner

## 2022-12-29 ENCOUNTER — Telehealth: Payer: Self-pay

## 2022-12-29 ENCOUNTER — Other Ambulatory Visit (HOSPITAL_COMMUNITY): Payer: Self-pay

## 2022-12-29 ENCOUNTER — Other Ambulatory Visit: Payer: Self-pay

## 2022-12-29 ENCOUNTER — Ambulatory Visit: Payer: BC Managed Care – PPO

## 2022-12-29 DIAGNOSIS — C439 Malignant melanoma of skin, unspecified: Secondary | ICD-10-CM | POA: Diagnosis not present

## 2022-12-29 DIAGNOSIS — N179 Acute kidney failure, unspecified: Secondary | ICD-10-CM | POA: Diagnosis not present

## 2022-12-29 DIAGNOSIS — C7931 Secondary malignant neoplasm of brain: Secondary | ICD-10-CM | POA: Diagnosis not present

## 2022-12-29 DIAGNOSIS — G893 Neoplasm related pain (acute) (chronic): Secondary | ICD-10-CM

## 2022-12-29 DIAGNOSIS — J9601 Acute respiratory failure with hypoxia: Secondary | ICD-10-CM | POA: Diagnosis not present

## 2022-12-29 DIAGNOSIS — G9340 Encephalopathy, unspecified: Secondary | ICD-10-CM | POA: Diagnosis not present

## 2022-12-29 DIAGNOSIS — K5903 Drug induced constipation: Secondary | ICD-10-CM | POA: Diagnosis not present

## 2022-12-29 LAB — COMPREHENSIVE METABOLIC PANEL
ALT: 35 U/L (ref 0–44)
AST: 77 U/L — ABNORMAL HIGH (ref 15–41)
Albumin: 2.1 g/dL — ABNORMAL LOW (ref 3.5–5.0)
Alkaline Phosphatase: 228 U/L — ABNORMAL HIGH (ref 38–126)
Anion gap: 4 — ABNORMAL LOW (ref 5–15)
BUN: 10 mg/dL (ref 6–20)
CO2: 30 mmol/L (ref 22–32)
Calcium: 8.7 mg/dL — ABNORMAL LOW (ref 8.9–10.3)
Chloride: 99 mmol/L (ref 98–111)
Creatinine, Ser: 0.98 mg/dL (ref 0.44–1.00)
GFR, Estimated: >60 mL/min (ref >60)
Glucose, Bld: 100 mg/dL — ABNORMAL HIGH (ref 70–99)
Potassium: 2.7 mmol/L — CL (ref 3.5–5.1)
Sodium: 133 mmol/L — ABNORMAL LOW (ref 135–145)
Total Bilirubin: 1 mg/dL (ref <1.2)
Total Protein: 5 g/dL — ABNORMAL LOW (ref 6.5–8.1)

## 2022-12-29 LAB — CBC
HCT: 26.3 % — ABNORMAL LOW (ref 36.0–46.0)
Hemoglobin: 9 g/dL — ABNORMAL LOW (ref 12.0–15.0)
MCH: 32 pg (ref 26.0–34.0)
MCHC: 34.2 g/dL (ref 30.0–36.0)
MCV: 93.6 fL (ref 80.0–100.0)
Platelets: 223 10*3/uL (ref 150–400)
RBC: 2.81 MIL/uL — ABNORMAL LOW (ref 3.87–5.11)
RDW: 13.2 % (ref 11.5–15.5)
WBC: 5.4 10*3/uL (ref 4.0–10.5)
nRBC: 0.6 % — ABNORMAL HIGH (ref 0.0–0.2)

## 2022-12-29 LAB — MAGNESIUM: Magnesium: 2 mg/dL (ref 1.7–2.4)

## 2022-12-29 LAB — PHOSPHORUS: Phosphorus: 3.1 mg/dL (ref 2.5–4.6)

## 2022-12-29 MED ORDER — POTASSIUM CHLORIDE 10 MEQ/100ML IV SOLN
INTRAVENOUS | Status: AC
  Administered 2022-12-29: 10 meq
  Filled 2022-12-29: qty 100

## 2022-12-29 MED ORDER — POTASSIUM CHLORIDE CRYS ER 20 MEQ PO TBCR: 20.0000 meq | EXTENDED_RELEASE_TABLET | Freq: Two times a day (BID) | ORAL | 0 refills | Status: DC

## 2022-12-29 MED ORDER — MORPHINE SULFATE ER 15 MG PO TBCR: 15.0000 mg | EXTENDED_RELEASE_TABLET | Freq: Two times a day (BID) | ORAL | Status: DC

## 2022-12-29 MED ORDER — MORPHINE SULFATE ER 15 MG PO TBCR
15.0000 mg | EXTENDED_RELEASE_TABLET | Freq: Two times a day (BID) | ORAL | 0 refills | Status: DC
  Filled 2022-12-29: qty 30, 15d supply, fill #0

## 2022-12-29 MED ORDER — BISACODYL 5 MG PO TBEC
10.0000 mg | DELAYED_RELEASE_TABLET | Freq: Once | ORAL | Status: AC
  Administered 2022-12-29: 10 mg via ORAL
  Filled 2022-12-29: qty 2

## 2022-12-29 MED ORDER — POTASSIUM CHLORIDE CRYS ER 20 MEQ PO TBCR
20.0000 meq | EXTENDED_RELEASE_TABLET | Freq: Two times a day (BID) | ORAL | 0 refills | Status: DC
  Filled 2022-12-29: qty 10, 5d supply, fill #0

## 2022-12-29 MED ORDER — POTASSIUM CHLORIDE 10 MEQ/100ML IV SOLN
10.0000 meq | INTRAVENOUS | Status: AC
Start: 2022-12-29 — End: 2022-12-29
  Administered 2022-12-29 (×4): 10 meq via INTRAVENOUS
  Filled 2022-12-29 (×4): qty 100

## 2022-12-29 MED ORDER — POTASSIUM CHLORIDE 20 MEQ PO PACK
60.0000 meq | PACK | Freq: Once | ORAL | Status: AC
  Administered 2022-12-29: 60 meq via ORAL
  Filled 2022-12-29: qty 3

## 2022-12-29 NOTE — Progress Notes (Signed)
Laura [name]   DOB:09-08-1965   WU#:[id number]    ASSESSMENT & PLAN:   56 year old female with history of metastatic melanoma presenting with confusion.  She recently started immunotherapy and received 1 dose of ipilimumab and nivolumab.  Confusion Resolved on hydration Suspect from morphine in the setting of AKI/dehydration.  Dose of morphine has been decreased.   AKI Resolved on hydration Fluid goal 70 ounces per day   Brain metastases Not a primary cause of confusion. Patient will be monitored as outpatient in the future after 2 months of treatment   Metastatic malignant melanoma (HCC) Extensive metastases with large burden.  Due to this, urgently started systemic treatment instead of SRS to brain metastases. Will continue ipi/nivo as able.  Only received 1 dose so far so too early to judge systemic progression.  Spoke to patient and family, it takes about 2 months to see response.  She is clinically stable.  Hypokalemia Replacement per primary team.   Pain from bone metastases Appear in control. Agree decrease morphine to 15 mg twice daily for now Oxycodone in between for breakthrough pain and monitor for constipation, confusion.   Discharge planning If potassium replaced may discharge.     Thank you for the consult. She will see me on Thursday with repeat labs.  Melven Sartorius, MD 12/29/2022 12:30 PM  Subjective:  Laura [name] reports feeling better today.  No confusion.  No headache, nausea or vomiting.  No abdominal pain.  Appetite is not great.  No chest pain, wheezing or shortness of breath.  Objective:  Vitals:   12/28/22 2300 12/29/22 0413  BP: (!) 105/59 106/65  Pulse: (!) 115 (!) 112  Resp: 17 15  Temp: 98.7 F (37.1 C) 98.5 F (36.9 C)  SpO2: 91% 90%     Intake/Output Summary (Last 24 hours) at 12/29/2022 1230 Last data filed at 12/29/2022 0759 Gross per 24 hour  Intake 255 ml  Output --  Net 255 ml    GENERAL: alert, no distress and  comfortable SKIN: skin color normal.  No jaundice EYES: normal, sclera clear OROPHARYNX: moist, no exudate, no erythema.  NECK: supple, no palpable mass LUNGS: No wheeze, rales and clear to auscultation bilaterally with normal breathing effort.  HEART: regular rate & rhythm  ABDOMEN: abdomen soft, non-tender and non-distended Musculoskeletal: no lower extremity edema NEURO: alert with fluent speech, no focal motor/sensory deficits.  Strength equal bilaterally   Labs:  Recent Labs    12/27/22 2246 12/28/22 0729 12/29/22 0418  NA 132* 132* 133*  K 2.7* 3.1* 2.7*  CL 89* 95* 99  CO2 30 29 30   GLUCOSE 135* 122* 100*  BUN 18 13 10   CREATININE 1.37* 1.04* 0.98  CALCIUM 10.6* 9.4 8.7*  GFRNONAA 46* >60 >60  PROT 7.2 5.3* 5.0*  ALBUMIN 3.0* 2.3* 2.1*  AST 128* 90* 77*  ALT 54* 41 35  ALKPHOS 334* 234* 228*  BILITOT 1.1 0.8 1.0    Studies:  CT Angio Chest PE W and/or Wo Contrast  Result Date: 12/28/2022 CLINICAL DATA:  Patient with metastatic melanoma, new transaminitis, pulmonary embolism suspected. EXAM: CT ANGIOGRAPHY CHEST CT ABDOMEN AND PELVIS WITH CONTRAST TECHNIQUE: Multidetector CT imaging of the chest was performed using the standard protocol during bolus administration of intravenous contrast. Multiplanar CT image reconstructions and MIPs were obtained to evaluate the vascular anatomy. Multidetector CT imaging of the abdomen and pelvis was performed using the standard protocol during bolus administration of intravenous contrast. RADIATION DOSE REDUCTION: This exam  was performed according to the departmental dose-optimization program which includes automated exposure control, adjustment of the mA and/or kV according to patient size and/or use of iterative reconstruction technique. CONTRAST:  OMNIPAQUE IOHEXOL 350 MG/ML SOLN COMPARISON:  PET-CT recently 12/09/2022, and CTA chest, abdomen and pelvis 11/16/2022. FINDINGS: CTA CHEST FINDINGS Cardiovascular: The cardiac size is  normal. No arterial dilatation or embolism is seen. Minimal pericardial effusion anteriorly. Scattered calcific plaques lad coronary artery. Mild aortic atherosclerosis. There is no aneurysm, stenosis or dissection. The great vessels are widely patent. Pulmonary veins are nondistended. Mediastinum/Nodes: Enlarged superior left hilar lymph node is 1.3 cm in short axis, previously 1.1 cm. No other intrathoracic adenopathy is seen. 1.2 cm hypodense nodule in the inferior pole of the left lobe of the thyroid gland is unchanged. No follow-up imaging is recommended. Lungs/Pleura: 1.5 cm pulmonary metastasis left upper lobe, level of the aortic arch, unchanged from the PET-CT, 11/16/2022 was 1.2 cm. There are several new nodules in the right middle lobe, most are 3 mm or smaller for example on series 13 axial images 80 6-99. Largest of these is 5 mm anteriorly on 13:90, concerning for new metastases. In the right lower lobe there is a new 5 mm nodule about the fissure on 13:90. Mild paraseptal emphysema again seen in the lung apices. There are coarse atelectatic bands in the posterior bases. On the left there is increased conspicuity of fissural nodules in the lower lung field, largest of these is about 3 mm. There is a new nodule in the left lower lobe measuring 5 mm on 13:99 and a new 3 mm apical left lung nodule on 13:44. There is no pleural effusion, no other new lung abnormality. No focal consolidation. Musculoskeletal: There is chronic interbody fusion hardware in the lower cervical spine. Multifocal lytic metastatic disease particularly in the thoracic spine but also in the ribs and sternum, with pathologic compression fracture of the T3 vertebral body, worsening lytic metastasis T4 body with extension into the spinal canal and increased erosion through the posterior cortex, with increased soft tissue infiltration into the left T3-4 foramen. Similar increased posterior cortical erosion and metastatic extension into  the spinal canal at T8. Mass effect on the cord could be present at these levels. Review of the MIP images confirms the above findings. CT ABDOMEN and PELVIS FINDINGS Hepatobiliary: Innumerable diffuse metastases to the liver largest is in segment 2, today 7.5 cm, on the PET-CT 7.4 cm, the next largest is medially in segment 5 measuring 3.9 cm, previously 3.3 cm. Gallbladder is absent without bile duct dilatation. Pancreas: No abnormality. Spleen: New scattered nonspecific subcentimeter hypodensities are noted in the inferior aspect of the spleen, possible metastases. Largest is 8 mm on 19:24. Adrenals/Urinary Tract: Stable 1 cm left adrenal nodule, was not hypermetabolic on PET-CT. No right adrenal or broad or renal mass. No urinary stone or obstruction. Unremarkable bladder for the degree of distention. Stomach/Bowel: Unremarkable contracted stomach. Normal caliber unopacified small bowel. Normal appendix. Moderate fecal stasis ascending and transverse colon. Distal descending colon and sigmoid either demonstrating mild wall thickening or underdistention. Unremarkable rectum. Vascular/Lymphatic: Aortic atherosclerosis. No enlarged abdominal or pelvic lymph nodes. Reproductive: Status post hysterectomy. No adnexal masses. Multiple pelvic phleboliths. Other: No ascites or free air, no incarcerated hernia. Musculoskeletal: Progressive lytic metastatic disease, including compared with 12/09/2022, is noted at multiple levels of the lumbar spine, throughout the sacrum and bony pelvis, and with multiple small lytic lesions in both proximal femurs. There is increased  pathologic compression fracture of the L5 vertebral body with soft tissue herniation into the spinal canal severely narrowing the spinal canal from the L4-5 to the L5-S1 level. There is an enhancing mass in the right dorsal paraspinous musculature at L2-3 measuring 2.3 cm and was seen on 12/09/2022. Destructive mass of S3 to the left today measures 3 cm,  previously 2.6 cm. Review of the MIP images confirms the above findings. IMPRESSION: 1. No evidence of arterial dilatation or embolism. 2. 1.5 cm pulmonary metastasis left upper lobe, unchanged from the PET-CT, 1.2 cm on 11/16/2022. 3. Multiple new nodules in the right middle lobe, right lower lobe, and left apical lung, largest 5 mm, concerning for new metastases. 4. Increased conspicuity of fissural nodules in the left lower lung field. 5. Increased size of a left hilar lymph node. 6. Innumerable diffuse metastases to the liver, largest 7.5 cm in segment 2, previously 7.4 cm. 7. New scattered subcentimeter hypodensities in the inferior spleen, possible metastases. 8. Progressive lytic metastatic disease throughout the spine, sacrum, bony pelvis, and proximal femurs. 9. Increased pathologic compression fracture of the T3 and L5 vertebral body with soft tissue herniation into the spinal canal severely narrowing the spinal canal from the L4-5 to the L5-S1 level, and in the thoracic spine with increased soft tissue infiltration of the spinal canal at T4 and T8, and in the left T3-4 foramen. 10. Stable 2.3 cm enhancing mass in the right dorsal paraspinous musculature at L2-3. 11. Constipation. 12. Distal descending colon and sigmoid either demonstrating mild wall thickening or underdistention. Correlate clinically for underlying colitis. 13. Aortic and coronary artery atherosclerosis. Aortic Atherosclerosis (ICD10-I70.0) and Emphysema (ICD10-J43.9). Electronically Signed   By: Almira Bar M.D.   On: 12/28/2022 02:39   CT ABDOMEN PELVIS W CONTRAST  Result Date: 12/28/2022 CLINICAL DATA:  Patient with metastatic melanoma, new transaminitis, pulmonary embolism suspected. EXAM: CT ANGIOGRAPHY CHEST CT ABDOMEN AND PELVIS WITH CONTRAST TECHNIQUE: Multidetector CT imaging of the chest was performed using the standard protocol during bolus administration of intravenous contrast. Multiplanar CT image reconstructions and  MIPs were obtained to evaluate the vascular anatomy. Multidetector CT imaging of the abdomen and pelvis was performed using the standard protocol during bolus administration of intravenous contrast. RADIATION DOSE REDUCTION: This exam was performed according to the departmental dose-optimization program which includes automated exposure control, adjustment of the mA and/or kV according to patient size and/or use of iterative reconstruction technique. CONTRAST:  OMNIPAQUE IOHEXOL 350 MG/ML SOLN COMPARISON:  PET-CT recently 12/09/2022, and CTA chest, abdomen and pelvis 11/16/2022. FINDINGS: CTA CHEST FINDINGS Cardiovascular: The cardiac size is normal. No arterial dilatation or embolism is seen. Minimal pericardial effusion anteriorly. Scattered calcific plaques lad coronary artery. Mild aortic atherosclerosis. There is no aneurysm, stenosis or dissection. The great vessels are widely patent. Pulmonary veins are nondistended. Mediastinum/Nodes: Enlarged superior left hilar lymph node is 1.3 cm in short axis, previously 1.1 cm. No other intrathoracic adenopathy is seen. 1.2 cm hypodense nodule in the inferior pole of the left lobe of the thyroid gland is unchanged. No follow-up imaging is recommended. Lungs/Pleura: 1.5 cm pulmonary metastasis left upper lobe, level of the aortic arch, unchanged from the PET-CT, 11/16/2022 was 1.2 cm. There are several new nodules in the right middle lobe, most are 3 mm or smaller for example on series 13 axial images 80 6-99. Largest of these is 5 mm anteriorly on 13:90, concerning for new metastases. In the right lower lobe there is a new 5  mm nodule about the fissure on 13:90. Mild paraseptal emphysema again seen in the lung apices. There are coarse atelectatic bands in the posterior bases. On the left there is increased conspicuity of fissural nodules in the lower lung field, largest of these is about 3 mm. There is a new nodule in the left lower lobe measuring 5 mm on 13:99  and a new 3 mm apical left lung nodule on 13:44. There is no pleural effusion, no other new lung abnormality. No focal consolidation. Musculoskeletal: There is chronic interbody fusion hardware in the lower cervical spine. Multifocal lytic metastatic disease particularly in the thoracic spine but also in the ribs and sternum, with pathologic compression fracture of the T3 vertebral body, worsening lytic metastasis T4 body with extension into the spinal canal and increased erosion through the posterior cortex, with increased soft tissue infiltration into the left T3-4 foramen. Similar increased posterior cortical erosion and metastatic extension into the spinal canal at T8. Mass effect on the cord could be present at these levels. Review of the MIP images confirms the above findings. CT ABDOMEN and PELVIS FINDINGS Hepatobiliary: Innumerable diffuse metastases to the liver largest is in segment 2, today 7.5 cm, on the PET-CT 7.4 cm, the next largest is medially in segment 5 measuring 3.9 cm, previously 3.3 cm. Gallbladder is absent without bile duct dilatation. Pancreas: No abnormality. Spleen: New scattered nonspecific subcentimeter hypodensities are noted in the inferior aspect of the spleen, possible metastases. Largest is 8 mm on 19:24. Adrenals/Urinary Tract: Stable 1 cm left adrenal nodule, was not hypermetabolic on PET-CT. No right adrenal or broad or renal mass. No urinary stone or obstruction. Unremarkable bladder for the degree of distention. Stomach/Bowel: Unremarkable contracted stomach. Normal caliber unopacified small bowel. Normal appendix. Moderate fecal stasis ascending and transverse colon. Distal descending colon and sigmoid either demonstrating mild wall thickening or underdistention. Unremarkable rectum. Vascular/Lymphatic: Aortic atherosclerosis. No enlarged abdominal or pelvic lymph nodes. Reproductive: Status post hysterectomy. No adnexal masses. Multiple pelvic phleboliths. Other: No ascites  or free air, no incarcerated hernia. Musculoskeletal: Progressive lytic metastatic disease, including compared with 12/09/2022, is noted at multiple levels of the lumbar spine, throughout the sacrum and bony pelvis, and with multiple small lytic lesions in both proximal femurs. There is increased pathologic compression fracture of the L5 vertebral body with soft tissue herniation into the spinal canal severely narrowing the spinal canal from the L4-5 to the L5-S1 level. There is an enhancing mass in the right dorsal paraspinous musculature at L2-3 measuring 2.3 cm and was seen on 12/09/2022. Destructive mass of S3 to the left today measures 3 cm, previously 2.6 cm. Review of the MIP images confirms the above findings. IMPRESSION: 1. No evidence of arterial dilatation or embolism. 2. 1.5 cm pulmonary metastasis left upper lobe, unchanged from the PET-CT, 1.2 cm on 11/16/2022. 3. Multiple new nodules in the right middle lobe, right lower lobe, and left apical lung, largest 5 mm, concerning for new metastases. 4. Increased conspicuity of fissural nodules in the left lower lung field. 5. Increased size of a left hilar lymph node. 6. Innumerable diffuse metastases to the liver, largest 7.5 cm in segment 2, previously 7.4 cm. 7. New scattered subcentimeter hypodensities in the inferior spleen, possible metastases. 8. Progressive lytic metastatic disease throughout the spine, sacrum, bony pelvis, and proximal femurs. 9. Increased pathologic compression fracture of the T3 and L5 vertebral body with soft tissue herniation into the spinal canal severely narrowing the spinal canal from the L4-5  to the L5-S1 level, and in the thoracic spine with increased soft tissue infiltration of the spinal canal at T4 and T8, and in the left T3-4 foramen. 10. Stable 2.3 cm enhancing mass in the right dorsal paraspinous musculature at L2-3. 11. Constipation. 12. Distal descending colon and sigmoid either demonstrating mild wall thickening or  underdistention. Correlate clinically for underlying colitis. 13. Aortic and coronary artery atherosclerosis. Aortic Atherosclerosis (ICD10-I70.0) and Emphysema (ICD10-J43.9). Electronically Signed   By: Almira Bar M.D.   On: 12/28/2022 02:39   CT HEAD WO CONTRAST ( )  Result Date: 12/27/2022 CLINICAL DATA:  Delirium.  History of brain metastases. EXAM: CT HEAD WITHOUT CONTRAST TECHNIQUE: Contiguous axial images were obtained from the base of the skull through the vertex without intravenous contrast. RADIATION DOSE REDUCTION: This exam was performed according to the departmental dose-optimization program which includes automated exposure control, adjustment of the mA and/or kV according to patient size and/or use of iterative reconstruction technique. COMPARISON:  Head CT 11/11/2022.  MRI brain 12/17/2022. FINDINGS: Brain: No evidence of acute infarction, hemorrhage, hydrocephalus, or extra-axial fluid collection. There are 2 rounded hyperdense metastatic lesions in the right frontal cortex seen best on coronal imaging. These are unchanged from prior MRI. There is also 2 mm hyperdense metastatic lesion in the right occipital region best seen on coronal image 5/46. This is not well appreciated on prior MRI. There is no mass effect. Minimal patchy periventricular white matter hypodensity appears similar to prior. Vascular: No hyperdense vessel or unexpected calcification. Skull: Normal. Negative for fracture or focal lesion. Sinuses/Orbits: No acute finding. Other: None. IMPRESSION: 1. No acute intracranial process. 2. Two stable rounded hyperdense metastatic lesions in the right frontal cortex. There is also a 2 mm hyperdense metastatic lesion in the right occipital region which is not well appreciated on prior MRI. No mass effect. Electronically Signed   By: Darliss Cheney M.D.   On: 12/27/2022 23:20   DG Chest Port 1 View  Result Date: 12/27/2022 CLINICAL DATA:  Questionable sepsis - evaluate for  abnormality EXAM: PORTABLE CHEST 1 VIEW COMPARISON:  Chest x-ray 11/16/2022, CT angio chest 11/16/2022, PET CT 12/09/2022 FINDINGS: The heart and mediastinal contours are within normal limits. Aortic calcification. Redemonstration of an approximate 1.6 cm left upper quadrant pulmonary better evaluated on PET-CT 12/09/2022. No focal consolidation. No pulmonary edema. No pleural effusion. No pneumothorax. No acute osseous abnormality.  Cervical spine surgical hardware. IMPRESSION: 1. No acute cardiopulmonary abnormality. 2. Redemonstration of an approximate 1.6 cm left upper quadrant pulmonary better evaluated on PET-CT 12/09/2022. 3.  Aortic Atherosclerosis (ICD10-I70.0). Electronically Signed   By: Tish Frederickson M.D.   On: 12/27/2022 23:05   MR BRAIN W WO CONTRAST  Addendum Date: 12/17/2022   ADDENDUM REPORT: 12/17/2022 13:41 ADDENDUM: Study discussed by telephone with [name] COURTNEY WHARTON on 12/17/2022 at 1332 hours. Electronically Signed   By: Odessa Fleming M.D.   On: 12/17/2022 13:41   Result Date: 12/17/2022 CLINICAL DATA:  57 year old female with metastatic melanoma. Staging. Study under anesthesia. EXAM: MRI HEAD WITHOUT AND WITH CONTRAST TECHNIQUE: Multiplanar, multiecho pulse sequences of the brain and surrounding structures were obtained without and with intravenous contrast. CONTRAST:  5mL GADAVIST GADOBUTROL 1 MMOL/ML IV SOLN COMPARISON:  PET-CT 12/09/2022.  Head CT 11/11/2022. FINDINGS: Brain: Several small intrinsic T1 hyperintense (series 8, image 37) and/or enhancing (series 19, image 30) brain lesions compatible with small melanoma metastases. Only 1 of these demonstrates evidence of hemosiderin on SWI (series 7, image 72). No  associated cerebral edema or mass effect. Three such small metastases are identified including 3-4 mm at the right inferior frontal gyrus on series 19, image 30, 3-4 mm at the right operculum image 35, and 2-3 mm at the right middle frontal gyrus on image 38. Although  those lesions are relatively clustered, no additional brain metastases, abnormal enhancement, or abnormal susceptibility is identified elsewhere. No dural thickening. No superimposed restricted diffusion to suggest acute infarction. No midline shift, ventriculomegaly, extra-axial collection or acute intracranial hemorrhage. Cervicomedullary junction and pituitary are within normal limits. Scattered nonenhancing white matter T2 and FLAIR hyperintensity is moderate for age, nonspecific but most commonly small vessel related. Vascular: Major intracranial vascular flow voids are preserved. Dominant distal left vertebral artery. Following contrast major dural venous sinuses are enhancing and appear to be patent. Skull and upper cervical spine: Heterogeneous background bone marrow signal. Known osseous metastatic disease, but no destructive lesion identified in the skull or visible cervical spine. Grossly negative visible cervical spinal cord. Sinuses/Orbits: Negative orbits. Minor paranasal sinus mucosal thickening. Other: Minimal left mastoid effusion. Visible internal auditory structures appear normal. Intubated, study under anesthesia. Negative visible scalp and face. IMPRESSION: 1. Three small right hemisphere brain metastases metastases ranging from 2 mm to 4 mm. Minimal hemosiderin. No associated cerebral edema or mass effect. No other metastatic disease identified in the brain. 2. Known osseous metastatic disease but no destructive lesion identified in the skull or visible cervical spine. 3. Moderate for age cerebral white matter changes most commonly due to small vessel disease. Electronically Signed: By: Odessa Fleming M.D. On: 12/17/2022 13:16   MR LUMBAR SPINE W WO CONTRAST  Addendum Date: 12/17/2022   ADDENDUM REPORT: 12/17/2022 13:40 ADDENDUM: Study discussed by telephone with [name] COURTNEY WHARTON on 12/17/2022 at 1332 hours. And note that CLINICAL DATA should read 57 year old female with metastatic melanoma.  Staging. Study under anesthesia. Electronically Signed   By: Odessa Fleming M.D.   On: 12/17/2022 13:40   Result Date: 12/17/2022 EXAM: MRI LUMBAR SPINE WITHOUT AND WITH CONTRAST TECHNIQUE: Multiplanar and multiecho pulse sequences of the lumbar spine were obtained without and with intravenous contrast. CONTRAST:  5mL GADAVIST GADOBUTROL 1 MMOL/ML IV SOLN COMPARISON:  Brain MRI today reported separately. PET-CT 12/09/2022. CTA chest abdomen pelvis 11/16/2022. FINDINGS: Segmentation:  Normal on the comparison CTA. Alignment:  Mild straightening of lumbar lordosis now. Vertebrae: Diffuse skeletal metastatic disease throughout the visible spine and pelvis. Scout view demonstrates thoracic vertebral metastases with extension into the ventral epidural space at both the T4 and T8 levels (series 10, image 14). Pathologic fracture of the L5 vertebral body has progressed since 11/16/2022. L5 loss of height is now up to 60%, and there is retropulsed and/or epidural extension of tumor at that level (series 16, image 31), see additional spinal canal details below. Lumbar ventral epidural tumor also at the left L1 vertebral level without spinal stenosis (series 15, image 17), and at the L4 level on the right (series 15, image 25) without spinal stenosis. Incidental bulky extraosseous tumor also from the right L1 transverse process, 2.2 cm right paraspinal muscle mass there on series 15, image 7. No other pathologic fracture identified. Conus medullaris and cauda equina: Conus extends to the L1 level. No lower spinal cord or conus signal abnormality. But there is an indistinct appearance of the surface of the lower thoracic spinal cord on T2 imaging (series 12, image 8), and indeterminate enhancement pattern at the conus (series 17, image 7). No obvious nerve root  thickening or enhancement in the cauda equina. Paraspinal and other soft tissues: Right L1 paraspinal muscle tumor arising from the posterior elements at that level as  above. Partially visible extensive liver tumor. No retroperitoneal, prevertebral lymphadenopathy. Disc levels: No significant lumbar spine degeneration superimposed on the diffuse metastatic disease. Ventral epidural tumor at L2 and L4 without spinal stenosis as above. Bulky tumor retropulsion, epidural extension at the L5 pathologic compression fracture level now resulting in severe spinal stenosis (series 15, image 31 and series 12, image 8), severe lateral recess stenosis (S1 nerve levels) and severe proximal L5 foraminal stenosis. IMPRESSION: 1. Diffuse skeletal metastatic disease. Involvement throughout the visible spine and pelvis. 2. Progressed pathologic fracture of the L5 since 10/21/2024with 60% loss of height and bulky retropulsed/epidural tumor. 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 3. 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 And scout view demonstrates thoracic spine ventral epidural tumor at both T4 and T8 levels. 4. 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 Electronically Signed: By: Odessa Fleming M.D. On: 12/17/2022 13:28   NM PET Image Initial (PI) Whole Body  Result Date: 12/09/2022 CLINICAL DATA:  Initial treatment strategy for metastatic melanoma. EXAM: NUCLEAR MEDICINE PET WHOLE BODY TECHNIQUE: 6.6 mCi F-18 FDG was injected intravenously. Full-ring PET imaging was performed from the head to foot after the radiotracer. CT data was obtained and used for attenuation correction and anatomic localization. Fasting blood glucose: 115 mg/dl COMPARISON:  CTA chest abdomen pelvis dated 11/16/2022 FINDINGS: Mediastinal blood pool activity: SUV max 2.1 HEAD/NECK: No hypermetabolic activity in the scalp. No hypermetabolic cervical lymph nodes. Incidental CT findings: none CHEST: 14 mm posterior left upper lobe pulmonary nodule (series 7/image 20), max SUV 8.7. 11 mm short axis left AP window node (series  4/image 82), max SUV 11.2. Incidental CT findings: Mild atherosclerotic calcifications of the aortic arch. Mild coronary atherosclerosis of the LAD. Mild centrilobular and paraseptal emphysematous changes, upper lung predominant. ABDOMEN/PELVIS: Multifocal hepatic metastases, approximately 10-15 in number, including: --7.4 cm mass in the lateral segment left hepatic lobe (series 4/image 112), max SUV 16.3 --2.9 cm mass in the medial aspect of segment 6 (series 4/image 122), max SUV 14.7 No abnormal hypermetabolism in the pancreas, spleen, or adrenal glands. No hypermetabolic abdominopelvic lymphadenopathy. Incidental CT findings: Status post cholecystectomy. Status post hysterectomy. Mild rectosigmoid colonic wall thickening (series 4/image 135), nonspecific. Atherosclerotic calcifications of the abdominal aorta and branch vessels. SKELETON: Multifocal/widespread osseous metastases, including: --Left humeral head, max SUV 6.4 --T4 vertebral body, max SUV 21.1 --Right lateral 6th rib, max SUV 4.2 --Right inferior sternum, max SUV 12.5 --L4 vertebral body, max SUV 16.3 --Left sacrum, max SUV 15.4 Hypermetabolism involving right anterior thigh musculature, favored to be physiologic. Incidental CT findings: none EXTREMITIES: No abnormal hypermetabolic activity in the lower extremities. Incidental CT findings: none IMPRESSION: Left upper lobe pulmonary metastasis, as above. Associated mediastinal nodal metastasis. Multifocal hepatic metastases, as above. Multifocal/widespread osseous metastases, as above. Electronically Signed   By: Charline Bills M.D.   On: 12/09/2022 23:34

## 2022-12-29 NOTE — Telephone Encounter (Signed)
Called pt to inform her of medication refill sent to [facility], no answer, LVM and callback number. RN also reached out to floor RN to relay refill information as well.

## 2022-12-29 NOTE — Discharge Summary (Signed)
Physician Discharge Summary  [name]:[id number] DOB: 02-15-1965 DOA: 12/27/2022  PCP: [name], [name]  Admit date: 12/27/2022 Discharge date: 12/29/2022  Admitted From: Home  Discharge disposition: Home   Recommendations for Outpatient Follow-Up:   Follow up with your primary care provider in one week.  Follow-up with oncology with blood work as has been scheduled.  Please correct hypokalemia if necessary with potassium supplements   Discharge Diagnosis:   Principal Problem:   Acute encephalopathy Active Problems:   Metastatic malignant melanoma (HCC)   Constipation   Neoplasm related pain   Hypokalemia   AKI (acute kidney injury) (HCC)   Acute respiratory failure with hypoxia (HCC)   Metastatic cancer to brain [facility])    Discharge Condition: Improved.  Diet recommendation: Regular.  Wound care: None.  Code status: Full.   History of Present Illness:   Laura [name] is a 57 y.o. female with medical history significant for metastatic malignant melanoma, cancer associated pain, and constipation to the hospital with confusion and somnolence for last 2 to 3 days with confusion.  Episodes seem to coincide with administration of her pain medications, but she became much more confused for 1 day prior to presentation with increased somnolence.  Also reported increasing fatigue with decreased appetite and not drinking much.  In the ED patient was afebrile.  Had hypoxia with pulse ox in the upper 80s on room air.  Was tachycardic..  Initial labs were notable for hypokalemia with potassium of 2.7.  Creatinine 1.3, alkaline phosphatase 334, AST 128, ALT 54,.  Negative respiratory viral panel and lactic acid.  KG showed sinus tachycardia.  Chest x-ray was negative for acute findings.  CT scan of the head showed possible new metastatic lesion in the right occipital lobe in addition to 2 stable right frontal cortex lesions.  CTA chest is negative for PE but notable  for multiple new lung nodules and increased adenopathy.  New splenic hypodensities where noted on CT of the abdomen and pelvis.  Progressive lytic metastatic disease is also noted on CT.   Blood cultures were collected in the ED and the patient was treated with 2 L of LR, Zofran, fentanyl, oral and IV potassium, cefepime, vancomycin, and Flagyl.  Patient was then considered for admission to the hospital for further evaluation and treatment.    Hospital Course:   Following conditions were addressed during hospitalization as listed below,  Acute metabolic encephalopathy  Continued with increased fatigue somnolence and confusion.  Likely multifactorial from volume depletion, acute kidney injury, pain medications,and electrolyte imbalance.  Patient received IV hydration and electrolytes were replenished.  MS Contin dose has been decreased and would recommend lower dose on discharge if possible..  Patient was afebrile with no leukocytosis and blood cultures were negative.  Urinalysis was negative for infection as well.   COVID influenza and RSV was negative.  TSH within normal range.  HIV is nonreactive.  This time patient is at her baseline mentation and is fully alert awake and oriented.   Acute hypoxic respiratory failure  - Saturating upper 80s on rm air in ED on presentation..  Chest x-ray negative including CT scan of the chest except for lung nodules and emphysema.  At this time patient is completely off supplemental oxygen.  Received incentive spirometry, during hospitalization.  Has pulmonary nodules on the CT scan.    Metastatic melanoma  - Diagnosed in October 2024, found to have metastases to lung, liver, bone, brain.  Melanoma on the  back status post resection.  Undergoing palliative radiation and treatment with nivolumab and ipilimumab under the care of Dr. Cherly Hensen oncology.  Seen by Dr. Cherly Hensen medical oncology regarding during hospitalization.  Patient does have blood work this Thursday with  plans for immunotherapy as outpatient.    Hypokalemia  Was replaced in the ED for initial potassium of 2.7..  Potassium today again at 2.7.  Patient will receive 50 mEq of IV potassium and 40 mEq of oral potassium prior to discharge.  She will also continue 20 mEq p.o. twice daily for next 5 days.  She does have an appointment to check her blood work this Thursday so she might need further dose depending upon the next blood work.  Patient however feels much better and at her baseline.  Magnesium was 2.0 and phosphorus of 3.1.   Mild hyponatremia.  Latest sodium of 133.   AKI  - SCr is 1.37 on admission, trended down to 0.9 after hydration.  Encouraged oral hydration on discharge.  Disposition.  At this time, patient is stable for disposition home with outpatient PCP and oncology follow-up.  Spoke with the patient's husband at bedside  Medical Consultants:   Medical oncology  Procedures:    None Subjective:   Today, patient was seen and examined at bedside.  Feels better.  At baseline.  Had some ankle swelling after the wheelchair hit her ankle but able to weight-bear.  Patient's husband at bedside.  Discharge Exam:   Vitals:   12/28/22 2300 12/29/22 0413  BP: (!) 105/59 106/65  Pulse: (!) 115 (!) 112  Resp: 17 15  Temp: 98.7 F (37.1 C) 98.5 F (36.9 C)  SpO2: 91% 90%   Vitals:   12/28/22 1601 12/28/22 2021 12/28/22 2300 12/29/22 0413  BP: 112/69 115/64 (!) 105/59 106/65  Pulse: (!) 110 (!) 119 (!) 115 (!) 112  Resp: 18 18 17 15   Temp: 97.9 F (36.6 C) 98.6 F (37 C) 98.7 F (37.1 C) 98.5 F (36.9 C)  TempSrc: Oral Oral Oral Oral  SpO2: 96% 97% 91% 90%  Weight:       Body mass index is 24.8 kg/m.  General: Alert awake, not in obvious distress HENT: pupils equally reacting to light,  No scleral pallor or icterus noted. Oral mucosa is moist.  Chest:  Clear breath sounds.  Diminished breath sounds bilaterally. No crackles or wheezes.  CVS: S1 &S2 heard. No murmur.   Regular rate and rhythm. Abdomen: Soft, nontender, nondistended.  Bowel sounds are heard.   Extremities: No cyanosis, clubbing or edema.  Peripheral pulses are palpable.  Right ankle mildly puffy.  Range of movement is okay. Psych: Alert, awake and oriented, normal mood CNS:  No cranial nerve deficits.  Power equal in all extremities.   Skin: Warm and dry.  No rashes noted.  The results of significant diagnostics from this hospitalization (including imaging, microbiology, ancillary and laboratory) are listed below for reference.     Diagnostic Studies:   CT Angio Chest PE W and/or Wo Contrast  Result Date: 12/28/2022 CLINICAL DATA:  Patient with metastatic melanoma, new transaminitis, pulmonary embolism suspected. EXAM: CT ANGIOGRAPHY CHEST CT ABDOMEN AND PELVIS WITH CONTRAST TECHNIQUE: Multidetector CT imaging of the chest was performed using the standard protocol during bolus administration of intravenous contrast. Multiplanar CT image reconstructions and MIPs were obtained to evaluate the vascular anatomy. Multidetector CT imaging of the abdomen and pelvis was performed using the standard protocol during bolus administration of intravenous contrast. RADIATION  DOSE REDUCTION: This exam was performed according to the departmental dose-optimization program which includes automated exposure control, adjustment of the mA and/or kV according to patient size and/or use of iterative reconstruction technique. CONTRAST:  OMNIPAQUE IOHEXOL 350 MG/ML SOLN COMPARISON:  PET-CT recently 12/09/2022, and CTA chest, abdomen and pelvis 11/16/2022. FINDINGS: CTA CHEST FINDINGS Cardiovascular: The cardiac size is normal. No arterial dilatation or embolism is seen. Minimal pericardial effusion anteriorly. Scattered calcific plaques lad coronary artery. Mild aortic atherosclerosis. There is no aneurysm, stenosis or dissection. The great vessels are widely patent. Pulmonary veins are nondistended. Mediastinum/Nodes:  Enlarged superior left hilar lymph node is 1.3 cm in short axis, previously 1.1 cm. No other intrathoracic adenopathy is seen. 1.2 cm hypodense nodule in the inferior pole of the left lobe of the thyroid gland is unchanged. No follow-up imaging is recommended. Lungs/Pleura: 1.5 cm pulmonary metastasis left upper lobe, level of the aortic arch, unchanged from the PET-CT, 11/16/2022 was 1.2 cm. There are several new nodules in the right middle lobe, most are 3 mm or smaller for example on series 13 axial images 80 6-99. Largest of these is 5 mm anteriorly on 13:90, concerning for new metastases. In the right lower lobe there is a new 5 mm nodule about the fissure on 13:90. Mild paraseptal emphysema again seen in the lung apices. There are coarse atelectatic bands in the posterior bases. On the left there is increased conspicuity of fissural nodules in the lower lung field, largest of these is about 3 mm. There is a new nodule in the left lower lobe measuring 5 mm on 13:99 and a new 3 mm apical left lung nodule on 13:44. There is no pleural effusion, no other new lung abnormality. No focal consolidation. Musculoskeletal: There is chronic interbody fusion hardware in the lower cervical spine. Multifocal lytic metastatic disease particularly in the thoracic spine but also in the ribs and sternum, with pathologic compression fracture of the T3 vertebral body, worsening lytic metastasis T4 body with extension into the spinal canal and increased erosion through the posterior cortex, with increased soft tissue infiltration into the left T3-4 foramen. Similar increased posterior cortical erosion and metastatic extension into the spinal canal at T8. Mass effect on the cord could be present at these levels. Review of the MIP images confirms the above findings. CT ABDOMEN and PELVIS FINDINGS Hepatobiliary: Innumerable diffuse metastases to the liver largest is in segment 2, today 7.5 cm, on the PET-CT 7.4 cm, the next largest is  medially in segment 5 measuring 3.9 cm, previously 3.3 cm. Gallbladder is absent without bile duct dilatation. Pancreas: No abnormality. Spleen: New scattered nonspecific subcentimeter hypodensities are noted in the inferior aspect of the spleen, possible metastases. Largest is 8 mm on 19:24. Adrenals/Urinary Tract: Stable 1 cm left adrenal nodule, was not hypermetabolic on PET-CT. No right adrenal or broad or renal mass. No urinary stone or obstruction. Unremarkable bladder for the degree of distention. Stomach/Bowel: Unremarkable contracted stomach. Normal caliber unopacified small bowel. Normal appendix. Moderate fecal stasis ascending and transverse colon. Distal descending colon and sigmoid either demonstrating mild wall thickening or underdistention. Unremarkable rectum. Vascular/Lymphatic: Aortic atherosclerosis. No enlarged abdominal or pelvic lymph nodes. Reproductive: Status post hysterectomy. No adnexal masses. Multiple pelvic phleboliths. Other: No ascites or free air, no incarcerated hernia. Musculoskeletal: Progressive lytic metastatic disease, including compared with 12/09/2022, is noted at multiple levels of the lumbar spine, throughout the sacrum and bony pelvis, and with multiple small lytic lesions in both proximal  femurs. There is increased pathologic compression fracture of the L5 vertebral body with soft tissue herniation into the spinal canal severely narrowing the spinal canal from the L4-5 to the L5-S1 level. There is an enhancing mass in the right dorsal paraspinous musculature at L2-3 measuring 2.3 cm and was seen on 12/09/2022. Destructive mass of S3 to the left today measures 3 cm, previously 2.6 cm. Review of the MIP images confirms the above findings. IMPRESSION: 1. No evidence of arterial dilatation or embolism. 2. 1.5 cm pulmonary metastasis left upper lobe, unchanged from the PET-CT, 1.2 cm on 11/16/2022. 3. Multiple new nodules in the right middle lobe, right lower lobe, and left  apical lung, largest 5 mm, concerning for new metastases. 4. Increased conspicuity of fissural nodules in the left lower lung field. 5. Increased size of a left hilar lymph node. 6. Innumerable diffuse metastases to the liver, largest 7.5 cm in segment 2, previously 7.4 cm. 7. New scattered subcentimeter hypodensities in the inferior spleen, possible metastases. 8. Progressive lytic metastatic disease throughout the spine, sacrum, bony pelvis, and proximal femurs. 9. Increased pathologic compression fracture of the T3 and L5 vertebral body with soft tissue herniation into the spinal canal severely narrowing the spinal canal from the L4-5 to the L5-S1 level, and in the thoracic spine with increased soft tissue infiltration of the spinal canal at T4 and T8, and in the left T3-4 foramen. 10. Stable 2.3 cm enhancing mass in the right dorsal paraspinous musculature at L2-3. 11. Constipation. 12. Distal descending colon and sigmoid either demonstrating mild wall thickening or underdistention. Correlate clinically for underlying colitis. 13. Aortic and coronary artery atherosclerosis. Aortic Atherosclerosis (ICD10-I70.0) and Emphysema (ICD10-J43.9). Electronically Signed   By: Almira Bar M.D.   On: 12/28/2022 02:39   CT ABDOMEN PELVIS W CONTRAST  Result Date: 12/28/2022 CLINICAL DATA:  Patient with metastatic melanoma, new transaminitis, pulmonary embolism suspected. EXAM: CT ANGIOGRAPHY CHEST CT ABDOMEN AND PELVIS WITH CONTRAST TECHNIQUE: Multidetector CT imaging of the chest was performed using the standard protocol during bolus administration of intravenous contrast. Multiplanar CT image reconstructions and MIPs were obtained to evaluate the vascular anatomy. Multidetector CT imaging of the abdomen and pelvis was performed using the standard protocol during bolus administration of intravenous contrast. RADIATION DOSE REDUCTION: This exam was performed according to the departmental dose-optimization program which  includes automated exposure control, adjustment of the mA and/or kV according to patient size and/or use of iterative reconstruction technique. CONTRAST:  OMNIPAQUE IOHEXOL 350 MG/ML SOLN COMPARISON:  PET-CT recently 12/09/2022, and CTA chest, abdomen and pelvis 11/16/2022. FINDINGS: CTA CHEST FINDINGS Cardiovascular: The cardiac size is normal. No arterial dilatation or embolism is seen. Minimal pericardial effusion anteriorly. Scattered calcific plaques lad coronary artery. Mild aortic atherosclerosis. There is no aneurysm, stenosis or dissection. The great vessels are widely patent. Pulmonary veins are nondistended. Mediastinum/Nodes: Enlarged superior left hilar lymph node is 1.3 cm in short axis, previously 1.1 cm. No other intrathoracic adenopathy is seen. 1.2 cm hypodense nodule in the inferior pole of the left lobe of the thyroid gland is unchanged. No follow-up imaging is recommended. Lungs/Pleura: 1.5 cm pulmonary metastasis left upper lobe, level of the aortic arch, unchanged from the PET-CT, 11/16/2022 was 1.2 cm. There are several new nodules in the right middle lobe, most are 3 mm or smaller for example on series 13 axial images 80 6-99. Largest of these is 5 mm anteriorly on 13:90, concerning for new metastases. In the right lower lobe there  is a new 5 mm nodule about the fissure on 13:90. Mild paraseptal emphysema again seen in the lung apices. There are coarse atelectatic bands in the posterior bases. On the left there is increased conspicuity of fissural nodules in the lower lung field, largest of these is about 3 mm. There is a new nodule in the left lower lobe measuring 5 mm on 13:99 and a new 3 mm apical left lung nodule on 13:44. There is no pleural effusion, no other new lung abnormality. No focal consolidation. Musculoskeletal: There is chronic interbody fusion hardware in the lower cervical spine. Multifocal lytic metastatic disease particularly in the thoracic spine but also in the  ribs and sternum, with pathologic compression fracture of the T3 vertebral body, worsening lytic metastasis T4 body with extension into the spinal canal and increased erosion through the posterior cortex, with increased soft tissue infiltration into the left T3-4 foramen. Similar increased posterior cortical erosion and metastatic extension into the spinal canal at T8. Mass effect on the cord could be present at these levels. Review of the MIP images confirms the above findings. CT ABDOMEN and PELVIS FINDINGS Hepatobiliary: Innumerable diffuse metastases to the liver largest is in segment 2, today 7.5 cm, on the PET-CT 7.4 cm, the next largest is medially in segment 5 measuring 3.9 cm, previously 3.3 cm. Gallbladder is absent without bile duct dilatation. Pancreas: No abnormality. Spleen: New scattered nonspecific subcentimeter hypodensities are noted in the inferior aspect of the spleen, possible metastases. Largest is 8 mm on 19:24. Adrenals/Urinary Tract: Stable 1 cm left adrenal nodule, was not hypermetabolic on PET-CT. No right adrenal or broad or renal mass. No urinary stone or obstruction. Unremarkable bladder for the degree of distention. Stomach/Bowel: Unremarkable contracted stomach. Normal caliber unopacified small bowel. Normal appendix. Moderate fecal stasis ascending and transverse colon. Distal descending colon and sigmoid either demonstrating mild wall thickening or underdistention. Unremarkable rectum. Vascular/Lymphatic: Aortic atherosclerosis. No enlarged abdominal or pelvic lymph nodes. Reproductive: Status post hysterectomy. No adnexal masses. Multiple pelvic phleboliths. Other: No ascites or free air, no incarcerated hernia. Musculoskeletal: Progressive lytic metastatic disease, including compared with 12/09/2022, is noted at multiple levels of the lumbar spine, throughout the sacrum and bony pelvis, and with multiple small lytic lesions in both proximal femurs. There is increased pathologic  compression fracture of the L5 vertebral body with soft tissue herniation into the spinal canal severely narrowing the spinal canal from the L4-5 to the L5-S1 level. There is an enhancing mass in the right dorsal paraspinous musculature at L2-3 measuring 2.3 cm and was seen on 12/09/2022. Destructive mass of S3 to the left today measures 3 cm, previously 2.6 cm. Review of the MIP images confirms the above findings. IMPRESSION: 1. No evidence of arterial dilatation or embolism. 2. 1.5 cm pulmonary metastasis left upper lobe, unchanged from the PET-CT, 1.2 cm on 11/16/2022. 3. Multiple new nodules in the right middle lobe, right lower lobe, and left apical lung, largest 5 mm, concerning for new metastases. 4. Increased conspicuity of fissural nodules in the left lower lung field. 5. Increased size of a left hilar lymph node. 6. Innumerable diffuse metastases to the liver, largest 7.5 cm in segment 2, previously 7.4 cm. 7. New scattered subcentimeter hypodensities in the inferior spleen, possible metastases. 8. Progressive lytic metastatic disease throughout the spine, sacrum, bony pelvis, and proximal femurs. 9. Increased pathologic compression fracture of the T3 and L5 vertebral body with soft tissue herniation into the spinal canal severely narrowing the spinal  canal from the L4-5 to the L5-S1 level, and in the thoracic spine with increased soft tissue infiltration of the spinal canal at T4 and T8, and in the left T3-4 foramen. 10. Stable 2.3 cm enhancing mass in the right dorsal paraspinous musculature at L2-3. 11. Constipation. 12. Distal descending colon and sigmoid either demonstrating mild wall thickening or underdistention. Correlate clinically for underlying colitis. 13. Aortic and coronary artery atherosclerosis. Aortic Atherosclerosis (ICD10-I70.0) and Emphysema (ICD10-J43.9). Electronically Signed   By: Almira Bar M.D.   On: 12/28/2022 02:39     Labs:   Basic Metabolic Panel: Recent Labs  Lab  12/27/22 2246 12/28/22 0729 12/29/22 0418  NA 132* 132* 133*  K 2.7* 3.1* 2.7*  CL 89* 95* 99  CO2 30 29 30   GLUCOSE 135* 122* 100*  BUN 18 13 10   CREATININE 1.37* 1.04* 0.98  CALCIUM 10.6* 9.4 8.7*  MG  --  2.2 2.0  PHOS  --  2.6 3.1   GFR Estimated Creatinine Clearance: 58.8 mL/min (by C-G formula based on SCr of 0.98 mg/dL). Liver Function Tests: Recent Labs  Lab 12/27/22 2246 12/28/22 0729 12/29/22 0418  AST 128* 90* 77*  ALT 54* 41 35  ALKPHOS 334* 234* 228*  BILITOT 1.1 0.8 1.0  PROT 7.2 5.3* 5.0*  ALBUMIN 3.0* 2.3* 2.1*   No results for input(s): "LIPASE", "AMYLASE" in the last 168 hours. Recent Labs  Lab 12/28/22 0729  AMMONIA 34   Coagulation profile Recent Labs  Lab 12/27/22 2246  INR 1.3*    CBC: Recent Labs  Lab 12/27/22 2246 12/28/22 0729 12/29/22 0418  WBC 6.3 5.4 5.4  NEUTROABS 4.7  --   --   HGB 11.8* 9.1* 9.0*  HCT 35.1* 27.6* 26.3*  MCV 92.9 94.8 93.6  PLT 305 227 223   Cardiac Enzymes: No results for input(s): "CKTOTAL", "CKMB", "CKMBINDEX", "TROPONINI" in the last 168 hours. BNP: Invalid input(s): "POCBNP" CBG: Recent Labs  Lab 12/27/22 2309  GLUCAP 118*   D-Dimer No results for input(s): "DDIMER" in the last 72 hours. Hgb A1c No results for input(s): "HGBA1C" in the last 72 hours. Lipid Profile No results for input(s): "CHOL", "HDL", "LDLCALC", "TRIG", "CHOLHDL", "LDLDIRECT" in the last 72 hours. Thyroid function studies Recent Labs    12/28/22 0731  TSH 0.894   Anemia work up No results for input(s): "VITAMINB12", "FOLATE", "FERRITIN", "TIBC", "IRON", "RETICCTPCT" in the last 72 hours. Microbiology Recent Results (from the past 240 hour(s))  Resp panel by RT-PCR (RSV, Flu A&B, Covid) Anterior Nasal Swab     Status: None   Collection Time: 12/27/22 12:30 AM   Specimen: Anterior Nasal Swab  Result Value Ref Range Status   SARS Coronavirus 2 by RT PCR NEGATIVE NEGATIVE Final    Comment: (NOTE) SARS-CoV-2 target  nucleic acids are NOT DETECTED.  The SARS-CoV-2 RNA is generally detectable in upper respiratory specimens during the acute phase of infection. The lowest concentration of SARS-CoV-2 viral copies this assay can detect is 138 copies/mL. A negative result does not preclude SARS-Cov-2 infection and should not be used as the sole basis for treatment or other patient management decisions. A negative result may occur with  improper specimen collection/handling, submission of specimen other than nasopharyngeal swab, presence of viral mutation(s) within the areas targeted by this assay, and inadequate number of viral copies(<138 copies/mL). A negative result must be combined with clinical observations, patient history, and epidemiological information. The expected result is Negative.  Fact Sheet for Patients:  BloggerCourse.com  Fact Sheet for Healthcare Providers:  SeriousBroker.it  This test is no t yet approved or cleared by the [place] FDA and  has been authorized for detection and/or diagnosis of SARS-CoV-2 by FDA under an Emergency Use Authorization (EUA). This EUA will remain  in effect (meaning this test can be used) for the duration of the COVID-19 declaration under Section 564(b)(1) of the Act, 21 U.S.C.section 360bbb-3(b)(1), unless the authorization is terminated  or revoked sooner.       Influenza A by PCR NEGATIVE NEGATIVE Final   Influenza B by PCR NEGATIVE NEGATIVE Final    Comment: (NOTE) The Xpert Xpress SARS-CoV-2/FLU/RSV plus assay is intended as an aid in the diagnosis of influenza from Nasopharyngeal swab specimens and should not be used as a sole basis for treatment. Nasal washings and aspirates are unacceptable for Xpert Xpress SARS-CoV-2/FLU/RSV testing.  Fact Sheet for Patients: BloggerCourse.com  Fact Sheet for Healthcare  Providers: SeriousBroker.it  This test is not yet approved or cleared by the [place] FDA and has been authorized for detection and/or diagnosis of SARS-CoV-2 by FDA under an Emergency Use Authorization (EUA). This EUA will remain in effect (meaning this test can be used) for the duration of the COVID-19 declaration under Section 564(b)(1) of the Act, 21 U.S.C. section 360bbb-3(b)(1), unless the authorization is terminated or revoked.     Resp Syncytial Virus by PCR NEGATIVE NEGATIVE Final    Comment: (NOTE) Fact Sheet for Patients: BloggerCourse.com  Fact Sheet for Healthcare Providers: SeriousBroker.it  This test is not yet approved or cleared by the [place] FDA and has been authorized for detection and/or diagnosis of SARS-CoV-2 by FDA under an Emergency Use Authorization (EUA). This EUA will remain in effect (meaning this test can be used) for the duration of the COVID-19 declaration under Section 564(b)(1) of the Act, 21 U.S.C. section 360bbb-3(b)(1), unless the authorization is terminated or revoked.  Performed at [facility], [address], [place], [place] [address]   Blood Culture (routine x 2)     Status: None (Preliminary result)   Collection Time: 12/27/22 10:46 PM   Specimen: BLOOD  Result Value Ref Range Status   Specimen Description BLOOD SITE NOT SPECIFIED  Final   Special Requests   Final    BOTTLES DRAWN AEROBIC AND ANAEROBIC Blood Culture adequate volume   Culture   Final    NO GROWTH 1 DAY Performed at [facility], [address] [place], [place] [address]    Report Status PENDING  Incomplete  Blood Culture (routine x 2)     Status: None (Preliminary result)   Collection Time: 12/28/22 12:07 AM   Specimen: BLOOD RIGHT ARM  Result Value Ref Range Status   Specimen Description BLOOD RIGHT ARM  Final   Special Requests   Final    BOTTLES  DRAWN AEROBIC AND ANAEROBIC Blood Culture results may not be optimal due to an inadequate volume of blood received in culture bottles   Culture   Final    NO GROWTH 1 DAY Performed at [facility], [address] [place], [place] [address]    Report Status PENDING  Incomplete     Discharge Instructions:   Discharge Instructions     Call MD for:  persistant nausea and vomiting   Complete by: As directed    Call MD for:  severe uncontrolled pain   Complete by: As directed    Call MD for:  temperature >  100.4   Complete by: As directed    Diet general   Complete by: As directed    Discharge instructions   Complete by: As directed    Follow-up with your primary care provider in 1 week.  Check Blood work as has been scheduled this week.  Continue potassium supplements as has been prescribed.  Follow-up with your oncologist as has been scheduled.  Seek medical attention for worsening symptoms.  Keep yourself hydrated.   Increase activity slowly   Complete by: As directed       Allergies as of 12/29/2022       Reactions   Aspirin Swelling   SWELLING REACTION UNSPECIFIED         Medication List     STOP taking these medications    ibuprofen 200 MG tablet Commonly known as: ADVIL   lidocaine 5 % Commonly known as: LIDODERM   magnesium citrate Soln   sucralfate 1 g tablet Commonly known as: Carafate       TAKE these medications    Ambien CR 6.25 MG CR tablet Generic drug: zolpidem Take 6.25 mg by mouth at bedtime.   Crestor 10 MG tablet Generic drug: rosuvastatin Take 10 mg by mouth daily.   cyclobenzaprine 5 MG tablet Commonly known as: FLEXERIL Take 1 tablet (5 mg total) by mouth 3 (three) times daily as needed for muscle spasms.   dronabinol 2.5 MG capsule Commonly known as: MARINOL Take 1 capsule (2.5 mg total) by mouth 2 (two) times daily before a meal.   famotidine-calcium carbonate-magnesium hydroxide 10-800-165 MG chewable tablet Commonly  known as: PEPCID COMPLETE Chew 1 tablet by mouth daily as needed (acid reflux).   HYDROmorphone 2 MG tablet Commonly known as: Dilaudid Take 1 tablet (2 mg total) by mouth every 6 (six) hours as needed for severe pain (pain score 7-10).   LORazepam 0.5 MG tablet Commonly known as: ATIVAN Take 1 tab 30 minutes before PET and 2 tabs before MRI   morphine 15 MG 12 hr tablet Commonly known as: MS CONTIN Take 1 tablet (15 mg total) by mouth every 12 (twelve) hours. What changed:  medication strength how much to take   ondansetron 8 MG tablet Commonly known as: ZOFRAN Take 1 tablet (8 mg total) by mouth every 8 (eight) hours as needed.   pantoprazole 40 MG tablet Commonly known as: Protonix Take 1 tablet (40 mg total) by mouth daily.   polyethylene glycol 17 g packet Commonly known as: MIRALAX / GLYCOLAX Take 17 g by mouth 2 (two) times daily.   potassium chloride SA 20 MEQ tablet Commonly known as: KLOR-CON M Take 1 tablet (20 mEq total) by mouth 2 (two) times daily for 5 days.   pregabalin 75 MG capsule Commonly known as: LYRICA Take 75 mg by mouth 3 (three) times daily.   promethazine 25 MG tablet Commonly known as: PHENERGAN Take 25 mg by mouth every 12 (twelve) hours as needed for nausea or vomiting.   senna-docusate 8.6-50 MG tablet Commonly known as: Senokot-S Take 1 tablet by mouth 2 (two) times daily.        Follow-up Information     [name], [name] Follow up in 1 week(s).   Specialty: Family Medicine Contact information: [address] [place] [address] [phone]                  Time coordinating discharge: 39 minutes  Signed:  Anali Cabanilla  [facility] 12/29/2022, 12:51 PM

## 2022-12-29 NOTE — Progress Notes (Signed)
Patient to discharge home after IV potassium completed.  Reviewed AVS and medications - patient verbalizes understanding.  No questions at this time

## 2022-12-30 ENCOUNTER — Ambulatory Visit: Payer: BC Managed Care – PPO | Admitting: Orthopedic Surgery

## 2022-12-30 NOTE — Assessment & Plan Note (Signed)
Continue ipi/nivo cycle 2 Monitor for new systemic symptoms

## 2022-12-30 NOTE — Assessment & Plan Note (Signed)
She has started palliative radiation Morphine 15 mg ER twice daily  Oxycodone every 6 hours as needed for breakthrough pain. Continue Lyrica 75mg  three times a day.  Continue follow-up with palliative care

## 2022-12-30 NOTE — Radiation Completion Notes (Signed)
Patient Name: Laura [name], Laura [name] MRN: [id number] Date of Birth: 1966/01/06 Referring Physician: Geanie Berlin, M.D. Date of Service: 2022-12-30 Radiation Oncologist: Margaretmary Bayley, M.D. [facility]                             RADIATION ONCOLOGY END OF TREATMENT NOTE     Diagnosis: C79.51 Secondary malignant neoplasm of bone Intent: Palliative     ==========DELIVERED PLANS==========  First Treatment Date: 2022-12-14 Last Treatment Date: 2022-12-28   Plan Name: Spine_L Site: Lumbar Spine Technique: 3D Mode: Photon Dose Per Fraction: 3 Gy Prescribed Dose (Delivered / Prescribed): 30 Gy / 30 Gy Prescribed Fxs (Delivered / Prescribed): 10 / 10     ==========ON TREATMENT VISIT DATES========== 2022-12-18, 2022-12-28     ==========UPCOMING VISITS==========       ==========APPENDIX - ON TREATMENT VISIT NOTES==========   See weekly On Treatment Notes in Epic for details in the Media tab (listed as Progress notes on the On Treatment Visit Dates listed above).

## 2022-12-30 NOTE — Progress Notes (Unsigned)
Patient Care Team: [name], [name] as PCP - General (Family Medicine) Pickenpack-Cousar, Arty Baumgartner, NP as Nurse Practitioner [facility] and Palliative Medicine)  Clinic Day:  12/31/2022  Referring physician: Melven Sartorius, MD  ASSESSMENT & PLAN:   Assessment & Plan: 57 year old female with history of metastatic melanoma undergoing first line treatment with ipilimumab and nivolumab.   Due to her presentation of heavy disease burden, she started systemic treatment soon after first visit.  Diagnosis: 11/25/2022. Diffuse liver, bone, lung metastases. Brain metastases after starting systemic treatment Treatment: 12/11/2022. First line ipi/nivo  Malignant melanoma metastatic to bone (HCC) Continue ipi/nivo cycle 2 If signs of grade 1 toxicity before cycle 3 on evaluation, will decrease ipilimumab to 1mg /kg. If grade 2 toxicities stop ipi and continue nivolumab. Management symptoms. If G3/4 stop both. Monitor for new systemic symptoms  Neoplasm related pain She has just completed palliative radiation to L spine Morphine 15 mg ER twice daily  dilaudid every 6 hours as needed for breakthrough pain. Continue Lyrica 75mg  three times a day.  Continue follow-up with palliative care  Hypokalemia Increase potassium rich food  Metastatic cancer to brain [facility]) Currently asymptomatic. Response with ipi/nivo is more than 50%.  Will continue monitor for new symptoms. If asymptomatic, repeat MRI in about 2 months   Follow up on 12/27 with APP for cycle 3 with labs and me on 1/3. Lab before visits.  The patient understands the plans discussed today and is in agreement with them.  She knows to contact our office if she develops concerns prior to her next appointment.  Melven Sartorius, MD  [facility] - A DEPT OF [facility] [address] [place] [address] Dept: [phone] Dept Fax: [phone]   No orders of  the defined types were placed in this encounter.     CHIEF COMPLAINT:  CC: Metastatic melanoma  Current Treatment: Ipilimumab with nivolumab  INTERVAL HISTORY:  Laura [name] is here today for repeat clinical assessment. Pain over lower back is about the same.  She is taking morphine 15 mg twice daily.  She thought she cannot take breakthrough pain medication. No headaches, visual change, double vision, abdominal pain, nausea, vomiting or distention.  No coughing or shortness of breath. P.o. intake is not great.  Trying to drink fluids about 60 ounces a day.  I have reviewed the past medical history, past surgical history, social history and family history with the patient and they are unchanged from previous note.  ALLERGIES:  is allergic to aspirin.  MEDICATIONS:  Current Outpatient Medications  Medication Sig Dispense Refill   AMBIEN CR 6.25 MG CR tablet Take 6.25 mg by mouth at bedtime.     CRESTOR 10 MG tablet Take 10 mg by mouth daily.     cyclobenzaprine (FLEXERIL) 5 MG tablet Take 1 tablet (5 mg total) by mouth 3 (three) times daily as needed for muscle spasms. 30 tablet 0   dronabinol (MARINOL) 2.5 MG capsule Take 1 capsule (2.5 mg total) by mouth 2 (two) times daily before a meal. 30 capsule 0   famotidine-calcium carbonate-magnesium hydroxide (PEPCID COMPLETE) 10-800-165 MG chewable tablet Chew 1 tablet by mouth daily as needed (acid reflux).     HYDROmorphone (DILAUDID) 2 MG tablet Take 1 tablet (2 mg total) by mouth every 6 (six) hours as needed for severe pain (pain score 7-10). 45 tablet 0   LORazepam (ATIVAN) 0.5 MG tablet Take 1 tab  30 minutes before PET and 2 tabs before MRI 30 tablet 0   morphine (MS CONTIN) 15 MG 12 hr tablet Take 1 tablet (15 mg total) by mouth every 12 (twelve) hours. 30 tablet 0   ondansetron (ZOFRAN) 8 MG tablet Take 1 tablet (8 mg total) by mouth every 8 (eight) hours as needed. 60 tablet 3   pantoprazole (PROTONIX) 40 MG tablet Take 1 tablet (40 mg  total) by mouth daily. 30 tablet 2   polyethylene glycol (MIRALAX / GLYCOLAX) 17 g packet Take 17 g by mouth 2 (two) times daily.     potassium chloride SA (KLOR-CON M) 20 MEQ tablet Take 1 tablet (20 mEq total) by mouth 2 (two) times daily for 5 days. 10 tablet 0   pregabalin (LYRICA) 75 MG capsule Take 75 mg by mouth 3 (three) times daily.     promethazine (PHENERGAN) 25 MG tablet Take 25 mg by mouth every 12 (twelve) hours as needed for nausea or vomiting.     senna-docusate (SENOKOT-S) 8.6-50 MG tablet Take 1 tablet by mouth 2 (two) times daily.     No current facility-administered medications for this visit.    HISTORY OF PRESENT ILLNESS:   Oncology History  Metastatic malignant melanoma (HCC)  11/16/2022 Imaging   She presented to the emergency room on 11/16/2022 for back pain x 2 weeks and central chest pain for several months   CTA CAP IMPRESSION: 1. Negative for aortic dissection or aneurysm. 2. 1.2 cm left upper lobe nodule.  For 3. Multiple ill-defined liver lesions, suspicious for metastatic disease. 4. Multiple lytic osseous lesions, suspicious for metastatic disease. Pathologic fracture of the superior endplate of L5   11/25/2022 Pathology Results   1. Liver, needle/core biopsy, Left hepatic lobe lesion :       - METASTATIC MELANOMA.    12/04/2022 Initial Diagnosis   Melanoma of skin (HCC) Liver, bone metastases. 1.2 cm LUL nodule   12/09/2022 PET scan   PET: Diffuse bone and liver metastases.  Lung metastases.    12/11/2022 -  Chemotherapy   Patient is on Treatment Plan : MELANOMA Nivolumab (1) + Ipilimumab (3) q21d / Nivolumab (480) q28d     12/17/2022 Imaging   MRI brain 1. Three small right hemisphere brain metastases metastases ranging from 2 mm to 4 mm. Minimal hemosiderin. No associated cerebral edema or mass effect. No other metastatic disease identified in the brain.   2. Known osseous metastatic disease but no destructive lesion identified in the  skull or visible cervical spine.   3. Moderate for age cerebral white matter changes most commonly due to small vessel disease.       REVIEW OF SYSTEMS:   All relevant systems were reviewed with the patient and are negative.   VITALS:  Blood pressure 107/70, pulse (!) 133, temperature (!) 97.2 F (36.2 C), resp. rate 18, weight 132 lb (59.9 kg), SpO2 95%.  Wt Readings from Last 3 Encounters:  12/31/22 132 lb (59.9 kg)  12/27/22 140 lb (63.5 kg)  12/18/22 132 lb 8 oz (60.1 kg)    Body mass index is 23.38 kg/m.  Performance status (ECOG): 2 - Symptomatic, <50% confined to bed  PHYSICAL EXAM:   GENERAL:alert, no distress and comfortable SKIN: skin color normal, no rashes  EYES: normal, sclera clear OROPHARYNX: no exudate, no erythema    NECK: supple,  non-tender, without nodularity LYMPH:  no palpable cervical lymphadenopathy LUNGS: clear to auscultation with normal breathing effort.  No wheeze  or rales HEART: Tachycardic and no lower extremity edema ABDOMEN: abdomen soft, non-tender and nondistended Musculoskeletal: no edema NEURO: alert, fluent speech, no focal motor/sensory deficits.  Strength and sensation equal bilaterally.  LABORATORY DATA:  I have reviewed the data as listed    Component Value Date/Time   NA 136 12/31/2022 1303   K 3.4 (L) 12/31/2022 1303   CL 97 (L) 12/31/2022 1303   CO2 34 (H) 12/31/2022 1303   GLUCOSE 107 (H) 12/31/2022 1303   BUN 14 12/31/2022 1303   CREATININE 1.09 (H) 12/31/2022 1303   CALCIUM 10.3 12/31/2022 1303   PROT 6.2 (L) 12/31/2022 1303   ALBUMIN 3.1 (L) 12/31/2022 1303   AST 102 (H) 12/31/2022 1303   ALT 40 12/31/2022 1303   ALKPHOS 384 (H) 12/31/2022 1303   BILITOT 1.1 12/31/2022 1303   GFRNONAA >60 12/31/2022 1303   GFRAA >60 04/20/2018 0823    No results found for: "SPEP", "UPEP"  Lab Results  Component Value Date   WBC 7.8 12/31/2022   NEUTROABS 6.2 12/31/2022   HGB 10.7 (L) 12/31/2022   HCT 32.3 (L)  12/31/2022   MCV 93.9 12/31/2022   PLT 301 12/31/2022      Chemistry      Component Value Date/Time   NA 136 12/31/2022 1303   K 3.4 (L) 12/31/2022 1303   CL 97 (L) 12/31/2022 1303   CO2 34 (H) 12/31/2022 1303   BUN 14 12/31/2022 1303   CREATININE 1.09 (H) 12/31/2022 1303      Component Value Date/Time   CALCIUM 10.3 12/31/2022 1303   ALKPHOS 384 (H) 12/31/2022 1303   AST 102 (H) 12/31/2022 1303   ALT 40 12/31/2022 1303   BILITOT 1.1 12/31/2022 1303       RADIOGRAPHIC STUDIES: I have personally reviewed the radiological images as listed and agreed with the findings in the report. CT Angio Chest PE W and/or Wo Contrast  Result Date: 12/28/2022 CLINICAL DATA:  Patient with metastatic melanoma, new transaminitis, pulmonary embolism suspected. EXAM: CT ANGIOGRAPHY CHEST CT ABDOMEN AND PELVIS WITH CONTRAST TECHNIQUE: Multidetector CT imaging of the chest was performed using the standard protocol during bolus administration of intravenous contrast. Multiplanar CT image reconstructions and MIPs were obtained to evaluate the vascular anatomy. Multidetector CT imaging of the abdomen and pelvis was performed using the standard protocol during bolus administration of intravenous contrast. RADIATION DOSE REDUCTION: This exam was performed according to the departmental dose-optimization program which includes automated exposure control, adjustment of the mA and/or kV according to patient size and/or use of iterative reconstruction technique. CONTRAST:  OMNIPAQUE IOHEXOL 350 MG/ML SOLN COMPARISON:  PET-CT recently 12/09/2022, and CTA chest, abdomen and pelvis 11/16/2022. FINDINGS: CTA CHEST FINDINGS Cardiovascular: The cardiac size is normal. No arterial dilatation or embolism is seen. Minimal pericardial effusion anteriorly. Scattered calcific plaques lad coronary artery. Mild aortic atherosclerosis. There is no aneurysm, stenosis or dissection. The great vessels are widely patent. Pulmonary  veins are nondistended. Mediastinum/Nodes: Enlarged superior left hilar lymph node is 1.3 cm in short axis, previously 1.1 cm. No other intrathoracic adenopathy is seen. 1.2 cm hypodense nodule in the inferior pole of the left lobe of the thyroid gland is unchanged. No follow-up imaging is recommended. Lungs/Pleura: 1.5 cm pulmonary metastasis left upper lobe, level of the aortic arch, unchanged from the PET-CT, 11/16/2022 was 1.2 cm. There are several new nodules in the right middle lobe, most are 3 mm or smaller for example on series 13 axial images 80  6-99. Largest of these is 5 mm anteriorly on 13:90, concerning for new metastases. In the right lower lobe there is a new 5 mm nodule about the fissure on 13:90. Mild paraseptal emphysema again seen in the lung apices. There are coarse atelectatic bands in the posterior bases. On the left there is increased conspicuity of fissural nodules in the lower lung field, largest of these is about 3 mm. There is a new nodule in the left lower lobe measuring 5 mm on 13:99 and a new 3 mm apical left lung nodule on 13:44. There is no pleural effusion, no other new lung abnormality. No focal consolidation. Musculoskeletal: There is chronic interbody fusion hardware in the lower cervical spine. Multifocal lytic metastatic disease particularly in the thoracic spine but also in the ribs and sternum, with pathologic compression fracture of the T3 vertebral body, worsening lytic metastasis T4 body with extension into the spinal canal and increased erosion through the posterior cortex, with increased soft tissue infiltration into the left T3-4 foramen. Similar increased posterior cortical erosion and metastatic extension into the spinal canal at T8. Mass effect on the cord could be present at these levels. Review of the MIP images confirms the above findings. CT ABDOMEN and PELVIS FINDINGS Hepatobiliary: Innumerable diffuse metastases to the liver largest is in segment 2, today 7.5  cm, on the PET-CT 7.4 cm, the next largest is medially in segment 5 measuring 3.9 cm, previously 3.3 cm. Gallbladder is absent without bile duct dilatation. Pancreas: No abnormality. Spleen: New scattered nonspecific subcentimeter hypodensities are noted in the inferior aspect of the spleen, possible metastases. Largest is 8 mm on 19:24. Adrenals/Urinary Tract: Stable 1 cm left adrenal nodule, was not hypermetabolic on PET-CT. No right adrenal or broad or renal mass. No urinary stone or obstruction. Unremarkable bladder for the degree of distention. Stomach/Bowel: Unremarkable contracted stomach. Normal caliber unopacified small bowel. Normal appendix. Moderate fecal stasis ascending and transverse colon. Distal descending colon and sigmoid either demonstrating mild wall thickening or underdistention. Unremarkable rectum. Vascular/Lymphatic: Aortic atherosclerosis. No enlarged abdominal or pelvic lymph nodes. Reproductive: Status post hysterectomy. No adnexal masses. Multiple pelvic phleboliths. Other: No ascites or free air, no incarcerated hernia. Musculoskeletal: Progressive lytic metastatic disease, including compared with 12/09/2022, is noted at multiple levels of the lumbar spine, throughout the sacrum and bony pelvis, and with multiple small lytic lesions in both proximal femurs. There is increased pathologic compression fracture of the L5 vertebral body with soft tissue herniation into the spinal canal severely narrowing the spinal canal from the L4-5 to the L5-S1 level. There is an enhancing mass in the right dorsal paraspinous musculature at L2-3 measuring 2.3 cm and was seen on 12/09/2022. Destructive mass of S3 to the left today measures 3 cm, previously 2.6 cm. Review of the MIP images confirms the above findings. IMPRESSION: 1. No evidence of arterial dilatation or embolism. 2. 1.5 cm pulmonary metastasis left upper lobe, unchanged from the PET-CT, 1.2 cm on 11/16/2022. 3. Multiple new nodules in the  right middle lobe, right lower lobe, and left apical lung, largest 5 mm, concerning for new metastases. 4. Increased conspicuity of fissural nodules in the left lower lung field. 5. Increased size of a left hilar lymph node. 6. Innumerable diffuse metastases to the liver, largest 7.5 cm in segment 2, previously 7.4 cm. 7. New scattered subcentimeter hypodensities in the inferior spleen, possible metastases. 8. Progressive lytic metastatic disease throughout the spine, sacrum, bony pelvis, and proximal femurs. 9. Increased pathologic compression  fracture of the T3 and L5 vertebral body with soft tissue herniation into the spinal canal severely narrowing the spinal canal from the L4-5 to the L5-S1 level, and in the thoracic spine with increased soft tissue infiltration of the spinal canal at T4 and T8, and in the left T3-4 foramen. 10. Stable 2.3 cm enhancing mass in the right dorsal paraspinous musculature at L2-3. 11. Constipation. 12. Distal descending colon and sigmoid either demonstrating mild wall thickening or underdistention. Correlate clinically for underlying colitis. 13. Aortic and coronary artery atherosclerosis. Aortic Atherosclerosis (ICD10-I70.0) and Emphysema (ICD10-J43.9). Electronically Signed   By: Almira Bar M.D.   On: 12/28/2022 02:39   CT ABDOMEN PELVIS W CONTRAST  Result Date: 12/28/2022 CLINICAL DATA:  Patient with metastatic melanoma, new transaminitis, pulmonary embolism suspected. EXAM: CT ANGIOGRAPHY CHEST CT ABDOMEN AND PELVIS WITH CONTRAST TECHNIQUE: Multidetector CT imaging of the chest was performed using the standard protocol during bolus administration of intravenous contrast. Multiplanar CT image reconstructions and MIPs were obtained to evaluate the vascular anatomy. Multidetector CT imaging of the abdomen and pelvis was performed using the standard protocol during bolus administration of intravenous contrast. RADIATION DOSE REDUCTION: This exam was performed according to  the departmental dose-optimization program which includes automated exposure control, adjustment of the mA and/or kV according to patient size and/or use of iterative reconstruction technique. CONTRAST:  OMNIPAQUE IOHEXOL 350 MG/ML SOLN COMPARISON:  PET-CT recently 12/09/2022, and CTA chest, abdomen and pelvis 11/16/2022. FINDINGS: CTA CHEST FINDINGS Cardiovascular: The cardiac size is normal. No arterial dilatation or embolism is seen. Minimal pericardial effusion anteriorly. Scattered calcific plaques lad coronary artery. Mild aortic atherosclerosis. There is no aneurysm, stenosis or dissection. The great vessels are widely patent. Pulmonary veins are nondistended. Mediastinum/Nodes: Enlarged superior left hilar lymph node is 1.3 cm in short axis, previously 1.1 cm. No other intrathoracic adenopathy is seen. 1.2 cm hypodense nodule in the inferior pole of the left lobe of the thyroid gland is unchanged. No follow-up imaging is recommended. Lungs/Pleura: 1.5 cm pulmonary metastasis left upper lobe, level of the aortic arch, unchanged from the PET-CT, 11/16/2022 was 1.2 cm. There are several new nodules in the right middle lobe, most are 3 mm or smaller for example on series 13 axial images 80 6-99. Largest of these is 5 mm anteriorly on 13:90, concerning for new metastases. In the right lower lobe there is a new 5 mm nodule about the fissure on 13:90. Mild paraseptal emphysema again seen in the lung apices. There are coarse atelectatic bands in the posterior bases. On the left there is increased conspicuity of fissural nodules in the lower lung field, largest of these is about 3 mm. There is a new nodule in the left lower lobe measuring 5 mm on 13:99 and a new 3 mm apical left lung nodule on 13:44. There is no pleural effusion, no other new lung abnormality. No focal consolidation. Musculoskeletal: There is chronic interbody fusion hardware in the lower cervical spine. Multifocal lytic metastatic disease  particularly in the thoracic spine but also in the ribs and sternum, with pathologic compression fracture of the T3 vertebral body, worsening lytic metastasis T4 body with extension into the spinal canal and increased erosion through the posterior cortex, with increased soft tissue infiltration into the left T3-4 foramen. Similar increased posterior cortical erosion and metastatic extension into the spinal canal at T8. Mass effect on the cord could be present at these levels. Review of the MIP images confirms the above findings. CT  ABDOMEN and PELVIS FINDINGS Hepatobiliary: Innumerable diffuse metastases to the liver largest is in segment 2, today 7.5 cm, on the PET-CT 7.4 cm, the next largest is medially in segment 5 measuring 3.9 cm, previously 3.3 cm. Gallbladder is absent without bile duct dilatation. Pancreas: No abnormality. Spleen: New scattered nonspecific subcentimeter hypodensities are noted in the inferior aspect of the spleen, possible metastases. Largest is 8 mm on 19:24. Adrenals/Urinary Tract: Stable 1 cm left adrenal nodule, was not hypermetabolic on PET-CT. No right adrenal or broad or renal mass. No urinary stone or obstruction. Unremarkable bladder for the degree of distention. Stomach/Bowel: Unremarkable contracted stomach. Normal caliber unopacified small bowel. Normal appendix. Moderate fecal stasis ascending and transverse colon. Distal descending colon and sigmoid either demonstrating mild wall thickening or underdistention. Unremarkable rectum. Vascular/Lymphatic: Aortic atherosclerosis. No enlarged abdominal or pelvic lymph nodes. Reproductive: Status post hysterectomy. No adnexal masses. Multiple pelvic phleboliths. Other: No ascites or free air, no incarcerated hernia. Musculoskeletal: Progressive lytic metastatic disease, including compared with 12/09/2022, is noted at multiple levels of the lumbar spine, throughout the sacrum and bony pelvis, and with multiple small lytic lesions in  both proximal femurs. There is increased pathologic compression fracture of the L5 vertebral body with soft tissue herniation into the spinal canal severely narrowing the spinal canal from the L4-5 to the L5-S1 level. There is an enhancing mass in the right dorsal paraspinous musculature at L2-3 measuring 2.3 cm and was seen on 12/09/2022. Destructive mass of S3 to the left today measures 3 cm, previously 2.6 cm. Review of the MIP images confirms the above findings. IMPRESSION: 1. No evidence of arterial dilatation or embolism. 2. 1.5 cm pulmonary metastasis left upper lobe, unchanged from the PET-CT, 1.2 cm on 11/16/2022. 3. Multiple new nodules in the right middle lobe, right lower lobe, and left apical lung, largest 5 mm, concerning for new metastases. 4. Increased conspicuity of fissural nodules in the left lower lung field. 5. Increased size of a left hilar lymph node. 6. Innumerable diffuse metastases to the liver, largest 7.5 cm in segment 2, previously 7.4 cm. 7. New scattered subcentimeter hypodensities in the inferior spleen, possible metastases. 8. Progressive lytic metastatic disease throughout the spine, sacrum, bony pelvis, and proximal femurs. 9. Increased pathologic compression fracture of the T3 and L5 vertebral body with soft tissue herniation into the spinal canal severely narrowing the spinal canal from the L4-5 to the L5-S1 level, and in the thoracic spine with increased soft tissue infiltration of the spinal canal at T4 and T8, and in the left T3-4 foramen. 10. Stable 2.3 cm enhancing mass in the right dorsal paraspinous musculature at L2-3. 11. Constipation. 12. Distal descending colon and sigmoid either demonstrating mild wall thickening or underdistention. Correlate clinically for underlying colitis. 13. Aortic and coronary artery atherosclerosis. Aortic Atherosclerosis (ICD10-I70.0) and Emphysema (ICD10-J43.9). Electronically Signed   By: Almira Bar M.D.   On: 12/28/2022 02:39   CT  HEAD WO CONTRAST ( )  Result Date: 12/27/2022 CLINICAL DATA:  Delirium.  History of brain metastases. EXAM: CT HEAD WITHOUT CONTRAST TECHNIQUE: Contiguous axial images were obtained from the base of the skull through the vertex without intravenous contrast. RADIATION DOSE REDUCTION: This exam was performed according to the departmental dose-optimization program which includes automated exposure control, adjustment of the mA and/or kV according to patient size and/or use of iterative reconstruction technique. COMPARISON:  Head CT 11/11/2022.  MRI brain 12/17/2022. FINDINGS: Brain: No evidence of acute infarction, hemorrhage, hydrocephalus, or extra-axial fluid  collection. There are 2 rounded hyperdense metastatic lesions in the right frontal cortex seen best on coronal imaging. These are unchanged from prior MRI. There is also 2 mm hyperdense metastatic lesion in the right occipital region best seen on coronal image 5/46. This is not well appreciated on prior MRI. There is no mass effect. Minimal patchy periventricular white matter hypodensity appears similar to prior. Vascular: No hyperdense vessel or unexpected calcification. Skull: Normal. Negative for fracture or focal lesion. Sinuses/Orbits: No acute finding. Other: None. IMPRESSION: 1. No acute intracranial process. 2. Two stable rounded hyperdense metastatic lesions in the right frontal cortex. There is also a 2 mm hyperdense metastatic lesion in the right occipital region which is not well appreciated on prior MRI. No mass effect. Electronically Signed   By: Darliss Cheney M.D.   On: 12/27/2022 23:20   DG Chest Port 1 View  Result Date: 12/27/2022 CLINICAL DATA:  Questionable sepsis - evaluate for abnormality EXAM: PORTABLE CHEST 1 VIEW COMPARISON:  Chest x-ray 11/16/2022, CT angio chest 11/16/2022, PET CT 12/09/2022 FINDINGS: The heart and mediastinal contours are within normal limits. Aortic calcification. Redemonstration of an approximate 1.6 cm  left upper quadrant pulmonary better evaluated on PET-CT 12/09/2022. No focal consolidation. No pulmonary edema. No pleural effusion. No pneumothorax. No acute osseous abnormality.  Cervical spine surgical hardware. IMPRESSION: 1. No acute cardiopulmonary abnormality. 2. Redemonstration of an approximate 1.6 cm left upper quadrant pulmonary better evaluated on PET-CT 12/09/2022. 3.  Aortic Atherosclerosis (ICD10-I70.0). Electronically Signed   By: Tish Frederickson M.D.   On: 12/27/2022 23:05   MR BRAIN W WO CONTRAST  Addendum Date: 12/17/2022   ADDENDUM REPORT: 12/17/2022 13:41 ADDENDUM: Study discussed by telephone with [name] COURTNEY WHARTON on 12/17/2022 at 1332 hours. Electronically Signed   By: [place] Fleming M.D.   On: 12/17/2022 13:41   Result Date: 12/17/2022 CLINICAL DATA:  58 year old female with metastatic melanoma. Staging. Study under anesthesia. EXAM: MRI HEAD WITHOUT AND WITH CONTRAST TECHNIQUE: Multiplanar, multiecho pulse sequences of the brain and surrounding structures were obtained without and with intravenous contrast. CONTRAST:  5mL GADAVIST GADOBUTROL 1 MMOL/ML IV SOLN COMPARISON:  PET-CT 12/09/2022.  Head CT 11/11/2022. FINDINGS: Brain: Several small intrinsic T1 hyperintense (series 8, image 37) and/or enhancing (series 19, image 30) brain lesions compatible with small melanoma metastases. Only 1 of these demonstrates evidence of hemosiderin on SWI (series 7, image 72). No associated cerebral edema or mass effect. Three such small metastases are identified including 3-4 mm at the right inferior frontal gyrus on series 19, image 30, 3-4 mm at the right operculum image 35, and 2-3 mm at the right middle frontal gyrus on image 38. Although those lesions are relatively clustered, no additional brain metastases, abnormal enhancement, or abnormal susceptibility is identified elsewhere. No dural thickening. No superimposed restricted diffusion to suggest acute infarction. No midline shift,  ventriculomegaly, extra-axial collection or acute intracranial hemorrhage. Cervicomedullary junction and pituitary are within normal limits. Scattered nonenhancing white matter T2 and FLAIR hyperintensity is moderate for age, nonspecific but most commonly small vessel related. Vascular: Major intracranial vascular flow voids are preserved. Dominant distal left vertebral artery. Following contrast major dural venous sinuses are enhancing and appear to be patent. Skull and upper cervical spine: Heterogeneous background bone marrow signal. Known osseous metastatic disease, but no destructive lesion identified in the skull or visible cervical spine. Grossly negative visible cervical spinal cord. Sinuses/Orbits: Negative orbits. Minor paranasal sinus mucosal thickening. Other: Minimal left mastoid effusion. Visible internal auditory  structures appear normal. Intubated, study under anesthesia. Negative visible scalp and face. IMPRESSION: 1. Three small right hemisphere brain metastases metastases ranging from 2 mm to 4 mm. Minimal hemosiderin. No associated cerebral edema or mass effect. No other metastatic disease identified in the brain. 2. Known osseous metastatic disease but no destructive lesion identified in the skull or visible cervical spine. 3. Moderate for age cerebral white matter changes most commonly due to small vessel disease. Electronically Signed: By: Odessa Fleming M.D. On: 12/17/2022 13:16   MR LUMBAR SPINE W WO CONTRAST  Addendum Date: 12/17/2022   ADDENDUM REPORT: 12/17/2022 13:40 ADDENDUM: Study discussed by telephone with [name] COURTNEY WHARTON on 12/17/2022 at 1332 hours. And note that CLINICAL DATA should read 57 year old female with metastatic melanoma. Staging. Study under anesthesia. Electronically Signed   By: Odessa Fleming M.D.   On: 12/17/2022 13:40   Result Date: 12/17/2022 EXAM: MRI LUMBAR SPINE WITHOUT AND WITH CONTRAST TECHNIQUE: Multiplanar and multiecho pulse sequences of the lumbar spine  were obtained without and with intravenous contrast. CONTRAST:  5mL GADAVIST GADOBUTROL 1 MMOL/ML IV SOLN COMPARISON:  Brain MRI today reported separately. PET-CT 12/09/2022. CTA chest abdomen pelvis 11/16/2022. FINDINGS: Segmentation:  Normal on the comparison CTA. Alignment:  Mild straightening of lumbar lordosis now. Vertebrae: Diffuse skeletal metastatic disease throughout the visible spine and pelvis. Scout view demonstrates thoracic vertebral metastases with extension into the ventral epidural space at both the T4 and T8 levels (series 10, image 14). Pathologic fracture of the L5 vertebral body has progressed since 11/16/2022. L5 loss of height is now up to 60%, and there is retropulsed and/or epidural extension of tumor at that level (series 16, image 31), see additional spinal canal details below. Lumbar ventral epidural tumor also at the left L1 vertebral level without spinal stenosis (series 15, image 17), and at the L4 level on the right (series 15, image 25) without spinal stenosis. Incidental bulky extraosseous tumor also from the right L1 transverse process, 2.2 cm right paraspinal muscle mass there on series 15, image 7. No other pathologic fracture identified. Conus medullaris and cauda equina: Conus extends to the L1 level. No lower spinal cord or conus signal abnormality. But there is an indistinct appearance of the surface of the lower thoracic spinal cord on T2 imaging (series 12, image 8), and indeterminate enhancement pattern at the conus (series 17, image 7). No obvious nerve root thickening or enhancement in the cauda equina. Paraspinal and other soft tissues: Right L1 paraspinal muscle tumor arising from the posterior elements at that level as above. Partially visible extensive liver tumor. No retroperitoneal, prevertebral lymphadenopathy. Disc levels: No significant lumbar spine degeneration superimposed on the diffuse metastatic disease. Ventral epidural tumor at L2 and L4 without spinal  stenosis as above. Bulky tumor retropulsion, epidural extension at the L5 pathologic compression fracture level now resulting in severe spinal stenosis (series 15, image 31 and series 12, image 8), severe lateral recess stenosis (S1 nerve levels) and severe proximal L5 foraminal stenosis. IMPRESSION: 1. Diffuse skeletal metastatic disease. Involvement throughout the visible spine and pelvis. 2. Progressed pathologic fracture of the L5 since 10/21/2024with 60% loss of height and bulky retropulsed/epidural tumor. 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 3. 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 And scout view demonstrates thoracic spine ventral epidural tumor at both T4 and T8 levels. 4. 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 Electronically Signed: By: Althea Grimmer.D.  On: 12/17/2022 13:28   NM PET Image Initial (PI) Whole Body  Result Date: 12/09/2022 CLINICAL DATA:  Initial treatment strategy for metastatic melanoma. EXAM: NUCLEAR MEDICINE PET WHOLE BODY TECHNIQUE: 6.6 mCi F-18 FDG was injected intravenously. Full-ring PET imaging was performed from the head to foot after the radiotracer. CT data was obtained and used for attenuation correction and anatomic localization. Fasting blood glucose: 115 mg/dl COMPARISON:  CTA chest abdomen pelvis dated 11/16/2022 FINDINGS: Mediastinal blood pool activity: SUV max 2.1 HEAD/NECK: No hypermetabolic activity in the scalp. No hypermetabolic cervical lymph nodes. Incidental CT findings: none CHEST: 14 mm posterior left upper lobe pulmonary nodule (series 7/image 20), max SUV 8.7. 11 mm short axis left AP window node (series 4/image 82), max SUV 11.2. Incidental CT findings: Mild atherosclerotic calcifications of the aortic arch. Mild coronary atherosclerosis of the LAD. Mild centrilobular and paraseptal emphysematous changes, upper lung predominant. ABDOMEN/PELVIS: Multifocal  hepatic metastases, approximately 10-15 in number, including: --7.4 cm mass in the lateral segment left hepatic lobe (series 4/image 112), max SUV 16.3 --2.9 cm mass in the medial aspect of segment 6 (series 4/image 122), max SUV 14.7 No abnormal hypermetabolism in the pancreas, spleen, or adrenal glands. No hypermetabolic abdominopelvic lymphadenopathy. Incidental CT findings: Status post cholecystectomy. Status post hysterectomy. Mild rectosigmoid colonic wall thickening (series 4/image 135), nonspecific. Atherosclerotic calcifications of the abdominal aorta and branch vessels. SKELETON: Multifocal/widespread osseous metastases, including: --Left humeral head, max SUV 6.4 --T4 vertebral body, max SUV 21.1 --Right lateral 6th rib, max SUV 4.2 --Right inferior sternum, max SUV 12.5 --L4 vertebral body, max SUV 16.3 --Left sacrum, max SUV 15.4 Hypermetabolism involving right anterior thigh musculature, favored to be physiologic. Incidental CT findings: none EXTREMITIES: No abnormal hypermetabolic activity in the lower extremities. Incidental CT findings: none IMPRESSION: Left upper lobe pulmonary metastasis, as above. Associated mediastinal nodal metastasis. Multifocal hepatic metastases, as above. Multifocal/widespread osseous metastases, as above. Electronically Signed   By: Charline Bills M.D.   On: 12/09/2022 23:34

## 2022-12-31 ENCOUNTER — Other Ambulatory Visit (HOSPITAL_COMMUNITY): Payer: Self-pay

## 2022-12-31 ENCOUNTER — Other Ambulatory Visit: Payer: Self-pay

## 2022-12-31 ENCOUNTER — Encounter: Payer: Self-pay | Admitting: Nurse Practitioner

## 2022-12-31 ENCOUNTER — Inpatient Hospital Stay (HOSPITAL_BASED_OUTPATIENT_CLINIC_OR_DEPARTMENT_OTHER): Payer: BC Managed Care – PPO

## 2022-12-31 ENCOUNTER — Inpatient Hospital Stay: Payer: BC Managed Care – PPO

## 2022-12-31 ENCOUNTER — Inpatient Hospital Stay: Payer: BC Managed Care – PPO | Attending: Physician Assistant

## 2022-12-31 ENCOUNTER — Inpatient Hospital Stay (HOSPITAL_BASED_OUTPATIENT_CLINIC_OR_DEPARTMENT_OTHER): Payer: BC Managed Care – PPO | Admitting: Nurse Practitioner

## 2022-12-31 VITALS — BP 121/74 | HR 110 | Temp 99.1°F | Resp 14

## 2022-12-31 VITALS — BP 107/70 | HR 133 | Temp 97.2°F | Resp 18 | Wt 132.0 lb

## 2022-12-31 DIAGNOSIS — R0602 Shortness of breath: Secondary | ICD-10-CM | POA: Diagnosis not present

## 2022-12-31 DIAGNOSIS — R630 Anorexia: Secondary | ICD-10-CM | POA: Diagnosis not present

## 2022-12-31 DIAGNOSIS — R530 Neoplastic (malignant) related fatigue: Secondary | ICD-10-CM

## 2022-12-31 DIAGNOSIS — C7951 Secondary malignant neoplasm of bone: Secondary | ICD-10-CM

## 2022-12-31 DIAGNOSIS — Z79620 Long term (current) use of immunosuppressive biologic: Secondary | ICD-10-CM | POA: Insufficient documentation

## 2022-12-31 DIAGNOSIS — C7931 Secondary malignant neoplasm of brain: Secondary | ICD-10-CM

## 2022-12-31 DIAGNOSIS — G893 Neoplasm related pain (acute) (chronic): Secondary | ICD-10-CM

## 2022-12-31 DIAGNOSIS — C787 Secondary malignant neoplasm of liver and intrahepatic bile duct: Secondary | ICD-10-CM | POA: Insufficient documentation

## 2022-12-31 DIAGNOSIS — C771 Secondary and unspecified malignant neoplasm of intrathoracic lymph nodes: Secondary | ICD-10-CM | POA: Diagnosis not present

## 2022-12-31 DIAGNOSIS — C439 Malignant melanoma of skin, unspecified: Secondary | ICD-10-CM

## 2022-12-31 DIAGNOSIS — Z5112 Encounter for antineoplastic immunotherapy: Secondary | ICD-10-CM | POA: Insufficient documentation

## 2022-12-31 DIAGNOSIS — C7802 Secondary malignant neoplasm of left lung: Secondary | ICD-10-CM | POA: Diagnosis not present

## 2022-12-31 DIAGNOSIS — C4359 Malignant melanoma of other part of trunk: Secondary | ICD-10-CM | POA: Insufficient documentation

## 2022-12-31 DIAGNOSIS — E876 Hypokalemia: Secondary | ICD-10-CM

## 2022-12-31 DIAGNOSIS — Z515 Encounter for palliative care: Secondary | ICD-10-CM | POA: Insufficient documentation

## 2022-12-31 DIAGNOSIS — C7801 Secondary malignant neoplasm of right lung: Secondary | ICD-10-CM | POA: Insufficient documentation

## 2022-12-31 LAB — CMP (CANCER CENTER ONLY)
ALT: 40 U/L (ref 0–44)
AST: 102 U/L — ABNORMAL HIGH (ref 15–41)
Albumin: 3.1 g/dL — ABNORMAL LOW (ref 3.5–5.0)
Alkaline Phosphatase: 384 U/L — ABNORMAL HIGH (ref 38–126)
Anion gap: 5 (ref 5–15)
BUN: 14 mg/dL (ref 6–20)
CO2: 34 mmol/L — ABNORMAL HIGH (ref 22–32)
Calcium: 10.3 mg/dL (ref 8.9–10.3)
Chloride: 97 mmol/L — ABNORMAL LOW (ref 98–111)
Creatinine: 1.09 mg/dL — ABNORMAL HIGH (ref 0.44–1.00)
GFR, Estimated: >60 mL/min (ref >60)
Glucose, Bld: 107 mg/dL — ABNORMAL HIGH (ref 70–99)
Potassium: 3.4 mmol/L — ABNORMAL LOW (ref 3.5–5.1)
Sodium: 136 mmol/L (ref 135–145)
Total Bilirubin: 1.1 mg/dL (ref <1.2)
Total Protein: 6.2 g/dL — ABNORMAL LOW (ref 6.5–8.1)

## 2022-12-31 LAB — CBC WITH DIFFERENTIAL (CANCER CENTER ONLY)
Abs Immature Granulocytes: 0.62 10*3/uL — ABNORMAL HIGH (ref 0.00–0.07)
Basophils Absolute: 0.1 10*3/uL (ref 0.0–0.1)
Basophils Relative: 1 %
Eosinophils Absolute: 0.1 10*3/uL (ref 0.0–0.5)
Eosinophils Relative: 1 %
HCT: 32.3 % — ABNORMAL LOW (ref 36.0–46.0)
Hemoglobin: 10.7 g/dL — ABNORMAL LOW (ref 12.0–15.0)
Immature Granulocytes: 8 %
Lymphocytes Relative: 7 %
Lymphs Abs: 0.5 10*3/uL — ABNORMAL LOW (ref 0.7–4.0)
MCH: 31.1 pg (ref 26.0–34.0)
MCHC: 33.1 g/dL (ref 30.0–36.0)
MCV: 93.9 fL (ref 80.0–100.0)
Monocytes Absolute: 0.3 10*3/uL (ref 0.1–1.0)
Monocytes Relative: 4 %
Neutro Abs: 6.2 10*3/uL (ref 1.7–7.7)
Neutrophils Relative %: 79 %
Platelet Count: 301 10*3/uL (ref 150–400)
RBC: 3.44 MIL/uL — ABNORMAL LOW (ref 3.87–5.11)
RDW: 13.6 % (ref 11.5–15.5)
WBC Count: 7.8 10*3/uL (ref 4.0–10.5)
nRBC: 0.6 % — ABNORMAL HIGH (ref 0.0–0.2)

## 2022-12-31 LAB — LACTATE DEHYDROGENASE: LDH: 2793 U/L — ABNORMAL HIGH (ref 98–192)

## 2022-12-31 MED ORDER — SODIUM CHLORIDE 0.9 % IV SOLN: INTRAVENOUS | Status: DC

## 2022-12-31 MED ORDER — DIPHENHYDRAMINE HCL 50 MG/ML IJ SOLN
25.0000 mg | Freq: Once | INTRAMUSCULAR | Status: AC
  Administered 2022-12-31: 25 mg via INTRAVENOUS
  Filled 2022-12-31: qty 1

## 2022-12-31 MED ORDER — HYDROMORPHONE HCL 1 MG/ML IJ SOLN
0.5000 mg | INTRAMUSCULAR | Status: AC
Start: 2022-12-31 — End: 2022-12-31
  Administered 2022-12-31 (×2): 0.5 mg via INTRAVENOUS
  Filled 2022-12-31: qty 1

## 2022-12-31 MED ORDER — SODIUM CHLORIDE 0.9 % IV SOLN
3.0000 mg/kg | Freq: Once | INTRAVENOUS | Status: AC
  Administered 2022-12-31: 200 mg via INTRAVENOUS
  Filled 2022-12-31: qty 40

## 2022-12-31 MED ORDER — HYDROMORPHONE HCL 2 MG PO TABS
2.0000 mg | ORAL_TABLET | Freq: Four times a day (QID) | ORAL | 0 refills | Status: DC | PRN
  Filled 2022-12-31: qty 60, 15d supply, fill #0

## 2022-12-31 MED ORDER — SODIUM CHLORIDE 0.9% FLUSH: 10.0000 mL | INTRAVENOUS | Status: DC | PRN

## 2022-12-31 MED ORDER — SODIUM CHLORIDE 0.9 % IV SOLN
1.0000 mg/kg | Freq: Once | INTRAVENOUS | Status: AC
  Administered 2022-12-31: 61 mg via INTRAVENOUS
  Filled 2022-12-31: qty 6.1

## 2022-12-31 MED ORDER — FAMOTIDINE IN NACL 20-0.9 MG/50ML-% IV SOLN
20.0000 mg | Freq: Once | INTRAVENOUS | Status: AC
  Administered 2022-12-31: 20 mg via INTRAVENOUS
  Filled 2022-12-31: qty 50

## 2022-12-31 MED ORDER — HEPARIN SOD (PORK) LOCK FLUSH 100 UNIT/ML IV SOLN
500.0000 [IU] | Freq: Once | INTRAVENOUS | Status: DC | PRN
Start: 2022-12-31 — End: 2022-12-31

## 2022-12-31 NOTE — Patient Instructions (Addendum)
[facility] - A DEPT OF [facility]  Discharge Instructions:  To file a grievance about the heel injury: [email or web address]   Thank you for choosing [facility] to provide your oncology and hematology care.   If you have a lab appointment with the Cancer Center, please go directly to the Cancer Center and check in at the registration area.   Wear comfortable clothing and clothing appropriate for easy access to any Portacath or PICC line.   We strive to give you quality time with your provider. You may need to reschedule your appointment if you arrive late (15 or more minutes).  Arriving late affects you and other patients whose appointments are after yours.  Also, if you miss three or more appointments without notifying the office, you may be dismissed from the clinic at the provider's discretion.      For prescription refill requests, have your pharmacy contact our office and allow 72 hours for refills to be completed.    Today you received the following chemotherapy and/or immunotherapy agents: nivolumab, ipilimumab      To help prevent nausea and vomiting after your treatment, we encourage you to take your nausea medication as directed.  BELOW ARE SYMPTOMS THAT SHOULD BE REPORTED IMMEDIATELY: *FEVER GREATER THAN 100.4 F (38 C) OR HIGHER *CHILLS OR SWEATING *NAUSEA AND VOMITING THAT IS NOT CONTROLLED WITH YOUR NAUSEA MEDICATION *UNUSUAL SHORTNESS OF BREATH *UNUSUAL BRUISING OR BLEEDING *URINARY PROBLEMS (pain or burning when urinating, or frequent urination) *BOWEL PROBLEMS (unusual diarrhea, constipation, pain near the anus) TENDERNESS IN MOUTH AND THROAT WITH OR WITHOUT PRESENCE OF ULCERS (sore throat, sores in mouth, or a toothache) UNUSUAL RASH, SWELLING OR PAIN  UNUSUAL VAGINAL DISCHARGE OR ITCHING   Items with * indicate a potential emergency and should be followed up as soon as possible or go to the Emergency Department if  any problems should occur.  Please show the CHEMOTHERAPY ALERT CARD or IMMUNOTHERAPY ALERT CARD at check-in to the Emergency Department and triage nurse.  Should you have questions after your visit or need to cancel or reschedule your appointment, please contact [facility] - A DE[facility]  Dept: [phone]  and follow the prompts.  Office hours are 8:00 a.m. to 4:30 p.m. Monday - Friday. Please note that voicemails left after 4:00 p.m. may not be returned until the following business day.  We are closed weekends and major holidays. You have access to a nurse at all times for urgent questions. Please call the main number to the clinic Dept: [phone] and follow the prompts.   For any non-urgent questions, you may also contact your provider using MyChart. We now offer e-Visits for anyone 49 and older to request care online for non-urgent symptoms. For details visit [email or web address].   Also download the MyChart app! Go to the app store, search "MyChart", open the app, select [facility], and log in with your MyChart username and password.

## 2022-12-31 NOTE — Progress Notes (Signed)
Per Dr. Cherly Hensen- ok to treat today with heart rate 115-125 and AST of 102.  Ipilimumab (YERVOY) Patient Monitoring Assessment   Is the patient experiencing any of the following general symptoms?:  [] Difficulty performing normal activities [] Feeling sluggish or cold all the time [] Unusual weight gain [] Constant or unusual headaches [] Feeling dizzy or faint [] Changes in eyesight (blurry vision, double vision, or other vision problems) [] Changes in mood or behavior (ex: decreased sex drive, irritability, or forgetfulness) [] Starting new medications (ex: steroids, other medications that lower immune response) [x] Patient is not experiencing any of the general symptoms above.    Gastrointestinal  Patient is having 2 bowel movements each day.  Is this different from baseline? [] Yes [x] No Are your stools watery or do they have a foul smell? [] Yes [x] No Have you seen blood in your stools? [] Yes [x] No Are your stools dark, tarry, or sticky? [] Yes [x] No Are you having pain or tenderness in your belly? [] Yes [x] No  Skin Does your skin itch? [] Yes [x] No Do you have a rash? [] Yes [x] No Has your skin blistered and/or peeled? [] Yes [x] No Do you have sores in your mouth? [] Yes [x] No  Hepatic Has your urine been dark or tea colored? [] Yes [x] No Have you noticed that your skin or the whites of your eyes are turning yellow? [] Yes [x] No Are you bleeding or bruising more easily than normal? [] Yes [x] No Are you nauseous and/or vomiting? [] Yes [x] No Do you have pain on the right side of your stomach? [] Yes [x] No  Neurologic  Are you having unusual weakness of legs, arms, or face? [] Yes [x] No Are you having numbness or tingling in your hands or feet? [] Yes [x] No  Laura [name]   Patient having a lot of pain in her back today. Given 2X doses of 0.5mg  of dilaudid. Patient heel/ankle area is also causing her pain.

## 2022-12-31 NOTE — Assessment & Plan Note (Addendum)
Currently asymptomatic. Response with ipi/nivo is more than 50%.  Will continue monitor for new symptoms. If asymptomatic, repeat MRI in about 2 months

## 2022-12-31 NOTE — Assessment & Plan Note (Signed)
Increase potassium rich food

## 2023-01-02 LAB — CULTURE, BLOOD (ROUTINE X 2)
Culture: NO GROWTH
Culture: NO GROWTH
Special Requests: ADEQUATE

## 2023-01-03 ENCOUNTER — Other Ambulatory Visit: Payer: Self-pay

## 2023-01-03 ENCOUNTER — Inpatient Hospital Stay (HOSPITAL_COMMUNITY)
Admission: EM | Admit: 2023-01-03 | Discharge: 2023-01-05 | DRG: 092 | Disposition: A | Discharge status: Home / Self Care | Payer: BC Managed Care – PPO | Attending: Internal Medicine | Admitting: Internal Medicine

## 2023-01-03 ENCOUNTER — Emergency Department (HOSPITAL_COMMUNITY): Payer: BC Managed Care – PPO

## 2023-01-03 DIAGNOSIS — R630 Anorexia: Secondary | ICD-10-CM

## 2023-01-03 DIAGNOSIS — C439 Malignant melanoma of skin, unspecified: Principal | ICD-10-CM | POA: Diagnosis present

## 2023-01-03 DIAGNOSIS — R000 Tachycardia, unspecified: Secondary | ICD-10-CM | POA: Diagnosis present

## 2023-01-03 DIAGNOSIS — T402X5A Adverse effect of other opioids, initial encounter: Secondary | ICD-10-CM | POA: Diagnosis present

## 2023-01-03 DIAGNOSIS — T426X5A Adverse effect of other antiepileptic and sedative-hypnotic drugs, initial encounter: Secondary | ICD-10-CM | POA: Diagnosis present

## 2023-01-03 DIAGNOSIS — G893 Neoplasm related pain (acute) (chronic): Secondary | ICD-10-CM | POA: Diagnosis not present

## 2023-01-03 DIAGNOSIS — Z79899 Other long term (current) drug therapy: Secondary | ICD-10-CM | POA: Diagnosis not present

## 2023-01-03 DIAGNOSIS — M797 Fibromyalgia: Secondary | ICD-10-CM | POA: Diagnosis not present

## 2023-01-03 DIAGNOSIS — E872 Acidosis, unspecified: Secondary | ICD-10-CM | POA: Diagnosis present

## 2023-01-03 DIAGNOSIS — T451X5A Adverse effect of antineoplastic and immunosuppressive drugs, initial encounter: Secondary | ICD-10-CM | POA: Insufficient documentation

## 2023-01-03 DIAGNOSIS — D6481 Anemia due to antineoplastic chemotherapy: Secondary | ICD-10-CM | POA: Diagnosis not present

## 2023-01-03 DIAGNOSIS — N179 Acute kidney failure, unspecified: Secondary | ICD-10-CM | POA: Diagnosis not present

## 2023-01-03 DIAGNOSIS — C7951 Secondary malignant neoplasm of bone: Secondary | ICD-10-CM | POA: Diagnosis not present

## 2023-01-03 DIAGNOSIS — E871 Hypo-osmolality and hyponatremia: Secondary | ICD-10-CM | POA: Diagnosis present

## 2023-01-03 DIAGNOSIS — E861 Hypovolemia: Secondary | ICD-10-CM | POA: Diagnosis not present

## 2023-01-03 DIAGNOSIS — Z803 Family history of malignant neoplasm of breast: Secondary | ICD-10-CM

## 2023-01-03 DIAGNOSIS — C787 Secondary malignant neoplasm of liver and intrahepatic bile duct: Secondary | ICD-10-CM | POA: Diagnosis not present

## 2023-01-03 DIAGNOSIS — Z85820 Personal history of malignant melanoma of skin: Secondary | ICD-10-CM

## 2023-01-03 DIAGNOSIS — C7931 Secondary malignant neoplasm of brain: Secondary | ICD-10-CM | POA: Diagnosis not present

## 2023-01-03 DIAGNOSIS — Z886 Allergy status to analgesic agent status: Secondary | ICD-10-CM

## 2023-01-03 DIAGNOSIS — R911 Solitary pulmonary nodule: Secondary | ICD-10-CM | POA: Diagnosis not present

## 2023-01-03 DIAGNOSIS — N289 Disorder of kidney and ureter, unspecified: Secondary | ICD-10-CM | POA: Diagnosis not present

## 2023-01-03 DIAGNOSIS — G9341 Metabolic encephalopathy: Secondary | ICD-10-CM | POA: Diagnosis present

## 2023-01-03 DIAGNOSIS — F17290 Nicotine dependence, other tobacco product, uncomplicated: Secondary | ICD-10-CM | POA: Diagnosis present

## 2023-01-03 DIAGNOSIS — T481X5A Adverse effect of skeletal muscle relaxants [neuromuscular blocking agents], initial encounter: Secondary | ICD-10-CM | POA: Diagnosis present

## 2023-01-03 DIAGNOSIS — J90 Pleural effusion, not elsewhere classified: Secondary | ICD-10-CM | POA: Diagnosis not present

## 2023-01-03 DIAGNOSIS — E860 Dehydration: Secondary | ICD-10-CM

## 2023-01-03 DIAGNOSIS — E7800 Pure hypercholesterolemia, unspecified: Secondary | ICD-10-CM | POA: Diagnosis present

## 2023-01-03 DIAGNOSIS — Z79891 Long term (current) use of opiate analgesic: Secondary | ICD-10-CM | POA: Diagnosis not present

## 2023-01-03 DIAGNOSIS — G9340 Encephalopathy, unspecified: Secondary | ICD-10-CM | POA: Diagnosis not present

## 2023-01-03 DIAGNOSIS — A419 Sepsis, unspecified organism: Secondary | ICD-10-CM | POA: Diagnosis not present

## 2023-01-03 DIAGNOSIS — Z9049 Acquired absence of other specified parts of digestive tract: Secondary | ICD-10-CM

## 2023-01-03 DIAGNOSIS — G928 Other toxic encephalopathy: Secondary | ICD-10-CM | POA: Diagnosis not present

## 2023-01-03 DIAGNOSIS — Z90710 Acquired absence of both cervix and uterus: Secondary | ICD-10-CM

## 2023-01-03 DIAGNOSIS — Z801 Family history of malignant neoplasm of trachea, bronchus and lung: Secondary | ICD-10-CM

## 2023-01-03 LAB — COMPREHENSIVE METABOLIC PANEL
ALT: 63 U/L — ABNORMAL HIGH (ref 0–44)
AST: 163 U/L — ABNORMAL HIGH (ref 15–41)
Albumin: 2.2 g/dL — ABNORMAL LOW (ref 3.5–5.0)
Alkaline Phosphatase: 444 U/L — ABNORMAL HIGH (ref 38–126)
Anion gap: 9 (ref 5–15)
BUN: 14 mg/dL (ref 6–20)
CO2: 26 mmol/L (ref 22–32)
Calcium: 9.2 mg/dL (ref 8.9–10.3)
Chloride: 99 mmol/L (ref 98–111)
Creatinine, Ser: 1.08 mg/dL — ABNORMAL HIGH (ref 0.44–1.00)
GFR, Estimated: >60 mL/min (ref >60)
Glucose, Bld: 95 mg/dL (ref 70–99)
Potassium: 4.6 mmol/L (ref 3.5–5.1)
Sodium: 134 mmol/L — ABNORMAL LOW (ref 135–145)
Total Bilirubin: 1.8 mg/dL — ABNORMAL HIGH (ref <1.2)
Total Protein: 5.5 g/dL — ABNORMAL LOW (ref 6.5–8.1)

## 2023-01-03 LAB — URINALYSIS, W/ REFLEX TO CULTURE (INFECTION SUSPECTED)
Glucose, UA: NEGATIVE mg/dL
Hgb urine dipstick: NEGATIVE
Ketones, ur: 5 mg/dL — AB
Leukocytes,Ua: NEGATIVE
Nitrite: NEGATIVE
Protein, ur: 30 mg/dL — AB
Specific Gravity, Urine: 1.023 (ref 1.005–1.030)
pH: 5 (ref 5.0–8.0)

## 2023-01-03 LAB — CBC WITH DIFFERENTIAL/PLATELET
Abs Immature Granulocytes: 0.1 10*3/uL — ABNORMAL HIGH (ref 0.00–0.07)
Basophils Absolute: 0 10*3/uL (ref 0.0–0.1)
Basophils Relative: 0 %
Eosinophils Absolute: 0.2 10*3/uL (ref 0.0–0.5)
Eosinophils Relative: 3 %
HCT: 31.6 % — ABNORMAL LOW (ref 36.0–46.0)
Hemoglobin: 10.5 g/dL — ABNORMAL LOW (ref 12.0–15.0)
Lymphocytes Relative: 6 %
Lymphs Abs: 0.4 10*3/uL — ABNORMAL LOW (ref 0.7–4.0)
MCH: 31.8 pg (ref 26.0–34.0)
MCHC: 33.2 g/dL (ref 30.0–36.0)
MCV: 95.8 fL (ref 80.0–100.0)
Monocytes Absolute: 0.8 10*3/uL (ref 0.1–1.0)
Monocytes Relative: 11 %
Myelocytes: 2 %
Neutro Abs: 5.6 10*3/uL (ref 1.7–7.7)
Neutrophils Relative %: 78 %
Platelets: 256 10*3/uL (ref 150–400)
RBC: 3.3 MIL/uL — ABNORMAL LOW (ref 3.87–5.11)
RDW: 13.9 % (ref 11.5–15.5)
WBC: 7.2 10*3/uL (ref 4.0–10.5)
nRBC: 0.6 % — ABNORMAL HIGH (ref 0.0–0.2)

## 2023-01-03 LAB — I-STAT CG4 LACTIC ACID, ED
Lactic Acid, Venous: 2.2 mmol/L (ref 0.5–1.9)
Lactic Acid, Venous: 1.7 mmol/L (ref 0.5–1.9)

## 2023-01-03 LAB — T4, FREE: Free T4: 1.23 ng/dL — ABNORMAL HIGH (ref 0.61–1.12)

## 2023-01-03 LAB — PROTIME-INR
INR: 1.3 — ABNORMAL HIGH (ref 0.8–1.2)
Prothrombin Time: 16 s — ABNORMAL HIGH (ref 11.4–15.2)

## 2023-01-03 LAB — URIC ACID: Uric Acid, Serum: 3.7 mg/dL (ref 2.5–7.1)

## 2023-01-03 LAB — TSH: TSH: 1.3 u[IU]/mL (ref 0.350–4.500)

## 2023-01-03 MED ORDER — ONDANSETRON HCL 4 MG PO TABS: 4.0000 mg | ORAL_TABLET | Freq: Four times a day (QID) | ORAL | Status: DC | PRN

## 2023-01-03 MED ORDER — POTASSIUM CHLORIDE CRYS ER 20 MEQ PO TBCR
20.0000 meq | EXTENDED_RELEASE_TABLET | Freq: Two times a day (BID) | ORAL | Status: DC
  Administered 2023-01-03 – 2023-01-05 (×4): 20 meq via ORAL
  Filled 2023-01-03 (×4): qty 1

## 2023-01-03 MED ORDER — PREGABALIN 75 MG PO CAPS
75.0000 mg | ORAL_CAPSULE | Freq: Three times a day (TID) | ORAL | Status: DC
  Administered 2023-01-03: 75 mg via ORAL
  Filled 2023-01-03 (×2): qty 1

## 2023-01-03 MED ORDER — PANTOPRAZOLE SODIUM 40 MG PO TBEC
40.0000 mg | DELAYED_RELEASE_TABLET | Freq: Every day | ORAL | Status: DC
  Administered 2023-01-04 – 2023-01-05 (×2): 40 mg via ORAL
  Filled 2023-01-03 (×2): qty 1

## 2023-01-03 MED ORDER — DRONABINOL 2.5 MG PO CAPS: 2.5000 mg | ORAL_CAPSULE | ORAL | Status: DC

## 2023-01-03 MED ORDER — ROSUVASTATIN CALCIUM 10 MG PO TABS
10.0000 mg | ORAL_TABLET | Freq: Every day | ORAL | Status: DC
  Administered 2023-01-03 – 2023-01-04 (×2): 10 mg via ORAL
  Filled 2023-01-03 (×2): qty 1

## 2023-01-03 MED ORDER — DRONABINOL 2.5 MG PO CAPS
2.5000 mg | ORAL_CAPSULE | Freq: Every day | ORAL | Status: DC
  Administered 2023-01-04: 2.5 mg via ORAL
  Filled 2023-01-03: qty 1

## 2023-01-03 MED ORDER — ONDANSETRON HCL 4 MG/2ML IJ SOLN
4.0000 mg | Freq: Once | INTRAMUSCULAR | Status: DC
  Filled 2023-01-03: qty 2

## 2023-01-03 MED ORDER — SODIUM CHLORIDE 0.9 % IV BOLUS
2000.0000 mL | Freq: Once | INTRAVENOUS | Status: AC
  Administered 2023-01-03: 2000 mL via INTRAVENOUS

## 2023-01-03 MED ORDER — SODIUM CHLORIDE 0.9 % IV SOLN: INTRAVENOUS | Status: DC

## 2023-01-03 MED ORDER — HYDROMORPHONE HCL 2 MG PO TABS
2.0000 mg | ORAL_TABLET | Freq: Four times a day (QID) | ORAL | Status: DC | PRN
  Administered 2023-01-04 – 2023-01-05 (×3): 2 mg via ORAL
  Filled 2023-01-03 (×3): qty 1

## 2023-01-03 MED ORDER — MORPHINE SULFATE ER 15 MG PO TBCR
15.0000 mg | EXTENDED_RELEASE_TABLET | Freq: Two times a day (BID) | ORAL | Status: DC
  Filled 2023-01-03: qty 1

## 2023-01-03 MED ORDER — ACETAMINOPHEN 325 MG PO TABS: 650.0000 mg | ORAL_TABLET | Freq: Four times a day (QID) | ORAL | Status: DC | PRN

## 2023-01-03 MED ORDER — ZOLPIDEM TARTRATE 5 MG PO TABS
5.0000 mg | ORAL_TABLET | Freq: Every day | ORAL | Status: DC
  Administered 2023-01-03 – 2023-01-04 (×2): 5 mg via ORAL
  Filled 2023-01-03 (×2): qty 1

## 2023-01-03 MED ORDER — SENNOSIDES-DOCUSATE SODIUM 8.6-50 MG PO TABS
1.0000 | ORAL_TABLET | Freq: Two times a day (BID) | ORAL | Status: DC
  Administered 2023-01-03 – 2023-01-05 (×4): 1 via ORAL
  Filled 2023-01-03 (×4): qty 1

## 2023-01-03 MED ORDER — ONDANSETRON HCL 4 MG/2ML IJ SOLN
4.0000 mg | Freq: Four times a day (QID) | INTRAMUSCULAR | Status: DC | PRN
  Administered 2023-01-05: 4 mg via INTRAVENOUS
  Filled 2023-01-03 (×2): qty 2

## 2023-01-03 MED ORDER — BUSPIRONE HCL 5 MG PO TABS
15.0000 mg | ORAL_TABLET | Freq: Two times a day (BID) | ORAL | Status: DC
  Administered 2023-01-03 – 2023-01-05 (×4): 15 mg via ORAL
  Filled 2023-01-03 (×4): qty 3

## 2023-01-03 MED ORDER — FAMOTIDINE-CA CARB-MAG HYDROX 10-800-165 MG PO CHEW: 1.0000 | CHEWABLE_TABLET | Freq: Every morning | ORAL | Status: DC

## 2023-01-03 MED ORDER — FAMOTIDINE 20 MG PO TABS
20.0000 mg | ORAL_TABLET | Freq: Every day | ORAL | Status: DC
  Administered 2023-01-04 – 2023-01-05 (×2): 20 mg via ORAL
  Filled 2023-01-03 (×2): qty 1

## 2023-01-03 MED ORDER — ACETAMINOPHEN 650 MG RE SUPP: 650.0000 mg | Freq: Four times a day (QID) | RECTAL | Status: DC | PRN

## 2023-01-03 MED ORDER — LACTATED RINGERS IV SOLN: INTRAVENOUS | Status: DC

## 2023-01-03 MED ORDER — DRONABINOL 2.5 MG PO CAPS
5.0000 mg | ORAL_CAPSULE | Freq: Every day | ORAL | Status: DC
  Administered 2023-01-04 – 2023-01-05 (×2): 5 mg via ORAL
  Filled 2023-01-03 (×2): qty 2

## 2023-01-03 MED ORDER — ENOXAPARIN SODIUM 40 MG/0.4ML IJ SOSY
40.0000 mg | PREFILLED_SYRINGE | INTRAMUSCULAR | Status: DC
  Administered 2023-01-03 – 2023-01-04 (×2): 40 mg via SUBCUTANEOUS
  Filled 2023-01-03 (×2): qty 0.4

## 2023-01-03 NOTE — Assessment & Plan Note (Signed)
Pt just got immunotherapy on 12/5. D/w Dr. Pamelia Hoit: "I think immune mediated Adverse effect is less likely here. Nothing here is classic (examples like hypotension from adrenal insufficiency, tachycardia from hyperthyroidism , diarrhea from colitis. Skin rash etc) it might be dehydration more than immunotherapy related. Hydrate and observe would be my suggestion. Unless you repeat TSH and thyroid studies and they show hyperthyroidism" TSH nl again Checking cortisol Putting pt on IVF

## 2023-01-03 NOTE — ED Provider Notes (Signed)
[place] EMERGENCY DEPARTMENT AT [facility] Provider Note   CSN: [id number] Arrival date & time: 01/03/23  1517     History {Add pertinent medical, surgical, social history, OB history to HPI:1} Chief Complaint  Patient presents with   Fatigue    Laura [name] is a 57 y.o. female.  HPI   Patient has a history of migraines gallstones renal insufficiency and malignant melanoma metastatic to the brain and bone.  Patient presents ED with complaints of fatigue.  Patient states she is undergoing immunotherapy treatment.  She had her treatment last week.  Patient states the last day or 2 she has had decreased p.o. intake and has been feeling very fatigued and lethargic.  She has not had any fevers.  She not have any headache.  No chest pain.  No abdominal pain.  No vomiting or diarrhea.  No blood in her stool  Home Medications Prior to Admission medications   Medication Sig Start Date End Date Taking? Authorizing Provider  AMBIEN CR 6.25 MG CR tablet Take 6.25 mg by mouth at bedtime. 05/22/19   [provider]  CRESTOR 10 MG tablet Take 10 mg by mouth daily.    [provider]  cyclobenzaprine (FLEXERIL) 5 MG tablet Take 1 tablet (5 mg total) by mouth 3 (three) times daily as needed for muscle spasms. 12/14/22   Melven Sartorius, MD  dronabinol (MARINOL) 2.5 MG capsule Take 1 capsule (2.5 mg total) by mouth 2 (two) times daily before a meal. 12/16/22   Pickenpack-Cousar, Arty Baumgartner, NP  famotidine-calcium carbonate-magnesium hydroxide (PEPCID COMPLETE) 10-800-165 MG chewable tablet Chew 1 tablet by mouth daily as needed (acid reflux).    [provider]  HYDROmorphone (DILAUDID) 2 MG tablet Take 1 tablet (2 mg total) by mouth every 6 (six) hours as needed for severe pain (pain score 7-10). 12/31/22   Pickenpack-Cousar, Arty Baumgartner, NP  LORazepam (ATIVAN) 0.5 MG tablet Take 1 tab 30 minutes before PET and 2 tabs before MRI 12/08/22   Melven Sartorius, MD   morphine (MS CONTIN) 15 MG 12 hr tablet Take 1 tablet (15 mg total) by mouth every 12 (twelve) hours. 12/29/22   Pickenpack-Cousar, Arty Baumgartner, NP  ondansetron (ZOFRAN) 8 MG tablet Take 1 tablet (8 mg total) by mouth every 8 (eight) hours as needed. 12/15/22   Melven Sartorius, MD  pantoprazole (PROTONIX) 40 MG tablet Take 1 tablet (40 mg total) by mouth daily. 12/16/22   Pickenpack-Cousar, Arty Baumgartner, NP  polyethylene glycol (MIRALAX / GLYCOLAX) 17 g packet Take 17 g by mouth 2 (two) times daily. 12/16/22   Pickenpack-Cousar, Arty Baumgartner, NP  potassium chloride SA (KLOR-CON M) 20 MEQ tablet Take 1 tablet (20 mEq total) by mouth 2 (two) times daily for 5 days. 12/29/22 01/03/23  Pokhrel, Rebekah Chesterfield, MD  pregabalin (LYRICA) 75 MG capsule Take 75 mg by mouth 3 (three) times daily. 08/21/19   [provider]  promethazine (PHENERGAN) 25 MG tablet Take 25 mg by mouth every 12 (twelve) hours as needed for nausea or vomiting.    [provider]  senna-docusate (SENOKOT-S) 8.6-50 MG tablet Take 1 tablet by mouth 2 (two) times daily.    [provider]      Allergies    Aspirin    Review of Systems   Review of Systems  Physical Exam Updated Vital Signs BP 110/64   Pulse (!) 134   Temp 99.5 F (37.5 C)   Resp (!) 22  Ht 1.6 m (5\' 3" )   Wt 59.9 kg   SpO2 96%   BMI 23.38 kg/m  Physical Exam Vitals and nursing note reviewed.  Constitutional:      Appearance: She is well-developed. She is ill-appearing.  HENT:     Head: Normocephalic and atraumatic.     Right Ear: External ear normal.     Left Ear: External ear normal.  Eyes:     General: No scleral icterus.       Right eye: No discharge.        Left eye: No discharge.     Conjunctiva/sclera: Conjunctivae normal.  Neck:     Trachea: No tracheal deviation.  Cardiovascular:     Rate and Rhythm: Regular rhythm. Tachycardia present.  Pulmonary:     Effort: Pulmonary effort is normal. No respiratory distress.     Breath  sounds: Normal breath sounds. No stridor. No wheezing or rales.  Abdominal:     General: Bowel sounds are normal. There is no distension.     Palpations: Abdomen is soft.     Tenderness: There is no abdominal tenderness. There is no guarding or rebound.  Musculoskeletal:        General: No tenderness or deformity.     Cervical back: Neck supple.  Skin:    General: Skin is warm and dry.     Findings: No rash.  Neurological:     General: No focal deficit present.     Mental Status: She is alert.     Cranial Nerves: No cranial nerve deficit, dysarthria or facial asymmetry.     Sensory: No sensory deficit.     Motor: No abnormal muscle tone or seizure activity.     Coordination: Coordination normal.  Psychiatric:        Mood and Affect: Mood normal.     ED Results / Procedures / Treatments   Labs (all labs ordered are listed, but only abnormal results are displayed) Labs Reviewed  CBC WITH DIFFERENTIAL/PLATELET - Abnormal; Notable for the following components:      Result Value   RBC 3.30 (*)    Hemoglobin 10.5 (*)    HCT 31.6 (*)    nRBC 0.6 (*)    Lymphs Abs 0.4 (*)    Abs Immature Granulocytes 0.10 (*)    All other components within normal limits  PROTIME-INR - Abnormal; Notable for the following components:   Prothrombin Time 16.0 (*)    INR 1.3 (*)    All other components within normal limits  URINALYSIS, W/ REFLEX TO CULTURE (INFECTION SUSPECTED) - Abnormal; Notable for the following components:   Color, Urine AMBER (*)    Bilirubin Urine MODERATE (*)    Ketones, ur 5 (*)    Protein, ur 30 (*)    Bacteria, UA RARE (*)    All other components within normal limits  COMPREHENSIVE METABOLIC PANEL - Abnormal; Notable for the following components:   Sodium 134 (*)    Creatinine, Ser 1.08 (*)    Total Protein 5.5 (*)    Albumin 2.2 (*)    AST 163 (*)    ALT 63 (*)    Alkaline Phosphatase 444 (*)    Total Bilirubin 1.8 (*)    All other components within normal limits   I-STAT CG4 LACTIC ACID, ED - Abnormal; Notable for the following components:   Lactic Acid, Venous 2.2 (*)    All other components within normal limits  CULTURE, BLOOD (ROUTINE X 2)  CULTURE, BLOOD (ROUTINE X 2)  TSH  T4, FREE  I-STAT CG4 LACTIC ACID, ED    EKG None  Radiology DG Chest 2 View  Result Date: 01/03/2023 CLINICAL DATA:  Sepsis EXAM: CHEST - 2 VIEW COMPARISON:  12/27/2022 FINDINGS: The heart size and mediastinal contours are within normal limits. Trace pleural effusions. Unchanged left upper lobe lung nodule. The visualized skeletal structures are unremarkable. IMPRESSION: 1. Trace pleural effusions. No focal airspace opacity. 2.  Unchanged left upper lobe lung nodule. Electronically Signed   By: [name]   On: 01/03/2023 15:59    Procedures Procedures  {Document cardiac monitor, telemetry assessment procedure when appropriate:1}  Medications Ordered in ED Medications  ondansetron (ZOFRAN) injection 4 mg (4 mg Intravenous Patient Refused/Not Given 01/03/23 1641)  sodium chloride 0.9 % bolus 2,000 mL (2,000 mLs Intravenous New Bag/Given 01/03/23 1641)    ED Course/ Medical Decision Making/ A&P Clinical Course as of 01/03/23 1936  Sun Jan 03, 2023  1808 CBC with Differential(!) Metabolic panel shows stable anemia.  Lactic acid level slightly elevated [JK]  1927 Comprehensive metabolic panel(!) Bilirubin increased compared to previous value [JK]  1927  patient remains tachycardic.  Blood pressure improving [JK]  1934 Patient remains tachycardic.  She has had a about a liter and a half of fluids infused.  Will continue with fluid resuscitation. [JK]    Clinical Course User Index [JK] Linwood Dibbles, MD   {   Click here for ABCD2, HEART and other calculatorsREFRESH Note before signing :1}                              Medical Decision Making Differential diagnosis includes but not limited to dehydration anemia evolving infection  Problems  Addressed: Dehydration: acute illness or injury that poses a threat to life or bodily functions Metastatic melanoma [facility]): chronic illness or injury with exacerbation, progression, or side effects of treatment Tachycardia: acute illness or injury that poses a threat to life or bodily functions  Amount and/or Complexity of Data Reviewed Labs: ordered. Decision-making details documented in ED Course. Radiology: ordered.  Risk Prescription drug management.   Patient presents ED for evaluation of weakness.  Patient does have history of malignant melanoma that is metastatic.  Patient was recently admitted to the hospital for acute encephalopathy hypokalemia and acute kidney injury.  Patient was discharged on the third.  She presents with recurrent similar symptoms.  Patient is not eating or drinking much.  She is significantly tachycardic on arrival.  She also had borderline low blood pressures.  Patient was treated with IV fluids.  Assess for the possibility of evolving sepsis.  At this time noes clear signs of infection with normal urinalysis.  No pneumonia on chest x-ray and lactic acid levels normal.  Blood cultures have been sent.  Patient remains tachycardic.  I think she would benefit from overnight IV hydration.  I will consult the medical service for admission  {Document critical care time when appropriate:1} {Document review of labs and clinical decision tools ie heart score, Chads2Vasc2 etc:1}  {Document your independent review of radiology images, and any outside records:1} {Document your discussion with family members, caretakers, and with consultants:1} {Document social determinants of health affecting pt's care:1} {Document your decision making why or why not admission, treatments were needed:1} Final Clinical Impression(s) / ED Diagnoses Final diagnoses:  Metastatic melanoma (HCC)  Dehydration  Tachycardia    Rx / DC Orders  ED Discharge Orders     None

## 2023-01-03 NOTE — Assessment & Plan Note (Signed)
See tachycardia above.

## 2023-01-03 NOTE — ED Triage Notes (Signed)
Pt arrived via POV. C/o fatigue that worsened this AM. Has been having limited PO intake. Was recently dc'd after an admission.  Cancer pt- on immunotherapy  AOx4

## 2023-01-03 NOTE — Assessment & Plan Note (Addendum)
Dehydration vs immune effect? Very similar presentation to last week with mild encephalopathy + tachycardia. IVF Tele monitor Husband suspects morphine causing AMS and decreased PO intake again, requests that we hold this med (see admit last week). Pt currently pain scale = 0 Ill hold the MS Contin Check ammonia - this was normal just last week though

## 2023-01-03 NOTE — ED Notes (Signed)
..ED TO INPATIENT HANDOFF REPORT  ED Nurse Name and Phone #: Lyndel Safe Name/Age/Gender Laura [name] 57 y.o. female Room/Bed: WA24/WA24  Code Status   Code Status: Full Code  Home/SNF/Other Home Patient oriented to: self, place, time, and situation Is this baseline? Yes   Triage Complete: Triage complete  Chief Complaint Tachycardia [R00.0]  Triage Note Pt arrived via POV. C/o fatigue that worsened this AM. Has been having limited PO intake. Was recently dc'd after an admission.  Cancer pt- on immunotherapy  AOx4   Allergies Allergies  Allergen Reactions   Aspirin Swelling and Other (See Comments)    SWELLING REACTION UNSPECIFIED    Level of Care/Admitting Diagnosis ED Disposition     ED Disposition  Admit   Condition  --   Comment  Hospital Area: [facility] [[id number]]  Level of Care: Progressive [102]  Admit to Progressive based on following criteria: MULTISYSTEM THREATS such as stable sepsis, metabolic/electrolyte imbalance with or without encephalopathy that is responding to early treatment.  May place patient in observation at [facility] or [facility] if equivalent level of care is available:: No  Covid Evaluation: Asymptomatic - no recent exposure (last 10 days) testing not required  Diagnosis: Tachycardia [213086]  Admitting Physician: Hillary Bow [[id number]]  Attending Physician: Hillary Bow [[id number]]          B Medical/Surgery History Past Medical History:  Diagnosis Date   Anxiety    Arthritis    Complication of anesthesia    Fibromyalgia    Gallstones 04/20/2018   GERD (gastroesophageal reflux disease)    History of blood transfusion    post Hysterectomy   Hypercholesteremia    Melanoma (HCC)    on back and excised   Migraine    PONV (postoperative nausea and vomiting)    Tobacco abuse    Vertebral artery disease (HCC)    history of left vertebral artery dissection 2011   Past Surgical History:   Procedure Laterality Date   ABDOMINAL HYSTERECTOMY     ANTERIOR FUSION CERVICAL SPINE  2008   BONE GRAFT HIP ILIAC CREST Left    Left Hip to Left Wrist   CHOLECYSTECTOMY N/A 04/20/2018   Procedure: LAPAROSCOPIC CHOLECYSTECTOMY;  Surgeon: Claud Kelp, MD;  Location: [facility] OR;  Service: General;  Laterality: N/A;   RADIOLOGY WITH ANESTHESIA N/A 12/17/2022   Procedure: MRI BRAIN WITH AND WITHOUT CONTRAST, MRI LUMBAR SPINE WITH AND WITHOUT CONTRAST WITH ANESTHESIA;  Surgeon: Radiologist, Medication, MD;  Location: [facility] OR;  Service: Radiology;  Laterality: N/A;   thumb surgery Left 2018   [facility]- Tendons Carpal tunnel   WRIST SURGERY  1994     A IV Location/Drains/Wounds Patient Lines/Drains/Airways Status     Active Line/Drains/Airways     Name Placement date Placement time Site Days   Peripheral IV 01/03/23 22 G Posterior;Right Hand 01/03/23  1611  Hand  less than 1   Peripheral IV 01/03/23 20 G Left Antecubital 01/03/23  1602  Antecubital  less than 1            Intake/Output Last 24 hours No intake or output data in the 24 hours ending 01/03/23 2041  Labs/Imaging Results for orders placed or performed during the hospital encounter of 01/03/23 (from the past 48 hour(s))  CBC with Differential     Status: Abnormal   Collection Time: 01/03/23  4:02 PM  Result Value Ref Range   WBC 7.2 4.0 - 10.5 K/uL  RBC 3.30 (L) 3.87 - 5.11 MIL/uL   Hemoglobin 10.5 (L) 12.0 - 15.0 g/dL   HCT 65.7 (L) 84.6 - 96.2 %   MCV 95.8 80.0 - 100.0 fL   MCH 31.8 26.0 - 34.0 pg   MCHC 33.2 30.0 - 36.0 g/dL   RDW 95.2 84.1 - 32.4 %   Platelets 256 150 - 400 K/uL   nRBC 0.6 (H) 0.0 - 0.2 %   Neutrophils Relative % 78 %   Neutro Abs 5.6 1.7 - 7.7 K/uL   Lymphocytes Relative 6 %   Lymphs Abs 0.4 (L) 0.7 - 4.0 K/uL   Monocytes Relative 11 %   Monocytes Absolute 0.8 0.1 - 1.0 K/uL   Eosinophils Relative 3 %   Eosinophils Absolute 0.2 0.0 - 0.5 K/uL   Basophils Relative 0 %   Basophils Absolute  0.0 0.0 - 0.1 K/uL   Myelocytes 2 %   Abs Immature Granulocytes 0.10 (H) 0.00 - 0.07 K/uL    Comment: Performed at [facility], [address] [place], [place] [address]  Protime-INR     Status: Abnormal   Collection Time: 01/03/23  4:02 PM  Result Value Ref Range   Prothrombin Time 16.0 (H) 11.4 - 15.2 seconds   INR 1.3 (H) 0.8 - 1.2    Comment: (NOTE) INR goal varies based on device and disease states. Performed at [facility], [address], [place], [place] [address]   I-Stat Lactic Acid, ED     Status: Abnormal   Collection Time: 01/03/23  4:30 PM  Result Value Ref Range   Lactic Acid, Venous 2.2 (HH) 0.5 - 1.9 mmol/L   Comment NOTIFIED PHYSICIAN   Comprehensive metabolic panel     Status: Abnormal   Collection Time: 01/03/23  5:17 PM  Result Value Ref Range   Sodium 134 (L) 135 - 145 mmol/L   Potassium 4.6 3.5 - 5.1 mmol/L    Comment: HEMOLYSIS AT THIS LEVEL MAY AFFECT RESULT   Chloride 99 98 - 111 mmol/L   CO2 26 22 - 32 mmol/L   Glucose, Bld 95 70 - 99 mg/dL    Comment: Glucose reference range applies only to samples taken after fasting for at least 8 hours.   BUN 14 6 - 20 mg/dL   Creatinine, Ser 6.44 (H) 0.44 - 1.00 mg/dL   Calcium 9.2 8.9 - 03.4 mg/dL   Total Protein 5.5 (L) 6.5 - 8.1 g/dL   Albumin 2.2 (L) 3.5 - 5.0 g/dL   AST 742 (H) 15 - 41 U/L    Comment: HEMOLYSIS AT THIS LEVEL MAY AFFECT RESULT   ALT 63 (H) 0 - 44 U/L    Comment: HEMOLYSIS AT THIS LEVEL MAY AFFECT RESULT   Alkaline Phosphatase 444 (H) 38 - 126 U/L   Total Bilirubin 1.8 (H) <1.2 mg/dL    Comment: HEMOLYSIS AT THIS LEVEL MAY AFFECT RESULT   GFR, Estimated >60 >60 mL/min    Comment: (NOTE) Calculated using the CKD-EPI Creatinine Equation (2021)    Anion gap 9 5 - 15    Comment: Performed at [facility], [address] [place], [place] [address]  I-Stat Lactic Acid, ED     Status: None   Collection Time: 01/03/23  6:46 PM   Result Value Ref Range   Lactic Acid, Venous 1.7 0.5 - 1.9 mmol/L  Urinalysis, w/ Reflex to Culture (Infection Suspected) -Urine, Clean Catch     Status: Abnormal  Collection Time: 01/03/23  6:56 PM  Result Value Ref Range   Specimen Source URINE, CATHETERIZED    Color, Urine AMBER (A) YELLOW    Comment: BIOCHEMICALS MAY BE AFFECTED BY COLOR   APPearance CLEAR CLEAR   Specific Gravity, Urine 1.023 1.005 - 1.030   pH 5.0 5.0 - 8.0   Glucose, UA NEGATIVE NEGATIVE mg/dL   Hgb urine dipstick NEGATIVE NEGATIVE   Bilirubin Urine MODERATE (A) NEGATIVE   Ketones, ur 5 (A) NEGATIVE mg/dL   Protein, ur 30 (A) NEGATIVE mg/dL   Nitrite NEGATIVE NEGATIVE   Leukocytes,Ua NEGATIVE NEGATIVE   RBC / HPF 6-10 0 - 5 RBC/hpf   WBC, UA 6-10 0 - 5 WBC/hpf    Comment:        Reflex urine culture not performed if WBC <=10, OR if Squamous epithelial cells >5. If Squamous epithelial cells >5 suggest recollection.    Bacteria, UA RARE (A) NONE SEEN   Squamous Epithelial / HPF 6-10 0 - 5 /HPF   Mucus PRESENT    Granular Casts, UA PRESENT    Uric Acid Crys, UA PRESENT     Comment: Performed at [facility], [address] [place], [place] [address]   DG Chest 2 View  Result Date: 01/03/2023 CLINICAL DATA:  Sepsis EXAM: CHEST - 2 VIEW COMPARISON:  12/27/2022 FINDINGS: The heart size and mediastinal contours are within normal limits. Trace pleural effusions. Unchanged left upper lobe lung nodule. The visualized skeletal structures are unremarkable. IMPRESSION: 1. Trace pleural effusions. No focal airspace opacity. 2.  Unchanged left upper lobe lung nodule. Electronically Signed   By: [name]   On: 01/03/2023 15:59    Pending Labs Unresulted Labs (From admission, onward)     Start     Ordered   01/04/23 0500  CBC  Tomorrow morning,   R        01/03/23 2031   01/04/23 0500  Basic metabolic panel  Tomorrow morning,   R        01/03/23 2031   01/03/23 2032  Cortisol  Once,    R        01/03/23 2031   01/03/23 2027  Uric acid  Once,   STAT        01/03/23 2026   01/03/23 1934  TSH  Once,   URGENT        01/03/23 1934   01/03/23 1934  T4, free  Once,   URGENT        01/03/23 1934   01/03/23 1535  Culture, blood (Routine x 2)  BLOOD CULTURE X 2,   R (with STAT occurrences)      01/03/23 1535            Vitals/Pain Today's Vitals   01/03/23 1800 01/03/23 1830 01/03/23 1930 01/03/23 2030  BP: 90/80 108/69 110/64 108/66  Pulse: (!) 133 (!) 126 (!) 134 (!) 129  Resp: 19 17 (!) 22 (!) 21  Temp:   99.5 F (37.5 C)   TempSrc:      SpO2: 99% 100% 96% 95%  Weight:      Height:      PainSc:        Isolation Precautions No active isolations  Medications Medications  ondansetron (ZOFRAN) injection 4 mg (4 mg Intravenous Patient Refused/Not Given 01/03/23 1641)  lactated ringers infusion (has no administration in time range)  enoxaparin (LOVENOX) injection 40 mg (has no administration in time range)  acetaminophen (TYLENOL) tablet 650 mg (has no administration in time range)    Or  acetaminophen (TYLENOL) suppository 650 mg (has no administration in time range)  ondansetron (ZOFRAN) tablet 4 mg (has no administration in time range)    Or  ondansetron (ZOFRAN) injection 4 mg (has no administration in time range)  sodium chloride 0.9 % bolus 2,000 mL (2,000 mLs Intravenous New Bag/Given 01/03/23 1641)    Mobility walks     Focused Assessments     R Recommendations: See Admitting Provider Note  Report given to:   Additional Notes:

## 2023-01-03 NOTE — H&P (Signed)
History and Physical    Patient: Laura [name]:[id number] DOB: 1965/05/01 DOA: 01/03/2023 DOS: the patient was seen and examined on 01/03/2023 PCP: [name], [name]  Patient coming from: Home  Chief Complaint:  Chief Complaint  Patient presents with   Fatigue   HPI: Laura [name] is a 57 y.o. female with medical history significant of malignant melanoma with mets to brain, bone, and liver diagnosed near end of Oct.  Pt started on Immunotherapy + radiation in Nov.  Pt recently admitted to hospital earlier this month with dehydration and AMS that was felt to be multifactorial from dehydration + opiates being used to control her pain.  Pt seemed to improve with IVF + holding home opiates and was discharged 12/3.  Pt got next round of immunotherapy on 12/5  Pt in to ED today with c/o fatigue.  Patient states the last day or 2 she has had decreased p.o. intake and has been feeling very fatigued and lethargic. She has not had any fevers. She not have any headache. No chest pain. No abdominal pain. No vomiting or diarrhea. No blood in her stool.  Husband suspects that the long acting morphine may once again be to blame.  Pt currently with 0/10 pain.  Review of Systems: As mentioned in the history of present illness. All other systems reviewed and are negative. Past Medical History:  Diagnosis Date   Anxiety    Arthritis    Complication of anesthesia    Fibromyalgia    Gallstones 04/20/2018   GERD (gastroesophageal reflux disease)    History of blood transfusion    post Hysterectomy   Hypercholesteremia    Melanoma (HCC)    on back and excised   Migraine    PONV (postoperative nausea and vomiting)    Tobacco abuse    Vertebral artery disease (HCC)    history of left vertebral artery dissection 2011   Past Surgical History:  Procedure Laterality Date   ABDOMINAL HYSTERECTOMY     ANTERIOR FUSION CERVICAL SPINE  2008   BONE GRAFT HIP ILIAC CREST Left    Left Hip to  Left Wrist   CHOLECYSTECTOMY N/A 04/20/2018   Procedure: LAPAROSCOPIC CHOLECYSTECTOMY;  Surgeon: Claud Kelp, MD;  Location: [facility] OR;  Service: General;  Laterality: N/A;   RADIOLOGY WITH ANESTHESIA N/A 12/17/2022   Procedure: MRI BRAIN WITH AND WITHOUT CONTRAST, MRI LUMBAR SPINE WITH AND WITHOUT CONTRAST WITH ANESTHESIA;  Surgeon: Radiologist, Medication, MD;  Location: [facility] OR;  Service: Radiology;  Laterality: N/A;   thumb surgery Left 2018   [facility]- Tendons Carpal tunnel   WRIST SURGERY  1994   Social History:  reports that she quit smoking about 6 years ago. Her smoking use included cigarettes. She started smoking about 26 years ago. She has a 20 pack-year smoking history. She has never used smokeless tobacco. She reports that she does not drink alcohol and does not use drugs.  Allergies  Allergen Reactions   Aspirin Swelling and Other (See Comments)    SWELLING REACTION UNSPECIFIED    Family History  Problem Relation Age of Onset   Lung cancer Father        smoking hx   Breast cancer Sister        diagnosed over the age of 46   Breast cancer Sister        diagnosed over the age of 41    Prior to Admission medications   Medication Sig Start Date End Date Taking? Authorizing  Provider  AMBIEN CR 6.25 MG CR tablet Take 6.25 mg by mouth at bedtime. 05/22/19  Yes [provider]  busPIRone (BUSPAR) 15 MG tablet Take 15 mg by mouth 2 (two) times daily.   Yes [provider]  cyclobenzaprine (FLEXERIL) 5 MG tablet Take 1 tablet (5 mg total) by mouth 3 (three) times daily as needed for muscle spasms. 12/14/22  Yes Melven Sartorius, MD  dronabinol (MARINOL) 2.5 MG capsule Take 1 capsule (2.5 mg total) by mouth 2 (two) times daily before a meal. Patient taking differently: Take 2.5-5 mg by mouth See admin instructions. Take 5 mg by mouth in the morning and 2.5 mg at bedtime 12/16/22  Yes Pickenpack-Cousar, Arty Baumgartner, NP  famotidine-calcium carbonate-magnesium hydroxide  (PEPCID COMPLETE) 10-800-165 MG chewable tablet Chew 1 tablet by mouth in the morning.   Yes [provider]  HYDROmorphone (DILAUDID) 2 MG tablet Take 1 tablet (2 mg total) by mouth every 6 (six) hours as needed for severe pain (pain score 7-10). 12/31/22  Yes Pickenpack-Cousar, Arty Baumgartner, NP  LORazepam (ATIVAN) 0.5 MG tablet Take 1 tab 30 minutes before PET and 2 tabs before MRI Patient taking differently: Take 0.5-1 mg by mouth See admin instructions. Take 0.5 mg (1 tablet) by mouth 30 minutes before PET scans and 1 mg (2 tablets) before MRI(s) 12/08/22  Yes Melven Sartorius, MD  morphine (MS CONTIN) 15 MG 12 hr tablet Take 1 tablet (15 mg total) by mouth every 12 (twelve) hours. 12/29/22  Yes Pickenpack-Cousar, Arty Baumgartner, NP  ondansetron (ZOFRAN) 8 MG tablet Take 1 tablet (8 mg total) by mouth every 8 (eight) hours as needed. Patient taking differently: Take 8 mg by mouth every 8 (eight) hours as needed for nausea or vomiting. 12/15/22  Yes Melven Sartorius, MD  pantoprazole (PROTONIX) 40 MG tablet Take 1 tablet (40 mg total) by mouth daily. Patient taking differently: Take 40 mg by mouth in the morning. 12/16/22  Yes Pickenpack-Cousar, Arty Baumgartner, NP  potassium chloride SA (KLOR-CON M) 20 MEQ tablet Take 1 tablet (20 mEq total) by mouth 2 (two) times daily for 5 days. 12/29/22 01/03/23 Yes Pokhrel, Laxman, MD  pregabalin (LYRICA) 75 MG capsule Take 75 mg by mouth 3 (three) times daily. 08/21/19  Yes [provider]  promethazine (PHENERGAN) 25 MG tablet Take 25 mg by mouth every 12 (twelve) hours as needed for nausea or vomiting.   Yes [provider]  rosuvastatin (CRESTOR) 10 MG tablet Take 10 mg by mouth at bedtime.   Yes [provider]  polyethylene glycol (MIRALAX / GLYCOLAX) 17 g packet Take 17 g by mouth 2 (two) times daily. Patient not taking: Reported on 01/03/2023 12/16/22   Pickenpack-Cousar, Arty Baumgartner, NP  senna-docusate (SENOKOT-S) 8.6-50 MG tablet Take 1  tablet by mouth 2 (two) times daily.    [provider]    Physical Exam: Vitals:   01/03/23 1830 01/03/23 1930 01/03/23 2030 01/03/23 2139  BP: 108/69 110/64 108/66 103/64  Pulse: (!) 126 (!) 134 (!) 129 (!) 127  Resp: 17 (!) 22 (!) 21 16  Temp:  99.5 F (37.5 C)  98.4 F (36.9 C)  TempSrc:    Oral  SpO2: 100% 96% 95% 94%  Weight:      Height:       Constitutional: NAD, calm, comfortable, falls asleep easily, fatigued Respiratory: clear to auscultation bilaterally, no wheezing, no crackles. Normal respiratory effort. No accessory muscle use.  Cardiovascular: Tachycardic Abdomen: no  tenderness, no masses palpated. No hepatosplenomegaly. Bowel sounds positive.  Neurologic: CN 2-12 grossly intact. Sensation intact, DTR normal. Strength 5/5 in all 4.  Psychiatric: Normal judgment and insight. Alert and oriented x 3. Normal mood.   Data Reviewed:    Labs on Admission: I have personally reviewed following labs and imaging studies  CBC: Recent Labs  Lab 12/28/22 0729 12/29/22 0418 12/31/22 1303 01/03/23 1602  WBC 5.4 5.4 7.8 7.2  NEUTROABS  --   --  6.2 5.6  HGB 9.1* 9.0* 10.7* 10.5*  HCT 27.6* 26.3* 32.3* 31.6*  MCV 94.8 93.6 93.9 95.8  PLT 227 223 301 256   Basic Metabolic Panel: Recent Labs  Lab 12/28/22 0729 12/29/22 0418 12/31/22 1303 01/03/23 1717  NA 132* 133* 136 134*  K 3.1* 2.7* 3.4* 4.6  CL 95* 99 97* 99  CO2 29 30 34* 26  GLUCOSE 122* 100* 107* 95  BUN 13 10 14 14   CREATININE 1.04* 0.98 1.09* 1.08*  CALCIUM 9.4 8.7* 10.3 9.2  MG 2.2 2.0  --   --   PHOS 2.6 3.1  --   --    GFR: Estimated Creatinine Clearance: 49.3 mL/min (A) (by C-G formula based on SCr of 1.08 mg/dL (H)). Liver Function Tests: Recent Labs  Lab 12/28/22 0729 12/29/22 0418 12/31/22 1303 01/03/23 1717  AST 90* 77* 102* 163*  ALT 41 35 40 63*  ALKPHOS 234* 228* 384* 444*  BILITOT 0.8 1.0 1.1 1.8*  PROT 5.3* 5.0* 6.2* 5.5*  ALBUMIN 2.3* 2.1* 3.1* 2.2*   No results  for input(s): "LIPASE", "AMYLASE" in the last 168 hours. Recent Labs  Lab 12/28/22 0729  AMMONIA 34   Coagulation Profile: Recent Labs  Lab 01/03/23 1602  INR 1.3*   Cardiac Enzymes: No results for input(s): "CKTOTAL", "CKMB", "CKMBINDEX", "TROPONINI" in the last 168 hours. BNP (last 3 results) No results for input(s): "PROBNP" in the last 8760 hours. HbA1C: No results for input(s): "HGBA1C" in the last 72 hours. CBG: Recent Labs  Lab 12/27/22 2309  GLUCAP 118*   Lipid Profile: No results for input(s): "CHOL", "HDL", "LDLCALC", "TRIG", "CHOLHDL", "LDLDIRECT" in the last 72 hours. Thyroid Function Tests: Recent Labs    01/03/23 1956  TSH 1.300   Anemia Panel: No results for input(s): "VITAMINB12", "FOLATE", "FERRITIN", "TIBC", "IRON", "RETICCTPCT" in the last 72 hours. Urine analysis:    Component Value Date/Time   COLORURINE AMBER (A) 01/03/2023 1856   APPEARANCEUR CLEAR 01/03/2023 1856   LABSPEC 1.023 01/03/2023 1856   PHURINE 5.0 01/03/2023 1856   GLUCOSEU NEGATIVE 01/03/2023 1856   HGBUR NEGATIVE 01/03/2023 1856   HGBUR negative 01/07/2010 1438   BILIRUBINUR MODERATE (A) 01/03/2023 1856   KETONESUR 5 (A) 01/03/2023 1856   PROTEINUR 30 (A) 01/03/2023 1856   UROBILINOGEN 0.2 05/01/2014 1000   NITRITE NEGATIVE 01/03/2023 1856   LEUKOCYTESUR NEGATIVE 01/03/2023 1856    Radiological Exams on Admission: DG Chest 2 View  Result Date: 01/03/2023 CLINICAL DATA:  Sepsis EXAM: CHEST - 2 VIEW COMPARISON:  12/27/2022 FINDINGS: The heart size and mediastinal contours are within normal limits. Trace pleural effusions. Unchanged left upper lobe lung nodule. The visualized skeletal structures are unremarkable. IMPRESSION: 1. Trace pleural effusions. No focal airspace opacity. 2.  Unchanged left upper lobe lung nodule. Electronically Signed   By: [name]   On: 01/03/2023 15:59    EKG: Independently reviewed.   Assessment and Plan: * Tachycardia Dehydration vs  immune effect? Very similar presentation to  last week with mild encephalopathy + tachycardia. IVF Tele monitor Husband suspects morphine causing AMS and decreased PO intake again, requests that we hold this med (see admit last week). Pt currently pain scale = 0 Ill hold the MS Contin Check ammonia - this was normal just last week though  Acute encephalopathy See tachycardia above.  Metastatic malignant melanoma (HCC) Pt just got immunotherapy on 12/5. D/w Dr. Pamelia Hoit: "I think immune mediated Adverse effect is less likely here. Nothing here is classic (examples like hypotension from adrenal insufficiency, tachycardia from hyperthyroidism , diarrhea from colitis. Skin rash etc) it might be dehydration more than immunotherapy related. Hydrate and observe would be my suggestion. Unless you repeat TSH and thyroid studies and they show hyperthyroidism" TSH nl again Checking cortisol Putting pt on IVF      Advance Care Planning:   Code Status: Full Code  Consults: None  Family Communication: Husband at bedside  Severity of Illness: The appropriate patient status for this patient is OBSERVATION. Observation status is judged to be reasonable and necessary in order to provide the required intensity of service to ensure the patient's safety. The patient's presenting symptoms, physical exam findings, and initial radiographic and laboratory data in the context of their medical condition is felt to place them at decreased risk for further clinical deterioration. Furthermore, it is anticipated that the patient will be medically stable for discharge from the hospital within 2 midnights of admission.   Author: Hillary Bow., DO 01/03/2023 10:11 PM  For on call review [email or web address].

## 2023-01-04 ENCOUNTER — Encounter (HOSPITAL_COMMUNITY): Payer: Self-pay | Admitting: Internal Medicine

## 2023-01-04 DIAGNOSIS — G9340 Encephalopathy, unspecified: Secondary | ICD-10-CM | POA: Diagnosis not present

## 2023-01-04 DIAGNOSIS — T451X5A Adverse effect of antineoplastic and immunosuppressive drugs, initial encounter: Secondary | ICD-10-CM | POA: Diagnosis present

## 2023-01-04 DIAGNOSIS — G893 Neoplasm related pain (acute) (chronic): Secondary | ICD-10-CM

## 2023-01-04 DIAGNOSIS — N179 Acute kidney failure, unspecified: Secondary | ICD-10-CM

## 2023-01-04 DIAGNOSIS — N289 Disorder of kidney and ureter, unspecified: Secondary | ICD-10-CM | POA: Diagnosis present

## 2023-01-04 DIAGNOSIS — F17290 Nicotine dependence, other tobacco product, uncomplicated: Secondary | ICD-10-CM | POA: Diagnosis present

## 2023-01-04 DIAGNOSIS — E7800 Pure hypercholesterolemia, unspecified: Secondary | ICD-10-CM | POA: Diagnosis present

## 2023-01-04 DIAGNOSIS — T402X5A Adverse effect of other opioids, initial encounter: Secondary | ICD-10-CM | POA: Diagnosis present

## 2023-01-04 DIAGNOSIS — Z886 Allergy status to analgesic agent status: Secondary | ICD-10-CM | POA: Diagnosis not present

## 2023-01-04 DIAGNOSIS — E871 Hypo-osmolality and hyponatremia: Secondary | ICD-10-CM | POA: Diagnosis present

## 2023-01-04 DIAGNOSIS — R630 Anorexia: Secondary | ICD-10-CM

## 2023-01-04 DIAGNOSIS — Z79891 Long term (current) use of opiate analgesic: Secondary | ICD-10-CM | POA: Diagnosis not present

## 2023-01-04 DIAGNOSIS — T426X5A Adverse effect of other antiepileptic and sedative-hypnotic drugs, initial encounter: Secondary | ICD-10-CM | POA: Diagnosis present

## 2023-01-04 DIAGNOSIS — D6481 Anemia due to antineoplastic chemotherapy: Secondary | ICD-10-CM | POA: Insufficient documentation

## 2023-01-04 DIAGNOSIS — Z85820 Personal history of malignant melanoma of skin: Secondary | ICD-10-CM | POA: Diagnosis not present

## 2023-01-04 DIAGNOSIS — C787 Secondary malignant neoplasm of liver and intrahepatic bile duct: Secondary | ICD-10-CM | POA: Diagnosis present

## 2023-01-04 DIAGNOSIS — C7931 Secondary malignant neoplasm of brain: Secondary | ICD-10-CM | POA: Diagnosis present

## 2023-01-04 DIAGNOSIS — G9341 Metabolic encephalopathy: Secondary | ICD-10-CM | POA: Diagnosis present

## 2023-01-04 DIAGNOSIS — E872 Acidosis, unspecified: Secondary | ICD-10-CM | POA: Diagnosis present

## 2023-01-04 DIAGNOSIS — E861 Hypovolemia: Secondary | ICD-10-CM | POA: Diagnosis present

## 2023-01-04 DIAGNOSIS — C7951 Secondary malignant neoplasm of bone: Secondary | ICD-10-CM | POA: Diagnosis present

## 2023-01-04 DIAGNOSIS — G928 Other toxic encephalopathy: Secondary | ICD-10-CM | POA: Diagnosis present

## 2023-01-04 DIAGNOSIS — Z79899 Other long term (current) drug therapy: Secondary | ICD-10-CM | POA: Diagnosis not present

## 2023-01-04 DIAGNOSIS — C439 Malignant melanoma of skin, unspecified: Secondary | ICD-10-CM | POA: Diagnosis present

## 2023-01-04 DIAGNOSIS — R000 Tachycardia, unspecified: Secondary | ICD-10-CM | POA: Diagnosis present

## 2023-01-04 DIAGNOSIS — T481X5A Adverse effect of skeletal muscle relaxants [neuromuscular blocking agents], initial encounter: Secondary | ICD-10-CM | POA: Diagnosis present

## 2023-01-04 DIAGNOSIS — E860 Dehydration: Secondary | ICD-10-CM

## 2023-01-04 DIAGNOSIS — M797 Fibromyalgia: Secondary | ICD-10-CM | POA: Diagnosis present

## 2023-01-04 LAB — BASIC METABOLIC PANEL WITH GFR
Anion gap: 5 (ref 5–15)
BUN: 13 mg/dL (ref 6–20)
CO2: 25 mmol/L (ref 22–32)
Calcium: 8.4 mg/dL — ABNORMAL LOW (ref 8.9–10.3)
Chloride: 104 mmol/L (ref 98–111)
Creatinine, Ser: 0.9 mg/dL (ref 0.44–1.00)
GFR, Estimated: >60 mL/min
Glucose, Bld: 108 mg/dL — ABNORMAL HIGH (ref 70–99)
Potassium: 3.7 mmol/L (ref 3.5–5.1)
Sodium: 134 mmol/L — ABNORMAL LOW (ref 135–145)

## 2023-01-04 LAB — CBC
HCT: 24.4 % — ABNORMAL LOW (ref 36.0–46.0)
Hemoglobin: 8 g/dL — ABNORMAL LOW (ref 12.0–15.0)
MCH: 31.9 pg (ref 26.0–34.0)
MCHC: 32.8 g/dL (ref 30.0–36.0)
MCV: 97.2 fL (ref 80.0–100.0)
Platelets: 182 10*3/uL (ref 150–400)
RBC: 2.51 MIL/uL — ABNORMAL LOW (ref 3.87–5.11)
RDW: 14 % (ref 11.5–15.5)
WBC: 5.7 10*3/uL (ref 4.0–10.5)
nRBC: 0.5 % — ABNORMAL HIGH (ref 0.0–0.2)

## 2023-01-04 LAB — OCCULT BLOOD X 1 CARD TO LAB, STOOL: Fecal Occult Bld: NEGATIVE

## 2023-01-04 LAB — AMMONIA: Ammonia: 31 umol/L (ref 9–35)

## 2023-01-04 LAB — CORTISOL: Cortisol, Plasma: 20.7 ug/dL

## 2023-01-04 MED ORDER — PREGABALIN 75 MG PO CAPS: 75.0000 mg | ORAL_CAPSULE | Freq: Every evening | ORAL | Status: DC | PRN

## 2023-01-04 MED ORDER — ENSURE ENLIVE PO LIQD
237.0000 mL | Freq: Two times a day (BID) | ORAL | Status: DC
Start: 2023-01-04 — End: 2023-01-05
  Administered 2023-01-04 – 2023-01-05 (×2): 237 mL via ORAL

## 2023-01-04 MED ORDER — SODIUM CHLORIDE 0.9 % IV SOLN: INTRAVENOUS | Status: AC

## 2023-01-04 MED ORDER — MORPHINE SULFATE ER 15 MG PO TBCR: 15.0000 mg | EXTENDED_RELEASE_TABLET | Freq: Two times a day (BID) | ORAL | Status: DC

## 2023-01-04 MED ORDER — PREGABALIN 75 MG PO CAPS
75.0000 mg | ORAL_CAPSULE | Freq: Every day | ORAL | Status: DC
  Administered 2023-01-04 – 2023-01-05 (×2): 75 mg via ORAL
  Filled 2023-01-04: qty 1

## 2023-01-04 NOTE — Plan of Care (Signed)
  Problem: Pain Management: Goal: General experience of comfort will improve Outcome: Progressing   Problem: Skin Integrity: Goal: Risk for impaired skin integrity will decrease Outcome: Progressing   Problem: Safety: Goal: Ability to remain free from injury will improve Outcome: Progressing   Problem: Coping: Goal: Level of anxiety will decrease Outcome: Progressing

## 2023-01-04 NOTE — Plan of Care (Signed)
  Problem: Education: Goal: Knowledge of General Education information will improve Description: Including pain rating scale, medication(s)/side effects and non-pharmacologic comfort measures Outcome: Progressing   Problem: Clinical Measurements: Goal: Diagnostic test results will improve Outcome: Progressing   Problem: Coping: Goal: Level of anxiety will decrease Outcome: Progressing   

## 2023-01-04 NOTE — TOC Initial Note (Signed)
Transition of Care [facility]) - Initial/Assessment Note    Patient Details  Name: Laura [name] MRN: [id number] Date of Birth: 03-16-1965  Transition of Care [facility]) CM/SW Contact:    Lanier Clam, RN Phone Number: 01/04/2023, 3:54 PM  Clinical Narrative:    d/c plan home               Expected Discharge Plan: Home/Self Care Barriers to Discharge: Continued Medical Work up   Patient Goals and CMS Choice            Expected Discharge Plan and Services                                              Prior Living Arrangements/Services                       Activities of Daily Living   ADL Screening (condition at time of admission) Independently performs ADLs?: Yes (appropriate for developmental age) Is the patient deaf or have difficulty hearing?: No Does the patient have difficulty seeing, even when wearing glasses/contacts?: No Does the patient have difficulty concentrating, remembering, or making decisions?: No  Permission Sought/Granted                  Emotional Assessment              Admission diagnosis:  Dehydration [E86.0] Tachycardia [R00.0] Metastatic melanoma (HCC) [C43.9] Acute metabolic encephalopathy [G93.41] Patient Active Problem List   Diagnosis Date Noted   Antineoplastic chemotherapy induced anemia 01/04/2023   Dehydration 01/04/2023   Decreased appetite 01/04/2023   Metastatic melanoma (HCC) 01/04/2023   Acute metabolic encephalopathy 01/04/2023   Tachycardia 01/03/2023   Acute encephalopathy 12/28/2022   Hypokalemia 12/28/2022   AKI (acute kidney injury) (HCC) 12/28/2022   Acute respiratory failure with hypoxia (HCC) 12/28/2022   Metastatic cancer to brain (HCC) 12/28/2022   Renal insufficiency 12/11/2022   Malignant melanoma metastatic to bone (HCC) 12/11/2022   Constipation 12/07/2022   Cancer associated pain 12/07/2022   Metastatic malignant melanoma (HCC) 12/04/2022   Gallstones 04/20/2018   Tobacco  abuse 05/18/2014   Vertebral artery disease (HCC) 05/18/2014   Heel pain 06/12/2010   ACHILLES TENDINITIS 01/07/2010   DYSURIA 01/07/2010   MIGRAINE HEADACHE 11/14/2009   DISC DISEASE, CERVICAL 11/14/2009   PCP:  [name], [name] Pharmacy:   [facility] [address] [place] [address] Phone: [phone] Fax: [phone]     Social Determinants of Health (SDOH) Social History: SDOH Screenings   Food Insecurity: No Food Insecurity (01/03/2023)  Housing: Low Risk  (01/03/2023)  Transportation Needs: No Transportation Needs (01/03/2023)  Utilities: Not At Risk (01/03/2023)  Alcohol Screen: Low Risk  (12/11/2022)  Depression (PHQ2-9): Low Risk  (12/11/2022)  Tobacco Use: Medium Risk (12/31/2022)   SDOH Interventions:     Readmission Risk Interventions     No data to display

## 2023-01-04 NOTE — Progress Notes (Addendum)
[facility] PROGRESS NOTE    Progress Note  Laura [name]  ZOX:[id number] DOB: 1965/04/04 DOA: 01/03/2023 PCP: [name], [name]     Brief Narrative:   Laura [name] is an 57 y.o. female past medical history significant of malignant melanoma to the brain bone and liver diagnosed in October she was started on immunotherapy and radiation therapy in November, recently discharged from the hospital for acute metabolic encephalopathy that was likely multifactorial which improved with holding IV fluids and opiates, came in complaining of fatigue decreased oral intake and lethargy denies any fever abdominal pain nausea vomiting or diarrhea.   Assessment/Plan:   Acute metabolic encephalopathy: Probably multifactorial in the setting of dehydration, her specific gravity is 1023 and polypharmacy at home she is on Phenergan, Ambien, Flexeril, Dilaudid, Ativan, MS Contin and Lyrica. MS Contin has been held.  Ammonia level 30.  UA is low yield for infectious etiology. Started on IV fluids, blood cultures have been ordered. Monitor strict I's and O's and daily weights. Mediated immunotherapy effect is unlikely, she is not hypotensive, no diarrhea no abdominal pain. I would lean towards discontinuing Phenergan and Lyrica upon discharge  Antineoplastic chemotherapy induced anemia: Back in December 09, 2022 hemoglobin was 13, today is 8.0. FOBT x 3, her husband and the patient relates no melanotic stools or bright red blood per rectum  Sinus tachycardia: Multifactorial question due to dehydration and anemia she was started on IV fluids. She was tachycardic on last admission 2.  Cortisol was 20 this morning, TSH 1.3. She did not receive oxygen therapy as she is awaiting for insurance coverage.  Elevated LFTs: Likely due to metastatic disease.  Metastatic malignant melanoma [facility]) Patient received immunotherapy in December 5.  Undergoing palliative chemo and radiation.  Hypovolemic  hyponatremia: Continue IV fluids for today recheck in the morning.  Mild lactic acidosis: Resolved with IV fluids.  DVT prophylaxis: lovenox Family Communication:husband Status is: Observation The patient remains OBS appropriate and will d/c before 2 midnights.  Acute metabolic encephalopathy    Code Status:     Code Status Orders  (From admission, onward)           Start     Ordered   01/03/23 2030  Full code  Continuous       Question:  By:  Answer:  Default: patient does not have capacity for decision making, no surrogate or prior directive available   01/03/23 2031           Code Status History     Date Active Date Inactive Code Status Order ID Comments User Context   12/28/2022 0342 12/29/2022 2202 Full Code [id number]  Briscoe Deutscher, MD ED   05/01/2014 1133 05/02/2014 0341 Full Code [id number]  Lisbeth Renshaw, MD HOV         IV Access:   Peripheral IV   Procedures and diagnostic studies:   DG Chest 2 View  Result Date: 01/03/2023 CLINICAL DATA:  Sepsis EXAM: CHEST - 2 VIEW COMPARISON:  12/27/2022 FINDINGS: The heart size and mediastinal contours are within normal limits. Trace pleural effusions. Unchanged left upper lobe lung nodule. The visualized skeletal structures are unremarkable. IMPRESSION: 1. Trace pleural effusions. No focal airspace opacity. 2.  Unchanged left upper lobe lung nodule. Electronically Signed   By: [name]   On: 01/03/2023 15:59     Medical Consultants:   None.   Subjective:    Laura [name] she relates she feels better  Objective:  Vitals:   01/03/23 2030 01/03/23 2139 01/04/23 0011 01/04/23 0347  BP: 108/66 103/64 93/60 109/65  Pulse: (!) 129 (!) 127 (!) 123 (!) 117  Resp: (!) 21 16 17 18   Temp:  98.4 F (36.9 C) 98.9 F (37.2 C) 98.5 F (36.9 C)  TempSrc:  Oral Oral Oral  SpO2: 95% 94% 92% 92%  Weight:      Height:       SpO2: 92 % O2 Flow Rate (L/min): 2 L/min   Intake/Output Summary  (Last 24 hours) at 01/04/2023 0653 Last data filed at 01/04/2023 0600 Gross per 24 hour  Intake 712.05 ml  Output --  Net 712.05 ml   Filed Weights   01/03/23 1524  Weight: 59.9 kg    Exam: General exam: In no acute distress. Respiratory system: Good air movement and clear to auscultation. Cardiovascular system: S1 & S2 heard, RRR. No JVD.  Gastrointestinal system: Abdomen is nondistended, soft and nontender.  Central nervous system: Awake and oriented x 3 able to carry on a good conversation.  No focal deficits. Extremities: No pedal edema. Skin: No rashes, lesions or ulcers Psychiatry: Judgement and insight appear normal. Mood & affect appropriate.    Data Reviewed:    Labs: Basic Metabolic Panel: Recent Labs  Lab 12/28/22 0729 12/29/22 0418 12/31/22 1303 01/03/23 1717 01/04/23 0015  NA 132* 133* 136 134* 134*  K 3.1* 2.7* 3.4* 4.6 3.7  CL 95* 99 97* 99 104  CO2 29 30 34* 26 25  GLUCOSE 122* 100* 107* 95 108*  BUN 13 10 14 14 13   CREATININE 1.04* 0.98 1.09* 1.08* 0.90  CALCIUM 9.4 8.7* 10.3 9.2 8.4*  MG 2.2 2.0  --   --   --   PHOS 2.6 3.1  --   --   --    GFR Estimated Creatinine Clearance: 59.1 mL/min (by C-G formula based on SCr of 0.9 mg/dL). Liver Function Tests: Recent Labs  Lab 12/28/22 0729 12/29/22 0418 12/31/22 1303 01/03/23 1717  AST 90* 77* 102* 163*  ALT 41 35 40 63*  ALKPHOS 234* 228* 384* 444*  BILITOT 0.8 1.0 1.1 1.8*  PROT 5.3* 5.0* 6.2* 5.5*  ALBUMIN 2.3* 2.1* 3.1* 2.2*   No results for input(s): "LIPASE", "AMYLASE" in the last 168 hours. Recent Labs  Lab 12/28/22 0729 01/04/23 0015  AMMONIA 34 31   Coagulation profile Recent Labs  Lab 01/03/23 1602  INR 1.3*   COVID-19 Labs  No results for input(s): "DDIMER", "FERRITIN", "LDH", "CRP" in the last 72 hours.  Lab Results  Component Value Date   SARSCOV2NAA NEGATIVE 12/27/2022    CBC: Recent Labs  Lab 12/28/22 0729 12/29/22 0418 12/31/22 1303 01/03/23 1602  01/04/23 0015  WBC 5.4 5.4 7.8 7.2 5.7  NEUTROABS  --   --  6.2 5.6  --   HGB 9.1* 9.0* 10.7* 10.5* 8.0*  HCT 27.6* 26.3* 32.3* 31.6* 24.4*  MCV 94.8 93.6 93.9 95.8 97.2  PLT 227 223 301 256 182   Cardiac Enzymes: No results for input(s): "CKTOTAL", "CKMB", "CKMBINDEX", "TROPONINI" in the last 168 hours. BNP (last 3 results) No results for input(s): "PROBNP" in the last 8760 hours. CBG: No results for input(s): "GLUCAP" in the last 168 hours. D-Dimer: No results for input(s): "DDIMER" in the last 72 hours. Hgb A1c: No results for input(s): "HGBA1C" in the last 72 hours. Lipid Profile: No results for input(s): "CHOL", "HDL", "LDLCALC", "TRIG", "CHOLHDL", "LDLDIRECT" in the last 72 hours. Thyroid function  studies: Recent Labs    01/03/23 1956  TSH 1.300   Anemia work up: No results for input(s): "VITAMINB12", "FOLATE", "FERRITIN", "TIBC", "IRON", "RETICCTPCT" in the last 72 hours. Sepsis Labs: Recent Labs  Lab 12/29/22 0418 12/31/22 1303 01/03/23 1602 01/03/23 1630 01/03/23 1846 01/04/23 0015  WBC 5.4 7.8 7.2  --   --  5.7  LATICACIDVEN  --   --   --  2.2* 1.7  --    Microbiology Recent Results (from the past 240 hour(s))  Resp panel by RT-PCR (RSV, Flu A&B, Covid) Anterior Nasal Swab     Status: None   Collection Time: 12/27/22 12:30 AM   Specimen: Anterior Nasal Swab  Result Value Ref Range Status   SARS Coronavirus 2 by RT PCR NEGATIVE NEGATIVE Final    Comment: (NOTE) SARS-CoV-2 target nucleic acids are NOT DETECTED.  The SARS-CoV-2 RNA is generally detectable in upper respiratory specimens during the acute phase of infection. The lowest concentration of SARS-CoV-2 viral copies this assay can detect is 138 copies/mL. A negative result does not preclude SARS-Cov-2 infection and should not be used as the sole basis for treatment or other patient management decisions. A negative result may occur with  improper specimen collection/handling, submission of  specimen other than nasopharyngeal swab, presence of viral mutation(s) within the areas targeted by this assay, and inadequate number of viral copies(<138 copies/mL). A negative result must be combined with clinical observations, patient history, and epidemiological information. The expected result is Negative.  Fact Sheet for Patients:  BloggerCourse.com  Fact Sheet for Healthcare Providers:  SeriousBroker.it  This test is no t yet approved or cleared by the [place] FDA and  has been authorized for detection and/or diagnosis of SARS-CoV-2 by FDA under an Emergency Use Authorization (EUA). This EUA will remain  in effect (meaning this test can be used) for the duration of the COVID-19 declaration under Section 564(b)(1) of the Act, 21 U.S.C.section 360bbb-3(b)(1), unless the authorization is terminated  or revoked sooner.       Influenza A by PCR NEGATIVE NEGATIVE Final   Influenza B by PCR NEGATIVE NEGATIVE Final    Comment: (NOTE) The Xpert Xpress SARS-CoV-2/FLU/RSV plus assay is intended as an aid in the diagnosis of influenza from Nasopharyngeal swab specimens and should not be used as a sole basis for treatment. Nasal washings and aspirates are unacceptable for Xpert Xpress SARS-CoV-2/FLU/RSV testing.  Fact Sheet for Patients: BloggerCourse.com  Fact Sheet for Healthcare Providers: SeriousBroker.it  This test is not yet approved or cleared by the [place] FDA and has been authorized for detection and/or diagnosis of SARS-CoV-2 by FDA under an Emergency Use Authorization (EUA). This EUA will remain in effect (meaning this test can be used) for the duration of the COVID-19 declaration under Section 564(b)(1) of the Act, 21 U.S.C. section 360bbb-3(b)(1), unless the authorization is terminated or revoked.     Resp Syncytial Virus by PCR NEGATIVE NEGATIVE Final     Comment: (NOTE) Fact Sheet for Patients: BloggerCourse.com  Fact Sheet for Healthcare Providers: SeriousBroker.it  This test is not yet approved or cleared by the [place] FDA and has been authorized for detection and/or diagnosis of SARS-CoV-2 by FDA under an Emergency Use Authorization (EUA). This EUA will remain in effect (meaning this test can be used) for the duration of the COVID-19 declaration under Section 564(b)(1) of the Act, 21 U.S.C. section 360bbb-3(b)(1), unless the authorization is terminated or revoked.  Performed at [facility], [address] [place], [place] [address]   Blood Culture (routine x 2)     Status: None   Collection Time: 12/27/22 10:46 PM   Specimen: BLOOD  Result Value Ref Range Status   Specimen Description BLOOD SITE NOT SPECIFIED  Final   Special Requests   Final    BOTTLES DRAWN AEROBIC AND ANAEROBIC Blood Culture adequate volume   Culture   Final    NO GROWTH 5 DAYS Performed at [facility], [address] [place], [place] [address]    Report Status 01/02/2023 FINAL  Final  Blood Culture (routine x 2)     Status: None   Collection Time: 12/28/22 12:07 AM   Specimen: BLOOD RIGHT ARM  Result Value Ref Range Status   Specimen Description BLOOD RIGHT ARM  Final   Special Requests   Final    BOTTLES DRAWN AEROBIC AND ANAEROBIC Blood Culture results may not be optimal due to an inadequate volume of blood received in culture bottles   Culture   Final    NO GROWTH 5 DAYS Performed at [facility], [address], [place], [place] [address]    Report Status 01/02/2023 FINAL  Final     Medications:    busPIRone  15 mg Oral BID   dronabinol  5 mg Oral Q breakfast   And   dronabinol  2.5 mg Oral Q supper   enoxaparin (LOVENOX) injection  40 mg Subcutaneous Q24H   famotidine  20 mg Oral QAC breakfast   pantoprazole  40 mg Oral QAC breakfast    potassium chloride SA  20 mEq Oral BID   pregabalin  75 mg Oral TID   rosuvastatin  10 mg Oral QHS   senna-docusate  1 tablet Oral BID   zolpidem  5 mg Oral QHS   Continuous Infusions:  sodium chloride 100 mL/hr at 01/03/23 2249      LOS: 0 days   Marinda Elk  [facility]  01/04/2023, 6:53 AM

## 2023-01-04 NOTE — Consult Note (Signed)
[facility] ADMISSION NOTE  Patient Care Team: [name], [name] as PCP - General (Family Medicine) Pickenpack-Cousar, Arty Baumgartner, NP as Nurse Practitioner [facility] and Palliative Medicine)   ASSESSMENT & PLAN:  57 year old female with stage IV melanoma currently undergoing immunotherapy presented with fatigue, decreased oral intake and lethargy.  Acute metabolic encephalopathy Improved on hydration Previously dehydration with morphine.  Likely multifactorial.  Last MRI did not show any tumor effect such as edema. Discuss with patient and husband will omit night time Lyrica while on Ambien. Communicated with pharmacist and IM attending Will discontinue morphine and use prn for pain control now pain has improved.  Renal insufficiency Improved on hydration overnight  Decreased appetite Agree with Marinol twice daily  Stage IV melanoma Post cycle 2 of ipi/Nivo  PT when able.  Code Status Full code  Discharge planning Will hydrate today and monitor clinical status. If continues to improve tomorrow will discharge  All questions were answered.  Communicated with husband   Melven Sartorius, MD 01/04/2023 8:49 AM   CHIEF COMPLAINTS/PURPOSE OF ADMISSION Melanoma with confusion  HISTORY OF PRESENTING ILLNESS:  Laura [name] 57 y.o. female is admitted for confusion.  Report of she was using Lyrica, Ambien along with morphine.  She will help with some confusion per hospital.  She was brought in and received IV fluid.  Creatinine improved overnight and confusion resolved.  Oral intake is not great.  Food does not taste good.  Marinol does help.  Review of system was otherwise negative as below.  Report her back pain is improved.  She has not used any Dilaudid over the weekend.  Summary of oncologic history as follows: Oncology History  Metastatic malignant melanoma (HCC)  11/16/2022 Imaging   She presented to the emergency room on 11/16/2022 for back pain x 2  weeks and central chest pain for several months   CTA CAP IMPRESSION: 1. Negative for aortic dissection or aneurysm. 2. 1.2 cm left upper lobe nodule.  For 3. Multiple ill-defined liver lesions, suspicious for metastatic disease. 4. Multiple lytic osseous lesions, suspicious for metastatic disease. Pathologic fracture of the superior endplate of L5   11/25/2022 Pathology Results   1. Liver, needle/core biopsy, Left hepatic lobe lesion :       - METASTATIC MELANOMA.    12/04/2022 Initial Diagnosis   Melanoma of skin (HCC) Liver, bone metastases. 1.2 cm LUL nodule   12/09/2022 PET scan   PET: Diffuse bone and liver metastases.  Lung metastases.    12/11/2022 -  Chemotherapy   Patient is on Treatment Plan : MELANOMA Nivolumab (1) + Ipilimumab (3) q21d / Nivolumab (480) q28d     12/17/2022 Imaging   MRI brain 1. Three small right hemisphere brain metastases metastases ranging from 2 mm to 4 mm. Minimal hemosiderin. No associated cerebral edema or mass effect. No other metastatic disease identified in the brain.   2. Known osseous metastatic disease but no destructive lesion identified in the skull or visible cervical spine.   3. Moderate for age cerebral white matter changes most commonly due to small vessel disease.     MEDICAL HISTORY:  Past Medical History:  Diagnosis Date   Anxiety    Arthritis    Complication of anesthesia    Fibromyalgia    Gallstones 04/20/2018   GERD (gastroesophageal reflux disease)    History of blood transfusion    post Hysterectomy   Hypercholesteremia    Melanoma (HCC)    on back  and excised   Migraine    PONV (postoperative nausea and vomiting)    Tobacco abuse    Vertebral artery disease (HCC)    history of left vertebral artery dissection 2011    SURGICAL HISTORY: Past Surgical History:  Procedure Laterality Date   ABDOMINAL HYSTERECTOMY     ANTERIOR FUSION CERVICAL SPINE  2008   BONE GRAFT HIP ILIAC CREST Left    Left Hip to  Left Wrist   CHOLECYSTECTOMY N/A 04/20/2018   Procedure: LAPAROSCOPIC CHOLECYSTECTOMY;  Surgeon: Claud Kelp, MD;  Location: [facility] OR;  Service: General;  Laterality: N/A;   RADIOLOGY WITH ANESTHESIA N/A 12/17/2022   Procedure: MRI BRAIN WITH AND WITHOUT CONTRAST, MRI LUMBAR SPINE WITH AND WITHOUT CONTRAST WITH ANESTHESIA;  Surgeon: Radiologist, Medication, MD;  Location: [facility] OR;  Service: Radiology;  Laterality: N/A;   thumb surgery Left 2018   [facility]- Tendons Carpal tunnel   WRIST SURGERY  1994    SOCIAL HISTORY: Social History   Socioeconomic History   Marital status: Married    Spouse name: Not on file   Number of children: Not on file   Years of education: Not on file   Highest education level: Not on file  Occupational History   Not on file  Tobacco Use   Smoking status: Former    Current packs/day: 0.00    Average packs/day: 1 pack/day for 20.0 years (20.0 ttl pk-yrs)    Types: Cigarettes    Start date: 2    Quit date: 2018    Years since quitting: 6.9   Smokeless tobacco: Never  Vaping Use   Vaping status: Every Day   Substances: Nicotine  Substance and Sexual Activity   Alcohol use: No    Alcohol/week: 0.0 standard drinks of alcohol   Drug use: No   Sexual activity: Not on file  Other Topics Concern   Not on file  Social History Narrative   Married,  3 kids.   Lives home with husband,  Works at [employer].  Education 12th grade.  Caffeine pepsi daily and tea daily.   '   Social Determinants of Health   Financial Resource Strain: Not on file  Food Insecurity: No Food Insecurity (01/03/2023)   Hunger Vital Sign    Worried About Running Out of Food in the Last Year: Never true    Ran Out of Food in the Last Year: Never true  Transportation Needs: No Transportation Needs (01/03/2023)   PRAPARE - Administrator, Civil Service (Medical): No    Lack of Transportation (Non-Medical): No  Physical Activity: Not on file  Stress: Not on file  Social  Connections: Not on file  Intimate Partner Violence: Not At Risk (01/03/2023)   Humiliation, Afraid, Rape, and Kick questionnaire    Fear of Current or Ex-Partner: No    Emotionally Abused: No    Physically Abused: No    Sexually Abused: No    FAMILY HISTORY: Family History  Problem Relation Age of Onset   Lung cancer Father        smoking hx   Breast cancer Sister        diagnosed over the age of 53   Breast cancer Sister        diagnosed over the age of 35    ALLERGIES:  is allergic to aspirin.  MEDICATIONS:  Current Facility-Administered Medications  Medication Dose Route Frequency Provider Last Rate Last Admin   0.9 %  sodium chloride infusion  Intravenous Continuous Marinda Elk, MD       acetaminophen (TYLENOL) tablet 650 mg  650 mg Oral Q6H PRN Hillary Bow, DO       Or   acetaminophen (TYLENOL) suppository 650 mg  650 mg Rectal Q6H PRN Hillary Bow, DO       busPIRone (BUSPAR) tablet 15 mg  15 mg Oral BID Lyda Perone M, DO   15 mg at 01/03/23 2247   dronabinol (MARINOL) capsule 5 mg  5 mg Oral Q breakfast Hillary Bow, DO   5 mg at 01/04/23 1610   And   dronabinol (MARINOL) capsule 2.5 mg  2.5 mg Oral Q supper Hillary Bow, DO       enoxaparin (LOVENOX) injection 40 mg  40 mg Subcutaneous Q24H Lyda Perone M, DO   40 mg at 01/03/23 2246   famotidine (PEPCID) tablet 20 mg  20 mg Oral QAC breakfast Hillary Bow, DO   20 mg at 01/04/23 9604   HYDROmorphone (DILAUDID) tablet 2 mg  2 mg Oral Q6H PRN Hillary Bow, DO   2 mg at 01/04/23 0839   [START ON 01/05/2023] morphine (MS CONTIN) 12 hr tablet 15 mg  15 mg Oral Q12H Marinda Elk, MD       ondansetron [facility]) tablet 4 mg  4 mg Oral Q6H PRN Hillary Bow, DO       Or   ondansetron [facility]) injection 4 mg  4 mg Intravenous Q6H PRN Hillary Bow, DO       pantoprazole (PROTONIX) EC tablet 40 mg  40 mg Oral QAC breakfast Lyda Perone M, DO   40 mg at 01/04/23 5409    potassium chloride SA (KLOR-CON M) CR tablet 20 mEq  20 mEq Oral BID Hillary Bow, DO   20 mEq at 01/03/23 2247   pregabalin (LYRICA) capsule 75 mg  75 mg Oral TID Hillary Bow, DO   75 mg at 01/03/23 2247   rosuvastatin (CRESTOR) tablet 10 mg  10 mg Oral QHS Hillary Bow, DO   10 mg at 01/03/23 2247   senna-docusate (Senokot-S) tablet 1 tablet  1 tablet Oral BID Hillary Bow, DO   1 tablet at 01/03/23 2247   zolpidem (AMBIEN) tablet 5 mg  5 mg Oral QHS Lyda Perone M, DO   5 mg at 01/03/23 2247    REVIEW OF SYSTEMS:   Constitutional: Denies fevers, positive for loss of appetite Respiratory: Denies cough, shortness of breath or wheezes Cardiovascular: Denies chest discomfort or chest pain Gastrointestinal:  Denies nausea, vomiting, diarrhea, constipation, bleeding or abdominal pain GU: Denies any difficulty urinating and reported good urine output. Neurological: Denies headaches with nausea vomiting, focal weakness  All other systems were reviewed with the patient and are negative.  PHYSICAL EXAMINATION: ECOG PERFORMANCE STATUS: 2 - Symptomatic, <50% confined to bed  Vitals:   01/04/23 0347 01/04/23 0729  BP: 109/65 120/71  Pulse: (!) 117 (!) 125  Resp: 18 17  Temp: 98.5 F (36.9 C) 98.4 F (36.9 C)  SpO2: 92% 95%   Filed Weights   01/03/23 1524  Weight: 132 lb (59.9 kg)    GENERAL:alert, no distress and comfortable SKIN: skin color normal. No jaundice EYES:  sclera clear OROPHARYNX: no exudate NECK: supple. No mass LYMPH:  no palpable cervical lymphadenopathy LUNGS: clear to auscultation and normal breathing effort.  No wheeze or rales HEART: regular rate & rhythm and no murmurs  ABDOMEN:abdomen soft, non-tender and non-distended Musculoskeletal:  no lower extremity edema NEURO: alert & oriented x 3 with fluent speech; no focal motor/sensory deficits Strength and sensation equal bilaterally  LABORATORY DATA:  I have reviewed the data as listed Lab  Results  Component Value Date   WBC 5.7 01/04/2023   HGB 8.0 (L) 01/04/2023   HCT 24.4 (L) 01/04/2023   MCV 97.2 01/04/2023   PLT 182 01/04/2023   Recent Labs    12/29/22 0418 12/31/22 1303 01/03/23 1717 01/04/23 0015  NA 133* 136 134* 134*  K 2.7* 3.4* 4.6 3.7  CL 99 97* 99 104  CO2 30 34* 26 25  GLUCOSE 100* 107* 95 108*  BUN 10 14 14 13   CREATININE 0.98 1.09* 1.08* 0.90  CALCIUM 8.7* 10.3 9.2 8.4*  GFRNONAA >60 >60 >60 >60  PROT 5.0* 6.2* 5.5*  --   ALBUMIN 2.1* 3.1* 2.2*  --   AST 77* 102* 163*  --   ALT 35 40 63*  --   ALKPHOS 228* 384* 444*  --   BILITOT 1.0 1.1 1.8*  --     RADIOGRAPHIC STUDIES: I have personally reviewed the radiological images as listed and agreed with the findings in the report. DG Chest 2 View  Result Date: 01/03/2023 CLINICAL DATA:  Sepsis EXAM: CHEST - 2 VIEW COMPARISON:  12/27/2022 FINDINGS: The heart size and mediastinal contours are within normal limits. Trace pleural effusions. Unchanged left upper lobe lung nodule. The visualized skeletal structures are unremarkable. IMPRESSION: 1. Trace pleural effusions. No focal airspace opacity. 2.  Unchanged left upper lobe lung nodule. Electronically Signed   By: [name]   On: 01/03/2023 15:59   CT Angio Chest PE W and/or Wo Contrast  Result Date: 12/28/2022 CLINICAL DATA:  Patient with metastatic melanoma, new transaminitis, pulmonary embolism suspected. EXAM: CT ANGIOGRAPHY CHEST CT ABDOMEN AND PELVIS WITH CONTRAST TECHNIQUE: Multidetector CT imaging of the chest was performed using the standard protocol during bolus administration of intravenous contrast. Multiplanar CT image reconstructions and MIPs were obtained to evaluate the vascular anatomy. Multidetector CT imaging of the abdomen and pelvis was performed using the standard protocol during bolus administration of intravenous contrast. RADIATION DOSE REDUCTION: This exam was performed according to the departmental dose-optimization  program which includes automated exposure control, adjustment of the mA and/or kV according to patient size and/or use of iterative reconstruction technique. CONTRAST:  OMNIPAQUE IOHEXOL 350 MG/ML SOLN COMPARISON:  PET-CT recently 12/09/2022, and CTA chest, abdomen and pelvis 11/16/2022. FINDINGS: CTA CHEST FINDINGS Cardiovascular: The cardiac size is normal. No arterial dilatation or embolism is seen. Minimal pericardial effusion anteriorly. Scattered calcific plaques lad coronary artery. Mild aortic atherosclerosis. There is no aneurysm, stenosis or dissection. The great vessels are widely patent. Pulmonary veins are nondistended. Mediastinum/Nodes: Enlarged superior left hilar lymph node is 1.3 cm in short axis, previously 1.1 cm. No other intrathoracic adenopathy is seen. 1.2 cm hypodense nodule in the inferior pole of the left lobe of the thyroid gland is unchanged. No follow-up imaging is recommended. Lungs/Pleura: 1.5 cm pulmonary metastasis left upper lobe, level of the aortic arch, unchanged from the PET-CT, 11/16/2022 was 1.2 cm. There are several new nodules in the right middle lobe, most are 3 mm or smaller for example on series 13 axial images 80 6-99. Largest of these is 5 mm anteriorly on 13:90, concerning for new metastases. In the right lower lobe there is a new 5 mm nodule about the fissure on  13:90. Mild paraseptal emphysema again seen in the lung apices. There are coarse atelectatic bands in the posterior bases. On the left there is increased conspicuity of fissural nodules in the lower lung field, largest of these is about 3 mm. There is a new nodule in the left lower lobe measuring 5 mm on 13:99 and a new 3 mm apical left lung nodule on 13:44. There is no pleural effusion, no other new lung abnormality. No focal consolidation. Musculoskeletal: There is chronic interbody fusion hardware in the lower cervical spine. Multifocal lytic metastatic disease particularly in the thoracic spine but  also in the ribs and sternum, with pathologic compression fracture of the T3 vertebral body, worsening lytic metastasis T4 body with extension into the spinal canal and increased erosion through the posterior cortex, with increased soft tissue infiltration into the left T3-4 foramen. Similar increased posterior cortical erosion and metastatic extension into the spinal canal at T8. Mass effect on the cord could be present at these levels. Review of the MIP images confirms the above findings. CT ABDOMEN and PELVIS FINDINGS Hepatobiliary: Innumerable diffuse metastases to the liver largest is in segment 2, today 7.5 cm, on the PET-CT 7.4 cm, the next largest is medially in segment 5 measuring 3.9 cm, previously 3.3 cm. Gallbladder is absent without bile duct dilatation. Pancreas: No abnormality. Spleen: New scattered nonspecific subcentimeter hypodensities are noted in the inferior aspect of the spleen, possible metastases. Largest is 8 mm on 19:24. Adrenals/Urinary Tract: Stable 1 cm left adrenal nodule, was not hypermetabolic on PET-CT. No right adrenal or broad or renal mass. No urinary stone or obstruction. Unremarkable bladder for the degree of distention. Stomach/Bowel: Unremarkable contracted stomach. Normal caliber unopacified small bowel. Normal appendix. Moderate fecal stasis ascending and transverse colon. Distal descending colon and sigmoid either demonstrating mild wall thickening or underdistention. Unremarkable rectum. Vascular/Lymphatic: Aortic atherosclerosis. No enlarged abdominal or pelvic lymph nodes. Reproductive: Status post hysterectomy. No adnexal masses. Multiple pelvic phleboliths. Other: No ascites or free air, no incarcerated hernia. Musculoskeletal: Progressive lytic metastatic disease, including compared with 12/09/2022, is noted at multiple levels of the lumbar spine, throughout the sacrum and bony pelvis, and with multiple small lytic lesions in both proximal femurs. There is increased  pathologic compression fracture of the L5 vertebral body with soft tissue herniation into the spinal canal severely narrowing the spinal canal from the L4-5 to the L5-S1 level. There is an enhancing mass in the right dorsal paraspinous musculature at L2-3 measuring 2.3 cm and was seen on 12/09/2022. Destructive mass of S3 to the left today measures 3 cm, previously 2.6 cm. Review of the MIP images confirms the above findings. IMPRESSION: 1. No evidence of arterial dilatation or embolism. 2. 1.5 cm pulmonary metastasis left upper lobe, unchanged from the PET-CT, 1.2 cm on 11/16/2022. 3. Multiple new nodules in the right middle lobe, right lower lobe, and left apical lung, largest 5 mm, concerning for new metastases. 4. Increased conspicuity of fissural nodules in the left lower lung field. 5. Increased size of a left hilar lymph node. 6. Innumerable diffuse metastases to the liver, largest 7.5 cm in segment 2, previously 7.4 cm. 7. New scattered subcentimeter hypodensities in the inferior spleen, possible metastases. 8. Progressive lytic metastatic disease throughout the spine, sacrum, bony pelvis, and proximal femurs. 9. Increased pathologic compression fracture of the T3 and L5 vertebral body with soft tissue herniation into the spinal canal severely narrowing the spinal canal from the L4-5 to the L5-S1 level, and in  the thoracic spine with increased soft tissue infiltration of the spinal canal at T4 and T8, and in the left T3-4 foramen. 10. Stable 2.3 cm enhancing mass in the right dorsal paraspinous musculature at L2-3. 11. Constipation. 12. Distal descending colon and sigmoid either demonstrating mild wall thickening or underdistention. Correlate clinically for underlying colitis. 13. Aortic and coronary artery atherosclerosis. Aortic Atherosclerosis (ICD10-I70.0) and Emphysema (ICD10-J43.9). Electronically Signed   By: Almira Bar M.D.   On: 12/28/2022 02:39   CT ABDOMEN PELVIS W CONTRAST  Result Date:  12/28/2022 CLINICAL DATA:  Patient with metastatic melanoma, new transaminitis, pulmonary embolism suspected. EXAM: CT ANGIOGRAPHY CHEST CT ABDOMEN AND PELVIS WITH CONTRAST TECHNIQUE: Multidetector CT imaging of the chest was performed using the standard protocol during bolus administration of intravenous contrast. Multiplanar CT image reconstructions and MIPs were obtained to evaluate the vascular anatomy. Multidetector CT imaging of the abdomen and pelvis was performed using the standard protocol during bolus administration of intravenous contrast. RADIATION DOSE REDUCTION: This exam was performed according to the departmental dose-optimization program which includes automated exposure control, adjustment of the mA and/or kV according to patient size and/or use of iterative reconstruction technique. CONTRAST:  OMNIPAQUE IOHEXOL 350 MG/ML SOLN COMPARISON:  PET-CT recently 12/09/2022, and CTA chest, abdomen and pelvis 11/16/2022. FINDINGS: CTA CHEST FINDINGS Cardiovascular: The cardiac size is normal. No arterial dilatation or embolism is seen. Minimal pericardial effusion anteriorly. Scattered calcific plaques lad coronary artery. Mild aortic atherosclerosis. There is no aneurysm, stenosis or dissection. The great vessels are widely patent. Pulmonary veins are nondistended. Mediastinum/Nodes: Enlarged superior left hilar lymph node is 1.3 cm in short axis, previously 1.1 cm. No other intrathoracic adenopathy is seen. 1.2 cm hypodense nodule in the inferior pole of the left lobe of the thyroid gland is unchanged. No follow-up imaging is recommended. Lungs/Pleura: 1.5 cm pulmonary metastasis left upper lobe, level of the aortic arch, unchanged from the PET-CT, 11/16/2022 was 1.2 cm. There are several new nodules in the right middle lobe, most are 3 mm or smaller for example on series 13 axial images 80 6-99. Largest of these is 5 mm anteriorly on 13:90, concerning for new metastases. In the right lower lobe  there is a new 5 mm nodule about the fissure on 13:90. Mild paraseptal emphysema again seen in the lung apices. There are coarse atelectatic bands in the posterior bases. On the left there is increased conspicuity of fissural nodules in the lower lung field, largest of these is about 3 mm. There is a new nodule in the left lower lobe measuring 5 mm on 13:99 and a new 3 mm apical left lung nodule on 13:44. There is no pleural effusion, no other new lung abnormality. No focal consolidation. Musculoskeletal: There is chronic interbody fusion hardware in the lower cervical spine. Multifocal lytic metastatic disease particularly in the thoracic spine but also in the ribs and sternum, with pathologic compression fracture of the T3 vertebral body, worsening lytic metastasis T4 body with extension into the spinal canal and increased erosion through the posterior cortex, with increased soft tissue infiltration into the left T3-4 foramen. Similar increased posterior cortical erosion and metastatic extension into the spinal canal at T8. Mass effect on the cord could be present at these levels. Review of the MIP images confirms the above findings. CT ABDOMEN and PELVIS FINDINGS Hepatobiliary: Innumerable diffuse metastases to the liver largest is in segment 2, today 7.5 cm, on the PET-CT 7.4 cm, the next largest is medially in  segment 5 measuring 3.9 cm, previously 3.3 cm. Gallbladder is absent without bile duct dilatation. Pancreas: No abnormality. Spleen: New scattered nonspecific subcentimeter hypodensities are noted in the inferior aspect of the spleen, possible metastases. Largest is 8 mm on 19:24. Adrenals/Urinary Tract: Stable 1 cm left adrenal nodule, was not hypermetabolic on PET-CT. No right adrenal or broad or renal mass. No urinary stone or obstruction. Unremarkable bladder for the degree of distention. Stomach/Bowel: Unremarkable contracted stomach. Normal caliber unopacified small bowel. Normal appendix. Moderate  fecal stasis ascending and transverse colon. Distal descending colon and sigmoid either demonstrating mild wall thickening or underdistention. Unremarkable rectum. Vascular/Lymphatic: Aortic atherosclerosis. No enlarged abdominal or pelvic lymph nodes. Reproductive: Status post hysterectomy. No adnexal masses. Multiple pelvic phleboliths. Other: No ascites or free air, no incarcerated hernia. Musculoskeletal: Progressive lytic metastatic disease, including compared with 12/09/2022, is noted at multiple levels of the lumbar spine, throughout the sacrum and bony pelvis, and with multiple small lytic lesions in both proximal femurs. There is increased pathologic compression fracture of the L5 vertebral body with soft tissue herniation into the spinal canal severely narrowing the spinal canal from the L4-5 to the L5-S1 level. There is an enhancing mass in the right dorsal paraspinous musculature at L2-3 measuring 2.3 cm and was seen on 12/09/2022. Destructive mass of S3 to the left today measures 3 cm, previously 2.6 cm. Review of the MIP images confirms the above findings. IMPRESSION: 1. No evidence of arterial dilatation or embolism. 2. 1.5 cm pulmonary metastasis left upper lobe, unchanged from the PET-CT, 1.2 cm on 11/16/2022. 3. Multiple new nodules in the right middle lobe, right lower lobe, and left apical lung, largest 5 mm, concerning for new metastases. 4. Increased conspicuity of fissural nodules in the left lower lung field. 5. Increased size of a left hilar lymph node. 6. Innumerable diffuse metastases to the liver, largest 7.5 cm in segment 2, previously 7.4 cm. 7. New scattered subcentimeter hypodensities in the inferior spleen, possible metastases. 8. Progressive lytic metastatic disease throughout the spine, sacrum, bony pelvis, and proximal femurs. 9. Increased pathologic compression fracture of the T3 and L5 vertebral body with soft tissue herniation into the spinal canal severely narrowing the spinal  canal from the L4-5 to the L5-S1 level, and in the thoracic spine with increased soft tissue infiltration of the spinal canal at T4 and T8, and in the left T3-4 foramen. 10. Stable 2.3 cm enhancing mass in the right dorsal paraspinous musculature at L2-3. 11. Constipation. 12. Distal descending colon and sigmoid either demonstrating mild wall thickening or underdistention. Correlate clinically for underlying colitis. 13. Aortic and coronary artery atherosclerosis. Aortic Atherosclerosis (ICD10-I70.0) and Emphysema (ICD10-J43.9). Electronically Signed   By: Almira Bar M.D.   On: 12/28/2022 02:39   CT HEAD WO CONTRAST ( )  Result Date: 12/27/2022 CLINICAL DATA:  Delirium.  History of brain metastases. EXAM: CT HEAD WITHOUT CONTRAST TECHNIQUE: Contiguous axial images were obtained from the base of the skull through the vertex without intravenous contrast. RADIATION DOSE REDUCTION: This exam was performed according to the departmental dose-optimization program which includes automated exposure control, adjustment of the mA and/or kV according to patient size and/or use of iterative reconstruction technique. COMPARISON:  Head CT 11/11/2022.  MRI brain 12/17/2022. FINDINGS: Brain: No evidence of acute infarction, hemorrhage, hydrocephalus, or extra-axial fluid collection. There are 2 rounded hyperdense metastatic lesions in the right frontal cortex seen best on coronal imaging. These are unchanged from prior MRI. There is also 2 mm hyperdense  metastatic lesion in the right occipital region best seen on coronal image 5/46. This is not well appreciated on prior MRI. There is no mass effect. Minimal patchy periventricular white matter hypodensity appears similar to prior. Vascular: No hyperdense vessel or unexpected calcification. Skull: Normal. Negative for fracture or focal lesion. Sinuses/Orbits: No acute finding. Other: None. IMPRESSION: 1. No acute intracranial process. 2. Two stable rounded hyperdense  metastatic lesions in the right frontal cortex. There is also a 2 mm hyperdense metastatic lesion in the right occipital region which is not well appreciated on prior MRI. No mass effect. Electronically Signed   By: Darliss Cheney M.D.   On: 12/27/2022 23:20   DG Chest Port 1 View  Result Date: 12/27/2022 CLINICAL DATA:  Questionable sepsis - evaluate for abnormality EXAM: PORTABLE CHEST 1 VIEW COMPARISON:  Chest x-ray 11/16/2022, CT angio chest 11/16/2022, PET CT 12/09/2022 FINDINGS: The heart and mediastinal contours are within normal limits. Aortic calcification. Redemonstration of an approximate 1.6 cm left upper quadrant pulmonary better evaluated on PET-CT 12/09/2022. No focal consolidation. No pulmonary edema. No pleural effusion. No pneumothorax. No acute osseous abnormality.  Cervical spine surgical hardware. IMPRESSION: 1. No acute cardiopulmonary abnormality. 2. Redemonstration of an approximate 1.6 cm left upper quadrant pulmonary better evaluated on PET-CT 12/09/2022. 3.  Aortic Atherosclerosis (ICD10-I70.0). Electronically Signed   By: Tish Frederickson M.D.   On: 12/27/2022 23:05   MR BRAIN W WO CONTRAST  Addendum Date: 12/17/2022   ADDENDUM REPORT: 12/17/2022 13:41 ADDENDUM: Study discussed by telephone with [name] COURTNEY WHARTON on 12/17/2022 at 1332 hours. Electronically Signed   By: Odessa Fleming M.D.   On: 12/17/2022 13:41   Result Date: 12/17/2022 CLINICAL DATA:  57 year old female with metastatic melanoma. Staging. Study under anesthesia. EXAM: MRI HEAD WITHOUT AND WITH CONTRAST TECHNIQUE: Multiplanar, multiecho pulse sequences of the brain and surrounding structures were obtained without and with intravenous contrast. CONTRAST:  5mL GADAVIST GADOBUTROL 1 MMOL/ML IV SOLN COMPARISON:  PET-CT 12/09/2022.  Head CT 11/11/2022. FINDINGS: Brain: Several small intrinsic T1 hyperintense (series 8, image 37) and/or enhancing (series 19, image 30) brain lesions compatible with small melanoma  metastases. Only 1 of these demonstrates evidence of hemosiderin on SWI (series 7, image 72). No associated cerebral edema or mass effect. Three such small metastases are identified including 3-4 mm at the right inferior frontal gyrus on series 19, image 30, 3-4 mm at the right operculum image 35, and 2-3 mm at the right middle frontal gyrus on image 38. Although those lesions are relatively clustered, no additional brain metastases, abnormal enhancement, or abnormal susceptibility is identified elsewhere. No dural thickening. No superimposed restricted diffusion to suggest acute infarction. No midline shift, ventriculomegaly, extra-axial collection or acute intracranial hemorrhage. Cervicomedullary junction and pituitary are within normal limits. Scattered nonenhancing white matter T2 and FLAIR hyperintensity is moderate for age, nonspecific but most commonly small vessel related. Vascular: Major intracranial vascular flow voids are preserved. Dominant distal left vertebral artery. Following contrast major dural venous sinuses are enhancing and appear to be patent. Skull and upper cervical spine: Heterogeneous background bone marrow signal. Known osseous metastatic disease, but no destructive lesion identified in the skull or visible cervical spine. Grossly negative visible cervical spinal cord. Sinuses/Orbits: Negative orbits. Minor paranasal sinus mucosal thickening. Other: Minimal left mastoid effusion. Visible internal auditory structures appear normal. Intubated, study under anesthesia. Negative visible scalp and face. IMPRESSION: 1. Three small right hemisphere brain metastases metastases ranging from 2 mm to 4 mm. Minimal hemosiderin.  No associated cerebral edema or mass effect. No other metastatic disease identified in the brain. 2. Known osseous metastatic disease but no destructive lesion identified in the skull or visible cervical spine. 3. Moderate for age cerebral white matter changes most commonly due  to small vessel disease. Electronically Signed: By: Odessa Fleming M.D. On: 12/17/2022 13:16   MR LUMBAR SPINE W WO CONTRAST  Addendum Date: 12/17/2022   ADDENDUM REPORT: 12/17/2022 13:40 ADDENDUM: Study discussed by telephone with [name] COURTNEY WHARTON on 12/17/2022 at 1332 hours. And note that CLINICAL DATA should read 57 year old female with metastatic melanoma. Staging. Study under anesthesia. Electronically Signed   By: Odessa Fleming M.D.   On: 12/17/2022 13:40   Result Date: 12/17/2022 EXAM: MRI LUMBAR SPINE WITHOUT AND WITH CONTRAST TECHNIQUE: Multiplanar and multiecho pulse sequences of the lumbar spine were obtained without and with intravenous contrast. CONTRAST:  5mL GADAVIST GADOBUTROL 1 MMOL/ML IV SOLN COMPARISON:  Brain MRI today reported separately. PET-CT 12/09/2022. CTA chest abdomen pelvis 11/16/2022. FINDINGS: Segmentation:  Normal on the comparison CTA. Alignment:  Mild straightening of lumbar lordosis now. Vertebrae: Diffuse skeletal metastatic disease throughout the visible spine and pelvis. Scout view demonstrates thoracic vertebral metastases with extension into the ventral epidural space at both the T4 and T8 levels (series 10, image 14). Pathologic fracture of the L5 vertebral body has progressed since 11/16/2022. L5 loss of height is now up to 60%, and there is retropulsed and/or epidural extension of tumor at that level (series 16, image 31), see additional spinal canal details below. Lumbar ventral epidural tumor also at the left L1 vertebral level without spinal stenosis (series 15, image 17), and at the L4 level on the right (series 15, image 25) without spinal stenosis. Incidental bulky extraosseous tumor also from the right L1 transverse process, 2.2 cm right paraspinal muscle mass there on series 15, image 7. No other pathologic fracture identified. Conus medullaris and cauda equina: Conus extends to the L1 level. No lower spinal cord or conus signal abnormality. But there is an  indistinct appearance of the surface of the lower thoracic spinal cord on T2 imaging (series 12, image 8), and indeterminate enhancement pattern at the conus (series 17, image 7). No obvious nerve root thickening or enhancement in the cauda equina. Paraspinal and other soft tissues: Right L1 paraspinal muscle tumor arising from the posterior elements at that level as above. Partially visible extensive liver tumor. No retroperitoneal, prevertebral lymphadenopathy. Disc levels: No significant lumbar spine degeneration superimposed on the diffuse metastatic disease. Ventral epidural tumor at L2 and L4 without spinal stenosis as above. Bulky tumor retropulsion, epidural extension at the L5 pathologic compression fracture level now resulting in severe spinal stenosis (series 15, image 31 and series 12, image 8), severe lateral recess stenosis (S1 nerve levels) and severe proximal L5 foraminal stenosis. IMPRESSION: 1. Diffuse skeletal metastatic disease. Involvement throughout the visible spine and pelvis. 2. Progressed pathologic fracture of the L5 since 10/21/2024with 60% loss of height and bulky retropulsed/epidural tumor. 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 3. 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 And scout view demonstrates thoracic spine ventral epidural tumor at both T4 and T8 levels. 4. 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 Electronically Signed: By: Odessa Fleming M.D. On: 12/17/2022 13:28   NM PET Image Initial (PI) Whole Body  Result Date: 12/09/2022 CLINICAL DATA:  Initial treatment strategy for metastatic melanoma. EXAM: NUCLEAR MEDICINE PET WHOLE  BODY TECHNIQUE: 6.6 mCi F-18 FDG was injected intravenously. Full-ring PET imaging was performed from the head to foot after the radiotracer. CT data was obtained and used for attenuation correction and anatomic localization. Fasting blood glucose: 115 mg/dl  COMPARISON:  CTA chest abdomen pelvis dated 11/16/2022 FINDINGS: Mediastinal blood pool activity: SUV max 2.1 HEAD/NECK: No hypermetabolic activity in the scalp. No hypermetabolic cervical lymph nodes. Incidental CT findings: none CHEST: 14 mm posterior left upper lobe pulmonary nodule (series 7/image 20), max SUV 8.7. 11 mm short axis left AP window node (series 4/image 82), max SUV 11.2. Incidental CT findings: Mild atherosclerotic calcifications of the aortic arch. Mild coronary atherosclerosis of the LAD. Mild centrilobular and paraseptal emphysematous changes, upper lung predominant. ABDOMEN/PELVIS: Multifocal hepatic metastases, approximately 10-15 in number, including: --7.4 cm mass in the lateral segment left hepatic lobe (series 4/image 112), max SUV 16.3 --2.9 cm mass in the medial aspect of segment 6 (series 4/image 122), max SUV 14.7 No abnormal hypermetabolism in the pancreas, spleen, or adrenal glands. No hypermetabolic abdominopelvic lymphadenopathy. Incidental CT findings: Status post cholecystectomy. Status post hysterectomy. Mild rectosigmoid colonic wall thickening (series 4/image 135), nonspecific. Atherosclerotic calcifications of the abdominal aorta and branch vessels. SKELETON: Multifocal/widespread osseous metastases, including: --Left humeral head, max SUV 6.4 --T4 vertebral body, max SUV 21.1 --Right lateral 6th rib, max SUV 4.2 --Right inferior sternum, max SUV 12.5 --L4 vertebral body, max SUV 16.3 --Left sacrum, max SUV 15.4 Hypermetabolism involving right anterior thigh musculature, favored to be physiologic. Incidental CT findings: none EXTREMITIES: No abnormal hypermetabolic activity in the lower extremities. Incidental CT findings: none IMPRESSION: Left upper lobe pulmonary metastasis, as above. Associated mediastinal nodal metastasis. Multifocal hepatic metastases, as above. Multifocal/widespread osseous metastases, as above. Electronically Signed   By: Charline Bills M.D.   On:  12/09/2022 23:34

## 2023-01-05 ENCOUNTER — Other Ambulatory Visit: Payer: Self-pay

## 2023-01-05 ENCOUNTER — Telehealth: Payer: Self-pay

## 2023-01-05 ENCOUNTER — Other Ambulatory Visit (HOSPITAL_COMMUNITY): Payer: Self-pay

## 2023-01-05 DIAGNOSIS — C439 Malignant melanoma of skin, unspecified: Secondary | ICD-10-CM | POA: Diagnosis not present

## 2023-01-05 DIAGNOSIS — D6481 Anemia due to antineoplastic chemotherapy: Secondary | ICD-10-CM

## 2023-01-05 DIAGNOSIS — T451X5A Adverse effect of antineoplastic and immunosuppressive drugs, initial encounter: Secondary | ICD-10-CM

## 2023-01-05 DIAGNOSIS — G893 Neoplasm related pain (acute) (chronic): Secondary | ICD-10-CM | POA: Diagnosis not present

## 2023-01-05 DIAGNOSIS — E860 Dehydration: Secondary | ICD-10-CM | POA: Diagnosis not present

## 2023-01-05 DIAGNOSIS — R000 Tachycardia, unspecified: Secondary | ICD-10-CM | POA: Diagnosis not present

## 2023-01-05 LAB — COMPREHENSIVE METABOLIC PANEL
ALT: 60 U/L — ABNORMAL HIGH (ref 0–44)
AST: 119 U/L — ABNORMAL HIGH (ref 15–41)
Albumin: 2 g/dL — ABNORMAL LOW (ref 3.5–5.0)
Alkaline Phosphatase: 411 U/L — ABNORMAL HIGH (ref 38–126)
Anion gap: 9 (ref 5–15)
BUN: 10 mg/dL (ref 6–20)
CO2: 22 mmol/L (ref 22–32)
Calcium: 9 mg/dL (ref 8.9–10.3)
Chloride: 105 mmol/L (ref 98–111)
Creatinine, Ser: 0.87 mg/dL (ref 0.44–1.00)
GFR, Estimated: >60 mL/min (ref >60)
Glucose, Bld: 96 mg/dL (ref 70–99)
Potassium: 4.2 mmol/L (ref 3.5–5.1)
Sodium: 136 mmol/L (ref 135–145)
Total Bilirubin: 1.3 mg/dL — ABNORMAL HIGH (ref <1.2)
Total Protein: 5.3 g/dL — ABNORMAL LOW (ref 6.5–8.1)

## 2023-01-05 LAB — CBC
HCT: 27.3 % — ABNORMAL LOW (ref 36.0–46.0)
Hemoglobin: 8.8 g/dL — ABNORMAL LOW (ref 12.0–15.0)
MCH: 31.8 pg (ref 26.0–34.0)
MCHC: 32.2 g/dL (ref 30.0–36.0)
MCV: 98.6 fL (ref 80.0–100.0)
Platelets: 203 10*3/uL (ref 150–400)
RBC: 2.77 MIL/uL — ABNORMAL LOW (ref 3.87–5.11)
RDW: 14.4 % (ref 11.5–15.5)
WBC: 6.6 10*3/uL (ref 4.0–10.5)
nRBC: 0.5 % — ABNORMAL HIGH (ref 0.0–0.2)

## 2023-01-05 MED ORDER — DEXAMETHASONE SODIUM PHOSPHATE 4 MG/ML IJ SOLN
4.0000 mg | Freq: Once | INTRAMUSCULAR | Status: AC
Start: 2023-01-05 — End: 2023-01-05
  Administered 2023-01-05: 4 mg via INTRAVENOUS
  Filled 2023-01-05: qty 1

## 2023-01-05 MED ORDER — PREGABALIN 75 MG PO CAPS: 75.0000 mg | ORAL_CAPSULE | Freq: Every morning | ORAL | Status: DC

## 2023-01-05 MED ORDER — TRAMADOL HCL 50 MG PO TABS
100.0000 mg | ORAL_TABLET | Freq: Four times a day (QID) | ORAL | 0 refills | Status: DC | PRN
  Filled 2023-01-05: qty 21, 4d supply, fill #0

## 2023-01-05 MED ORDER — ONDANSETRON 4 MG PO TBDP
4.0000 mg | ORAL_TABLET | Freq: Three times a day (TID) | ORAL | 0 refills | Status: DC | PRN
  Filled 2023-01-05: qty 18, 6d supply, fill #0

## 2023-01-05 MED ORDER — METOPROLOL TARTRATE 25 MG PO TABS
25.0000 mg | ORAL_TABLET | Freq: Two times a day (BID) | ORAL | 1 refills | Status: DC
  Filled 2023-01-05: qty 60, 30d supply, fill #0

## 2023-01-05 MED ORDER — TRAMADOL HCL 50 MG PO TABS: 100.0000 mg | ORAL_TABLET | Freq: Four times a day (QID) | ORAL | Status: DC | PRN

## 2023-01-05 MED ORDER — METOPROLOL TARTRATE 25 MG PO TABS
25.0000 mg | ORAL_TABLET | Freq: Two times a day (BID) | ORAL | Status: DC
  Administered 2023-01-05: 25 mg via ORAL
  Filled 2023-01-05: qty 1

## 2023-01-05 MED ORDER — KETOROLAC TROMETHAMINE 15 MG/ML IJ SOLN
15.0000 mg | Freq: Once | INTRAMUSCULAR | Status: AC
Start: 2023-01-05 — End: 2023-01-05
  Administered 2023-01-05: 15 mg via INTRAVENOUS
  Filled 2023-01-05: qty 1

## 2023-01-05 NOTE — Discharge Summary (Signed)
Physician Discharge Summary  Laura [name] WUJ:[id number] DOB: [birth year] DOA: 01/03/2023  PCP: Laura Soho, [name]  Admit date: 01/03/2023 Discharge date: 01/05/2023  Admitted From: Home Disposition:  Home  Recommendations for Outpatient Follow-up:  Follow up with Oncology in 1-2 weeks Please obtain BMP/CBC in one week   Home Health:No Equipment/Devices:None  Discharge Condition:Stable CODE STATUS:Full Diet recommendation: Heart Healthy   Brief/Interim Summary: 57 y.o. female past medical history significant of malignant melanoma to the brain bone and liver diagnosed in October she was started on immunotherapy and radiation therapy in November, recently discharged from the hospital for acute metabolic encephalopathy that was likely multifactorial which improved with holding IV fluids and opiates, came in complaining of fatigue decreased oral intake and lethargy denies any fever abdominal pain nausea vomiting or diarrhea.   Discharge Diagnoses:  Principal Problem:   Tachycardia Active Problems:   Acute encephalopathy   Metastatic malignant melanoma (HCC)   Cancer associated pain   Antineoplastic chemotherapy induced anemia   Dehydration   Decreased appetite   Metastatic melanoma (HCC)   Acute metabolic encephalopathy  Acute metabolic encephalopathy: Probably multifactorial in the setting of dehydration, polypharmacy Phenergan, Ambien, Flexeril, Dilaudid, MS Contin, Ativan and Lyrica. After holding these medications started on IV fluids her mentation returned to baseline. Phenergan, Dilaudid and MS Contin were discontinued.  Antineoplastic chemotherapy-induced anemia: FOBT negative, hemoglobin has remained stable. Follow-up with oncology as an outpatient.  Sinus tachycardia: Cortisol was unremarkable as well as TSH. Started her on metoprolol.  Elevate LFTs: Negative metastatic disease.  Metastatic melanoma: Patient received immunotherapy in December  5. Undergoing palliative radiation.  Hypovolemic hyponatremia: Improved with IV fluid resuscitation.  Mild lactic acidosis: Resolved with IV fluids.  Discharge Instructions  Discharge Instructions     Diet - low sodium heart healthy   Complete by: As directed    Increase activity slowly   Complete by: As directed       Allergies as of 01/05/2023       Reactions   Aspirin Swelling, Other (See Comments)   SWELLING REACTION UNSPECIFIED        Medication List     STOP taking these medications    HYDROmorphone 2 MG tablet Commonly known as: Dilaudid   morphine 15 MG 12 hr tablet Commonly known as: MS CONTIN   ondansetron 8 MG tablet Commonly known as: ZOFRAN   polyethylene glycol 17 g packet Commonly known as: MIRALAX / GLYCOLAX   promethazine 25 MG tablet Commonly known as: PHENERGAN       TAKE these medications    Ambien CR 6.25 MG CR tablet Generic drug: zolpidem Take 6.25 mg by mouth at bedtime.   busPIRone 15 MG tablet Commonly known as: BUSPAR Take 15 mg by mouth 2 (two) times daily.   cyclobenzaprine 5 MG tablet Commonly known as: FLEXERIL Take 1 tablet (5 mg total) by mouth 3 (three) times daily as needed for muscle spasms.   dronabinol 2.5 MG capsule Commonly known as: MARINOL Take 1 capsule (2.5 mg total) by mouth 2 (two) times daily before a meal. What changed:  how much to take when to take this additional instructions   famotidine-calcium carbonate-magnesium hydroxide 10-800-165 MG chewable tablet Commonly known as: PEPCID COMPLETE Chew 1 tablet by mouth in the morning.   LORazepam 0.5 MG tablet Commonly known as: ATIVAN Take 1 tab 30 minutes before PET and 2 tabs before MRI What changed:  how much to take how to take this when to take  this additional instructions   metoprolol tartrate 25 MG tablet Commonly known as: LOPRESSOR Take 1 tablet (25 mg total) by mouth 2 (two) times daily.   ondansetron 4 MG disintegrating  tablet Commonly known as: ZOFRAN-ODT Take 1 tablet (4 mg total) by mouth every 8 (eight) hours as needed for nausea or vomiting.   pantoprazole 40 MG tablet Commonly known as: Protonix Take 1 tablet (40 mg total) by mouth daily. What changed: when to take this   potassium chloride SA 20 MEQ tablet Commonly known as: KLOR-CON M Take 1 tablet (20 mEq total) by mouth 2 (two) times daily for 5 days.   pregabalin 75 MG capsule Commonly known as: LYRICA Take 1 capsule (75 mg total) by mouth every morning. What changed: when to take this   rosuvastatin 10 MG tablet Commonly known as: CRESTOR Take 10 mg by mouth at bedtime.   senna-docusate 8.6-50 MG tablet Commonly known as: Senokot-S Take 1 tablet by mouth 2 (two) times daily.   traMADol 50 MG tablet Commonly known as: ULTRAM Take 2 tablets (100 mg total) by mouth every 6 (six) hours as needed for severe pain (pain score 7-10).        Allergies  Allergen Reactions   Aspirin Swelling and Other (See Comments)    SWELLING REACTION UNSPECIFIED    Consultations: Oncology   Procedures/Studies: DG Chest 2 View  Result Date: 01/03/2023 CLINICAL DATA:  Sepsis EXAM: CHEST - 2 VIEW COMPARISON:  12/27/2022 FINDINGS: The heart size and mediastinal contours are within normal limits. Trace pleural effusions. Unchanged left upper lobe lung nodule. The visualized skeletal structures are unremarkable. IMPRESSION: 1. Trace pleural effusions. No focal airspace opacity. 2.  Unchanged left upper lobe lung nodule. Electronically Signed   By: [name]   On: 01/03/2023 15:59   CT Angio Chest PE W and/or Wo Contrast  Result Date: 12/28/2022 CLINICAL DATA:  Patient with metastatic melanoma, new transaminitis, pulmonary embolism suspected. EXAM: CT ANGIOGRAPHY CHEST CT ABDOMEN AND PELVIS WITH CONTRAST TECHNIQUE: Multidetector CT imaging of the chest was performed using the standard protocol during bolus administration of intravenous  contrast. Multiplanar CT image reconstructions and MIPs were obtained to evaluate the vascular anatomy. Multidetector CT imaging of the abdomen and pelvis was performed using the standard protocol during bolus administration of intravenous contrast. RADIATION DOSE REDUCTION: This exam was performed according to the departmental dose-optimization program which includes automated exposure control, adjustment of the mA and/or kV according to patient size and/or use of iterative reconstruction technique. CONTRAST:  OMNIPAQUE IOHEXOL 350 MG/ML SOLN COMPARISON:  PET-CT recently 12/09/2022, and CTA chest, abdomen and pelvis 11/16/2022. FINDINGS: CTA CHEST FINDINGS Cardiovascular: The cardiac size is normal. No arterial dilatation or embolism is seen. Minimal pericardial effusion anteriorly. Scattered calcific plaques lad coronary artery. Mild aortic atherosclerosis. There is no aneurysm, stenosis or dissection. The great vessels are widely patent. Pulmonary veins are nondistended. Mediastinum/Nodes: Enlarged superior left hilar lymph node is 1.3 cm in short axis, previously 1.1 cm. No other intrathoracic adenopathy is seen. 1.2 cm hypodense nodule in the inferior pole of the left lobe of the thyroid gland is unchanged. No follow-up imaging is recommended. Lungs/Pleura: 1.5 cm pulmonary metastasis left upper lobe, level of the aortic arch, unchanged from the PET-CT, 11/16/2022 was 1.2 cm. There are several new nodules in the right middle lobe, most are 3 mm or smaller for example on series 13 axial images 80 6-99. Largest of these is 5 mm anteriorly on  13:90, concerning for new metastases. In the right lower lobe there is a new 5 mm nodule about the fissure on 13:90. Mild paraseptal emphysema again seen in the lung apices. There are coarse atelectatic bands in the posterior bases. On the left there is increased conspicuity of fissural nodules in the lower lung field, largest of these is about 3 mm. There is a new  nodule in the left lower lobe measuring 5 mm on 13:99 and a new 3 mm apical left lung nodule on 13:44. There is no pleural effusion, no other new lung abnormality. No focal consolidation. Musculoskeletal: There is chronic interbody fusion hardware in the lower cervical spine. Multifocal lytic metastatic disease particularly in the thoracic spine but also in the ribs and sternum, with pathologic compression fracture of the T3 vertebral body, worsening lytic metastasis T4 body with extension into the spinal canal and increased erosion through the posterior cortex, with increased soft tissue infiltration into the left T3-4 foramen. Similar increased posterior cortical erosion and metastatic extension into the spinal canal at T8. Mass effect on the cord could be present at these levels. Review of the MIP images confirms the above findings. CT ABDOMEN and PELVIS FINDINGS Hepatobiliary: Innumerable diffuse metastases to the liver largest is in segment 2, today 7.5 cm, on the PET-CT 7.4 cm, the next largest is medially in segment 5 measuring 3.9 cm, previously 3.3 cm. Gallbladder is absent without bile duct dilatation. Pancreas: No abnormality. Spleen: New scattered nonspecific subcentimeter hypodensities are noted in the inferior aspect of the spleen, possible metastases. Largest is 8 mm on 19:24. Adrenals/Urinary Tract: Stable 1 cm left adrenal nodule, was not hypermetabolic on PET-CT. No right adrenal or broad or renal mass. No urinary stone or obstruction. Unremarkable bladder for the degree of distention. Stomach/Bowel: Unremarkable contracted stomach. Normal caliber unopacified small bowel. Normal appendix. Moderate fecal stasis ascending and transverse colon. Distal descending colon and sigmoid either demonstrating mild wall thickening or underdistention. Unremarkable rectum. Vascular/Lymphatic: Aortic atherosclerosis. No enlarged abdominal or pelvic lymph nodes. Reproductive: Status post hysterectomy. No adnexal  masses. Multiple pelvic phleboliths. Other: No ascites or free air, no incarcerated hernia. Musculoskeletal: Progressive lytic metastatic disease, including compared with 12/09/2022, is noted at multiple levels of the lumbar spine, throughout the sacrum and bony pelvis, and with multiple small lytic lesions in both proximal femurs. There is increased pathologic compression fracture of the L5 vertebral body with soft tissue herniation into the spinal canal severely narrowing the spinal canal from the L4-5 to the L5-S1 level. There is an enhancing mass in the right dorsal paraspinous musculature at L2-3 measuring 2.3 cm and was seen on 12/09/2022. Destructive mass of S3 to the left today measures 3 cm, previously 2.6 cm. Review of the MIP images confirms the above findings. IMPRESSION: 1. No evidence of arterial dilatation or embolism. 2. 1.5 cm pulmonary metastasis left upper lobe, unchanged from the PET-CT, 1.2 cm on 11/16/2022. 3. Multiple new nodules in the right middle lobe, right lower lobe, and left apical lung, largest 5 mm, concerning for new metastases. 4. Increased conspicuity of fissural nodules in the left lower lung field. 5. Increased size of a left hilar lymph node. 6. Innumerable diffuse metastases to the liver, largest 7.5 cm in segment 2, previously 7.4 cm. 7. New scattered subcentimeter hypodensities in the inferior spleen, possible metastases. 8. Progressive lytic metastatic disease throughout the spine, sacrum, bony pelvis, and proximal femurs. 9. Increased pathologic compression fracture of the T3 and L5 vertebral body with  soft tissue herniation into the spinal canal severely narrowing the spinal canal from the L4-5 to the L5-S1 level, and in the thoracic spine with increased soft tissue infiltration of the spinal canal at T4 and T8, and in the left T3-4 foramen. 10. Stable 2.3 cm enhancing mass in the right dorsal paraspinous musculature at L2-3. 11. Constipation. 12. Distal descending colon  and sigmoid either demonstrating mild wall thickening or underdistention. Correlate clinically for underlying colitis. 13. Aortic and coronary artery atherosclerosis. Aortic Atherosclerosis (ICD10-I70.0) and Emphysema (ICD10-J43.9). Electronically Signed   By: Almira Bar M.D.   On: 12/28/2022 02:39   CT ABDOMEN PELVIS W CONTRAST  Result Date: 12/28/2022 CLINICAL DATA:  Patient with metastatic melanoma, new transaminitis, pulmonary embolism suspected. EXAM: CT ANGIOGRAPHY CHEST CT ABDOMEN AND PELVIS WITH CONTRAST TECHNIQUE: Multidetector CT imaging of the chest was performed using the standard protocol during bolus administration of intravenous contrast. Multiplanar CT image reconstructions and MIPs were obtained to evaluate the vascular anatomy. Multidetector CT imaging of the abdomen and pelvis was performed using the standard protocol during bolus administration of intravenous contrast. RADIATION DOSE REDUCTION: This exam was performed according to the departmental dose-optimization program which includes automated exposure control, adjustment of the mA and/or kV according to patient size and/or use of iterative reconstruction technique. CONTRAST:  OMNIPAQUE IOHEXOL 350 MG/ML SOLN COMPARISON:  PET-CT recently 12/09/2022, and CTA chest, abdomen and pelvis 11/16/2022. FINDINGS: CTA CHEST FINDINGS Cardiovascular: The cardiac size is normal. No arterial dilatation or embolism is seen. Minimal pericardial effusion anteriorly. Scattered calcific plaques lad coronary artery. Mild aortic atherosclerosis. There is no aneurysm, stenosis or dissection. The great vessels are widely patent. Pulmonary veins are nondistended. Mediastinum/Nodes: Enlarged superior left hilar lymph node is 1.3 cm in short axis, previously 1.1 cm. No other intrathoracic adenopathy is seen. 1.2 cm hypodense nodule in the inferior pole of the left lobe of the thyroid gland is unchanged. No follow-up imaging is recommended. Lungs/Pleura:  1.5 cm pulmonary metastasis left upper lobe, level of the aortic arch, unchanged from the PET-CT, 11/16/2022 was 1.2 cm. There are several new nodules in the right middle lobe, most are 3 mm or smaller for example on series 13 axial images 80 6-99. Largest of these is 5 mm anteriorly on 13:90, concerning for new metastases. In the right lower lobe there is a new 5 mm nodule about the fissure on 13:90. Mild paraseptal emphysema again seen in the lung apices. There are coarse atelectatic bands in the posterior bases. On the left there is increased conspicuity of fissural nodules in the lower lung field, largest of these is about 3 mm. There is a new nodule in the left lower lobe measuring 5 mm on 13:99 and a new 3 mm apical left lung nodule on 13:44. There is no pleural effusion, no other new lung abnormality. No focal consolidation. Musculoskeletal: There is chronic interbody fusion hardware in the lower cervical spine. Multifocal lytic metastatic disease particularly in the thoracic spine but also in the ribs and sternum, with pathologic compression fracture of the T3 vertebral body, worsening lytic metastasis T4 body with extension into the spinal canal and increased erosion through the posterior cortex, with increased soft tissue infiltration into the left T3-4 foramen. Similar increased posterior cortical erosion and metastatic extension into the spinal canal at T8. Mass effect on the cord could be present at these levels. Review of the MIP images confirms the above findings. CT ABDOMEN and PELVIS FINDINGS Hepatobiliary: Innumerable diffuse metastases to  the liver largest is in segment 2, today 7.5 cm, on the PET-CT 7.4 cm, the next largest is medially in segment 5 measuring 3.9 cm, previously 3.3 cm. Gallbladder is absent without bile duct dilatation. Pancreas: No abnormality. Spleen: New scattered nonspecific subcentimeter hypodensities are noted in the inferior aspect of the spleen, possible metastases.  Largest is 8 mm on 19:24. Adrenals/Urinary Tract: Stable 1 cm left adrenal nodule, was not hypermetabolic on PET-CT. No right adrenal or broad or renal mass. No urinary stone or obstruction. Unremarkable bladder for the degree of distention. Stomach/Bowel: Unremarkable contracted stomach. Normal caliber unopacified small bowel. Normal appendix. Moderate fecal stasis ascending and transverse colon. Distal descending colon and sigmoid either demonstrating mild wall thickening or underdistention. Unremarkable rectum. Vascular/Lymphatic: Aortic atherosclerosis. No enlarged abdominal or pelvic lymph nodes. Reproductive: Status post hysterectomy. No adnexal masses. Multiple pelvic phleboliths. Other: No ascites or free air, no incarcerated hernia. Musculoskeletal: Progressive lytic metastatic disease, including compared with 12/09/2022, is noted at multiple levels of the lumbar spine, throughout the sacrum and bony pelvis, and with multiple small lytic lesions in both proximal femurs. There is increased pathologic compression fracture of the L5 vertebral body with soft tissue herniation into the spinal canal severely narrowing the spinal canal from the L4-5 to the L5-S1 level. There is an enhancing mass in the right dorsal paraspinous musculature at L2-3 measuring 2.3 cm and was seen on 12/09/2022. Destructive mass of S3 to the left today measures 3 cm, previously 2.6 cm. Review of the MIP images confirms the above findings. IMPRESSION: 1. No evidence of arterial dilatation or embolism. 2. 1.5 cm pulmonary metastasis left upper lobe, unchanged from the PET-CT, 1.2 cm on 11/16/2022. 3. Multiple new nodules in the right middle lobe, right lower lobe, and left apical lung, largest 5 mm, concerning for new metastases. 4. Increased conspicuity of fissural nodules in the left lower lung field. 5. Increased size of a left hilar lymph node. 6. Innumerable diffuse metastases to the liver, largest 7.5 cm in segment 2, previously  7.4 cm. 7. New scattered subcentimeter hypodensities in the inferior spleen, possible metastases. 8. Progressive lytic metastatic disease throughout the spine, sacrum, bony pelvis, and proximal femurs. 9. Increased pathologic compression fracture of the T3 and L5 vertebral body with soft tissue herniation into the spinal canal severely narrowing the spinal canal from the L4-5 to the L5-S1 level, and in the thoracic spine with increased soft tissue infiltration of the spinal canal at T4 and T8, and in the left T3-4 foramen. 10. Stable 2.3 cm enhancing mass in the right dorsal paraspinous musculature at L2-3. 11. Constipation. 12. Distal descending colon and sigmoid either demonstrating mild wall thickening or underdistention. Correlate clinically for underlying colitis. 13. Aortic and coronary artery atherosclerosis. Aortic Atherosclerosis (ICD10-I70.0) and Emphysema (ICD10-J43.9). Electronically Signed   By: Almira Bar M.D.   On: 12/28/2022 02:39   CT HEAD WO CONTRAST ( )  Result Date: 12/27/2022 CLINICAL DATA:  Delirium.  History of brain metastases. EXAM: CT HEAD WITHOUT CONTRAST TECHNIQUE: Contiguous axial images were obtained from the base of the skull through the vertex without intravenous contrast. RADIATION DOSE REDUCTION: This exam was performed according to the departmental dose-optimization program which includes automated exposure control, adjustment of the mA and/or kV according to patient size and/or use of iterative reconstruction technique. COMPARISON:  Head CT 11/11/2022.  MRI brain 12/17/2022. FINDINGS: Brain: No evidence of acute infarction, hemorrhage, hydrocephalus, or extra-axial fluid collection. There are 2 rounded hyperdense metastatic lesions in  the right frontal cortex seen best on coronal imaging. These are unchanged from prior MRI. There is also 2 mm hyperdense metastatic lesion in the right occipital region best seen on coronal image 5/46. This is not well appreciated on prior  MRI. There is no mass effect. Minimal patchy periventricular white matter hypodensity appears similar to prior. Vascular: No hyperdense vessel or unexpected calcification. Skull: Normal. Negative for fracture or focal lesion. Sinuses/Orbits: No acute finding. Other: None. IMPRESSION: 1. No acute intracranial process. 2. Two stable rounded hyperdense metastatic lesions in the right frontal cortex. There is also a 2 mm hyperdense metastatic lesion in the right occipital region which is not well appreciated on prior MRI. No mass effect. Electronically Signed   By: Darliss Cheney M.D.   On: 12/27/2022 23:20   DG Chest Port 1 View  Result Date: 12/27/2022 CLINICAL DATA:  Questionable sepsis - evaluate for abnormality EXAM: PORTABLE CHEST 1 VIEW COMPARISON:  Chest x-ray 11/16/2022, CT angio chest 11/16/2022, PET CT 12/09/2022 FINDINGS: The heart and mediastinal contours are within normal limits. Aortic calcification. Redemonstration of an approximate 1.6 cm left upper quadrant pulmonary better evaluated on PET-CT 12/09/2022. No focal consolidation. No pulmonary edema. No pleural effusion. No pneumothorax. No acute osseous abnormality.  Cervical spine surgical hardware. IMPRESSION: 1. No acute cardiopulmonary abnormality. 2. Redemonstration of an approximate 1.6 cm left upper quadrant pulmonary better evaluated on PET-CT 12/09/2022. 3.  Aortic Atherosclerosis (ICD10-I70.0). Electronically Signed   By: Tish Frederickson M.D.   On: 12/27/2022 23:05   MR BRAIN W WO CONTRAST  Addendum Date: 12/17/2022   ADDENDUM REPORT: 12/17/2022 13:41 ADDENDUM: Study discussed by telephone with [name] COURTNEY WHARTON on 12/17/2022 at 1332 hours. Electronically Signed   By: Odessa Fleming M.D.   On: 12/17/2022 13:41   Result Date: 12/17/2022 CLINICAL DATA:  57 year old female with metastatic melanoma. Staging. Study under anesthesia. EXAM: MRI HEAD WITHOUT AND WITH CONTRAST TECHNIQUE: Multiplanar, multiecho pulse sequences of the brain and  surrounding structures were obtained without and with intravenous contrast. CONTRAST:  5mL GADAVIST GADOBUTROL 1 MMOL/ML IV SOLN COMPARISON:  PET-CT 12/09/2022.  Head CT 11/11/2022. FINDINGS: Brain: Several small intrinsic T1 hyperintense (series 8, image 37) and/or enhancing (series 19, image 30) brain lesions compatible with small melanoma metastases. Only 1 of these demonstrates evidence of hemosiderin on SWI (series 7, image 72). No associated cerebral edema or mass effect. Three such small metastases are identified including 3-4 mm at the right inferior frontal gyrus on series 19, image 30, 3-4 mm at the right operculum image 35, and 2-3 mm at the right middle frontal gyrus on image 38. Although those lesions are relatively clustered, no additional brain metastases, abnormal enhancement, or abnormal susceptibility is identified elsewhere. No dural thickening. No superimposed restricted diffusion to suggest acute infarction. No midline shift, ventriculomegaly, extra-axial collection or acute intracranial hemorrhage. Cervicomedullary junction and pituitary are within normal limits. Scattered nonenhancing white matter T2 and FLAIR hyperintensity is moderate for age, nonspecific but most commonly small vessel related. Vascular: Major intracranial vascular flow voids are preserved. Dominant distal left vertebral artery. Following contrast major dural venous sinuses are enhancing and appear to be patent. Skull and upper cervical spine: Heterogeneous background bone marrow signal. Known osseous metastatic disease, but no destructive lesion identified in the skull or visible cervical spine. Grossly negative visible cervical spinal cord. Sinuses/Orbits: Negative orbits. Minor paranasal sinus mucosal thickening. Other: Minimal left mastoid effusion. Visible internal auditory structures appear normal. Intubated, study under anesthesia. Negative visible  scalp and face. IMPRESSION: 1. Three small right hemisphere brain  metastases metastases ranging from 2 mm to 4 mm. Minimal hemosiderin. No associated cerebral edema or mass effect. No other metastatic disease identified in the brain. 2. Known osseous metastatic disease but no destructive lesion identified in the skull or visible cervical spine. 3. Moderate for age cerebral white matter changes most commonly due to small vessel disease. Electronically Signed: By: Odessa Fleming M.D. On: 12/17/2022 13:16   MR LUMBAR SPINE W WO CONTRAST  Addendum Date: 12/17/2022   ADDENDUM REPORT: 12/17/2022 13:40 ADDENDUM: Study discussed by telephone with [name] COURTNEY WHARTON on 12/17/2022 at 1332 hours. And note that CLINICAL DATA should read 57 year old female with metastatic melanoma. Staging. Study under anesthesia. Electronically Signed   By: Odessa Fleming M.D.   On: 12/17/2022 13:40   Result Date: 12/17/2022 EXAM: MRI LUMBAR SPINE WITHOUT AND WITH CONTRAST TECHNIQUE: Multiplanar and multiecho pulse sequences of the lumbar spine were obtained without and with intravenous contrast. CONTRAST:  5mL GADAVIST GADOBUTROL 1 MMOL/ML IV SOLN COMPARISON:  Brain MRI today reported separately. PET-CT 12/09/2022. CTA chest abdomen pelvis 11/16/2022. FINDINGS: Segmentation:  Normal on the comparison CTA. Alignment:  Mild straightening of lumbar lordosis now. Vertebrae: Diffuse skeletal metastatic disease throughout the visible spine and pelvis. Scout view demonstrates thoracic vertebral metastases with extension into the ventral epidural space at both the T4 and T8 levels (series 10, image 14). Pathologic fracture of the L5 vertebral body has progressed since 11/16/2022. L5 loss of height is now up to 60%, and there is retropulsed and/or epidural extension of tumor at that level (series 16, image 31), see additional spinal canal details below. Lumbar ventral epidural tumor also at the left L1 vertebral level without spinal stenosis (series 15, image 17), and at the L4 level on the right (series 15, image 25)  without spinal stenosis. Incidental bulky extraosseous tumor also from the right L1 transverse process, 2.2 cm right paraspinal muscle mass there on series 15, image 7. No other pathologic fracture identified. Conus medullaris and cauda equina: Conus extends to the L1 level. No lower spinal cord or conus signal abnormality. But there is an indistinct appearance of the surface of the lower thoracic spinal cord on T2 imaging (series 12, image 8), and indeterminate enhancement pattern at the conus (series 17, image 7). No obvious nerve root thickening or enhancement in the cauda equina. Paraspinal and other soft tissues: Right L1 paraspinal muscle tumor arising from the posterior elements at that level as above. Partially visible extensive liver tumor. No retroperitoneal, prevertebral lymphadenopathy. Disc levels: No significant lumbar spine degeneration superimposed on the diffuse metastatic disease. Ventral epidural tumor at L2 and L4 without spinal stenosis as above. Bulky tumor retropulsion, epidural extension at the L5 pathologic compression fracture level now resulting in severe spinal stenosis (series 15, image 31 and series 12, image 8), severe lateral recess stenosis (S1 nerve levels) and severe proximal L5 foraminal stenosis. IMPRESSION: 1. Diffuse skeletal metastatic disease. Involvement throughout the visible spine and pelvis. 2. Progressed pathologic fracture of the L5 since 10/21/2024with 60% loss of height and bulky retropulsed/epidural tumor. 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 3. 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 And scout view demonstrates thoracic spine ventral epidural tumor at both T4 and T8 levels. 4. 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 Electronically Signed: By: Odessa Fleming M.D. On: 12/17/2022 13:28   NM PET Image Initial (  PI) Whole Body  Result Date: 12/09/2022 CLINICAL DATA:   Initial treatment strategy for metastatic melanoma. EXAM: NUCLEAR MEDICINE PET WHOLE BODY TECHNIQUE: 6.6 mCi F-18 FDG was injected intravenously. Full-ring PET imaging was performed from the head to foot after the radiotracer. CT data was obtained and used for attenuation correction and anatomic localization. Fasting blood glucose: 115 mg/dl COMPARISON:  CTA chest abdomen pelvis dated 11/16/2022 FINDINGS: Mediastinal blood pool activity: SUV max 2.1 HEAD/NECK: No hypermetabolic activity in the scalp. No hypermetabolic cervical lymph nodes. Incidental CT findings: none CHEST: 14 mm posterior left upper lobe pulmonary nodule (series 7/image 20), max SUV 8.7. 11 mm short axis left AP window node (series 4/image 82), max SUV 11.2. Incidental CT findings: Mild atherosclerotic calcifications of the aortic arch. Mild coronary atherosclerosis of the LAD. Mild centrilobular and paraseptal emphysematous changes, upper lung predominant. ABDOMEN/PELVIS: Multifocal hepatic metastases, approximately 10-15 in number, including: --7.4 cm mass in the lateral segment left hepatic lobe (series 4/image 112), max SUV 16.3 --2.9 cm mass in the medial aspect of segment 6 (series 4/image 122), max SUV 14.7 No abnormal hypermetabolism in the pancreas, spleen, or adrenal glands. No hypermetabolic abdominopelvic lymphadenopathy. Incidental CT findings: Status post cholecystectomy. Status post hysterectomy. Mild rectosigmoid colonic wall thickening (series 4/image 135), nonspecific. Atherosclerotic calcifications of the abdominal aorta and branch vessels. SKELETON: Multifocal/widespread osseous metastases, including: --Left humeral head, max SUV 6.4 --T4 vertebral body, max SUV 21.1 --Right lateral 6th rib, max SUV 4.2 --Right inferior sternum, max SUV 12.5 --L4 vertebral body, max SUV 16.3 --Left sacrum, max SUV 15.4 Hypermetabolism involving right anterior thigh musculature, favored to be physiologic. Incidental CT findings: none  EXTREMITIES: No abnormal hypermetabolic activity in the lower extremities. Incidental CT findings: none IMPRESSION: Left upper lobe pulmonary metastasis, as above. Associated mediastinal nodal metastasis. Multifocal hepatic metastases, as above. Multifocal/widespread osseous metastases, as above. Electronically Signed   By: Charline Bills M.D.   On: 12/09/2022 23:34   (Echo, Carotid, EGD, Colonoscopy, ERCP)    Subjective: No complaints  Discharge Exam: Vitals:   01/05/23 0437 01/05/23 0742  BP: 113/74 125/76  Pulse: (!) 118 (!) 125  Resp: 18 16  Temp: 99.4 F (37.4 C) 97.8 F (36.6 C)  SpO2: 96% 95%   Vitals:   01/05/23 0003 01/05/23 0437 01/05/23 0500 01/05/23 0742  BP: 96/65 113/74  125/76  Pulse: (!) 119 (!) 118  (!) 125  Resp: 19 18  16   Temp: 99 F (37.2 C) 99.4 F (37.4 C)  97.8 F (36.6 C)  TempSrc: Oral Oral  Oral  SpO2: 93% 96%  95%  Weight:   59.8 kg   Height:        General: Pt is alert, awake, not in acute distress Cardiovascular: RRR, S1/S2 +, no rubs, no gallops Respiratory: CTA bilaterally, no wheezing, no rhonchi Abdominal: Soft, NT, ND, bowel sounds + Extremities: no edema, no cyanosis    The results of significant diagnostics from this hospitalization (including imaging, microbiology, ancillary and laboratory) are listed below for reference.     Microbiology: Recent Results (from the past 240 hour(s))  Resp panel by RT-PCR (RSV, Flu A&B, Covid) Anterior Nasal Swab     Status: None   Collection Time: 12/27/22 12:30 AM   Specimen: Anterior Nasal Swab  Result Value Ref Range Status   SARS Coronavirus 2 by RT PCR NEGATIVE NEGATIVE Final    Comment: (NOTE) SARS-CoV-2 target nucleic acids are NOT DETECTED.  The SARS-CoV-2 RNA is generally detectable in  upper respiratory specimens during the acute phase of infection. The lowest concentration of SARS-CoV-2 viral copies this assay can detect is 138 copies/mL. A negative result does not preclude  SARS-Cov-2 infection and should not be used as the sole basis for treatment or other patient management decisions. A negative result may occur with  improper specimen collection/handling, submission of specimen other than nasopharyngeal swab, presence of viral mutation(s) within the areas targeted by this assay, and inadequate number of viral copies(<138 copies/mL). A negative result must be combined with clinical observations, patient history, and epidemiological information. The expected result is Negative.  Fact Sheet for Patients:  BloggerCourse.com  Fact Sheet for Healthcare Providers:  SeriousBroker.it  This test is no t yet approved or cleared by the [place] FDA and  has been authorized for detection and/or diagnosis of SARS-CoV-2 by FDA under an Emergency Use Authorization (EUA). This EUA will remain  in effect (meaning this test can be used) for the duration of the COVID-19 declaration under Section 564(b)(1) of the Act, 21 U.S.C.section 360bbb-3(b)(1), unless the authorization is terminated  or revoked sooner.       Influenza A by PCR NEGATIVE NEGATIVE Final   Influenza B by PCR NEGATIVE NEGATIVE Final    Comment: (NOTE) The Xpert Xpress SARS-CoV-2/FLU/RSV plus assay is intended as an aid in the diagnosis of influenza from Nasopharyngeal swab specimens and should not be used as a sole basis for treatment. Nasal washings and aspirates are unacceptable for Xpert Xpress SARS-CoV-2/FLU/RSV testing.  Fact Sheet for Patients: BloggerCourse.com  Fact Sheet for Healthcare Providers: SeriousBroker.it  This test is not yet approved or cleared by the [place] FDA and has been authorized for detection and/or diagnosis of SARS-CoV-2 by FDA under an Emergency Use Authorization (EUA). This EUA will remain in effect (meaning this test can be used) for the duration of  the COVID-19 declaration under Section 564(b)(1) of the Act, 21 U.S.C. section 360bbb-3(b)(1), unless the authorization is terminated or revoked.     Resp Syncytial Virus by PCR NEGATIVE NEGATIVE Final    Comment: (NOTE) Fact Sheet for Patients: BloggerCourse.com  Fact Sheet for Healthcare Providers: SeriousBroker.it  This test is not yet approved or cleared by the [place] FDA and has been authorized for detection and/or diagnosis of SARS-CoV-2 by FDA under an Emergency Use Authorization (EUA). This EUA will remain in effect (meaning this test can be used) for the duration of the COVID-19 declaration under Section 564(b)(1) of the Act, 21 U.S.C. section 360bbb-3(b)(1), unless the authorization is terminated or revoked.  Performed at [facility], [address], [place], [place] [address]   Blood Culture (routine x 2)     Status: None   Collection Time: 12/27/22 10:46 PM   Specimen: BLOOD  Result Value Ref Range Status   Specimen Description BLOOD SITE NOT SPECIFIED  Final   Special Requests   Final    BOTTLES DRAWN AEROBIC AND ANAEROBIC Blood Culture adequate volume   Culture   Final    NO GROWTH 5 DAYS Performed at [facility], [address], [place], [place] [address]    Report Status 01/02/2023 FINAL  Final  Blood Culture (routine x 2)     Status: None   Collection Time: 12/28/22 12:07 AM   Specimen: BLOOD RIGHT ARM  Result Value Ref Range Status   Specimen Description BLOOD RIGHT ARM  Final   Special Requests   Final    BOTTLES DRAWN AEROBIC AND ANAEROBIC  Blood Culture results may not be optimal due to an inadequate volume of blood received in culture bottles   Culture   Final    NO GROWTH 5 DAYS Performed at [facility], [address], [place], [place] [address]    Report Status 01/02/2023 FINAL  Final  Culture, blood (Routine x 2)     Status: None (Preliminary result)    Collection Time: 01/03/23  4:02 PM   Specimen: BLOOD  Result Value Ref Range Status   Specimen Description   Final    BLOOD LEFT ANTECUBITAL Performed at [facility], [address], [place], [place] [address]    Special Requests   Final    BOTTLES DRAWN AEROBIC AND ANAEROBIC Blood Culture adequate volume Performed at [facility], [address] [place], [place] [address]    Culture   Final    NO GROWTH 2 DAYS Performed at [facility], [address] [place], [place] [address]    Report Status PENDING  Incomplete  Culture, blood (Routine x 2)     Status: None (Preliminary result)   Collection Time: 01/03/23  4:11 PM   Specimen: BLOOD  Result Value Ref Range Status   Specimen Description   Final    BLOOD BLOOD RIGHT HAND Performed at [facility], [address] [place], [place] [address]    Special Requests   Final    BOTTLES DRAWN AEROBIC AND ANAEROBIC Blood Culture adequate volume Performed at [facility], [address] [place], [place] [address]    Culture   Final    NO GROWTH 2 DAYS Performed at [facility], [address], [place], [place] [address]    Report Status PENDING  Incomplete     Labs: BNP (last 3 results) No results for input(s): "BNP" in the last 8760 hours. Basic Metabolic Panel: Recent Labs  Lab 12/31/22 1303 01/03/23 1717 01/04/23 0015 01/05/23 0428  NA 136 134* 134* 136  K 3.4* 4.6 3.7 4.2  CL 97* 99 104 105  CO2 34* 26 25 22   GLUCOSE 107* 95 108* 96  BUN 14 14 13 10   CREATININE 1.09* 1.08* 0.90 0.87  CALCIUM 10.3 9.2 8.4* 9.0   Liver Function Tests: Recent Labs  Lab 12/31/22 1303 01/03/23 1717 01/05/23 0428  AST 102* 163* 119*  ALT 40 63* 60*  ALKPHOS 384* 444* 411*  BILITOT 1.1 1.8* 1.3*  PROT 6.2* 5.5* 5.3*  ALBUMIN 3.1* 2.2* 2.0*   No results for input(s): "LIPASE", "AMYLASE" in the last 168 hours. Recent Labs  Lab  01/04/23 0015  AMMONIA 31   CBC: Recent Labs  Lab 12/31/22 1303 01/03/23 1602 01/04/23 0015 01/05/23 0428  WBC 7.8 7.2 5.7 6.6  NEUTROABS 6.2 5.6  --   --   HGB 10.7* 10.5* 8.0* 8.8*  HCT 32.3* 31.6* 24.4* 27.3*  MCV 93.9 95.8 97.2 98.6  PLT 301 256 182 203   Cardiac Enzymes: No results for input(s): "CKTOTAL", "CKMB", "CKMBINDEX", "TROPONINI" in the last 168 hours. BNP: Invalid input(s): "POCBNP" CBG: No results for input(s): "GLUCAP" in the last 168 hours. D-Dimer No results for input(s): "DDIMER" in the last 72 hours. Hgb A1c No results for input(s): "HGBA1C" in the last 72 hours. Lipid Profile No results for input(s): "CHOL", "HDL", "LDLCALC", "TRIG", "CHOLHDL", "LDLDIRECT" in the last 72 hours. Thyroid function studies Recent Labs    01/03/23 1956  TSH 1.300   Anemia  work up No results for input(s): "VITAMINB12", "FOLATE", "FERRITIN", "TIBC", "IRON", "RETICCTPCT" in the last 72 hours. Urinalysis    Component Value Date/Time   COLORURINE AMBER (A) 01/03/2023 1856   APPEARANCEUR CLEAR 01/03/2023 1856   LABSPEC 1.023 01/03/2023 1856   PHURINE 5.0 01/03/2023 1856   GLUCOSEU NEGATIVE 01/03/2023 1856   HGBUR NEGATIVE 01/03/2023 1856   HGBUR negative 01/07/2010 1438   BILIRUBINUR MODERATE (A) 01/03/2023 1856   KETONESUR 5 (A) 01/03/2023 1856   PROTEINUR 30 (A) 01/03/2023 1856   UROBILINOGEN 0.2 05/01/2014 1000   NITRITE NEGATIVE 01/03/2023 1856   LEUKOCYTESUR NEGATIVE 01/03/2023 1856   Sepsis Labs Recent Labs  Lab 12/31/22 1303 01/03/23 1602 01/04/23 0015 01/05/23 0428  WBC 7.8 7.2 5.7 6.6   Microbiology Recent Results (from the past 240 hour(s))  Resp panel by RT-PCR (RSV, Flu A&B, Covid) Anterior Nasal Swab     Status: None   Collection Time: 12/27/22 12:30 AM   Specimen: Anterior Nasal Swab  Result Value Ref Range Status   SARS Coronavirus 2 by RT PCR NEGATIVE NEGATIVE Final    Comment: (NOTE) SARS-CoV-2 target nucleic acids are NOT  DETECTED.  The SARS-CoV-2 RNA is generally detectable in upper respiratory specimens during the acute phase of infection. The lowest concentration of SARS-CoV-2 viral copies this assay can detect is 138 copies/mL. A negative result does not preclude SARS-Cov-2 infection and should not be used as the sole basis for treatment or other patient management decisions. A negative result may occur with  improper specimen collection/handling, submission of specimen other than nasopharyngeal swab, presence of viral mutation(s) within the areas targeted by this assay, and inadequate number of viral copies(<138 copies/mL). A negative result must be combined with clinical observations, patient history, and epidemiological information. The expected result is Negative.  Fact Sheet for Patients:  BloggerCourse.com  Fact Sheet for Healthcare Providers:  SeriousBroker.it  This test is no t yet approved or cleared by the [place] FDA and  has been authorized for detection and/or diagnosis of SARS-CoV-2 by FDA under an Emergency Use Authorization (EUA). This EUA will remain  in effect (meaning this test can be used) for the duration of the COVID-19 declaration under Section 564(b)(1) of the Act, 21 U.S.C.section 360bbb-3(b)(1), unless the authorization is terminated  or revoked sooner.       Influenza A by PCR NEGATIVE NEGATIVE Final   Influenza B by PCR NEGATIVE NEGATIVE Final    Comment: (NOTE) The Xpert Xpress SARS-CoV-2/FLU/RSV plus assay is intended as an aid in the diagnosis of influenza from Nasopharyngeal swab specimens and should not be used as a sole basis for treatment. Nasal washings and aspirates are unacceptable for Xpert Xpress SARS-CoV-2/FLU/RSV testing.  Fact Sheet for Patients: BloggerCourse.com  Fact Sheet for Healthcare Providers: SeriousBroker.it  This test is not yet  approved or cleared by the [place] FDA and has been authorized for detection and/or diagnosis of SARS-CoV-2 by FDA under an Emergency Use Authorization (EUA). This EUA will remain in effect (meaning this test can be used) for the duration of the COVID-19 declaration under Section 564(b)(1) of the Act, 21 U.S.C. section 360bbb-3(b)(1), unless the authorization is terminated or revoked.     Resp Syncytial Virus by PCR NEGATIVE NEGATIVE Final    Comment: (NOTE) Fact Sheet for Patients: BloggerCourse.com  Fact Sheet for Healthcare Providers: SeriousBroker.it  This test is not yet approved or cleared by the [place] FDA and has been authorized for detection and/or diagnosis of SARS-CoV-2 by  FDA under an Emergency Use Authorization (EUA). This EUA will remain in effect (meaning this test can be used) for the duration of the COVID-19 declaration under Section 564(b)(1) of the Act, 21 U.S.C. section 360bbb-3(b)(1), unless the authorization is terminated or revoked.  Performed at [facility], [address], [place], [place] [address]   Blood Culture (routine x 2)     Status: None   Collection Time: 12/27/22 10:46 PM   Specimen: BLOOD  Result Value Ref Range Status   Specimen Description BLOOD SITE NOT SPECIFIED  Final   Special Requests   Final    BOTTLES DRAWN AEROBIC AND ANAEROBIC Blood Culture adequate volume   Culture   Final    NO GROWTH 5 DAYS Performed at [facility], [address], [place], [place] [address]    Report Status 01/02/2023 FINAL  Final  Blood Culture (routine x 2)     Status: None   Collection Time: 12/28/22 12:07 AM   Specimen: BLOOD RIGHT ARM  Result Value Ref Range Status   Specimen Description BLOOD RIGHT ARM  Final   Special Requests   Final    BOTTLES DRAWN AEROBIC AND ANAEROBIC Blood Culture results may not be optimal due to an inadequate volume of blood received  in culture bottles   Culture   Final    NO GROWTH 5 DAYS Performed at [facility], [address], [place], [place] [address]    Report Status 01/02/2023 FINAL  Final  Culture, blood (Routine x 2)     Status: None (Preliminary result)   Collection Time: 01/03/23  4:02 PM   Specimen: BLOOD  Result Value Ref Range Status   Specimen Description   Final    BLOOD LEFT ANTECUBITAL Performed at [facility], [address] [place], [place] [address]    Special Requests   Final    BOTTLES DRAWN AEROBIC AND ANAEROBIC Blood Culture adequate volume Performed at [facility], [address], [place], [place] [address]    Culture   Final    NO GROWTH 2 DAYS Performed at [facility], [address], [place], [place] [address]    Report Status PENDING  Incomplete  Culture, blood (Routine x 2)     Status: None (Preliminary result)   Collection Time: 01/03/23  4:11 PM   Specimen: BLOOD  Result Value Ref Range Status   Specimen Description   Final    BLOOD BLOOD RIGHT HAND Performed at [name], [address], [place], [place] [address]    Special Requests   Final    BOTTLES DRAWN AEROBIC AND ANAEROBIC Blood Culture adequate volume Performed at [facility], [address], [place], [place] [address]    Culture   Final    NO GROWTH 2 DAYS Performed at [facility], [address] [place], [place] [address]    Report Status PENDING  Incomplete     Time coordinating discharge: Over 35 minutes  SIGNED:   Marinda Elk, MD  [facility] 01/05/2023, 9:04 AM Pager   If 7PM-7AM, please contact night-coverage [email or web address] Password [email or web address]

## 2023-01-05 NOTE — Progress Notes (Signed)
Laura [name]   DOB:01-05-1966   IO#:[id number]    ASSESSMENT & PLAN:  57 year old female with stage IV melanoma currently undergoing immunotherapy presented with fatigue, decreased oral intake and lethargy.  Renal function improved. LFT improving. Abdominal pain today. She is not resting comfortably here and would like to go home.    Acute metabolic encephalopathy Resolved on hydration Previously dehydration with morphine.  Likely multifactorial.  Last MRI did NOT show any tumor effect such as edema. Discuss with patient and husband will omit night time Lyrica while on Ambien. Communicated with pharmacist and IM attending Will discontinue morphine and dilaudid;  use prn Tramadol for pain control on discharge   Abdominal pain Right side more than left Trial of toradol 15 mg x1 with dexamethasone one time dose  Renal insufficiency Resolved on hydration   Normocytic anemia check b12, folate and ferritin   Decreased appetite Agree with Marinol twice daily   Stage IV melanoma Post cycle 2 of ipi/Nivo   PT when able.   Code Status Full code   Discharge planning Will arrange IVF on Thursday with lab and MD visit.    All questions were answered.  Communicated with husband  Thank you for the consult. Will follow with you.  Melven Sartorius, MD 01/05/2023 8:04 AM  Subjective:  Laura [name] reports feeling   Objective:  Vitals:   01/05/23 0437 01/05/23 0742  BP: 113/74 125/76  Pulse: (!) 118 (!) 125  Resp: 18 16  Temp: 99.4 F (37.4 C) 97.8 F (36.6 C)  SpO2: 96% 95%     Intake/Output Summary (Last 24 hours) at 01/05/2023 0804 Last data filed at 01/05/2023 0600 Gross per 24 hour  Intake 1529.08 ml  Output 200 ml  Net 1329.08 ml    GENERAL: alert, no distress and comfortable SKIN: skin color normal EYES: normal, sclera clear OROPHARYNX: moist, no exudate, no erythema.  LUNGS: No wheeze, rales and clear to auscultation bilaterally with normal breathing effort.   HEART: tachycardic ABDOMEN: abdomen soft, non-tender and non-distended. Discomfort on the right Musculoskeletal: no lower extremity edema NEURO: alert with no signs of confusion   Labs:  Recent Labs    12/31/22 1303 01/03/23 1717 01/04/23 0015 01/05/23 0428  NA 136 134* 134* 136  K 3.4* 4.6 3.7 4.2  CL 97* 99 104 105  CO2 34* 26 25 22   GLUCOSE 107* 95 108* 96  BUN 14 14 13 10   CREATININE 1.09* 1.08* 0.90 0.87  CALCIUM 10.3 9.2 8.4* 9.0  GFRNONAA >60 >60 >60 >60  PROT 6.2* 5.5*  --  5.3*  ALBUMIN 3.1* 2.2*  --  2.0*  AST 102* 163*  --  119*  ALT 40 63*  --  60*  ALKPHOS 384* 444*  --  411*  BILITOT 1.1 1.8*  --  1.3*    Studies:  DG Chest 2 View  Result Date: 01/03/2023 CLINICAL DATA:  Sepsis EXAM: CHEST - 2 VIEW COMPARISON:  12/27/2022 FINDINGS: The heart size and mediastinal contours are within normal limits. Trace pleural effusions. Unchanged left upper lobe lung nodule. The visualized skeletal structures are unremarkable. IMPRESSION: 1. Trace pleural effusions. No focal airspace opacity. 2.  Unchanged left upper lobe lung nodule. Electronically Signed   By: [name]   On: 01/03/2023 15:59   CT Angio Chest PE W and/or Wo Contrast  Result Date: 12/28/2022 CLINICAL DATA:  Patient with metastatic melanoma, new transaminitis, pulmonary embolism suspected. EXAM: CT ANGIOGRAPHY CHEST CT ABDOMEN AND PELVIS  WITH CONTRAST TECHNIQUE: Multidetector CT imaging of the chest was performed using the standard protocol during bolus administration of intravenous contrast. Multiplanar CT image reconstructions and MIPs were obtained to evaluate the vascular anatomy. Multidetector CT imaging of the abdomen and pelvis was performed using the standard protocol during bolus administration of intravenous contrast. RADIATION DOSE REDUCTION: This exam was performed according to the departmental dose-optimization program which includes automated exposure control, adjustment of the mA and/or kV  according to patient size and/or use of iterative reconstruction technique. CONTRAST:  OMNIPAQUE IOHEXOL 350 MG/ML SOLN COMPARISON:  PET-CT recently 12/09/2022, and CTA chest, abdomen and pelvis 11/16/2022. FINDINGS: CTA CHEST FINDINGS Cardiovascular: The cardiac size is normal. No arterial dilatation or embolism is seen. Minimal pericardial effusion anteriorly. Scattered calcific plaques lad coronary artery. Mild aortic atherosclerosis. There is no aneurysm, stenosis or dissection. The great vessels are widely patent. Pulmonary veins are nondistended. Mediastinum/Nodes: Enlarged superior left hilar lymph node is 1.3 cm in short axis, previously 1.1 cm. No other intrathoracic adenopathy is seen. 1.2 cm hypodense nodule in the inferior pole of the left lobe of the thyroid gland is unchanged. No follow-up imaging is recommended. Lungs/Pleura: 1.5 cm pulmonary metastasis left upper lobe, level of the aortic arch, unchanged from the PET-CT, 11/16/2022 was 1.2 cm. There are several new nodules in the right middle lobe, most are 3 mm or smaller for example on series 13 axial images 80 6-99. Largest of these is 5 mm anteriorly on 13:90, concerning for new metastases. In the right lower lobe there is a new 5 mm nodule about the fissure on 13:90. Mild paraseptal emphysema again seen in the lung apices. There are coarse atelectatic bands in the posterior bases. On the left there is increased conspicuity of fissural nodules in the lower lung field, largest of these is about 3 mm. There is a new nodule in the left lower lobe measuring 5 mm on 13:99 and a new 3 mm apical left lung nodule on 13:44. There is no pleural effusion, no other new lung abnormality. No focal consolidation. Musculoskeletal: There is chronic interbody fusion hardware in the lower cervical spine. Multifocal lytic metastatic disease particularly in the thoracic spine but also in the ribs and sternum, with pathologic compression fracture of the T3  vertebral body, worsening lytic metastasis T4 body with extension into the spinal canal and increased erosion through the posterior cortex, with increased soft tissue infiltration into the left T3-4 foramen. Similar increased posterior cortical erosion and metastatic extension into the spinal canal at T8. Mass effect on the cord could be present at these levels. Review of the MIP images confirms the above findings. CT ABDOMEN and PELVIS FINDINGS Hepatobiliary: Innumerable diffuse metastases to the liver largest is in segment 2, today 7.5 cm, on the PET-CT 7.4 cm, the next largest is medially in segment 5 measuring 3.9 cm, previously 3.3 cm. Gallbladder is absent without bile duct dilatation. Pancreas: No abnormality. Spleen: New scattered nonspecific subcentimeter hypodensities are noted in the inferior aspect of the spleen, possible metastases. Largest is 8 mm on 19:24. Adrenals/Urinary Tract: Stable 1 cm left adrenal nodule, was not hypermetabolic on PET-CT. No right adrenal or broad or renal mass. No urinary stone or obstruction. Unremarkable bladder for the degree of distention. Stomach/Bowel: Unremarkable contracted stomach. Normal caliber unopacified small bowel. Normal appendix. Moderate fecal stasis ascending and transverse colon. Distal descending colon and sigmoid either demonstrating mild wall thickening or underdistention. Unremarkable rectum. Vascular/Lymphatic: Aortic atherosclerosis. No enlarged abdominal or  pelvic lymph nodes. Reproductive: Status post hysterectomy. No adnexal masses. Multiple pelvic phleboliths. Other: No ascites or free air, no incarcerated hernia. Musculoskeletal: Progressive lytic metastatic disease, including compared with 12/09/2022, is noted at multiple levels of the lumbar spine, throughout the sacrum and bony pelvis, and with multiple small lytic lesions in both proximal femurs. There is increased pathologic compression fracture of the L5 vertebral body with soft tissue  herniation into the spinal canal severely narrowing the spinal canal from the L4-5 to the L5-S1 level. There is an enhancing mass in the right dorsal paraspinous musculature at L2-3 measuring 2.3 cm and was seen on 12/09/2022. Destructive mass of S3 to the left today measures 3 cm, previously 2.6 cm. Review of the MIP images confirms the above findings. IMPRESSION: 1. No evidence of arterial dilatation or embolism. 2. 1.5 cm pulmonary metastasis left upper lobe, unchanged from the PET-CT, 1.2 cm on 11/16/2022. 3. Multiple new nodules in the right middle lobe, right lower lobe, and left apical lung, largest 5 mm, concerning for new metastases. 4. Increased conspicuity of fissural nodules in the left lower lung field. 5. Increased size of a left hilar lymph node. 6. Innumerable diffuse metastases to the liver, largest 7.5 cm in segment 2, previously 7.4 cm. 7. New scattered subcentimeter hypodensities in the inferior spleen, possible metastases. 8. Progressive lytic metastatic disease throughout the spine, sacrum, bony pelvis, and proximal femurs. 9. Increased pathologic compression fracture of the T3 and L5 vertebral body with soft tissue herniation into the spinal canal severely narrowing the spinal canal from the L4-5 to the L5-S1 level, and in the thoracic spine with increased soft tissue infiltration of the spinal canal at T4 and T8, and in the left T3-4 foramen. 10. Stable 2.3 cm enhancing mass in the right dorsal paraspinous musculature at L2-3. 11. Constipation. 12. Distal descending colon and sigmoid either demonstrating mild wall thickening or underdistention. Correlate clinically for underlying colitis. 13. Aortic and coronary artery atherosclerosis. Aortic Atherosclerosis (ICD10-I70.0) and Emphysema (ICD10-J43.9). Electronically Signed   By: Almira Bar M.D.   On: 12/28/2022 02:39   CT ABDOMEN PELVIS W CONTRAST  Result Date: 12/28/2022 CLINICAL DATA:  Patient with metastatic melanoma, new  transaminitis, pulmonary embolism suspected. EXAM: CT ANGIOGRAPHY CHEST CT ABDOMEN AND PELVIS WITH CONTRAST TECHNIQUE: Multidetector CT imaging of the chest was performed using the standard protocol during bolus administration of intravenous contrast. Multiplanar CT image reconstructions and MIPs were obtained to evaluate the vascular anatomy. Multidetector CT imaging of the abdomen and pelvis was performed using the standard protocol during bolus administration of intravenous contrast. RADIATION DOSE REDUCTION: This exam was performed according to the departmental dose-optimization program which includes automated exposure control, adjustment of the mA and/or kV according to patient size and/or use of iterative reconstruction technique. CONTRAST:  OMNIPAQUE IOHEXOL 350 MG/ML SOLN COMPARISON:  PET-CT recently 12/09/2022, and CTA chest, abdomen and pelvis 11/16/2022. FINDINGS: CTA CHEST FINDINGS Cardiovascular: The cardiac size is normal. No arterial dilatation or embolism is seen. Minimal pericardial effusion anteriorly. Scattered calcific plaques lad coronary artery. Mild aortic atherosclerosis. There is no aneurysm, stenosis or dissection. The great vessels are widely patent. Pulmonary veins are nondistended. Mediastinum/Nodes: Enlarged superior left hilar lymph node is 1.3 cm in short axis, previously 1.1 cm. No other intrathoracic adenopathy is seen. 1.2 cm hypodense nodule in the inferior pole of the left lobe of the thyroid gland is unchanged. No follow-up imaging is recommended. Lungs/Pleura: 1.5 cm pulmonary metastasis left upper lobe, level of  the aortic arch, unchanged from the PET-CT, 11/16/2022 was 1.2 cm. There are several new nodules in the right middle lobe, most are 3 mm or smaller for example on series 13 axial images 80 6-99. Largest of these is 5 mm anteriorly on 13:90, concerning for new metastases. In the right lower lobe there is a new 5 mm nodule about the fissure on 13:90. Mild  paraseptal emphysema again seen in the lung apices. There are coarse atelectatic bands in the posterior bases. On the left there is increased conspicuity of fissural nodules in the lower lung field, largest of these is about 3 mm. There is a new nodule in the left lower lobe measuring 5 mm on 13:99 and a new 3 mm apical left lung nodule on 13:44. There is no pleural effusion, no other new lung abnormality. No focal consolidation. Musculoskeletal: There is chronic interbody fusion hardware in the lower cervical spine. Multifocal lytic metastatic disease particularly in the thoracic spine but also in the ribs and sternum, with pathologic compression fracture of the T3 vertebral body, worsening lytic metastasis T4 body with extension into the spinal canal and increased erosion through the posterior cortex, with increased soft tissue infiltration into the left T3-4 foramen. Similar increased posterior cortical erosion and metastatic extension into the spinal canal at T8. Mass effect on the cord could be present at these levels. Review of the MIP images confirms the above findings. CT ABDOMEN and PELVIS FINDINGS Hepatobiliary: Innumerable diffuse metastases to the liver largest is in segment 2, today 7.5 cm, on the PET-CT 7.4 cm, the next largest is medially in segment 5 measuring 3.9 cm, previously 3.3 cm. Gallbladder is absent without bile duct dilatation. Pancreas: No abnormality. Spleen: New scattered nonspecific subcentimeter hypodensities are noted in the inferior aspect of the spleen, possible metastases. Largest is 8 mm on 19:24. Adrenals/Urinary Tract: Stable 1 cm left adrenal nodule, was not hypermetabolic on PET-CT. No right adrenal or broad or renal mass. No urinary stone or obstruction. Unremarkable bladder for the degree of distention. Stomach/Bowel: Unremarkable contracted stomach. Normal caliber unopacified small bowel. Normal appendix. Moderate fecal stasis ascending and transverse colon. Distal  descending colon and sigmoid either demonstrating mild wall thickening or underdistention. Unremarkable rectum. Vascular/Lymphatic: Aortic atherosclerosis. No enlarged abdominal or pelvic lymph nodes. Reproductive: Status post hysterectomy. No adnexal masses. Multiple pelvic phleboliths. Other: No ascites or free air, no incarcerated hernia. Musculoskeletal: Progressive lytic metastatic disease, including compared with 12/09/2022, is noted at multiple levels of the lumbar spine, throughout the sacrum and bony pelvis, and with multiple small lytic lesions in both proximal femurs. There is increased pathologic compression fracture of the L5 vertebral body with soft tissue herniation into the spinal canal severely narrowing the spinal canal from the L4-5 to the L5-S1 level. There is an enhancing mass in the right dorsal paraspinous musculature at L2-3 measuring 2.3 cm and was seen on 12/09/2022. Destructive mass of S3 to the left today measures 3 cm, previously 2.6 cm. Review of the MIP images confirms the above findings. IMPRESSION: 1. No evidence of arterial dilatation or embolism. 2. 1.5 cm pulmonary metastasis left upper lobe, unchanged from the PET-CT, 1.2 cm on 11/16/2022. 3. Multiple new nodules in the right middle lobe, right lower lobe, and left apical lung, largest 5 mm, concerning for new metastases. 4. Increased conspicuity of fissural nodules in the left lower lung field. 5. Increased size of a left hilar lymph node. 6. Innumerable diffuse metastases to the liver, largest 7.5 cm  in segment 2, previously 7.4 cm. 7. New scattered subcentimeter hypodensities in the inferior spleen, possible metastases. 8. Progressive lytic metastatic disease throughout the spine, sacrum, bony pelvis, and proximal femurs. 9. Increased pathologic compression fracture of the T3 and L5 vertebral body with soft tissue herniation into the spinal canal severely narrowing the spinal canal from the L4-5 to the L5-S1 level, and in the  thoracic spine with increased soft tissue infiltration of the spinal canal at T4 and T8, and in the left T3-4 foramen. 10. Stable 2.3 cm enhancing mass in the right dorsal paraspinous musculature at L2-3. 11. Constipation. 12. Distal descending colon and sigmoid either demonstrating mild wall thickening or underdistention. Correlate clinically for underlying colitis. 13. Aortic and coronary artery atherosclerosis. Aortic Atherosclerosis (ICD10-I70.0) and Emphysema (ICD10-J43.9). Electronically Signed   By: Almira Bar M.D.   On: 12/28/2022 02:39   CT HEAD WO CONTRAST ( )  Result Date: 12/27/2022 CLINICAL DATA:  Delirium.  History of brain metastases. EXAM: CT HEAD WITHOUT CONTRAST TECHNIQUE: Contiguous axial images were obtained from the base of the skull through the vertex without intravenous contrast. RADIATION DOSE REDUCTION: This exam was performed according to the departmental dose-optimization program which includes automated exposure control, adjustment of the mA and/or kV according to patient size and/or use of iterative reconstruction technique. COMPARISON:  Head CT 11/11/2022.  MRI brain 12/17/2022. FINDINGS: Brain: No evidence of acute infarction, hemorrhage, hydrocephalus, or extra-axial fluid collection. There are 2 rounded hyperdense metastatic lesions in the right frontal cortex seen best on coronal imaging. These are unchanged from prior MRI. There is also 2 mm hyperdense metastatic lesion in the right occipital region best seen on coronal image 5/46. This is not well appreciated on prior MRI. There is no mass effect. Minimal patchy periventricular white matter hypodensity appears similar to prior. Vascular: No hyperdense vessel or unexpected calcification. Skull: Normal. Negative for fracture or focal lesion. Sinuses/Orbits: No acute finding. Other: None. IMPRESSION: 1. No acute intracranial process. 2. Two stable rounded hyperdense metastatic lesions in the right frontal cortex. There is  also a 2 mm hyperdense metastatic lesion in the right occipital region which is not well appreciated on prior MRI. No mass effect. Electronically Signed   By: Darliss Cheney M.D.   On: 12/27/2022 23:20   DG Chest Port 1 View  Result Date: 12/27/2022 CLINICAL DATA:  Questionable sepsis - evaluate for abnormality EXAM: PORTABLE CHEST 1 VIEW COMPARISON:  Chest x-ray 11/16/2022, CT angio chest 11/16/2022, PET CT 12/09/2022 FINDINGS: The heart and mediastinal contours are within normal limits. Aortic calcification. Redemonstration of an approximate 1.6 cm left upper quadrant pulmonary better evaluated on PET-CT 12/09/2022. No focal consolidation. No pulmonary edema. No pleural effusion. No pneumothorax. No acute osseous abnormality.  Cervical spine surgical hardware. IMPRESSION: 1. No acute cardiopulmonary abnormality. 2. Redemonstration of an approximate 1.6 cm left upper quadrant pulmonary better evaluated on PET-CT 12/09/2022. 3.  Aortic Atherosclerosis (ICD10-I70.0). Electronically Signed   By: Tish Frederickson M.D.   On: 12/27/2022 23:05   MR BRAIN W WO CONTRAST  Addendum Date: 12/17/2022   ADDENDUM REPORT: 12/17/2022 13:41 ADDENDUM: Study discussed by telephone with [name] COURTNEY WHARTON on 12/17/2022 at 1332 hours. Electronically Signed   By: Odessa Fleming M.D.   On: 12/17/2022 13:41   Result Date: 12/17/2022 CLINICAL DATA:  57 year old female with metastatic melanoma. Staging. Study under anesthesia. EXAM: MRI HEAD WITHOUT AND WITH CONTRAST TECHNIQUE: Multiplanar, multiecho pulse sequences of the brain and surrounding structures were obtained without and  with intravenous contrast. CONTRAST:  5mL GADAVIST GADOBUTROL 1 MMOL/ML IV SOLN COMPARISON:  PET-CT 12/09/2022.  Head CT 11/11/2022. FINDINGS: Brain: Several small intrinsic T1 hyperintense (series 8, image 37) and/or enhancing (series 19, image 30) brain lesions compatible with small melanoma metastases. Only 1 of these demonstrates evidence of hemosiderin  on SWI (series 7, image 72). No associated cerebral edema or mass effect. Three such small metastases are identified including 3-4 mm at the right inferior frontal gyrus on series 19, image 30, 3-4 mm at the right operculum image 35, and 2-3 mm at the right middle frontal gyrus on image 38. Although those lesions are relatively clustered, no additional brain metastases, abnormal enhancement, or abnormal susceptibility is identified elsewhere. No dural thickening. No superimposed restricted diffusion to suggest acute infarction. No midline shift, ventriculomegaly, extra-axial collection or acute intracranial hemorrhage. Cervicomedullary junction and pituitary are within normal limits. Scattered nonenhancing white matter T2 and FLAIR hyperintensity is moderate for age, nonspecific but most commonly small vessel related. Vascular: Major intracranial vascular flow voids are preserved. Dominant distal left vertebral artery. Following contrast major dural venous sinuses are enhancing and appear to be patent. Skull and upper cervical spine: Heterogeneous background bone marrow signal. Known osseous metastatic disease, but no destructive lesion identified in the skull or visible cervical spine. Grossly negative visible cervical spinal cord. Sinuses/Orbits: Negative orbits. Minor paranasal sinus mucosal thickening. Other: Minimal left mastoid effusion. Visible internal auditory structures appear normal. Intubated, study under anesthesia. Negative visible scalp and face. IMPRESSION: 1. Three small right hemisphere brain metastases metastases ranging from 2 mm to 4 mm. Minimal hemosiderin. No associated cerebral edema or mass effect. No other metastatic disease identified in the brain. 2. Known osseous metastatic disease but no destructive lesion identified in the skull or visible cervical spine. 3. Moderate for age cerebral white matter changes most commonly due to small vessel disease. Electronically Signed: By: Odessa Fleming M.D.  On: 12/17/2022 13:16   MR LUMBAR SPINE W WO CONTRAST  Addendum Date: 12/17/2022   ADDENDUM REPORT: 12/17/2022 13:40 ADDENDUM: Study discussed by telephone with [name] COURTNEY WHARTON on 12/17/2022 at 1332 hours. And note that CLINICAL DATA should read 57 year old female with metastatic melanoma. Staging. Study under anesthesia. Electronically Signed   By: Odessa Fleming M.D.   On: 12/17/2022 13:40   Result Date: 12/17/2022 EXAM: MRI LUMBAR SPINE WITHOUT AND WITH CONTRAST TECHNIQUE: Multiplanar and multiecho pulse sequences of the lumbar spine were obtained without and with intravenous contrast. CONTRAST:  5mL GADAVIST GADOBUTROL 1 MMOL/ML IV SOLN COMPARISON:  Brain MRI today reported separately. PET-CT 12/09/2022. CTA chest abdomen pelvis 11/16/2022. FINDINGS: Segmentation:  Normal on the comparison CTA. Alignment:  Mild straightening of lumbar lordosis now. Vertebrae: Diffuse skeletal metastatic disease throughout the visible spine and pelvis. Scout view demonstrates thoracic vertebral metastases with extension into the ventral epidural space at both the T4 and T8 levels (series 10, image 14). Pathologic fracture of the L5 vertebral body has progressed since 11/16/2022. L5 loss of height is now up to 60%, and there is retropulsed and/or epidural extension of tumor at that level (series 16, image 31), see additional spinal canal details below. Lumbar ventral epidural tumor also at the left L1 vertebral level without spinal stenosis (series 15, image 17), and at the L4 level on the right (series 15, image 25) without spinal stenosis. Incidental bulky extraosseous tumor also from the right L1 transverse process, 2.2 cm right paraspinal muscle mass there on series 15, image 7. No other  pathologic fracture identified. Conus medullaris and cauda equina: Conus extends to the L1 level. No lower spinal cord or conus signal abnormality. But there is an indistinct appearance of the surface of the lower thoracic spinal cord on  T2 imaging (series 12, image 8), and indeterminate enhancement pattern at the conus (series 17, image 7). No obvious nerve root thickening or enhancement in the cauda equina. Paraspinal and other soft tissues: Right L1 paraspinal muscle tumor arising from the posterior elements at that level as above. Partially visible extensive liver tumor. No retroperitoneal, prevertebral lymphadenopathy. Disc levels: No significant lumbar spine degeneration superimposed on the diffuse metastatic disease. Ventral epidural tumor at L2 and L4 without spinal stenosis as above. Bulky tumor retropulsion, epidural extension at the L5 pathologic compression fracture level now resulting in severe spinal stenosis (series 15, image 31 and series 12, image 8), severe lateral recess stenosis (S1 nerve levels) and severe proximal L5 foraminal stenosis. IMPRESSION: 1. Diffuse skeletal metastatic disease. Involvement throughout the visible spine and pelvis. 2. Progressed pathologic fracture of the L5 since 10/21/2024with 60% loss of height and bulky retropulsed/epidural tumor. 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 3. 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 And scout view demonstrates thoracic spine ventral epidural tumor at both T4 and T8 levels. 4. 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 Electronically Signed: By: Odessa Fleming M.D. On: 12/17/2022 13:28   NM PET Image Initial (PI) Whole Body  Result Date: 12/09/2022 CLINICAL DATA:  Initial treatment strategy for metastatic melanoma. EXAM: NUCLEAR MEDICINE PET WHOLE BODY TECHNIQUE: 6.6 mCi F-18 FDG was injected intravenously. Full-ring PET imaging was performed from the head to foot after the radiotracer. CT data was obtained and used for attenuation correction and anatomic localization. Fasting blood glucose: 115 mg/dl COMPARISON:  CTA chest abdomen pelvis dated 11/16/2022 FINDINGS:  Mediastinal blood pool activity: SUV max 2.1 HEAD/NECK: No hypermetabolic activity in the scalp. No hypermetabolic cervical lymph nodes. Incidental CT findings: none CHEST: 14 mm posterior left upper lobe pulmonary nodule (series 7/image 20), max SUV 8.7. 11 mm short axis left AP window node (series 4/image 82), max SUV 11.2. Incidental CT findings: Mild atherosclerotic calcifications of the aortic arch. Mild coronary atherosclerosis of the LAD. Mild centrilobular and paraseptal emphysematous changes, upper lung predominant. ABDOMEN/PELVIS: Multifocal hepatic metastases, approximately 10-15 in number, including: --7.4 cm mass in the lateral segment left hepatic lobe (series 4/image 112), max SUV 16.3 --2.9 cm mass in the medial aspect of segment 6 (series 4/image 122), max SUV 14.7 No abnormal hypermetabolism in the pancreas, spleen, or adrenal glands. No hypermetabolic abdominopelvic lymphadenopathy. Incidental CT findings: Status post cholecystectomy. Status post hysterectomy. Mild rectosigmoid colonic wall thickening (series 4/image 135), nonspecific. Atherosclerotic calcifications of the abdominal aorta and branch vessels. SKELETON: Multifocal/widespread osseous metastases, including: --Left humeral head, max SUV 6.4 --T4 vertebral body, max SUV 21.1 --Right lateral 6th rib, max SUV 4.2 --Right inferior sternum, max SUV 12.5 --L4 vertebral body, max SUV 16.3 --Left sacrum, max SUV 15.4 Hypermetabolism involving right anterior thigh musculature, favored to be physiologic. Incidental CT findings: none EXTREMITIES: No abnormal hypermetabolic activity in the lower extremities. Incidental CT findings: none IMPRESSION: Left upper lobe pulmonary metastasis, as above. Associated mediastinal nodal metastasis. Multifocal hepatic metastases, as above. Multifocal/widespread osseous metastases, as above. Electronically Signed   By: Charline Bills M.D.   On: 12/09/2022 23:34

## 2023-01-05 NOTE — Consult Note (Signed)
[facility] Liaison Consult Note    01/05/2023  Laura [name] 1966-01-20 [id number]  Insurance: Valinda Hoar [facility] Comm   Primary Care Provider: [name], Cordelia Poche, with Deboraha Sprang at [facility], this provider is listed for the transition of care follow up appointments  and Transition of care calls   [facility] Liaison screened the patient remotely at [facility].    The patient was screened for 7 day readmission hospitalization with noted high risk score for unplanned readmission risk 2 hospital admissions in 6 months.  The patient was assessed for potential Community Care Coordination service needs for post hospital transition for care coordination. Review of patient's electronic medical record reveals patient is for inpatient [facility] team notes for home needs.  No SDOH needs noted. Notes patient is also followed by Oncology and Palliative Care team noted  Plan: [facility] Liaison will continue to follow progress and disposition to asess for post hospital community care coordination/management needs.  Referral request for community care coordination: Patient to be followed by [facility] transition team, no VBCI needs noted.   VBCI Community Care, Population Health does not replace or interfere with any arrangements made by the Inpatient Transition of Care team.   For questions contact:   Charlesetta Shanks, RN, BSN, CCM [place]  [facility], Population Health, [facility] Liaison Direct Dial: [phone] or secure chat Email: [email or web address]

## 2023-01-05 NOTE — Progress Notes (Signed)
IVF ordered for Thursday 12/12, Monday 12/16, and Thursday 12/19 then as needed.  Labs ordered.   Follow up with me on Thursday 12/12 with CBC, CMP, LDH before visit. Ok to double book to whenever infusion is available to fit her in for IVF.  Follow up with me at 12:30 on Monday 12/16 and 10am on 12/19 with labs (CMP) before each visit.

## 2023-01-05 NOTE — Telephone Encounter (Signed)
Patient is aware of scheduled appointment times/dates for next infusion, patient has also requested a calendar, is also noted in her schedule for it to be printed out for her

## 2023-01-05 NOTE — TOC Transition Note (Addendum)
Transition of Care [facility]) - CM/SW Discharge Note   Patient Details  Name: Laura [name] MRN: [id number] Date of Birth: 1965-04-19  Transition of Care [facility]) CM/SW Contact:  Lanier Clam, RN Phone Number: 01/05/2023, 11:13 AM   Clinical Narrative:   Already has home 02, & travel tank.Noted order for home 02. No further CM needs. -12:27p-[employer] rep Marthann Schiller provides home 02-he will contact spouse about additional portable tanks.    Final next level of care: Home/Self Care Barriers to Discharge: No Barriers Identified   Patient Goals and CMS Choice      Discharge Placement                         Discharge Plan and Services Additional resources added to the After Visit Summary for                                       Social Determinants of Health (SDOH) Interventions SDOH Screenings   Food Insecurity: No Food Insecurity (01/03/2023)  Housing: Low Risk  (01/03/2023)  Transportation Needs: No Transportation Needs (01/03/2023)  Utilities: Not At Risk (01/03/2023)  Alcohol Screen: Low Risk  (12/11/2022)  Depression (PHQ2-9): Low Risk  (12/11/2022)  Tobacco Use: Medium Risk (12/31/2022)     Readmission Risk Interventions     No data to display

## 2023-01-05 NOTE — Progress Notes (Signed)
Discharge instructions discussed with patient and husband with written copy provided. Understanding verbalized. P  atient denies further needs.Transported to private vehicle via [facility] for DC home with CNA in attendance.

## 2023-01-05 NOTE — TOC Transition Note (Signed)
Transition of Care [facility]) - CM/SW Discharge Note   Patient Details  Name: Laura [name] MRN: [id number] Date of Birth: 12-05-1965  Transition of Care [facility]) CM/SW Contact:  Lanier Clam, RN Phone Number: 01/05/2023, 9:39 AM   Clinical Narrative: d/c home no needs.      Final next level of care: Home/Self Care Barriers to Discharge: No Barriers Identified   Patient Goals and CMS Choice      Discharge Placement                         Discharge Plan and Services Additional resources added to the After Visit Summary for                                       Social Determinants of Health (SDOH) Interventions SDOH Screenings   Food Insecurity: No Food Insecurity (01/03/2023)  Housing: Low Risk  (01/03/2023)  Transportation Needs: No Transportation Needs (01/03/2023)  Utilities: Not At Risk (01/03/2023)  Alcohol Screen: Low Risk  (12/11/2022)  Depression (PHQ2-9): Low Risk  (12/11/2022)  Tobacco Use: Medium Risk (12/31/2022)     Readmission Risk Interventions     No data to display

## 2023-01-05 NOTE — Plan of Care (Signed)

## 2023-01-05 NOTE — Plan of Care (Signed)
  Problem: Education: °Goal: Knowledge of General Education information will improve °Description: Including pain rating scale, medication(s)/side effects and non-pharmacologic comfort measures °Outcome: Progressing °  °Problem: Clinical Measurements: °Goal: Diagnostic test results will improve °Outcome: Progressing °  °Problem: Activity: °Goal: Risk for activity intolerance will decrease °Outcome: Progressing °  °Problem: Coping: °Goal: Level of anxiety will decrease °Outcome: Progressing °  °

## 2023-01-06 ENCOUNTER — Other Ambulatory Visit: Payer: BC Managed Care – PPO

## 2023-01-06 DIAGNOSIS — C4359 Malignant melanoma of other part of trunk: Secondary | ICD-10-CM | POA: Diagnosis not present

## 2023-01-06 DIAGNOSIS — Z5112 Encounter for antineoplastic immunotherapy: Secondary | ICD-10-CM | POA: Diagnosis not present

## 2023-01-06 DIAGNOSIS — R630 Anorexia: Secondary | ICD-10-CM | POA: Diagnosis not present

## 2023-01-06 DIAGNOSIS — C7801 Secondary malignant neoplasm of right lung: Secondary | ICD-10-CM | POA: Diagnosis not present

## 2023-01-06 DIAGNOSIS — K7689 Other specified diseases of liver: Secondary | ICD-10-CM | POA: Insufficient documentation

## 2023-01-06 DIAGNOSIS — C7931 Secondary malignant neoplasm of brain: Secondary | ICD-10-CM | POA: Diagnosis not present

## 2023-01-06 DIAGNOSIS — C7802 Secondary malignant neoplasm of left lung: Secondary | ICD-10-CM | POA: Diagnosis not present

## 2023-01-06 DIAGNOSIS — Z79620 Long term (current) use of immunosuppressive biologic: Secondary | ICD-10-CM | POA: Diagnosis not present

## 2023-01-06 DIAGNOSIS — C771 Secondary and unspecified malignant neoplasm of intrathoracic lymph nodes: Secondary | ICD-10-CM | POA: Diagnosis not present

## 2023-01-06 DIAGNOSIS — C439 Malignant melanoma of skin, unspecified: Secondary | ICD-10-CM

## 2023-01-06 DIAGNOSIS — C7951 Secondary malignant neoplasm of bone: Secondary | ICD-10-CM | POA: Diagnosis not present

## 2023-01-06 DIAGNOSIS — Z515 Encounter for palliative care: Secondary | ICD-10-CM | POA: Diagnosis not present

## 2023-01-06 DIAGNOSIS — G893 Neoplasm related pain (acute) (chronic): Secondary | ICD-10-CM | POA: Diagnosis not present

## 2023-01-06 DIAGNOSIS — C787 Secondary malignant neoplasm of liver and intrahepatic bile duct: Secondary | ICD-10-CM | POA: Diagnosis not present

## 2023-01-06 LAB — CBC WITH DIFFERENTIAL (CANCER CENTER ONLY)
Abs Immature Granulocytes: 0.76 10*3/uL — ABNORMAL HIGH (ref 0.00–0.07)
Basophils Absolute: 0.1 10*3/uL (ref 0.0–0.1)
Basophils Relative: 1 %
Eosinophils Absolute: 0.3 10*3/uL (ref 0.0–0.5)
Eosinophils Relative: 3 %
HCT: 33.4 % — ABNORMAL LOW (ref 36.0–46.0)
Hemoglobin: 10.7 g/dL — ABNORMAL LOW (ref 12.0–15.0)
Immature Granulocytes: 8 %
Lymphocytes Relative: 7 %
Lymphs Abs: 0.6 10*3/uL — ABNORMAL LOW (ref 0.7–4.0)
MCH: 30.6 pg (ref 26.0–34.0)
MCHC: 32 g/dL (ref 30.0–36.0)
MCV: 95.4 fL (ref 80.0–100.0)
Monocytes Absolute: 0.4 10*3/uL (ref 0.1–1.0)
Monocytes Relative: 4 %
Neutro Abs: 7.1 10*3/uL (ref 1.7–7.7)
Neutrophils Relative %: 77 %
Platelet Count: 330 10*3/uL (ref 150–400)
RBC: 3.5 MIL/uL — ABNORMAL LOW (ref 3.87–5.11)
RDW: 14.6 % (ref 11.5–15.5)
Smear Review: NORMAL
WBC Count: 9.1 10*3/uL (ref 4.0–10.5)
nRBC: 0.8 % — ABNORMAL HIGH (ref 0.0–0.2)

## 2023-01-06 LAB — CMP (CANCER CENTER ONLY)
ALT: 70 U/L — ABNORMAL HIGH (ref 0–44)
AST: 137 U/L — ABNORMAL HIGH (ref 15–41)
Albumin: 3 g/dL — ABNORMAL LOW (ref 3.5–5.0)
Alkaline Phosphatase: 592 U/L — ABNORMAL HIGH (ref 38–126)
Anion gap: 3 — ABNORMAL LOW (ref 5–15)
BUN: 20 mg/dL (ref 6–20)
CO2: 31 mmol/L (ref 22–32)
Calcium: 10.6 mg/dL — ABNORMAL HIGH (ref 8.9–10.3)
Chloride: 101 mmol/L (ref 98–111)
Creatinine: 0.99 mg/dL (ref 0.44–1.00)
GFR, Estimated: >60 mL/min (ref >60)
Glucose, Bld: 89 mg/dL (ref 70–99)
Potassium: 4 mmol/L (ref 3.5–5.1)
Sodium: 135 mmol/L (ref 135–145)
Total Bilirubin: 1.6 mg/dL — ABNORMAL HIGH (ref <1.2)
Total Protein: 6.6 g/dL (ref 6.5–8.1)

## 2023-01-06 LAB — LACTATE DEHYDROGENASE: LDH: 2718 U/L — ABNORMAL HIGH (ref 98–192)

## 2023-01-06 LAB — T4, FREE: Free T4: 1.14 ng/dL — ABNORMAL HIGH (ref 0.61–1.12)

## 2023-01-06 NOTE — Assessment & Plan Note (Signed)
Secondary to diffuse liver metastases

## 2023-01-06 NOTE — Progress Notes (Unsigned)
Patient Care Team: [name], [name] as PCP - General (Family Medicine) Pickenpack-Cousar, Arty Baumgartner, NP as Nurse Practitioner [facility] and Palliative Medicine)  Clinic Day:  01/07/2023  Referring physician: [name], [name]  ASSESSMENT & PLAN:   Assessment & Plan: Diagnosis: 11/25/2022. Diffuse liver, bone, lung metastases. Brain metastases after starting systemic treatment Treatment: 12/11/2022. First line ipi/nivo. Completed 2 cycles.  [name] is having more abdominal pain for the first time this week.  Appetite is not great.  We discussed concerning for timing of response.  Usually immunotherapy will take more time to work comparing to targeted therapy.  Since she is having more abdominal pain, and known liver metastases diffusely, consider trial of BRAF/MAC inhibitors for disease control. Discussed potential side effects. They understand. Her insurance only authorized vemurafenib and cobimetinib so we will order this.  Malignant melanoma metastatic to bone [facility]) Will try to get vemurafenib 960 mg twice daily, and cobimetinib 60 mg once daily asap for a month. Her insurance allows this rather than other combo Monitor for new systemic symptoms LFTs and EKG on day 15 and then montly x3 months  Monitor for diarrhea, nausea, rash, MSK pain, visual changes  Liver dysfunction Secondary to diffuse liver metastases  Cancer associated pain Will use dilaudid as needed Trial of ibuprofen 600 mg twice daily Continue follow-up with palliative care  Will start vemurafenib and cobimetinib any day when she gets the medication. Will hold on next cycle of immunotherapy. See me as scheduled next week.  She knows to contact our office if she develops concerns prior to her next appointment.  Melven Sartorius, MD  [facility] - A DEPT OF [facility] [address] [place] [address] Dept: [phone] Dept Fax:  [phone]   Orders Placed This Encounter  Procedures   CBC with Differential (Cancer Center Only)    Standing Status:   Future    Expiration Date:   01/07/2024   CMP (Cancer Center only)    Standing Status:   Future    Expiration Date:   01/07/2024   Lactate dehydrogenase    Standing Status:   Future    Expiration Date:   01/07/2024   EKG 12-Lead      CHIEF COMPLAINT:  CC: stage IV melanoma  Current Treatment:  ipi/nivo  INTERVAL HISTORY:  Laura [name] is here today for repeat clinical assessment. She denies fevers or chills.  Report of upper and lower abdominal pain, sweats, leg swelling both sides.  Report drinking about 30 oz a day. She had some smoothie. Appetite is the same and taking marinol.   No diarrhea, nausea, vomiting, constipation, difficulty urinating.  No headache, vision change.  Tramadol is not working. She is taking dilaudid about every 6 hours.  I have reviewed the past medical history, past surgical history, social history and family history with the patient and they are unchanged from previous note.  ALLERGIES:  is allergic to aspirin.  MEDICATIONS:  Current Outpatient Medications  Medication Sig Dispense Refill   cobimetinib fumarate (COTELLIC) 20 MG tablet Take 3 tablets (60 mg total) by mouth daily. Take on days 1-21. Repeat every 28 days. Avoid sun exposure. 63 tablet 0   HYDROmorphone (DILAUDID) 2 MG tablet Take 1 tablet (2 mg total) by mouth every 6 (six) hours as needed for severe pain (pain score 7-10). 30 tablet 0   vemurafenib (ZELBORAF) 240 MG tablet Take 4 tablets (960 mg total)  by mouth every 12 (twelve) hours. Take with water. 240 tablet 0   AMBIEN CR 6.25 MG CR tablet Take 6.25 mg by mouth at bedtime.     busPIRone (BUSPAR) 15 MG tablet Take 15 mg by mouth 2 (two) times daily.     cyclobenzaprine (FLEXERIL) 5 MG tablet Take 1 tablet (5 mg total) by mouth 3 (three) times daily as needed for muscle spasms. Don't take with dilaudid 30 tablet  0   dronabinol (MARINOL) 2.5 MG capsule Take 1-2 capsules (2.5-5 mg total) by mouth See admin instructions. Take 5 mg by mouth in the morning and 2.5 mg at bedtime 90 capsule 0   famotidine-calcium carbonate-magnesium hydroxide (PEPCID COMPLETE) 10-800-165 MG chewable tablet Chew 1 tablet by mouth in the morning.     LORazepam (ATIVAN) 0.5 MG tablet Take 1 tab 30 minutes before PET and 2 tabs before MRI (Patient taking differently: Take 0.5-1 mg by mouth See admin instructions. Take 0.5 mg (1 tablet) by mouth 30 minutes before PET scans and 1 mg (2 tablets) before MRI(s)) 30 tablet 0   metoprolol tartrate (LOPRESSOR) 25 MG tablet Take 1 tablet (25 mg total) by mouth 2 (two) times daily. 60 tablet 1   ondansetron (ZOFRAN-ODT) 4 MG disintegrating tablet Dissolve 1 tablet (4 mg total) by mouth every 8 (eight) hours as needed for nausea or vomiting. 20 tablet 0   pantoprazole (PROTONIX) 40 MG tablet Take 1 tablet (40 mg total) by mouth daily. 30 tablet 2   potassium chloride SA (KLOR-CON M) 20 MEQ tablet Take 1 tablet (20 mEq total) by mouth 2 (two) times daily for 5 days. 10 tablet 0   pregabalin (LYRICA) 75 MG capsule Take 1 capsule (75 mg total) by mouth every morning.     rosuvastatin (CRESTOR) 10 MG tablet Take 10 mg by mouth at bedtime.     senna-docusate (SENOKOT-S) 8.6-50 MG tablet Take 1 tablet by mouth 2 (two) times daily.     No current facility-administered medications for this visit.    HISTORY OF PRESENT ILLNESS:   Oncology History  Metastatic malignant melanoma (HCC)  11/16/2022 Imaging   She presented to the emergency room on 11/16/2022 for back pain x 2 weeks and central chest pain for several months   CTA CAP IMPRESSION: 1. Negative for aortic dissection or aneurysm. 2. 1.2 cm left upper lobe nodule.  For 3. Multiple ill-defined liver lesions, suspicious for metastatic disease. 4. Multiple lytic osseous lesions, suspicious for metastatic disease. Pathologic fracture of the  superior endplate of L5   11/25/2022 Pathology Results   1. Liver, needle/core biopsy, Left hepatic lobe lesion :       - METASTATIC MELANOMA.    12/04/2022 Initial Diagnosis   Melanoma of skin (HCC) Liver, bone metastases. 1.2 cm LUL nodule   12/09/2022 PET scan   PET: Diffuse bone and liver metastases.  Lung metastases.    12/11/2022 -  Chemotherapy   Patient is on Treatment Plan : MELANOMA Nivolumab (1) + Ipilimumab (3) q21d / Nivolumab (480) q28d     12/17/2022 Imaging   MRI brain 1. Three small right hemisphere brain metastases metastases ranging from 2 mm to 4 mm. Minimal hemosiderin. No associated cerebral edema or mass effect. No other metastatic disease identified in the brain.   2. Known osseous metastatic disease but no destructive lesion identified in the skull or visible cervical spine.   3. Moderate for age cerebral white matter changes most commonly due to small vessel  disease.       REVIEW OF SYSTEMS:   All relevant systems were reviewed with the patient and are negative.   VITALS:  Blood pressure 112/69, pulse (!) 122, temperature (!) 97.2 F (36.2 C), resp. rate 20, weight 139 lb 14.4 oz (63.5 kg), SpO2 94%.  Wt Readings from Last 3 Encounters:  01/07/23 139 lb 14.4 oz (63.5 kg)  01/05/23 131 lb 13.4 oz (59.8 kg)  12/31/22 132 lb (59.9 kg)    Body mass index is 24.78 kg/m.  Performance status (ECOG): 3 - Symptomatic, >50% confined to bed  PHYSICAL EXAM:   GENERAL: alert, no distress SKIN: skin color normal, no rashes  EYES: normal, sclera clear OROPHARYNX: no exudate, no erythema    NECK: supple,  non-tender, without nodularity LYMPH:  no palpable cervical lymphadenopathy LUNGS: clear to auscultation with normal breathing effort.  No wheeze or rales HEART: tachycardic ABDOMEN: abdomen soft, non-tender and nondistended Musculoskeletal: bilateral non pitting edema NEURO: alert, fluent speech, no focal motor/sensory deficits.  Strength and  sensation equal bilaterally.  LABORATORY DATA:  I have reviewed the data as listed    Component Value Date/Time   NA 135 01/06/2023 1531   K 4.0 01/06/2023 1531   CL 101 01/06/2023 1531   CO2 31 01/06/2023 1531   GLUCOSE 89 01/06/2023 1531   BUN 20 01/06/2023 1531   CREATININE 0.99 01/06/2023 1531   CALCIUM 10.6 (H) 01/06/2023 1531   PROT 6.6 01/06/2023 1531   ALBUMIN 3.0 (L) 01/06/2023 1531   AST 137 (H) 01/06/2023 1531   ALT 70 (H) 01/06/2023 1531   ALKPHOS 592 (H) 01/06/2023 1531   BILITOT 1.6 (H) 01/06/2023 1531   GFRNONAA >60 01/06/2023 1531   GFRAA >60 04/20/2018 0823    No results found for: "SPEP", "UPEP"  Lab Results  Component Value Date   WBC 9.1 01/06/2023   NEUTROABS 7.1 01/06/2023   HGB 10.7 (L) 01/06/2023   HCT 33.4 (L) 01/06/2023   MCV 95.4 01/06/2023   PLT 330 01/06/2023      Chemistry      Component Value Date/Time   NA 135 01/06/2023 1531   K 4.0 01/06/2023 1531   CL 101 01/06/2023 1531   CO2 31 01/06/2023 1531   BUN 20 01/06/2023 1531   CREATININE 0.99 01/06/2023 1531      Component Value Date/Time   CALCIUM 10.6 (H) 01/06/2023 1531   ALKPHOS 592 (H) 01/06/2023 1531   AST 137 (H) 01/06/2023 1531   ALT 70 (H) 01/06/2023 1531   BILITOT 1.6 (H) 01/06/2023 1531       RADIOGRAPHIC STUDIES: I have personally reviewed the radiological images as listed and agreed with the findings in the report. DG Chest 2 View Result Date: 01/03/2023 CLINICAL DATA:  Sepsis EXAM: CHEST - 2 VIEW COMPARISON:  12/27/2022 FINDINGS: The heart size and mediastinal contours are within normal limits. Trace pleural effusions. Unchanged left upper lobe lung nodule. The visualized skeletal structures are unremarkable. IMPRESSION: 1. Trace pleural effusions. No focal airspace opacity. 2.  Unchanged left upper lobe lung nodule. Electronically Signed   By: [name]   On: 01/03/2023 15:59   CT Angio Chest PE W and/or Wo Contrast Result Date: 12/28/2022 CLINICAL  DATA:  Patient with metastatic melanoma, new transaminitis, pulmonary embolism suspected. EXAM: CT ANGIOGRAPHY CHEST CT ABDOMEN AND PELVIS WITH CONTRAST TECHNIQUE: Multidetector CT imaging of the chest was performed using the standard protocol during bolus administration of intravenous contrast. Multiplanar CT image  reconstructions and MIPs were obtained to evaluate the vascular anatomy. Multidetector CT imaging of the abdomen and pelvis was performed using the standard protocol during bolus administration of intravenous contrast. RADIATION DOSE REDUCTION: This exam was performed according to the departmental dose-optimization program which includes automated exposure control, adjustment of the mA and/or kV according to patient size and/or use of iterative reconstruction technique. CONTRAST:  OMNIPAQUE IOHEXOL 350 MG/ML SOLN COMPARISON:  PET-CT recently 12/09/2022, and CTA chest, abdomen and pelvis 11/16/2022. FINDINGS: CTA CHEST FINDINGS Cardiovascular: The cardiac size is normal. No arterial dilatation or embolism is seen. Minimal pericardial effusion anteriorly. Scattered calcific plaques lad coronary artery. Mild aortic atherosclerosis. There is no aneurysm, stenosis or dissection. The great vessels are widely patent. Pulmonary veins are nondistended. Mediastinum/Nodes: Enlarged superior left hilar lymph node is 1.3 cm in short axis, previously 1.1 cm. No other intrathoracic adenopathy is seen. 1.2 cm hypodense nodule in the inferior pole of the left lobe of the thyroid gland is unchanged. No follow-up imaging is recommended. Lungs/Pleura: 1.5 cm pulmonary metastasis left upper lobe, level of the aortic arch, unchanged from the PET-CT, 11/16/2022 was 1.2 cm. There are several new nodules in the right middle lobe, most are 3 mm or smaller for example on series 13 axial images 80 6-99. Largest of these is 5 mm anteriorly on 13:90, concerning for new metastases. In the right lower lobe there is a new 5 mm  nodule about the fissure on 13:90. Mild paraseptal emphysema again seen in the lung apices. There are coarse atelectatic bands in the posterior bases. On the left there is increased conspicuity of fissural nodules in the lower lung field, largest of these is about 3 mm. There is a new nodule in the left lower lobe measuring 5 mm on 13:99 and a new 3 mm apical left lung nodule on 13:44. There is no pleural effusion, no other new lung abnormality. No focal consolidation. Musculoskeletal: There is chronic interbody fusion hardware in the lower cervical spine. Multifocal lytic metastatic disease particularly in the thoracic spine but also in the ribs and sternum, with pathologic compression fracture of the T3 vertebral body, worsening lytic metastasis T4 body with extension into the spinal canal and increased erosion through the posterior cortex, with increased soft tissue infiltration into the left T3-4 foramen. Similar increased posterior cortical erosion and metastatic extension into the spinal canal at T8. Mass effect on the cord could be present at these levels. Review of the MIP images confirms the above findings. CT ABDOMEN and PELVIS FINDINGS Hepatobiliary: Innumerable diffuse metastases to the liver largest is in segment 2, today 7.5 cm, on the PET-CT 7.4 cm, the next largest is medially in segment 5 measuring 3.9 cm, previously 3.3 cm. Gallbladder is absent without bile duct dilatation. Pancreas: No abnormality. Spleen: New scattered nonspecific subcentimeter hypodensities are noted in the inferior aspect of the spleen, possible metastases. Largest is 8 mm on 19:24. Adrenals/Urinary Tract: Stable 1 cm left adrenal nodule, was not hypermetabolic on PET-CT. No right adrenal or broad or renal mass. No urinary stone or obstruction. Unremarkable bladder for the degree of distention. Stomach/Bowel: Unremarkable contracted stomach. Normal caliber unopacified small bowel. Normal appendix. Moderate fecal stasis  ascending and transverse colon. Distal descending colon and sigmoid either demonstrating mild wall thickening or underdistention. Unremarkable rectum. Vascular/Lymphatic: Aortic atherosclerosis. No enlarged abdominal or pelvic lymph nodes. Reproductive: Status post hysterectomy. No adnexal masses. Multiple pelvic phleboliths. Other: No ascites or free air, no incarcerated hernia. Musculoskeletal: Progressive  lytic metastatic disease, including compared with 12/09/2022, is noted at multiple levels of the lumbar spine, throughout the sacrum and bony pelvis, and with multiple small lytic lesions in both proximal femurs. There is increased pathologic compression fracture of the L5 vertebral body with soft tissue herniation into the spinal canal severely narrowing the spinal canal from the L4-5 to the L5-S1 level. There is an enhancing mass in the right dorsal paraspinous musculature at L2-3 measuring 2.3 cm and was seen on 12/09/2022. Destructive mass of S3 to the left today measures 3 cm, previously 2.6 cm. Review of the MIP images confirms the above findings. IMPRESSION: 1. No evidence of arterial dilatation or embolism. 2. 1.5 cm pulmonary metastasis left upper lobe, unchanged from the PET-CT, 1.2 cm on 11/16/2022. 3. Multiple new nodules in the right middle lobe, right lower lobe, and left apical lung, largest 5 mm, concerning for new metastases. 4. Increased conspicuity of fissural nodules in the left lower lung field. 5. Increased size of a left hilar lymph node. 6. Innumerable diffuse metastases to the liver, largest 7.5 cm in segment 2, previously 7.4 cm. 7. New scattered subcentimeter hypodensities in the inferior spleen, possible metastases. 8. Progressive lytic metastatic disease throughout the spine, sacrum, bony pelvis, and proximal femurs. 9. Increased pathologic compression fracture of the T3 and L5 vertebral body with soft tissue herniation into the spinal canal severely narrowing the spinal canal from  the L4-5 to the L5-S1 level, and in the thoracic spine with increased soft tissue infiltration of the spinal canal at T4 and T8, and in the left T3-4 foramen. 10. Stable 2.3 cm enhancing mass in the right dorsal paraspinous musculature at L2-3. 11. Constipation. 12. Distal descending colon and sigmoid either demonstrating mild wall thickening or underdistention. Correlate clinically for underlying colitis. 13. Aortic and coronary artery atherosclerosis. Aortic Atherosclerosis (ICD10-I70.0) and Emphysema (ICD10-J43.9). Electronically Signed   By: Almira Bar M.D.   On: 12/28/2022 02:39   CT ABDOMEN PELVIS W CONTRAST Result Date: 12/28/2022 CLINICAL DATA:  Patient with metastatic melanoma, new transaminitis, pulmonary embolism suspected. EXAM: CT ANGIOGRAPHY CHEST CT ABDOMEN AND PELVIS WITH CONTRAST TECHNIQUE: Multidetector CT imaging of the chest was performed using the standard protocol during bolus administration of intravenous contrast. Multiplanar CT image reconstructions and MIPs were obtained to evaluate the vascular anatomy. Multidetector CT imaging of the abdomen and pelvis was performed using the standard protocol during bolus administration of intravenous contrast. RADIATION DOSE REDUCTION: This exam was performed according to the departmental dose-optimization program which includes automated exposure control, adjustment of the mA and/or kV according to patient size and/or use of iterative reconstruction technique. CONTRAST:  OMNIPAQUE IOHEXOL 350 MG/ML SOLN COMPARISON:  PET-CT recently 12/09/2022, and CTA chest, abdomen and pelvis 11/16/2022. FINDINGS: CTA CHEST FINDINGS Cardiovascular: The cardiac size is normal. No arterial dilatation or embolism is seen. Minimal pericardial effusion anteriorly. Scattered calcific plaques lad coronary artery. Mild aortic atherosclerosis. There is no aneurysm, stenosis or dissection. The great vessels are widely patent. Pulmonary veins are nondistended.  Mediastinum/Nodes: Enlarged superior left hilar lymph node is 1.3 cm in short axis, previously 1.1 cm. No other intrathoracic adenopathy is seen. 1.2 cm hypodense nodule in the inferior pole of the left lobe of the thyroid gland is unchanged. No follow-up imaging is recommended. Lungs/Pleura: 1.5 cm pulmonary metastasis left upper lobe, level of the aortic arch, unchanged from the PET-CT, 11/16/2022 was 1.2 cm. There are several new nodules in the right middle lobe, most are 3 mm  or smaller for example on series 13 axial images 80 6-99. Largest of these is 5 mm anteriorly on 13:90, concerning for new metastases. In the right lower lobe there is a new 5 mm nodule about the fissure on 13:90. Mild paraseptal emphysema again seen in the lung apices. There are coarse atelectatic bands in the posterior bases. On the left there is increased conspicuity of fissural nodules in the lower lung field, largest of these is about 3 mm. There is a new nodule in the left lower lobe measuring 5 mm on 13:99 and a new 3 mm apical left lung nodule on 13:44. There is no pleural effusion, no other new lung abnormality. No focal consolidation. Musculoskeletal: There is chronic interbody fusion hardware in the lower cervical spine. Multifocal lytic metastatic disease particularly in the thoracic spine but also in the ribs and sternum, with pathologic compression fracture of the T3 vertebral body, worsening lytic metastasis T4 body with extension into the spinal canal and increased erosion through the posterior cortex, with increased soft tissue infiltration into the left T3-4 foramen. Similar increased posterior cortical erosion and metastatic extension into the spinal canal at T8. Mass effect on the cord could be present at these levels. Review of the MIP images confirms the above findings. CT ABDOMEN and PELVIS FINDINGS Hepatobiliary: Innumerable diffuse metastases to the liver largest is in segment 2, today 7.5 cm, on the PET-CT 7.4 cm,  the next largest is medially in segment 5 measuring 3.9 cm, previously 3.3 cm. Gallbladder is absent without bile duct dilatation. Pancreas: No abnormality. Spleen: New scattered nonspecific subcentimeter hypodensities are noted in the inferior aspect of the spleen, possible metastases. Largest is 8 mm on 19:24. Adrenals/Urinary Tract: Stable 1 cm left adrenal nodule, was not hypermetabolic on PET-CT. No right adrenal or broad or renal mass. No urinary stone or obstruction. Unremarkable bladder for the degree of distention. Stomach/Bowel: Unremarkable contracted stomach. Normal caliber unopacified small bowel. Normal appendix. Moderate fecal stasis ascending and transverse colon. Distal descending colon and sigmoid either demonstrating mild wall thickening or underdistention. Unremarkable rectum. Vascular/Lymphatic: Aortic atherosclerosis. No enlarged abdominal or pelvic lymph nodes. Reproductive: Status post hysterectomy. No adnexal masses. Multiple pelvic phleboliths. Other: No ascites or free air, no incarcerated hernia. Musculoskeletal: Progressive lytic metastatic disease, including compared with 12/09/2022, is noted at multiple levels of the lumbar spine, throughout the sacrum and bony pelvis, and with multiple small lytic lesions in both proximal femurs. There is increased pathologic compression fracture of the L5 vertebral body with soft tissue herniation into the spinal canal severely narrowing the spinal canal from the L4-5 to the L5-S1 level. There is an enhancing mass in the right dorsal paraspinous musculature at L2-3 measuring 2.3 cm and was seen on 12/09/2022. Destructive mass of S3 to the left today measures 3 cm, previously 2.6 cm. Review of the MIP images confirms the above findings. IMPRESSION: 1. No evidence of arterial dilatation or embolism. 2. 1.5 cm pulmonary metastasis left upper lobe, unchanged from the PET-CT, 1.2 cm on 11/16/2022. 3. Multiple new nodules in the right middle lobe, right  lower lobe, and left apical lung, largest 5 mm, concerning for new metastases. 4. Increased conspicuity of fissural nodules in the left lower lung field. 5. Increased size of a left hilar lymph node. 6. Innumerable diffuse metastases to the liver, largest 7.5 cm in segment 2, previously 7.4 cm. 7. New scattered subcentimeter hypodensities in the inferior spleen, possible metastases. 8. Progressive lytic metastatic disease throughout the spine,  sacrum, bony pelvis, and proximal femurs. 9. Increased pathologic compression fracture of the T3 and L5 vertebral body with soft tissue herniation into the spinal canal severely narrowing the spinal canal from the L4-5 to the L5-S1 level, and in the thoracic spine with increased soft tissue infiltration of the spinal canal at T4 and T8, and in the left T3-4 foramen. 10. Stable 2.3 cm enhancing mass in the right dorsal paraspinous musculature at L2-3. 11. Constipation. 12. Distal descending colon and sigmoid either demonstrating mild wall thickening or underdistention. Correlate clinically for underlying colitis. 13. Aortic and coronary artery atherosclerosis. Aortic Atherosclerosis (ICD10-I70.0) and Emphysema (ICD10-J43.9). Electronically Signed   By: Almira Bar M.D.   On: 12/28/2022 02:39   CT HEAD WO CONTRAST ( ) Result Date: 12/27/2022 CLINICAL DATA:  Delirium.  History of brain metastases. EXAM: CT HEAD WITHOUT CONTRAST TECHNIQUE: Contiguous axial images were obtained from the base of the skull through the vertex without intravenous contrast. RADIATION DOSE REDUCTION: This exam was performed according to the departmental dose-optimization program which includes automated exposure control, adjustment of the mA and/or kV according to patient size and/or use of iterative reconstruction technique. COMPARISON:  Head CT 11/11/2022.  MRI brain 12/17/2022. FINDINGS: Brain: No evidence of acute infarction, hemorrhage, hydrocephalus, or extra-axial fluid collection. There  are 2 rounded hyperdense metastatic lesions in the right frontal cortex seen best on coronal imaging. These are unchanged from prior MRI. There is also 2 mm hyperdense metastatic lesion in the right occipital region best seen on coronal image 5/46. This is not well appreciated on prior MRI. There is no mass effect. Minimal patchy periventricular white matter hypodensity appears similar to prior. Vascular: No hyperdense vessel or unexpected calcification. Skull: Normal. Negative for fracture or focal lesion. Sinuses/Orbits: No acute finding. Other: None. IMPRESSION: 1. No acute intracranial process. 2. Two stable rounded hyperdense metastatic lesions in the right frontal cortex. There is also a 2 mm hyperdense metastatic lesion in the right occipital region which is not well appreciated on prior MRI. No mass effect. Electronically Signed   By: Darliss Cheney M.D.   On: 12/27/2022 23:20   DG Chest Port 1 View Result Date: 12/27/2022 CLINICAL DATA:  Questionable sepsis - evaluate for abnormality EXAM: PORTABLE CHEST 1 VIEW COMPARISON:  Chest x-ray 11/16/2022, CT angio chest 11/16/2022, PET CT 12/09/2022 FINDINGS: The heart and mediastinal contours are within normal limits. Aortic calcification. Redemonstration of an approximate 1.6 cm left upper quadrant pulmonary better evaluated on PET-CT 12/09/2022. No focal consolidation. No pulmonary edema. No pleural effusion. No pneumothorax. No acute osseous abnormality.  Cervical spine surgical hardware. IMPRESSION: 1. No acute cardiopulmonary abnormality. 2. Redemonstration of an approximate 1.6 cm left upper quadrant pulmonary better evaluated on PET-CT 12/09/2022. 3.  Aortic Atherosclerosis (ICD10-I70.0). Electronically Signed   By: Tish Frederickson M.D.   On: 12/27/2022 23:05   MR BRAIN W WO CONTRAST Addendum Date: 12/17/2022 ADDENDUM REPORT: 12/17/2022 13:41 ADDENDUM: Study discussed by telephone with [name] COURTNEY WHARTON on 12/17/2022 at 1332 hours. Electronically  Signed   By: Odessa Fleming M.D.   On: 12/17/2022 13:41   Result Date: 12/17/2022 CLINICAL DATA:  57 year old female with metastatic melanoma. Staging. Study under anesthesia. EXAM: MRI HEAD WITHOUT AND WITH CONTRAST TECHNIQUE: Multiplanar, multiecho pulse sequences of the brain and surrounding structures were obtained without and with intravenous contrast. CONTRAST:  5mL GADAVIST GADOBUTROL 1 MMOL/ML IV SOLN COMPARISON:  PET-CT 12/09/2022.  Head CT 11/11/2022. FINDINGS: Brain: Several small intrinsic T1 hyperintense (series 8, image  37) and/or enhancing (series 19, image 30) brain lesions compatible with small melanoma metastases. Only 1 of these demonstrates evidence of hemosiderin on SWI (series 7, image 72). No associated cerebral edema or mass effect. Three such small metastases are identified including 3-4 mm at the right inferior frontal gyrus on series 19, image 30, 3-4 mm at the right operculum image 35, and 2-3 mm at the right middle frontal gyrus on image 38. Although those lesions are relatively clustered, no additional brain metastases, abnormal enhancement, or abnormal susceptibility is identified elsewhere. No dural thickening. No superimposed restricted diffusion to suggest acute infarction. No midline shift, ventriculomegaly, extra-axial collection or acute intracranial hemorrhage. Cervicomedullary junction and pituitary are within normal limits. Scattered nonenhancing white matter T2 and FLAIR hyperintensity is moderate for age, nonspecific but most commonly small vessel related. Vascular: Major intracranial vascular flow voids are preserved. Dominant distal left vertebral artery. Following contrast major dural venous sinuses are enhancing and appear to be patent. Skull and upper cervical spine: Heterogeneous background bone marrow signal. Known osseous metastatic disease, but no destructive lesion identified in the skull or visible cervical spine. Grossly negative visible cervical spinal cord.  Sinuses/Orbits: Negative orbits. Minor paranasal sinus mucosal thickening. Other: Minimal left mastoid effusion. Visible internal auditory structures appear normal. Intubated, study under anesthesia. Negative visible scalp and face. IMPRESSION: 1. Three small right hemisphere brain metastases metastases ranging from 2 mm to 4 mm. Minimal hemosiderin. No associated cerebral edema or mass effect. No other metastatic disease identified in the brain. 2. Known osseous metastatic disease but no destructive lesion identified in the skull or visible cervical spine. 3. Moderate for age cerebral white matter changes most commonly due to small vessel disease. Electronically Signed: By: Odessa Fleming M.D. On: 12/17/2022 13:16   MR LUMBAR SPINE W WO CONTRAST Addendum Date: 12/17/2022 ADDENDUM REPORT: 12/17/2022 13:40 ADDENDUM: Study discussed by telephone with [name] COURTNEY WHARTON on 12/17/2022 at 1332 hours. And note that CLINICAL DATA should read 57 year old female with metastatic melanoma. Staging. Study under anesthesia. Electronically Signed   By: Odessa Fleming M.D.   On: 12/17/2022 13:40   Result Date: 12/17/2022 EXAM: MRI LUMBAR SPINE WITHOUT AND WITH CONTRAST TECHNIQUE: Multiplanar and multiecho pulse sequences of the lumbar spine were obtained without and with intravenous contrast. CONTRAST:  5mL GADAVIST GADOBUTROL 1 MMOL/ML IV SOLN COMPARISON:  Brain MRI today reported separately. PET-CT 12/09/2022. CTA chest abdomen pelvis 11/16/2022. FINDINGS: Segmentation:  Normal on the comparison CTA. Alignment:  Mild straightening of lumbar lordosis now. Vertebrae: Diffuse skeletal metastatic disease throughout the visible spine and pelvis. Scout view demonstrates thoracic vertebral metastases with extension into the ventral epidural space at both the T4 and T8 levels (series 10, image 14). Pathologic fracture of the L5 vertebral body has progressed since 11/16/2022. L5 loss of height is now up to 60%, and there is retropulsed  and/or epidural extension of tumor at that level (series 16, image 31), see additional spinal canal details below. Lumbar ventral epidural tumor also at the left L1 vertebral level without spinal stenosis (series 15, image 17), and at the L4 level on the right (series 15, image 25) without spinal stenosis. Incidental bulky extraosseous tumor also from the right L1 transverse process, 2.2 cm right paraspinal muscle mass there on series 15, image 7. No other pathologic fracture identified. Conus medullaris and cauda equina: Conus extends to the L1 level. No lower spinal cord or conus signal abnormality. But there is an indistinct appearance of the surface of the  lower thoracic spinal cord on T2 imaging (series 12, image 8), and indeterminate enhancement pattern at the conus (series 17, image 7). No obvious nerve root thickening or enhancement in the cauda equina. Paraspinal and other soft tissues: Right L1 paraspinal muscle tumor arising from the posterior elements at that level as above. Partially visible extensive liver tumor. No retroperitoneal, prevertebral lymphadenopathy. Disc levels: No significant lumbar spine degeneration superimposed on the diffuse metastatic disease. Ventral epidural tumor at L2 and L4 without spinal stenosis as above. Bulky tumor retropulsion, epidural extension at the L5 pathologic compression fracture level now resulting in severe spinal stenosis (series 15, image 31 and series 12, image 8), severe lateral recess stenosis (S1 nerve levels) and severe proximal L5 foraminal stenosis. IMPRESSION: 1. Diffuse skeletal metastatic disease. Involvement throughout the visible spine and pelvis. 2. Progressed pathologic fracture of the L5 since 10/21/2024with 60% loss of height and bulky retropulsed/epidural tumor. 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 3. 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 And scout view demonstrates thoracic spine  ventral epidural tumor at both T4 and T8 levels. 4. 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 Electronically Signed: By: Odessa Fleming M.D. On: 12/17/2022 13:28   NM PET Image Initial (PI) Whole Body Result Date: 12/09/2022 CLINICAL DATA:  Initial treatment strategy for metastatic melanoma. EXAM: NUCLEAR MEDICINE PET WHOLE BODY TECHNIQUE: 6.6 mCi F-18 FDG was injected intravenously. Full-ring PET imaging was performed from the head to foot after the radiotracer. CT data was obtained and used for attenuation correction and anatomic localization. Fasting blood glucose: 115 mg/dl COMPARISON:  CTA chest abdomen pelvis dated 11/16/2022 FINDINGS: Mediastinal blood pool activity: SUV max 2.1 HEAD/NECK: No hypermetabolic activity in the scalp. No hypermetabolic cervical lymph nodes. Incidental CT findings: none CHEST: 14 mm posterior left upper lobe pulmonary nodule (series 7/image 20), max SUV 8.7. 11 mm short axis left AP window node (series 4/image 82), max SUV 11.2. Incidental CT findings: Mild atherosclerotic calcifications of the aortic arch. Mild coronary atherosclerosis of the LAD. Mild centrilobular and paraseptal emphysematous changes, upper lung predominant. ABDOMEN/PELVIS: Multifocal hepatic metastases, approximately 10-15 in number, including: --7.4 cm mass in the lateral segment left hepatic lobe (series 4/image 112), max SUV 16.3 --2.9 cm mass in the medial aspect of segment 6 (series 4/image 122), max SUV 14.7 No abnormal hypermetabolism in the pancreas, spleen, or adrenal glands. No hypermetabolic abdominopelvic lymphadenopathy. Incidental CT findings: Status post cholecystectomy. Status post hysterectomy. Mild rectosigmoid colonic wall thickening (series 4/image 135), nonspecific. Atherosclerotic calcifications of the abdominal aorta and branch vessels. SKELETON: Multifocal/widespread osseous metastases, including: --Left humeral head, max SUV 6.4 --T4 vertebral  body, max SUV 21.1 --Right lateral 6th rib, max SUV 4.2 --Right inferior sternum, max SUV 12.5 --L4 vertebral body, max SUV 16.3 --Left sacrum, max SUV 15.4 Hypermetabolism involving right anterior thigh musculature, favored to be physiologic. Incidental CT findings: none EXTREMITIES: No abnormal hypermetabolic activity in the lower extremities. Incidental CT findings: none IMPRESSION: Left upper lobe pulmonary metastasis, as above. Associated mediastinal nodal metastasis. Multifocal hepatic metastases, as above. Multifocal/widespread osseous metastases, as above. Electronically Signed   By: Charline Bills M.D.   On: 12/09/2022 23:34

## 2023-01-06 NOTE — Assessment & Plan Note (Signed)
Will try to get vemurafenib 960 mg twice daily, and cobimetinib 60 mg once daily asap for a month. Her insurance allows this rather than other combo Monitor for new systemic symptoms LFTs and EKG on day 15 and then montly x3 months  Monitor for diarrhea, nausea, rash, MSK pain, visual changes

## 2023-01-07 ENCOUNTER — Telehealth: Payer: Self-pay | Admitting: Pharmacy Technician

## 2023-01-07 ENCOUNTER — Other Ambulatory Visit (HOSPITAL_COMMUNITY): Payer: Self-pay

## 2023-01-07 ENCOUNTER — Telehealth: Payer: Self-pay

## 2023-01-07 ENCOUNTER — Ambulatory Visit: Payer: BC Managed Care – PPO

## 2023-01-07 ENCOUNTER — Inpatient Hospital Stay (HOSPITAL_BASED_OUTPATIENT_CLINIC_OR_DEPARTMENT_OTHER): Payer: BC Managed Care – PPO

## 2023-01-07 ENCOUNTER — Encounter: Payer: BC Managed Care – PPO | Admitting: Dietician

## 2023-01-07 VITALS — BP 112/69 | HR 122 | Temp 97.2°F | Resp 20 | Wt 139.9 lb

## 2023-01-07 VITALS — BP 110/70 | HR 120 | Resp 20

## 2023-01-07 DIAGNOSIS — C7951 Secondary malignant neoplasm of bone: Secondary | ICD-10-CM

## 2023-01-07 DIAGNOSIS — E860 Dehydration: Secondary | ICD-10-CM

## 2023-01-07 DIAGNOSIS — Z515 Encounter for palliative care: Secondary | ICD-10-CM | POA: Diagnosis not present

## 2023-01-07 DIAGNOSIS — C787 Secondary malignant neoplasm of liver and intrahepatic bile duct: Secondary | ICD-10-CM | POA: Diagnosis not present

## 2023-01-07 DIAGNOSIS — Z79620 Long term (current) use of immunosuppressive biologic: Secondary | ICD-10-CM | POA: Diagnosis not present

## 2023-01-07 DIAGNOSIS — C7801 Secondary malignant neoplasm of right lung: Secondary | ICD-10-CM | POA: Diagnosis not present

## 2023-01-07 DIAGNOSIS — C4359 Malignant melanoma of other part of trunk: Secondary | ICD-10-CM | POA: Diagnosis not present

## 2023-01-07 DIAGNOSIS — C7931 Secondary malignant neoplasm of brain: Secondary | ICD-10-CM | POA: Diagnosis not present

## 2023-01-07 DIAGNOSIS — K7689 Other specified diseases of liver: Secondary | ICD-10-CM

## 2023-01-07 DIAGNOSIS — C771 Secondary and unspecified malignant neoplasm of intrathoracic lymph nodes: Secondary | ICD-10-CM | POA: Diagnosis not present

## 2023-01-07 DIAGNOSIS — R630 Anorexia: Secondary | ICD-10-CM | POA: Diagnosis not present

## 2023-01-07 DIAGNOSIS — Z9189 Other specified personal risk factors, not elsewhere classified: Secondary | ICD-10-CM

## 2023-01-07 DIAGNOSIS — G893 Neoplasm related pain (acute) (chronic): Secondary | ICD-10-CM | POA: Diagnosis not present

## 2023-01-07 DIAGNOSIS — Z5112 Encounter for antineoplastic immunotherapy: Secondary | ICD-10-CM | POA: Diagnosis not present

## 2023-01-07 DIAGNOSIS — C7802 Secondary malignant neoplasm of left lung: Secondary | ICD-10-CM | POA: Diagnosis not present

## 2023-01-07 LAB — TSH: TSH: 10.879 u[IU]/mL — ABNORMAL HIGH (ref 0.350–4.500)

## 2023-01-07 MED ORDER — TRAMETINIB DIMETHYL SULFOXIDE 2 MG PO TABS: 2.0000 mg | ORAL_TABLET | Freq: Every day | ORAL | 1 refills | Status: DC

## 2023-01-07 MED ORDER — VEMURAFENIB 240 MG PO TABS: 960.0000 mg | ORAL_TABLET | Freq: Two times a day (BID) | ORAL | 0 refills | Status: DC

## 2023-01-07 MED ORDER — CYCLOBENZAPRINE HCL 5 MG PO TABS
5.0000 mg | ORAL_TABLET | Freq: Three times a day (TID) | ORAL | 0 refills | Status: DC | PRN
  Filled 2023-01-07: qty 30, 10d supply, fill #0

## 2023-01-07 MED ORDER — DRONABINOL 2.5 MG PO CAPS
2.5000 mg | ORAL_CAPSULE | ORAL | 0 refills | Status: DC
  Filled 2023-01-07: qty 90, 30d supply, fill #0

## 2023-01-07 MED ORDER — DABRAFENIB MESYLATE 75 MG PO CAPS: 150.0000 mg | ORAL_CAPSULE | Freq: Two times a day (BID) | ORAL | 1 refills | Status: DC

## 2023-01-07 MED ORDER — PANTOPRAZOLE SODIUM 40 MG PO TBEC
40.0000 mg | DELAYED_RELEASE_TABLET | Freq: Every day | ORAL | 2 refills | Status: DC
  Filled 2023-01-07 – 2023-01-12 (×2): qty 30, 30d supply, fill #0
  Filled 2023-01-12 – 2023-02-09 (×2): qty 30, 30d supply, fill #1

## 2023-01-07 MED ORDER — HYDROMORPHONE HCL 2 MG PO TABS
2.0000 mg | ORAL_TABLET | Freq: Four times a day (QID) | ORAL | 0 refills | Status: DC | PRN
  Filled 2023-01-07: qty 30, 8d supply, fill #0

## 2023-01-07 MED ORDER — SODIUM CHLORIDE 0.9 % IV SOLN
INTRAVENOUS | Status: AC
Start: 2023-01-07 — End: 2023-01-07

## 2023-01-07 MED ORDER — COBIMETINIB FUMARATE 20 MG PO TABS: 60.0000 mg | ORAL_TABLET | Freq: Every day | ORAL | 0 refills | Status: DC

## 2023-01-07 NOTE — Assessment & Plan Note (Signed)
Will use dilaudid as needed Trial of ibuprofen 600 mg twice daily Continue follow-up with palliative care

## 2023-01-07 NOTE — Telephone Encounter (Signed)
Oral Oncology Patient Advocate Encounter  Prior Authorization for Zelboraf/Cotellic has been approved.    PA# [id number] Wynona Meals) PA#  [id number] (Cotellic) Effective dates: 01/07/23 through 01/07/24  Patient is required to fill at [facility].  Medications are copay card eligible.  Jinger Neighbors, CPhT-Adv Oncology Pharmacy Patient Advocate [facility] Direct Number: [phone]  Fax: [phone]

## 2023-01-07 NOTE — Telephone Encounter (Signed)
Oral Oncology Patient Advocate Encounter   Received notification that prior authorization for Zelboraf/Cotellic is required.   PA submitted on 01/07/23 Key (Zelboraf) [id number] Key (Cotellic) [id number] Status is pending     Jinger Neighbors, CPhT-Adv Oncology Pharmacy Patient Advocate [facility] Direct Number: [phone]  Fax: [phone]

## 2023-01-07 NOTE — Telephone Encounter (Signed)
Oral Oncology Pharmacist Encounter  Received new prescription for vemurafenib (Zelboraf) and cobimetinib (Cotellic) for the treatment of stage IV melanoma with BRAF [id number] mutaiton, planned duration until disease progression or unacceptable toxicity.  Labs from 01/06/2023 assessed, no interventions needed. Patients LFTs will need to be monitored frequently. Patient had baseline EKG on 01/03/23 Prescription dose and frequencies for both medication assessed for appropriateness.   Current medication list in Epic reviewed, no significant/ relevant DDIs with Zelboraf or Cotellic identified.  Evaluated chart and no patient barriers to medication adherence noted. Patient agreement for treatment documented in MD note on 01/07/2023.  Prescription has been e-scribed to the [facility] for benefits analysis and approval. Oral Oncology Clinic will continue to follow for insurance authorization, copayment issues, initial counseling and start date.  [name], PharmD Hematology/Oncology Clinical Pharmacist [facility] Oral Chemotherapy [facility] [phone] 01/07/2023 10:04 AM

## 2023-01-07 NOTE — Telephone Encounter (Signed)
Oral Oncology Patient Advocate Encounter   Received notification that prior authorization for Mekinist/Tafinlar is required.   PA submitted on 01/07/23 Key (Mekinist) [id number] Key Fabio Neighbors) [id number] Status is pending     Jinger Neighbors, CPhT-Adv Oncology Pharmacy Patient Advocate [facility] Direct Number: [phone]  Fax: [phone]

## 2023-01-07 NOTE — Patient Instructions (Signed)

## 2023-01-07 NOTE — Patient Instructions (Addendum)
Take iburprofen 600 mg twice daily as needed for pain with lots of fluid and after food. Take dilaudid one tab if pain  is severe. Do not take flexeril with dilaudid or Lyrica. Start the new medications when you get them. My pharmacist should be calling you about them.

## 2023-01-07 NOTE — Telephone Encounter (Signed)
Oral Oncology Patient Advocate Encounter  Mekinist/Tafinlar are non-preferred by patient's plan. Therapy will be changed to Zelboraf/Cotellic.  Jinger Neighbors, CPhT-Adv Oncology Pharmacy Patient Advocate [facility] Direct Number: [phone]  Fax: [phone]

## 2023-01-08 ENCOUNTER — Other Ambulatory Visit: Payer: Self-pay

## 2023-01-08 ENCOUNTER — Other Ambulatory Visit (HOSPITAL_COMMUNITY): Payer: Self-pay

## 2023-01-08 ENCOUNTER — Telehealth: Payer: Self-pay

## 2023-01-08 LAB — CULTURE, BLOOD (ROUTINE X 2)
Culture: NO GROWTH
Culture: NO GROWTH
Special Requests: ADEQUATE
Special Requests: ADEQUATE

## 2023-01-08 MED ORDER — DRONABINOL 2.5 MG PO CAPS: 2.5000 mg | ORAL_CAPSULE | ORAL | 0 refills | Status: DC

## 2023-01-08 NOTE — Progress Notes (Unsigned)
Palliative Medicine [facility]  [phone] [phone]   Name: Laura [name] Date: 01/08/2023 MRN: [id number]  DOB: 02-11-1965  Patient Care Team: [name], [name] as PCP - General (Family Medicine) Pickenpack-Cousar, Arty Baumgartner, NP as Nurse Practitioner [facility] and Palliative Medicine)    INTERVAL HISTORY: Laura [name] is a 57 y.o. female with oncologic medical history including malignant melanoma (10/2022) with metastatic disease to the bone. As well as a history of hypertension, hyperlipidemia, GERD, and arthritis. Palliative ask to see for symptom management and goals of care.   SOCIAL HISTORY:     reports that she quit smoking about 6 years ago. Her smoking use included cigarettes. She started smoking about 26 years ago. She has a 20 pack-year smoking history. She has never used smokeless tobacco. She reports that she does not drink alcohol and does not use drugs.  ADVANCE DIRECTIVES:  None on file  CODE STATUS: Full code  PAST MEDICAL HISTORY: Past Medical History:  Diagnosis Date  . Anxiety   . Arthritis   . Complication of anesthesia   . Fibromyalgia   . Gallstones 04/20/2018  . GERD (gastroesophageal reflux disease)   . History of blood transfusion    post Hysterectomy  . Hypercholesteremia   . Melanoma (HCC)    on back and excised  . Migraine   . PONV (postoperative nausea and vomiting)   . Tobacco abuse   . Vertebral artery disease (HCC)    history of left vertebral artery dissection 2011    ALLERGIES:  is allergic to aspirin.  MEDICATIONS:  Current Outpatient Medications  Medication Sig Dispense Refill  . AMBIEN CR 6.25 MG CR tablet Take 6.25 mg by mouth at bedtime.    . busPIRone (BUSPAR) 15 MG tablet Take 15 mg by mouth 2 (two) times daily.    . cobimetinib fumarate (COTELLIC) 20 MG tablet Take 3 tablets (60 mg total) by mouth daily. Take on days 1-21. Repeat every 28 days. Avoid sun exposure. 63  tablet 0  . cyclobenzaprine (FLEXERIL) 5 MG tablet Take 1 tablet (5 mg total) by mouth 3 (three) times daily as needed for muscle spasms. Don't take with dilaudid 30 tablet 0  . dronabinol (MARINOL) 2.5 MG capsule Take 1-2 capsules (2.5-5 mg total) by mouth See admin instructions. Take 5 mg by mouth in the morning and 2.5 mg at bedtime 90 capsule 0  . famotidine-calcium carbonate-magnesium hydroxide (PEPCID COMPLETE) 10-800-165 MG chewable tablet Chew 1 tablet by mouth in the morning.    Marland Kitchen HYDROmorphone (DILAUDID) 2 MG tablet Take 1 tablet (2 mg total) by mouth every 6 (six) hours as needed for severe pain (pain score 7-10). 30 tablet 0  . LORazepam (ATIVAN) 0.5 MG tablet Take 1 tab 30 minutes before PET and 2 tabs before MRI (Patient taking differently: Take 0.5-1 mg by mouth See admin instructions. Take 0.5 mg (1 tablet) by mouth 30 minutes before PET scans and 1 mg (2 tablets) before MRI(s)) 30 tablet 0  . metoprolol tartrate (LOPRESSOR) 25 MG tablet Take 1 tablet (25 mg total) by mouth 2 (two) times daily. 60 tablet 1  . ondansetron (ZOFRAN-ODT) 4 MG disintegrating tablet Dissolve 1 tablet (4 mg total) by mouth every 8 (eight) hours as needed for nausea or vomiting. 20 tablet 0  . pantoprazole (PROTONIX) 40 MG tablet Take 1 tablet (40 mg total) by mouth daily. 30 tablet 2  . potassium chloride SA (KLOR-CON M) 20 MEQ  tablet Take 1 tablet (20 mEq total) by mouth 2 (two) times daily for 5 days. 10 tablet 0  . pregabalin (LYRICA) 75 MG capsule Take 1 capsule (75 mg total) by mouth every morning.    . rosuvastatin (CRESTOR) 10 MG tablet Take 10 mg by mouth at bedtime.    . senna-docusate (SENOKOT-S) 8.6-50 MG tablet Take 1 tablet by mouth 2 (two) times daily.    . vemurafenib (ZELBORAF) 240 MG tablet Take 4 tablets (960 mg total) by mouth every 12 (twelve) hours. Take with water. 240 tablet 0   No current facility-administered medications for this visit.    VITAL SIGNS: There were no vitals taken  for this visit. There were no vitals filed for this visit.  Estimated body mass index is 24.78 kg/m as calculated from the following:   Height as of 01/03/23: 5\' 3"  (1.6 m).   Weight as of 01/07/23: 139 lb 14.4 oz (63.5 kg).   PERFORMANCE STATUS (ECOG) : 1 - Symptomatic but completely ambulatory   Physical Exam General: NAD Cardiovascular: regular rate and rhythm Pulmonary: clear ant fields Abdomen: soft, nontender, + bowel sounds Extremities: no edema, no joint deformities Skin: no rashes Neurological: AAO x3  IMPRESSION: History of Present Illness Laura [name] presents to clinic today for symptom management follow-up. No acute distress noted. Her husband is present. Taking things one day at a time. She endorses ongoing fatigue. Recent hospitalization due to encephalopathy and dehydration. Denies nausea, vomiting, constipation, or diarrhea. The patient also reports issues with her prescribed oxygen therapy, stating she has not received any communication from the home health care provider regarding delivery. She expresses concern about the cost of the oxygen therapy and whether it is covered by her insurance. Advised we will follow-up and provide updates.   In addition, the patient reports a lack of appetite, stating she has not been eating much. She has been taking dronabinol twice daily to stimulate appetite but reports it has not been effective. She also reports feeling tired, but it is unclear whether this is related to her overall health status and recent hospitalization. Patient recommended to increase marinol to 5mg  twice daily.  Instructed patient and husband if noticeable changes in mentation after increasing medication to decrease back down to 2.5mg .   Laura [name] complains of increased lower back pain described as "soreness" and constant "throb". She reports not taking her breakthrough pain medication as previously instructed, which may have contributed to the exacerbation of her  pain. The patient rates her current pain level as an 8 out of 10. Unfortunately, there was some confusion at discharge as to how to take medication and she was not aware that she could continue with breakthrough medication.  Education provided on use. Given level of significant discomfort patient to receive dose of medication during her infusion today for comfort. She is tolerating MS Contin 15mg  every 12 hours. Denies drowsiness or mentation changes. We will continue to closely monitor and assess renal function. Pregabalin 75mg  three times daily.   Lastly, the patient reports no issues with constipation or diarrhea in the past couple of days.  I discussed the importance of continued conversation with family and their medical providers regarding overall plan of care and treatment options, ensuring decisions are within the context of the patients values and GOCs.  PLAN:  Cancer Related Pain Severe lower back pain rated as 8/10. Patient has been taking extended-release morphine 15mg  but not utilizing breakthrough medication as needed. -Encouraged to use breakthrough  medication every 6 hours as needed. -Administer 0.5mg  IV Dilaudid every 1 hour for 2 doses during infusion today. -Send refill prescription for breakthrough medication to [facility].  Decreased Appetite Patient reports no appetite despite taking dronabinol twice daily. -Increase dronabinol to two tablets twice daily starting tomorrow. Monitor for any changes in drowsiness or confusion.  Oxygen Therapy Patient has not received oxygen delivery as ordered. Insurance coverage and payment issues noted. -Staff to follow up with [employer] regarding delivery and insurance coverage.  Follow-up in 3 weeks. Call if any issues arise before then.  Patient expressed understanding and was in agreement with this plan. She also understands that She can call the clinic at any time with any questions, concerns, or complaints.   Any  controlled substances utilized were prescribed in the context of palliative care. PDMP has been reviewed.    Visit consisted of counseling and education dealing with the complex and emotionally intense issues of symptom management and palliative care in the setting of serious and potentially life-threatening illness.  Willette Alma, AGPCNP-BC  Palliative Medicine Team/ Cancer Center

## 2023-01-08 NOTE — Telephone Encounter (Signed)
Consult to hospitalist.  Report of bilateral edema.  Edema seem to be worse last night improved this morning.  Recommend raising her leg when sitting down.  She has not had much oral intake but has some mild improvement in liquid intake.  Discussed with give Lasix to prevent dehydration.  She may also use ibuprofen 600 mg twice daily as we discussed yesterday.  Reports some confusion.  He has arrange for shipment of cobimetinib and vemurafenib.  Hoping to get them w/in 48 hours.  We discussed that start medication as soon as she gets it.  If she has no response in the next week, prognosis will be poor.  We also discussed goals of care, DNR, comfort measure in the setting of not responding to treatment and continued progression.

## 2023-01-08 NOTE — Telephone Encounter (Addendum)
TC from [place], RN Case Manager w/ [employer], stating that pt and husband reported that Dr. Cherly Hensen was going to refill pt's Flexeril, Protonix, and Marinol yesterday, but husband went to the pharmacy and nothing was there. Upon review, Dr. Cherly Hensen ordered these meds yesterday. TC to [facility] [employer] [facility] [employer] and spoke w/ Efraim Kaufmann, who reports that Flexeril was filled and picked up yesterday, but the Marinol and Protonix are not available due to it being too early for refill (both picked up on 11/20). TC to inform pt's husband, and he states that pt has enough Protonix to last through the weekend, but there is only one day left on her Marinol d/t increased dose. Informed husband that Dr. Cherly Hensen would be notified of this. Advised to call office early Monday morning if this doesn't get refilled over the weekend. He verbalizes understanding.

## 2023-01-08 NOTE — Telephone Encounter (Signed)
Oral Chemotherapy Pharmacist Encounter  I spoke with patients husband for the overview of: Zelboraf (vemurafenib) and Cotellic (cobimetinib) for the treatment of stage IV melanoma, BRAF [id number] mutated, planned duration until disease progression or unacceptable toxicity.  Counseled patient on administration, dosing, side effects, monitoring, drug-food interactions, safe handling, storage, and disposal.  Patient will take Zelboraf 240mg  tablets, 4 tablets (960 mg) by mouth twice daily with or without food and Cotellic 20mg  tablets, 3 tablets (60mg ) by mouth daily with or iwhtout food for 21 days on, then off for 7 days. Repeat every 28 days.   Zelboraf start date: pending shipment from [facility]  Adverse effects for Zelboraf and Cotellic include but are not limited to: decreased blood counts, muscle pain, fatigue, skin rash, skin photosensitivity, skin papilloma, alopecia, hepatotoxicity, diarrhea, nausea/vomiting, increase in serum creatinine, and Qtc prolongation.  Baseline EKG obtained on 01/03/2023 with Qtc of 429 msec  Reviewed with patient importance of keeping a medication schedule and plan for any missed doses. No barriers to medication adherence identified.  Medication reconciliation performed and medication/allergy list updated.  Insurance authorization for Zelboraf has been obtained. Patient fills through [facility]. Patient informed the pharmacy will reach out 5-7 days prior to needing next fill of Zelboraf to coordinate continued medication acquisition to prevent break in therapy.  All questions answered. Patients husband voiced understanding and appreciation.  Medication education handout placed in mail for patient. Patient knows to call the office with questions or concerns. Oral Chemotherapy Clinic phone number provided to patient.   [name], PharmD Hematology/Oncology Clinical Pharmacist [facility] Oral Chemotherapy Navigation  Clinic [phone] 01/08/2023   11:03 AM

## 2023-01-11 ENCOUNTER — Inpatient Hospital Stay (HOSPITAL_BASED_OUTPATIENT_CLINIC_OR_DEPARTMENT_OTHER): Payer: BC Managed Care – PPO

## 2023-01-11 ENCOUNTER — Other Ambulatory Visit: Payer: Self-pay | Admitting: *Deleted

## 2023-01-11 ENCOUNTER — Other Ambulatory Visit (HOSPITAL_COMMUNITY): Payer: Self-pay

## 2023-01-11 ENCOUNTER — Inpatient Hospital Stay: Payer: BC Managed Care – PPO

## 2023-01-11 ENCOUNTER — Other Ambulatory Visit: Payer: Self-pay

## 2023-01-11 ENCOUNTER — Telehealth: Payer: Self-pay | Admitting: *Deleted

## 2023-01-11 ENCOUNTER — Encounter: Payer: Self-pay | Admitting: Nurse Practitioner

## 2023-01-11 ENCOUNTER — Inpatient Hospital Stay (HOSPITAL_BASED_OUTPATIENT_CLINIC_OR_DEPARTMENT_OTHER): Payer: BC Managed Care – PPO | Admitting: Nurse Practitioner

## 2023-01-11 VITALS — BP 121/73 | HR 102 | Temp 96.3°F | Resp 18 | Wt 136.5 lb

## 2023-01-11 DIAGNOSIS — C7951 Secondary malignant neoplasm of bone: Secondary | ICD-10-CM

## 2023-01-11 DIAGNOSIS — G893 Neoplasm related pain (acute) (chronic): Secondary | ICD-10-CM

## 2023-01-11 DIAGNOSIS — R638 Other symptoms and signs concerning food and fluid intake: Secondary | ICD-10-CM | POA: Diagnosis not present

## 2023-01-11 DIAGNOSIS — R112 Nausea with vomiting, unspecified: Secondary | ICD-10-CM | POA: Insufficient documentation

## 2023-01-11 DIAGNOSIS — C4359 Malignant melanoma of other part of trunk: Secondary | ICD-10-CM | POA: Diagnosis not present

## 2023-01-11 DIAGNOSIS — R630 Anorexia: Secondary | ICD-10-CM | POA: Diagnosis not present

## 2023-01-11 DIAGNOSIS — C787 Secondary malignant neoplasm of liver and intrahepatic bile duct: Secondary | ICD-10-CM | POA: Diagnosis not present

## 2023-01-11 DIAGNOSIS — C7931 Secondary malignant neoplasm of brain: Secondary | ICD-10-CM | POA: Diagnosis not present

## 2023-01-11 DIAGNOSIS — Z7189 Other specified counseling: Secondary | ICD-10-CM | POA: Diagnosis not present

## 2023-01-11 DIAGNOSIS — Z515 Encounter for palliative care: Secondary | ICD-10-CM

## 2023-01-11 DIAGNOSIS — Z79620 Long term (current) use of immunosuppressive biologic: Secondary | ICD-10-CM | POA: Diagnosis not present

## 2023-01-11 DIAGNOSIS — R110 Nausea: Secondary | ICD-10-CM

## 2023-01-11 DIAGNOSIS — Z5112 Encounter for antineoplastic immunotherapy: Secondary | ICD-10-CM | POA: Diagnosis not present

## 2023-01-11 DIAGNOSIS — E860 Dehydration: Secondary | ICD-10-CM

## 2023-01-11 DIAGNOSIS — C7801 Secondary malignant neoplasm of right lung: Secondary | ICD-10-CM | POA: Diagnosis not present

## 2023-01-11 DIAGNOSIS — C7802 Secondary malignant neoplasm of left lung: Secondary | ICD-10-CM | POA: Diagnosis not present

## 2023-01-11 DIAGNOSIS — C771 Secondary and unspecified malignant neoplasm of intrathoracic lymph nodes: Secondary | ICD-10-CM | POA: Diagnosis not present

## 2023-01-11 DIAGNOSIS — R530 Neoplastic (malignant) related fatigue: Secondary | ICD-10-CM

## 2023-01-11 LAB — LACTATE DEHYDROGENASE: LDH: 2188 U/L — ABNORMAL HIGH (ref 98–192)

## 2023-01-11 LAB — CBC WITH DIFFERENTIAL (CANCER CENTER ONLY)
Abs Immature Granulocytes: 0.82 10*3/uL — ABNORMAL HIGH (ref 0.00–0.07)
Basophils Absolute: 0.1 10*3/uL (ref 0.0–0.1)
Basophils Relative: 1 %
Eosinophils Absolute: 0.1 10*3/uL (ref 0.0–0.5)
Eosinophils Relative: 1 %
HCT: 31.8 % — ABNORMAL LOW (ref 36.0–46.0)
Hemoglobin: 10.6 g/dL — ABNORMAL LOW (ref 12.0–15.0)
Immature Granulocytes: 9 %
Lymphocytes Relative: 11 %
Lymphs Abs: 1 10*3/uL (ref 0.7–4.0)
MCH: 30.9 pg (ref 26.0–34.0)
MCHC: 33.3 g/dL (ref 30.0–36.0)
MCV: 92.7 fL (ref 80.0–100.0)
Monocytes Absolute: 0.5 10*3/uL (ref 0.1–1.0)
Monocytes Relative: 5 %
Neutro Abs: 6.4 10*3/uL (ref 1.7–7.7)
Neutrophils Relative %: 73 %
Platelet Count: 340 10*3/uL (ref 150–400)
RBC: 3.43 MIL/uL — ABNORMAL LOW (ref 3.87–5.11)
RDW: 15.2 % (ref 11.5–15.5)
WBC Count: 8.9 10*3/uL (ref 4.0–10.5)
nRBC: 0.3 % — ABNORMAL HIGH (ref 0.0–0.2)

## 2023-01-11 LAB — CMP (CANCER CENTER ONLY)
ALT: 88 U/L — ABNORMAL HIGH (ref 0–44)
AST: 192 U/L (ref 15–41)
Albumin: 2.7 g/dL — ABNORMAL LOW (ref 3.5–5.0)
Alkaline Phosphatase: 784 U/L — ABNORMAL HIGH (ref 38–126)
Anion gap: 5 (ref 5–15)
BUN: 23 mg/dL — ABNORMAL HIGH (ref 6–20)
CO2: 30 mmol/L (ref 22–32)
Calcium: 9 mg/dL (ref 8.9–10.3)
Chloride: 97 mmol/L — ABNORMAL LOW (ref 98–111)
Creatinine: 0.81 mg/dL (ref 0.44–1.00)
GFR, Estimated: >60 mL/min (ref >60)
Glucose, Bld: 105 mg/dL — ABNORMAL HIGH (ref 70–99)
Potassium: 3.4 mmol/L — ABNORMAL LOW (ref 3.5–5.1)
Sodium: 132 mmol/L — ABNORMAL LOW (ref 135–145)
Total Bilirubin: 2 mg/dL — ABNORMAL HIGH (ref <1.2)
Total Protein: 5.5 g/dL — ABNORMAL LOW (ref 6.5–8.1)

## 2023-01-11 MED ORDER — DRONABINOL 5 MG PO CAPS: 5.0000 mg | ORAL_CAPSULE | ORAL | 0 refills | Status: DC

## 2023-01-11 MED ORDER — DRONABINOL 5 MG PO CAPS
5.0000 mg | ORAL_CAPSULE | Freq: Two times a day (BID) | ORAL | 0 refills | Status: DC
  Filled 2023-01-11: qty 60, 30d supply, fill #0

## 2023-01-11 MED ORDER — HYDROMORPHONE HCL 1 MG/ML IJ SOLN
0.5000 mg | Freq: Once | INTRAMUSCULAR | Status: AC
  Administered 2023-01-11: 0.5 mg via INTRAMUSCULAR
  Filled 2023-01-11: qty 1

## 2023-01-11 MED ORDER — ONDANSETRON HCL 4 MG/2ML IJ SOLN
8.0000 mg | Freq: Once | INTRAMUSCULAR | Status: AC
  Administered 2023-01-11: 8 mg via INTRAVENOUS
  Filled 2023-01-11: qty 4

## 2023-01-11 MED ORDER — DRONABINOL 5 MG PO CAPS: 5.0000 mg | ORAL_CAPSULE | Freq: Two times a day (BID) | ORAL | 0 refills | Status: DC

## 2023-01-11 MED ORDER — DEXAMETHASONE SODIUM PHOSPHATE 10 MG/ML IJ SOLN
10.0000 mg | Freq: Once | INTRAMUSCULAR | Status: AC
  Administered 2023-01-11: 10 mg via INTRAVENOUS
  Filled 2023-01-11: qty 1

## 2023-01-11 MED ORDER — PANTOPRAZOLE SODIUM 40 MG IV SOLR: 40.0000 mg | Freq: Once | INTRAVENOUS | Status: DC

## 2023-01-11 MED ORDER — FAMOTIDINE IN NACL 20-0.9 MG/50ML-% IV SOLN
20.0000 mg | Freq: Once | INTRAVENOUS | Status: AC
Start: 2023-01-11 — End: 2023-01-11
  Administered 2023-01-11: 20 mg via INTRAVENOUS
  Filled 2023-01-11: qty 50

## 2023-01-11 MED ORDER — SODIUM CHLORIDE 0.9 % IV SOLN: INTRAVENOUS | Status: AC

## 2023-01-11 MED ORDER — ONDANSETRON HCL 4 MG/2ML IJ SOLN: 8.0000 mg | Freq: Once | INTRAMUSCULAR | Status: DC

## 2023-01-11 MED ORDER — SODIUM CHLORIDE 0.9 % IV SOLN: 10.0000 mg | Freq: Once | INTRAVENOUS | Status: DC

## 2023-01-11 NOTE — Progress Notes (Signed)
Nutrition Assessment:  Patient identified via Malnutrition Screening report for weight loss, poor appetite.   57 year old female with malignant melanoma metastatic to brain, liver, bone.  Patient previously on immunotherapy but recent hospitalization.  Treatment changed to vermurafenib and cobimetinib.    Met with patient and husband during infusion of fluids only, pepcid and zofran today.  No appetite, not eating.  No solid food in the past 2 days.  Drinking some liquids (water 20 oz).  Having nausea before time to take another nausea pill.   No diarrhea   Medications: marinol added today  Labs: reviewed  Anthropometrics:   Height: 63 inches Weight: 136 lb 8 oz 12/07/22 134 lb 11/18/22 138 lb BMI: 24  1% weight loss in the last 2 months  Estimated Energy Needs  Kcals: 1550-1800 Protein: 74-93 g Fluid: 1550-1823ml  NUTRITION DIAGNOSIS: Inadequate oral intake related to cancer and related treatment side effects as evidenced by 1% weight loss and extremely poor po intake (eating less than 50% of estimated energy needs for > 5 days)   INTERVENTION:  Discussed foods to choose with nausea. Handout provided Encouraged ensure clear shakes or similar with nausea.  Patient has tried in the past.   Encouraged small frequent bites/sips Contact information provided    MONITORING, EVALUATION, GOAL: weight trends, intake   NEXT VISIT: to be determined  Kemiah Booz B. Freida Busman, RD, LDN Registered Dietitian [phone]

## 2023-01-11 NOTE — Assessment & Plan Note (Signed)
Continue vemurafenib 960 mg twice daily, and cobimetinib 60 mg once daily. Her insurance allows this rather than other combo Monitor for new systemic symptoms LFTs later this week and then every 2 weeks  EKG on 12/30 and then montly x3 months  Monitor for diarrhea, nausea, rash, MSK pain, visual changes

## 2023-01-11 NOTE — Patient Instructions (Signed)

## 2023-01-11 NOTE — Progress Notes (Signed)
Spoke to husband. She has not been eating and drinking much. She started Zelboraf (vemurafenib) and Cotellic (cobimetinib) on Saturday.  Will schedule for IVF on Tue-Fri this week either at infusion or symptom clinic.

## 2023-01-11 NOTE — Assessment & Plan Note (Signed)
Will use dilaudid as needed Continue follow-up with palliative care

## 2023-01-11 NOTE — Telephone Encounter (Signed)
Connected with Laura [name] in infusion treatment area.  Treatment nurse requested fax number for FMLA forms to be sent.  Reviewed [facility] forms policy, Collected signed [name] and [name].  Awaiting Laura [name] for spouse Laura [name].  Request continuous leave of absence beginning January 01, 2023.

## 2023-01-11 NOTE — Assessment & Plan Note (Signed)
IV zofran in infusion today Continue Zofran sublingual as needed 1 dose of IV Pepcid in the infusion room.

## 2023-01-11 NOTE — Assessment & Plan Note (Addendum)
Prescribed Marinol 5 mg twice daily.  Can go up to 10 mg twice daily if needed Prescription sent to [facility] at [facility] for daily IV fluid in the infusion room this week and next week as needed.

## 2023-01-11 NOTE — Progress Notes (Signed)
Patient Care Team: [name], [name] as PCP - General (Family Medicine) Pickenpack-Cousar, Arty Baumgartner, NP as Nurse Practitioner [facility] and Palliative Medicine)  Clinic Day:  01/11/2023  Referring physician: [name], [name]  ASSESSMENT & PLAN:   Assessment & Plan: Diagnosis: 11/25/2022. Diffuse liver, bone, lung metastases. Brain metastases after starting systemic treatment Treatment: 12/11/2022. First line ipi/nivo. Completed 2 cycles.   Due to clinically without improvement, new abdominal pain, loss of appetite, will changed to BRAF/MAC inhibitors for disease control. Discussed potential side effects. Her insurance only authorized vemurafenib and cobimetinib so she started on 12/14.  LDH improved today. Repeat CMP and LDH on 12/18.  Malignant melanoma metastatic to bone (HCC) Continue vemurafenib 960 mg twice daily, and cobimetinib 60 mg once daily. Her insurance allows this rather than other combo Monitor for new systemic symptoms LFTs later this week and then every 2 weeks  EKG on 12/30 and then montly x3 months  Monitor for diarrhea, nausea, rash, MSK pain, visual changes  Cancer associated pain Will use dilaudid as needed Continue follow-up with palliative care  Nausea without vomiting IV zofran in infusion today Continue Zofran sublingual as needed 1 dose of IV Pepcid in the infusion room.  Decreased oral intake Prescribed Marinol 5 mg twice daily.  Can go up to 10 mg twice daily if needed Prescription sent to [facility] pharmacy [facility] for daily IV fluid in the infusion room this week and next week as needed.   See me 12/19 The patient understands the plans discussed today and is in agreement with them.  She knows to contact our office if she develops concerns prior to her next appointment.  Melven Sartorius, MD  [facility] - A DEPT OF [facility] [address] [place] [address] Dept: [phone] Dept Fax: [phone]   No orders of the defined types were placed in this encounter.     CHIEF COMPLAINT:  CC: stage IV melanoma  Current Treatment:  vemurafenib/cobimetinib  INTERVAL HISTORY:  Laura [name] is here today for repeat clinical assessment. She denies fevers or chills. She reports epigastric and lower back pain. LBP is the same.  She takes dilaudid about 3 times a day. She takes ibuprofen only one a day. No aspirin. No much oral intake or fluid. She is AO x 3.  Husband reports more coherent for 3 to 4 days.  I have reviewed the past medical history, past surgical history, social history and family history with the patient and they are unchanged from previous note.  ALLERGIES:  is allergic to aspirin.  MEDICATIONS:  Current Outpatient Medications  Medication Sig Dispense Refill   AMBIEN CR 6.25 MG CR tablet Take 6.25 mg by mouth at bedtime.     busPIRone (BUSPAR) 15 MG tablet Take 15 mg by mouth 2 (two) times daily.     cobimetinib fumarate (COTELLIC) 20 MG tablet Take 3 tablets (60 mg total) by mouth daily. Take on days 1-21. Repeat every 28 days. Avoid sun exposure. 63 tablet 0   cyclobenzaprine (FLEXERIL) 5 MG tablet Take 1 tablet (5 mg total) by mouth 3 (three) times daily as needed for muscle spasms. Don't take with dilaudid 30 tablet 0   dronabinol (MARINOL) 5 MG capsule Take 1 capsule (5 mg total) by mouth 2 (two) times daily. 60 capsule 0   famotidine-calcium carbonate-magnesium hydroxide (PEPCID COMPLETE) 10-800-165 MG chewable tablet Chew 1 tablet  by mouth in the morning.     HYDROmorphone (DILAUDID) 2 MG tablet Take 1 tablet (2 mg total) by mouth every 6 (six) hours as needed for severe pain (pain score 7-10). 30 tablet 0   LORazepam (ATIVAN) 0.5 MG tablet Take 1 tab 30 minutes before PET and 2 tabs before MRI (Patient taking differently: Take 0.5-1 mg by mouth See admin instructions. Take 0.5 mg (1 tablet) by mouth 30  minutes before PET scans and 1 mg (2 tablets) before MRI(s)) 30 tablet 0   metoprolol tartrate (LOPRESSOR) 25 MG tablet Take 1 tablet (25 mg total) by mouth 2 (two) times daily. 60 tablet 1   ondansetron (ZOFRAN-ODT) 4 MG disintegrating tablet Dissolve 1 tablet (4 mg total) by mouth every 8 (eight) hours as needed for nausea or vomiting. 20 tablet 0   pantoprazole (PROTONIX) 40 MG tablet Take 1 tablet (40 mg total) by mouth daily. 30 tablet 2   potassium chloride SA (KLOR-CON M) 20 MEQ tablet Take 1 tablet (20 mEq total) by mouth 2 (two) times daily for 5 days. 10 tablet 0   pregabalin (LYRICA) 75 MG capsule Take 1 capsule (75 mg total) by mouth every morning.     rosuvastatin (CRESTOR) 10 MG tablet Take 10 mg by mouth at bedtime.     senna-docusate (SENOKOT-S) 8.6-50 MG tablet Take 1 tablet by mouth 2 (two) times daily.     vemurafenib (ZELBORAF) 240 MG tablet Take 4 tablets (960 mg total) by mouth every 12 (twelve) hours. Take with water. 240 tablet 0   No current facility-administered medications for this visit.   Facility-Administered Medications Ordered in Other Visits  Medication Dose Route Frequency Provider Last Rate Last Admin   ondansetron (ZOFRAN) injection 8 mg  8 mg Intravenous Once Melven Sartorius, MD        HISTORY OF PRESENT ILLNESS:   Oncology History  Metastatic malignant melanoma (HCC)  11/16/2022 Imaging   She presented to the emergency room on 11/16/2022 for back pain x 2 weeks and central chest pain for several months   CTA CAP IMPRESSION: 1. Negative for aortic dissection or aneurysm. 2. 1.2 cm left upper lobe nodule.  For 3. Multiple ill-defined liver lesions, suspicious for metastatic disease. 4. Multiple lytic osseous lesions, suspicious for metastatic disease. Pathologic fracture of the superior endplate of L5   11/25/2022 Pathology Results   1. Liver, needle/core biopsy, Left hepatic lobe lesion :       - METASTATIC MELANOMA.    12/04/2022 Initial  Diagnosis   Melanoma of skin (HCC) Liver, bone metastases. 1.2 cm LUL nodule   12/09/2022 PET scan   PET: Diffuse bone and liver metastases.  Lung metastases.    12/11/2022 -  Chemotherapy   Patient is on Treatment Plan : MELANOMA Nivolumab (1) + Ipilimumab (3) q21d / Nivolumab (480) q28d     12/17/2022 Imaging   MRI brain 1. Three small right hemisphere brain metastases metastases ranging from 2 mm to 4 mm. Minimal hemosiderin. No associated cerebral edema or mass effect. No other metastatic disease identified in the brain.   2. Known osseous metastatic disease but no destructive lesion identified in the skull or visible cervical spine.   3. Moderate for age cerebral white matter changes most commonly due to small vessel disease.       REVIEW OF SYSTEMS:   All relevant systems were reviewed with the patient and are negative.   VITALS:  Blood pressure 121/73, pulse (!) 102,  temperature (!) 96.3 F (35.7 C), temperature source Temporal, resp. rate 18, weight 136 lb 8 oz (61.9 kg), SpO2 96%.  Wt Readings from Last 3 Encounters:  01/11/23 136 lb 8 oz (61.9 kg)  01/07/23 139 lb 14.4 oz (63.5 kg)  01/05/23 131 lb 13.4 oz (59.8 kg)    Body mass index is 24.18 kg/m.  Performance status (ECOG): 3 - Symptomatic, >50% confined to bed  PHYSICAL EXAM:   GENERAL:alert, no distress but not comfortable SKIN: skin color pale, no rashes  EYES: normal, sclera clear LUNGS: clear to auscultation with normal breathing effort.  No wheeze or rales HEART: tachycardia ABDOMEN: abdomen soft, non-tender and nondistended Musculoskeletal: non pitting bilateral lower extremity edema NEURO: AO x 3  LABORATORY DATA:  I have reviewed the data as listed    Component Value Date/Time   NA 132 (L) 01/11/2023 1222   K 3.4 (L) 01/11/2023 1222   CL 97 (L) 01/11/2023 1222   CO2 30 01/11/2023 1222   GLUCOSE 105 (H) 01/11/2023 1222   BUN 23 (H) 01/11/2023 1222   CREATININE 0.81 01/11/2023 1222    CALCIUM 9.0 01/11/2023 1222   PROT 5.5 (L) 01/11/2023 1222   ALBUMIN 2.7 (L) 01/11/2023 1222   AST 192 (HH) 01/11/2023 1222   ALT 88 (H) 01/11/2023 1222   ALKPHOS 784 (H) 01/11/2023 1222   BILITOT 2.0 (H) 01/11/2023 1222   GFRNONAA >60 01/11/2023 1222   GFRAA >60 04/20/2018 0823    No results found for: "SPEP", "UPEP"  Lab Results  Component Value Date   WBC 8.9 01/11/2023   NEUTROABS 6.4 01/11/2023   HGB 10.6 (L) 01/11/2023   HCT 31.8 (L) 01/11/2023   MCV 92.7 01/11/2023   PLT 340 01/11/2023      Chemistry      Component Value Date/Time   NA 132 (L) 01/11/2023 1222   K 3.4 (L) 01/11/2023 1222   CL 97 (L) 01/11/2023 1222   CO2 30 01/11/2023 1222   BUN 23 (H) 01/11/2023 1222   CREATININE 0.81 01/11/2023 1222      Component Value Date/Time   CALCIUM 9.0 01/11/2023 1222   ALKPHOS 784 (H) 01/11/2023 1222   AST 192 (HH) 01/11/2023 1222   ALT 88 (H) 01/11/2023 1222   BILITOT 2.0 (H) 01/11/2023 1222       RADIOGRAPHIC STUDIES: I have personally reviewed the radiological images as listed and agreed with the findings in the report. DG Chest 2 View Result Date: 01/03/2023 CLINICAL DATA:  Sepsis EXAM: CHEST - 2 VIEW COMPARISON:  12/27/2022 FINDINGS: The heart size and mediastinal contours are within normal limits. Trace pleural effusions. Unchanged left upper lobe lung nodule. The visualized skeletal structures are unremarkable. IMPRESSION: 1. Trace pleural effusions. No focal airspace opacity. 2.  Unchanged left upper lobe lung nodule. Electronically Signed   By: [name]   On: 01/03/2023 15:59   CT Angio Chest PE W and/or Wo Contrast Result Date: 12/28/2022 CLINICAL DATA:  Patient with metastatic melanoma, new transaminitis, pulmonary embolism suspected. EXAM: CT ANGIOGRAPHY CHEST CT ABDOMEN AND PELVIS WITH CONTRAST TECHNIQUE: Multidetector CT imaging of the chest was performed using the standard protocol during bolus administration of intravenous contrast.  Multiplanar CT image reconstructions and MIPs were obtained to evaluate the vascular anatomy. Multidetector CT imaging of the abdomen and pelvis was performed using the standard protocol during bolus administration of intravenous contrast. RADIATION DOSE REDUCTION: This exam was performed according to the departmental dose-optimization program which includes automated  exposure control, adjustment of the mA and/or kV according to patient size and/or use of iterative reconstruction technique. CONTRAST:  OMNIPAQUE IOHEXOL 350 MG/ML SOLN COMPARISON:  PET-CT recently 12/09/2022, and CTA chest, abdomen and pelvis 11/16/2022. FINDINGS: CTA CHEST FINDINGS Cardiovascular: The cardiac size is normal. No arterial dilatation or embolism is seen. Minimal pericardial effusion anteriorly. Scattered calcific plaques lad coronary artery. Mild aortic atherosclerosis. There is no aneurysm, stenosis or dissection. The great vessels are widely patent. Pulmonary veins are nondistended. Mediastinum/Nodes: Enlarged superior left hilar lymph node is 1.3 cm in short axis, previously 1.1 cm. No other intrathoracic adenopathy is seen. 1.2 cm hypodense nodule in the inferior pole of the left lobe of the thyroid gland is unchanged. No follow-up imaging is recommended. Lungs/Pleura: 1.5 cm pulmonary metastasis left upper lobe, level of the aortic arch, unchanged from the PET-CT, 11/16/2022 was 1.2 cm. There are several new nodules in the right middle lobe, most are 3 mm or smaller for example on series 13 axial images 80 6-99. Largest of these is 5 mm anteriorly on 13:90, concerning for new metastases. In the right lower lobe there is a new 5 mm nodule about the fissure on 13:90. Mild paraseptal emphysema again seen in the lung apices. There are coarse atelectatic bands in the posterior bases. On the left there is increased conspicuity of fissural nodules in the lower lung field, largest of these is about 3 mm. There is a new nodule in the  left lower lobe measuring 5 mm on 13:99 and a new 3 mm apical left lung nodule on 13:44. There is no pleural effusion, no other new lung abnormality. No focal consolidation. Musculoskeletal: There is chronic interbody fusion hardware in the lower cervical spine. Multifocal lytic metastatic disease particularly in the thoracic spine but also in the ribs and sternum, with pathologic compression fracture of the T3 vertebral body, worsening lytic metastasis T4 body with extension into the spinal canal and increased erosion through the posterior cortex, with increased soft tissue infiltration into the left T3-4 foramen. Similar increased posterior cortical erosion and metastatic extension into the spinal canal at T8. Mass effect on the cord could be present at these levels. Review of the MIP images confirms the above findings. CT ABDOMEN and PELVIS FINDINGS Hepatobiliary: Innumerable diffuse metastases to the liver largest is in segment 2, today 7.5 cm, on the PET-CT 7.4 cm, the next largest is medially in segment 5 measuring 3.9 cm, previously 3.3 cm. Gallbladder is absent without bile duct dilatation. Pancreas: No abnormality. Spleen: New scattered nonspecific subcentimeter hypodensities are noted in the inferior aspect of the spleen, possible metastases. Largest is 8 mm on 19:24. Adrenals/Urinary Tract: Stable 1 cm left adrenal nodule, was not hypermetabolic on PET-CT. No right adrenal or broad or renal mass. No urinary stone or obstruction. Unremarkable bladder for the degree of distention. Stomach/Bowel: Unremarkable contracted stomach. Normal caliber unopacified small bowel. Normal appendix. Moderate fecal stasis ascending and transverse colon. Distal descending colon and sigmoid either demonstrating mild wall thickening or underdistention. Unremarkable rectum. Vascular/Lymphatic: Aortic atherosclerosis. No enlarged abdominal or pelvic lymph nodes. Reproductive: Status post hysterectomy. No adnexal masses. Multiple  pelvic phleboliths. Other: No ascites or free air, no incarcerated hernia. Musculoskeletal: Progressive lytic metastatic disease, including compared with 12/09/2022, is noted at multiple levels of the lumbar spine, throughout the sacrum and bony pelvis, and with multiple small lytic lesions in both proximal femurs. There is increased pathologic compression fracture of the L5 vertebral body with soft tissue  herniation into the spinal canal severely narrowing the spinal canal from the L4-5 to the L5-S1 level. There is an enhancing mass in the right dorsal paraspinous musculature at L2-3 measuring 2.3 cm and was seen on 12/09/2022. Destructive mass of S3 to the left today measures 3 cm, previously 2.6 cm. Review of the MIP images confirms the above findings. IMPRESSION: 1. No evidence of arterial dilatation or embolism. 2. 1.5 cm pulmonary metastasis left upper lobe, unchanged from the PET-CT, 1.2 cm on 11/16/2022. 3. Multiple new nodules in the right middle lobe, right lower lobe, and left apical lung, largest 5 mm, concerning for new metastases. 4. Increased conspicuity of fissural nodules in the left lower lung field. 5. Increased size of a left hilar lymph node. 6. Innumerable diffuse metastases to the liver, largest 7.5 cm in segment 2, previously 7.4 cm. 7. New scattered subcentimeter hypodensities in the inferior spleen, possible metastases. 8. Progressive lytic metastatic disease throughout the spine, sacrum, bony pelvis, and proximal femurs. 9. Increased pathologic compression fracture of the T3 and L5 vertebral body with soft tissue herniation into the spinal canal severely narrowing the spinal canal from the L4-5 to the L5-S1 level, and in the thoracic spine with increased soft tissue infiltration of the spinal canal at T4 and T8, and in the left T3-4 foramen. 10. Stable 2.3 cm enhancing mass in the right dorsal paraspinous musculature at L2-3. 11. Constipation. 12. Distal descending colon and sigmoid either  demonstrating mild wall thickening or underdistention. Correlate clinically for underlying colitis. 13. Aortic and coronary artery atherosclerosis. Aortic Atherosclerosis (ICD10-I70.0) and Emphysema (ICD10-J43.9). Electronically Signed   By: Almira Bar M.D.   On: 12/28/2022 02:39   CT ABDOMEN PELVIS W CONTRAST Result Date: 12/28/2022 CLINICAL DATA:  Patient with metastatic melanoma, new transaminitis, pulmonary embolism suspected. EXAM: CT ANGIOGRAPHY CHEST CT ABDOMEN AND PELVIS WITH CONTRAST TECHNIQUE: Multidetector CT imaging of the chest was performed using the standard protocol during bolus administration of intravenous contrast. Multiplanar CT image reconstructions and MIPs were obtained to evaluate the vascular anatomy. Multidetector CT imaging of the abdomen and pelvis was performed using the standard protocol during bolus administration of intravenous contrast. RADIATION DOSE REDUCTION: This exam was performed according to the departmental dose-optimization program which includes automated exposure control, adjustment of the mA and/or kV according to patient size and/or use of iterative reconstruction technique. CONTRAST:  OMNIPAQUE IOHEXOL 350 MG/ML SOLN COMPARISON:  PET-CT recently 12/09/2022, and CTA chest, abdomen and pelvis 11/16/2022. FINDINGS: CTA CHEST FINDINGS Cardiovascular: The cardiac size is normal. No arterial dilatation or embolism is seen. Minimal pericardial effusion anteriorly. Scattered calcific plaques lad coronary artery. Mild aortic atherosclerosis. There is no aneurysm, stenosis or dissection. The great vessels are widely patent. Pulmonary veins are nondistended. Mediastinum/Nodes: Enlarged superior left hilar lymph node is 1.3 cm in short axis, previously 1.1 cm. No other intrathoracic adenopathy is seen. 1.2 cm hypodense nodule in the inferior pole of the left lobe of the thyroid gland is unchanged. No follow-up imaging is recommended. Lungs/Pleura: 1.5 cm pulmonary  metastasis left upper lobe, level of the aortic arch, unchanged from the PET-CT, 11/16/2022 was 1.2 cm. There are several new nodules in the right middle lobe, most are 3 mm or smaller for example on series 13 axial images 80 6-99. Largest of these is 5 mm anteriorly on 13:90, concerning for new metastases. In the right lower lobe there is a new 5 mm nodule about the fissure on 13:90. Mild paraseptal emphysema again seen  in the lung apices. There are coarse atelectatic bands in the posterior bases. On the left there is increased conspicuity of fissural nodules in the lower lung field, largest of these is about 3 mm. There is a new nodule in the left lower lobe measuring 5 mm on 13:99 and a new 3 mm apical left lung nodule on 13:44. There is no pleural effusion, no other new lung abnormality. No focal consolidation. Musculoskeletal: There is chronic interbody fusion hardware in the lower cervical spine. Multifocal lytic metastatic disease particularly in the thoracic spine but also in the ribs and sternum, with pathologic compression fracture of the T3 vertebral body, worsening lytic metastasis T4 body with extension into the spinal canal and increased erosion through the posterior cortex, with increased soft tissue infiltration into the left T3-4 foramen. Similar increased posterior cortical erosion and metastatic extension into the spinal canal at T8. Mass effect on the cord could be present at these levels. Review of the MIP images confirms the above findings. CT ABDOMEN and PELVIS FINDINGS Hepatobiliary: Innumerable diffuse metastases to the liver largest is in segment 2, today 7.5 cm, on the PET-CT 7.4 cm, the next largest is medially in segment 5 measuring 3.9 cm, previously 3.3 cm. Gallbladder is absent without bile duct dilatation. Pancreas: No abnormality. Spleen: New scattered nonspecific subcentimeter hypodensities are noted in the inferior aspect of the spleen, possible metastases. Largest is 8 mm on  19:24. Adrenals/Urinary Tract: Stable 1 cm left adrenal nodule, was not hypermetabolic on PET-CT. No right adrenal or broad or renal mass. No urinary stone or obstruction. Unremarkable bladder for the degree of distention. Stomach/Bowel: Unremarkable contracted stomach. Normal caliber unopacified small bowel. Normal appendix. Moderate fecal stasis ascending and transverse colon. Distal descending colon and sigmoid either demonstrating mild wall thickening or underdistention. Unremarkable rectum. Vascular/Lymphatic: Aortic atherosclerosis. No enlarged abdominal or pelvic lymph nodes. Reproductive: Status post hysterectomy. No adnexal masses. Multiple pelvic phleboliths. Other: No ascites or free air, no incarcerated hernia. Musculoskeletal: Progressive lytic metastatic disease, including compared with 12/09/2022, is noted at multiple levels of the lumbar spine, throughout the sacrum and bony pelvis, and with multiple small lytic lesions in both proximal femurs. There is increased pathologic compression fracture of the L5 vertebral body with soft tissue herniation into the spinal canal severely narrowing the spinal canal from the L4-5 to the L5-S1 level. There is an enhancing mass in the right dorsal paraspinous musculature at L2-3 measuring 2.3 cm and was seen on 12/09/2022. Destructive mass of S3 to the left today measures 3 cm, previously 2.6 cm. Review of the MIP images confirms the above findings. IMPRESSION: 1. No evidence of arterial dilatation or embolism. 2. 1.5 cm pulmonary metastasis left upper lobe, unchanged from the PET-CT, 1.2 cm on 11/16/2022. 3. Multiple new nodules in the right middle lobe, right lower lobe, and left apical lung, largest 5 mm, concerning for new metastases. 4. Increased conspicuity of fissural nodules in the left lower lung field. 5. Increased size of a left hilar lymph node. 6. Innumerable diffuse metastases to the liver, largest 7.5 cm in segment 2, previously 7.4 cm. 7. New  scattered subcentimeter hypodensities in the inferior spleen, possible metastases. 8. Progressive lytic metastatic disease throughout the spine, sacrum, bony pelvis, and proximal femurs. 9. Increased pathologic compression fracture of the T3 and L5 vertebral body with soft tissue herniation into the spinal canal severely narrowing the spinal canal from the L4-5 to the L5-S1 level, and in the thoracic spine with increased soft  tissue infiltration of the spinal canal at T4 and T8, and in the left T3-4 foramen. 10. Stable 2.3 cm enhancing mass in the right dorsal paraspinous musculature at L2-3. 11. Constipation. 12. Distal descending colon and sigmoid either demonstrating mild wall thickening or underdistention. Correlate clinically for underlying colitis. 13. Aortic and coronary artery atherosclerosis. Aortic Atherosclerosis (ICD10-I70.0) and Emphysema (ICD10-J43.9). Electronically Signed   By: Almira Bar M.D.   On: 12/28/2022 02:39   CT HEAD WO CONTRAST ( ) Result Date: 12/27/2022 CLINICAL DATA:  Delirium.  History of brain metastases. EXAM: CT HEAD WITHOUT CONTRAST TECHNIQUE: Contiguous axial images were obtained from the base of the skull through the vertex without intravenous contrast. RADIATION DOSE REDUCTION: This exam was performed according to the departmental dose-optimization program which includes automated exposure control, adjustment of the mA and/or kV according to patient size and/or use of iterative reconstruction technique. COMPARISON:  Head CT 11/11/2022.  MRI brain 12/17/2022. FINDINGS: Brain: No evidence of acute infarction, hemorrhage, hydrocephalus, or extra-axial fluid collection. There are 2 rounded hyperdense metastatic lesions in the right frontal cortex seen best on coronal imaging. These are unchanged from prior MRI. There is also 2 mm hyperdense metastatic lesion in the right occipital region best seen on coronal image 5/46. This is not well appreciated on prior MRI. There is no  mass effect. Minimal patchy periventricular white matter hypodensity appears similar to prior. Vascular: No hyperdense vessel or unexpected calcification. Skull: Normal. Negative for fracture or focal lesion. Sinuses/Orbits: No acute finding. Other: None. IMPRESSION: 1. No acute intracranial process. 2. Two stable rounded hyperdense metastatic lesions in the right frontal cortex. There is also a 2 mm hyperdense metastatic lesion in the right occipital region which is not well appreciated on prior MRI. No mass effect. Electronically Signed   By: Darliss Cheney M.D.   On: 12/27/2022 23:20   DG Chest Port 1 View Result Date: 12/27/2022 CLINICAL DATA:  Questionable sepsis - evaluate for abnormality EXAM: PORTABLE CHEST 1 VIEW COMPARISON:  Chest x-ray 11/16/2022, CT angio chest 11/16/2022, PET CT 12/09/2022 FINDINGS: The heart and mediastinal contours are within normal limits. Aortic calcification. Redemonstration of an approximate 1.6 cm left upper quadrant pulmonary better evaluated on PET-CT 12/09/2022. No focal consolidation. No pulmonary edema. No pleural effusion. No pneumothorax. No acute osseous abnormality.  Cervical spine surgical hardware. IMPRESSION: 1. No acute cardiopulmonary abnormality. 2. Redemonstration of an approximate 1.6 cm left upper quadrant pulmonary better evaluated on PET-CT 12/09/2022. 3.  Aortic Atherosclerosis (ICD10-I70.0). Electronically Signed   By: Tish Frederickson M.D.   On: 12/27/2022 23:05   MR BRAIN W WO CONTRAST Addendum Date: 12/17/2022 ADDENDUM REPORT: 12/17/2022 13:41 ADDENDUM: Study discussed by telephone with [name] COURTNEY WHARTON on 12/17/2022 at 1332 hours. Electronically Signed   By: Odessa Fleming M.D.   On: 12/17/2022 13:41   Result Date: 12/17/2022 CLINICAL DATA:  57 year old female with metastatic melanoma. Staging. Study under anesthesia. EXAM: MRI HEAD WITHOUT AND WITH CONTRAST TECHNIQUE: Multiplanar, multiecho pulse sequences of the brain and surrounding structures  were obtained without and with intravenous contrast. CONTRAST:  5mL GADAVIST GADOBUTROL 1 MMOL/ML IV SOLN COMPARISON:  PET-CT 12/09/2022.  Head CT 11/11/2022. FINDINGS: Brain: Several small intrinsic T1 hyperintense (series 8, image 37) and/or enhancing (series 19, image 30) brain lesions compatible with small melanoma metastases. Only 1 of these demonstrates evidence of hemosiderin on SWI (series 7, image 72). No associated cerebral edema or mass effect. Three such small metastases are identified including 3-4 mm at the  right inferior frontal gyrus on series 19, image 30, 3-4 mm at the right operculum image 35, and 2-3 mm at the right middle frontal gyrus on image 38. Although those lesions are relatively clustered, no additional brain metastases, abnormal enhancement, or abnormal susceptibility is identified elsewhere. No dural thickening. No superimposed restricted diffusion to suggest acute infarction. No midline shift, ventriculomegaly, extra-axial collection or acute intracranial hemorrhage. Cervicomedullary junction and pituitary are within normal limits. Scattered nonenhancing white matter T2 and FLAIR hyperintensity is moderate for age, nonspecific but most commonly small vessel related. Vascular: Major intracranial vascular flow voids are preserved. Dominant distal left vertebral artery. Following contrast major dural venous sinuses are enhancing and appear to be patent. Skull and upper cervical spine: Heterogeneous background bone marrow signal. Known osseous metastatic disease, but no destructive lesion identified in the skull or visible cervical spine. Grossly negative visible cervical spinal cord. Sinuses/Orbits: Negative orbits. Minor paranasal sinus mucosal thickening. Other: Minimal left mastoid effusion. Visible internal auditory structures appear normal. Intubated, study under anesthesia. Negative visible scalp and face. IMPRESSION: 1. Three small right hemisphere brain metastases metastases  ranging from 2 mm to 4 mm. Minimal hemosiderin. No associated cerebral edema or mass effect. No other metastatic disease identified in the brain. 2. Known osseous metastatic disease but no destructive lesion identified in the skull or visible cervical spine. 3. Moderate for age cerebral white matter changes most commonly due to small vessel disease. Electronically Signed: By: Odessa Fleming M.D. On: 12/17/2022 13:16   MR LUMBAR SPINE W WO CONTRAST Addendum Date: 12/17/2022 ADDENDUM REPORT: 12/17/2022 13:40 ADDENDUM: Study discussed by telephone with [name] COURTNEY WHARTON on 12/17/2022 at 1332 hours. And note that CLINICAL DATA should read 57 year old female with metastatic melanoma. Staging. Study under anesthesia. Electronically Signed   By: Odessa Fleming M.D.   On: 12/17/2022 13:40   Result Date: 12/17/2022 EXAM: MRI LUMBAR SPINE WITHOUT AND WITH CONTRAST TECHNIQUE: Multiplanar and multiecho pulse sequences of the lumbar spine were obtained without and with intravenous contrast. CONTRAST:  5mL GADAVIST GADOBUTROL 1 MMOL/ML IV SOLN COMPARISON:  Brain MRI today reported separately. PET-CT 12/09/2022. CTA chest abdomen pelvis 11/16/2022. FINDINGS: Segmentation:  Normal on the comparison CTA. Alignment:  Mild straightening of lumbar lordosis now. Vertebrae: Diffuse skeletal metastatic disease throughout the visible spine and pelvis. Scout view demonstrates thoracic vertebral metastases with extension into the ventral epidural space at both the T4 and T8 levels (series 10, image 14). Pathologic fracture of the L5 vertebral body has progressed since 11/16/2022. L5 loss of height is now up to 60%, and there is retropulsed and/or epidural extension of tumor at that level (series 16, image 31), see additional spinal canal details below. Lumbar ventral epidural tumor also at the left L1 vertebral level without spinal stenosis (series 15, image 17), and at the L4 level on the right (series 15, image 25) without spinal stenosis.  Incidental bulky extraosseous tumor also from the right L1 transverse process, 2.2 cm right paraspinal muscle mass there on series 15, image 7. No other pathologic fracture identified. Conus medullaris and cauda equina: Conus extends to the L1 level. No lower spinal cord or conus signal abnormality. But there is an indistinct appearance of the surface of the lower thoracic spinal cord on T2 imaging (series 12, image 8), and indeterminate enhancement pattern at the conus (series 17, image 7). No obvious nerve root thickening or enhancement in the cauda equina. Paraspinal and other soft tissues: Right L1 paraspinal muscle tumor arising from the  posterior elements at that level as above. Partially visible extensive liver tumor. No retroperitoneal, prevertebral lymphadenopathy. Disc levels: No significant lumbar spine degeneration superimposed on the diffuse metastatic disease. Ventral epidural tumor at L2 and L4 without spinal stenosis as above. Bulky tumor retropulsion, epidural extension at the L5 pathologic compression fracture level now resulting in severe spinal stenosis (series 15, image 31 and series 12, image 8), severe lateral recess stenosis (S1 nerve levels) and severe proximal L5 foraminal stenosis. IMPRESSION: 1. Diffuse skeletal metastatic disease. Involvement throughout the visible spine and pelvis. 2. Progressed pathologic fracture of the L5 since 10/21/2024with 60% loss of height and bulky retropulsed/epidural tumor. 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 3. 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 And scout view demonstrates thoracic spine ventral epidural tumor at both T4 and T8 levels. 4. 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 Electronically Signed: By: Odessa Fleming M.D. On: 12/17/2022 13:28

## 2023-01-11 NOTE — Progress Notes (Signed)
Pt had c/o worsened nausea with dry heaving during infusion today. This RN made Dr. Cherly Hensen aware. Per Dr. Cherly Hensen Pt to receive another 8 mg of IV Zofran prior to discharge today. This RN made Pt aware, Pt not happy with plan, but agreeable.  This RN received V/O for dexamethasone 10 mg IV per Lowella Bandy NP to be given for nausea and dry heaving.

## 2023-01-12 ENCOUNTER — Other Ambulatory Visit: Payer: Self-pay

## 2023-01-12 ENCOUNTER — Ambulatory Visit: Payer: BC Managed Care – PPO

## 2023-01-12 ENCOUNTER — Other Ambulatory Visit (HOSPITAL_COMMUNITY): Payer: Self-pay

## 2023-01-12 MED ORDER — DRONABINOL 5 MG PO CAPS
5.0000 mg | ORAL_CAPSULE | Freq: Two times a day (BID) | ORAL | 0 refills | Status: DC
  Filled 2023-01-12: qty 60, 30d supply, fill #0

## 2023-01-13 ENCOUNTER — Telehealth: Payer: Self-pay

## 2023-01-13 ENCOUNTER — Other Ambulatory Visit: Payer: BC Managed Care – PPO

## 2023-01-13 ENCOUNTER — Inpatient Hospital Stay: Payer: BC Managed Care – PPO

## 2023-01-13 VITALS — BP 121/65 | HR 100 | Temp 97.6°F | Resp 19

## 2023-01-13 DIAGNOSIS — Z515 Encounter for palliative care: Secondary | ICD-10-CM | POA: Diagnosis not present

## 2023-01-13 DIAGNOSIS — Z5112 Encounter for antineoplastic immunotherapy: Secondary | ICD-10-CM | POA: Diagnosis not present

## 2023-01-13 DIAGNOSIS — C7931 Secondary malignant neoplasm of brain: Secondary | ICD-10-CM | POA: Diagnosis not present

## 2023-01-13 DIAGNOSIS — G893 Neoplasm related pain (acute) (chronic): Secondary | ICD-10-CM | POA: Diagnosis not present

## 2023-01-13 DIAGNOSIS — C787 Secondary malignant neoplasm of liver and intrahepatic bile duct: Secondary | ICD-10-CM | POA: Diagnosis not present

## 2023-01-13 DIAGNOSIS — C7951 Secondary malignant neoplasm of bone: Secondary | ICD-10-CM

## 2023-01-13 DIAGNOSIS — Z79620 Long term (current) use of immunosuppressive biologic: Secondary | ICD-10-CM | POA: Diagnosis not present

## 2023-01-13 DIAGNOSIS — R630 Anorexia: Secondary | ICD-10-CM | POA: Diagnosis not present

## 2023-01-13 DIAGNOSIS — C7801 Secondary malignant neoplasm of right lung: Secondary | ICD-10-CM | POA: Diagnosis not present

## 2023-01-13 DIAGNOSIS — C4359 Malignant melanoma of other part of trunk: Secondary | ICD-10-CM | POA: Diagnosis not present

## 2023-01-13 DIAGNOSIS — C771 Secondary and unspecified malignant neoplasm of intrathoracic lymph nodes: Secondary | ICD-10-CM | POA: Diagnosis not present

## 2023-01-13 DIAGNOSIS — E860 Dehydration: Secondary | ICD-10-CM

## 2023-01-13 DIAGNOSIS — C7802 Secondary malignant neoplasm of left lung: Secondary | ICD-10-CM | POA: Diagnosis not present

## 2023-01-13 LAB — CMP (CANCER CENTER ONLY)
ALT: 92 U/L — ABNORMAL HIGH (ref 0–44)
AST: 203 U/L (ref 15–41)
Albumin: 2.6 g/dL — ABNORMAL LOW (ref 3.5–5.0)
Alkaline Phosphatase: 812 U/L — ABNORMAL HIGH (ref 38–126)
Anion gap: 2 — ABNORMAL LOW (ref 5–15)
BUN: 21 mg/dL — ABNORMAL HIGH (ref 6–20)
CO2: 31 mmol/L (ref 22–32)
Calcium: 8.1 mg/dL — ABNORMAL LOW (ref 8.9–10.3)
Chloride: 100 mmol/L (ref 98–111)
Creatinine: 0.89 mg/dL (ref 0.44–1.00)
GFR, Estimated: >60 mL/min (ref >60)
Glucose, Bld: 105 mg/dL — ABNORMAL HIGH (ref 70–99)
Potassium: 3.1 mmol/L — ABNORMAL LOW (ref 3.5–5.1)
Sodium: 133 mmol/L — ABNORMAL LOW (ref 135–145)
Total Bilirubin: 1.5 mg/dL — ABNORMAL HIGH (ref <1.2)
Total Protein: 5.2 g/dL — ABNORMAL LOW (ref 6.5–8.1)

## 2023-01-13 LAB — LACTATE DEHYDROGENASE: LDH: 2236 U/L — ABNORMAL HIGH (ref 98–192)

## 2023-01-13 MED ORDER — SODIUM CHLORIDE 0.9 % IV SOLN: INTRAVENOUS | Status: AC

## 2023-01-13 NOTE — Assessment & Plan Note (Signed)
 Continue Marinol

## 2023-01-13 NOTE — Assessment & Plan Note (Signed)
Asymptomatic Continue monitor or change

## 2023-01-13 NOTE — Assessment & Plan Note (Signed)
Continue vemurafenib and cobimetinib Labs on 12/30 with follow up

## 2023-01-13 NOTE — Assessment & Plan Note (Signed)
Secondary to diffuse liver metastases Likely will be limitation to her prognosis

## 2023-01-13 NOTE — Assessment & Plan Note (Signed)
Will use dilaudid as needed Continue follow-up with palliative care

## 2023-01-13 NOTE — Progress Notes (Unsigned)
Patient Care Team: Laura Soho, [name] as PCP - General (Family Medicine) Pickenpack-Cousar, Arty Baumgartner, NP as Nurse Practitioner [facility])  Clinic Day:  01/14/2023  Referring physician: [name], [name]  ASSESSMENT & PLAN:   Assessment & Plan: 57 year old female currently undergoing active treatment for stage IV melanoma.  She is following up with telephone visit today.  Diagnosis: 11/25/2022. Diffuse liver, bone, lung metastases. Brain metastases after starting systemic treatment Treatment: 12/11/2022. First line ipi/nivo. Completed 2 cycles.   Due to sign of clinical progression of liver metastases, new abdominal pain, loss of appetite, changed to BRAF/MEC inhibitors for disease control. Discussed potential side effects. Her insurance only authorized vemurafenib and cobimetinib so she started on 12/14.   This is the first day that she has felt better.  Denies much stomach pain, nausea, vomiting.  She was actually hungry and able to make a self opinion about the sandwich yesterday.  We discussed continue oral cancer medication at this time.  Her rising LFT is due to diffuse metastases.  Will repeat labs at the end of this month.  Melanoma metastatic to liver (HCC) Continue vemurafenib and cobimetinib Labs on 12/30 with follow up  Metastatic cancer to brain [facility]) Asymptomatic Continue monitor or change  Liver dysfunction Secondary to diffuse liver metastases Likely will be limitation to her prognosis  Cancer associated pain Will use dilaudid as needed Continue follow-up with palliative care  Decreased appetite Continue Marinol 5 mg twice daily   Labs on 01/25/23 and then see me at 12:00 Total time: 15 minutes The patient understands the plans discussed today and is in agreement with them.  She knows to contact our office if she develops concerns prior to her next appointment.  Melven Sartorius, MD  [facility]  [facility] - A DEPT OF [facility] [address] [place] [address] Dept: [phone] Dept Fax: [phone]   No orders of the defined types were placed in this encounter.     CHIEF COMPLAINT:  CC: Metastatic melanoma  INTERVAL HISTORY:  Laura [name] is having telephone visit today for repeat clinical assessment. Her husband is with her on the phone.  This is the first time she feels hungry and was able to make herself a peanut butter sandwich and had 1/2 of it without nausea and vomiting.  She denies any significant abdominal pain.  There is no confusion.  She is taking Dilaudid about up to 4 times a day.  She does not always take it.  She is alert and oriented.  She is using Marinol and that helps.  I have reviewed the past medical history, past surgical history, social history and family history with the patient and they are unchanged from previous note.  ALLERGIES:  is allergic to aspirin.  MEDICATIONS:  Current Outpatient Medications  Medication Sig Dispense Refill   AMBIEN CR 6.25 MG CR tablet Take 6.25 mg by mouth at bedtime.     busPIRone (BUSPAR) 15 MG tablet Take 15 mg by mouth 2 (two) times daily.     cobimetinib fumarate (COTELLIC) 20 MG tablet Take 3 tablets (60 mg total) by mouth daily. Take on days 1-21. Repeat every 28 days. Avoid sun exposure. 63 tablet 0   cyclobenzaprine (FLEXERIL) 5 MG tablet Take 1 tablet (5 mg total) by mouth 3 (three) times daily as needed for muscle spasms. Don't take with dilaudid 30 tablet 0   dronabinol (MARINOL) 5 MG  capsule Take 1 capsule (5 mg total) by mouth 2 (two) times daily. 60 capsule 0   famotidine-calcium carbonate-magnesium hydroxide (PEPCID COMPLETE) 10-800-165 MG chewable tablet Chew 1 tablet by mouth in the morning.     HYDROmorphone (DILAUDID) 2 MG tablet Take 1 tablet (2 mg total) by mouth every 6 (six) hours as needed for severe pain (pain score 7-10). 30 tablet 0   LORazepam (ATIVAN) 0.5 MG tablet  Take 1 tab 30 minutes before PET and 2 tabs before MRI (Patient taking differently: Take 0.5-1 mg by mouth See admin instructions. Take 0.5 mg (1 tablet) by mouth 30 minutes before PET scans and 1 mg (2 tablets) before MRI(s)) 30 tablet 0   metoprolol tartrate (LOPRESSOR) 25 MG tablet Take 1 tablet (25 mg total) by mouth 2 (two) times daily. 60 tablet 1   ondansetron (ZOFRAN-ODT) 4 MG disintegrating tablet Dissolve 1 tablet (4 mg total) by mouth every 8 (eight) hours as needed for nausea or vomiting. 20 tablet 0   pantoprazole (PROTONIX) 40 MG tablet Take 1 tablet (40 mg total) by mouth daily. 30 tablet 2   potassium chloride SA (KLOR-CON M) 20 MEQ tablet Take 1 tablet (20 mEq total) by mouth 2 (two) times daily for 5 days. 10 tablet 0   pregabalin (LYRICA) 75 MG capsule Take 1 capsule (75 mg total) by mouth every morning.     rosuvastatin (CRESTOR) 10 MG tablet Take 10 mg by mouth at bedtime.     senna-docusate (SENOKOT-S) 8.6-50 MG tablet Take 1 tablet by mouth 2 (two) times daily.     vemurafenib (ZELBORAF) 240 MG tablet Take 4 tablets (960 mg total) by mouth every 12 (twelve) hours. Take with water. 240 tablet 0   No current facility-administered medications for this visit.    HISTORY OF PRESENT ILLNESS:   Oncology History  Melanoma metastatic to liver (HCC)  11/16/2022 Imaging   She presented to the emergency room on 11/16/2022 for back pain x 2 weeks and central chest pain for several months   CTA CAP IMPRESSION: 1. Negative for aortic dissection or aneurysm. 2. 1.2 cm left upper lobe nodule.  For 3. Multiple ill-defined liver lesions, suspicious for metastatic disease. 4. Multiple lytic osseous lesions, suspicious for metastatic disease. Pathologic fracture of the superior endplate of L5   11/25/2022 Pathology Results   1. Liver, needle/core biopsy, Left hepatic lobe lesion :       - METASTATIC MELANOMA.    12/04/2022 Initial Diagnosis   Melanoma of skin (HCC) Liver, bone  metastases. 1.2 cm LUL nodule   12/09/2022 PET scan   PET: Diffuse bone and liver metastases.  Lung metastases.    12/11/2022 -  Chemotherapy   Patient is on Treatment Plan : MELANOMA Nivolumab (1) + Ipilimumab (3) q21d / Nivolumab (480) q28d     12/17/2022 Imaging   MRI brain 1. Three small right hemisphere brain metastases metastases ranging from 2 mm to 4 mm. Minimal hemosiderin. No associated cerebral edema or mass effect. No other metastatic disease identified in the brain.   2. Known osseous metastatic disease but no destructive lesion identified in the skull or visible cervical spine.   3. Moderate for age cerebral white matter changes most commonly due to small vessel disease.       REVIEW OF SYSTEMS:   All relevant systems were reviewed with the patient and are negative.   VITALS:  There were no vitals taken for this visit.  Wt Readings from Last  3 Encounters:  01/11/23 136 lb 8 oz (61.9 kg)  01/07/23 139 lb 14.4 oz (63.5 kg)  01/05/23 131 lb 13.4 oz (59.8 kg)    There is no height or weight on file to calculate BMI.  Performance status (ECOG): 2 - Symptomatic, <50% confined to bed  PHYSICAL EXAM:   NA  LABORATORY DATA:  I have reviewed the data as listed    Component Value Date/Time   NA 133 (L) 01/13/2023 1443   K 3.1 (L) 01/13/2023 1443   CL 100 01/13/2023 1443   CO2 31 01/13/2023 1443   GLUCOSE 105 (H) 01/13/2023 1443   BUN 21 (H) 01/13/2023 1443   CREATININE 0.89 01/13/2023 1443   CALCIUM 8.1 (L) 01/13/2023 1443   PROT 5.2 (L) 01/13/2023 1443   ALBUMIN 2.6 (L) 01/13/2023 1443   AST 203 (HH) 01/13/2023 1443   ALT 92 (H) 01/13/2023 1443   ALKPHOS 812 (H) 01/13/2023 1443   BILITOT 1.5 (H) 01/13/2023 1443   GFRNONAA >60 01/13/2023 1443   GFRAA >60 04/20/2018 0823    No results found for: "SPEP", "UPEP"  Lab Results  Component Value Date   WBC 8.9 01/11/2023   NEUTROABS 6.4 01/11/2023   HGB 10.6 (L) 01/11/2023   HCT 31.8 (L) 01/11/2023    MCV 92.7 01/11/2023   PLT 340 01/11/2023      Chemistry      Component Value Date/Time   NA 133 (L) 01/13/2023 1443   K 3.1 (L) 01/13/2023 1443   CL 100 01/13/2023 1443   CO2 31 01/13/2023 1443   BUN 21 (H) 01/13/2023 1443   CREATININE 0.89 01/13/2023 1443      Component Value Date/Time   CALCIUM 8.1 (L) 01/13/2023 1443   ALKPHOS 812 (H) 01/13/2023 1443   AST 203 (HH) 01/13/2023 1443   ALT 92 (H) 01/13/2023 1443   BILITOT 1.5 (H) 01/13/2023 1443       RADIOGRAPHIC STUDIES: I have personally reviewed the radiological images as listed and agreed with the findings in the report. DG Chest 2 View Result Date: 01/03/2023 CLINICAL DATA:  Sepsis EXAM: CHEST - 2 VIEW COMPARISON:  12/27/2022 FINDINGS: The heart size and mediastinal contours are within normal limits. Trace pleural effusions. Unchanged left upper lobe lung nodule. The visualized skeletal structures are unremarkable. IMPRESSION: 1. Trace pleural effusions. No focal airspace opacity. 2.  Unchanged left upper lobe lung nodule. Electronically Signed   By: [name]   On: 01/03/2023 15:59   CT Angio Chest PE W and/or Wo Contrast Result Date: 12/28/2022 CLINICAL DATA:  Patient with metastatic melanoma, new transaminitis, pulmonary embolism suspected. EXAM: CT ANGIOGRAPHY CHEST CT ABDOMEN AND PELVIS WITH CONTRAST TECHNIQUE: Multidetector CT imaging of the chest was performed using the standard protocol during bolus administration of intravenous contrast. Multiplanar CT image reconstructions and MIPs were obtained to evaluate the vascular anatomy. Multidetector CT imaging of the abdomen and pelvis was performed using the standard protocol during bolus administration of intravenous contrast. RADIATION DOSE REDUCTION: This exam was performed according to the departmental dose-optimization program which includes automated exposure control, adjustment of the mA and/or kV according to patient size and/or use of iterative  reconstruction technique. CONTRAST:  OMNIPAQUE IOHEXOL 350 MG/ML SOLN COMPARISON:  PET-CT recently 12/09/2022, and CTA chest, abdomen and pelvis 11/16/2022. FINDINGS: CTA CHEST FINDINGS Cardiovascular: The cardiac size is normal. No arterial dilatation or embolism is seen. Minimal pericardial effusion anteriorly. Scattered calcific plaques lad coronary artery. Mild aortic atherosclerosis. There  is no aneurysm, stenosis or dissection. The great vessels are widely patent. Pulmonary veins are nondistended. Mediastinum/Nodes: Enlarged superior left hilar lymph node is 1.3 cm in short axis, previously 1.1 cm. No other intrathoracic adenopathy is seen. 1.2 cm hypodense nodule in the inferior pole of the left lobe of the thyroid gland is unchanged. No follow-up imaging is recommended. Lungs/Pleura: 1.5 cm pulmonary metastasis left upper lobe, level of the aortic arch, unchanged from the PET-CT, 11/16/2022 was 1.2 cm. There are several new nodules in the right middle lobe, most are 3 mm or smaller for example on series 13 axial images 80 6-99. Largest of these is 5 mm anteriorly on 13:90, concerning for new metastases. In the right lower lobe there is a new 5 mm nodule about the fissure on 13:90. Mild paraseptal emphysema again seen in the lung apices. There are coarse atelectatic bands in the posterior bases. On the left there is increased conspicuity of fissural nodules in the lower lung field, largest of these is about 3 mm. There is a new nodule in the left lower lobe measuring 5 mm on 13:99 and a new 3 mm apical left lung nodule on 13:44. There is no pleural effusion, no other new lung abnormality. No focal consolidation. Musculoskeletal: There is chronic interbody fusion hardware in the lower cervical spine. Multifocal lytic metastatic disease particularly in the thoracic spine but also in the ribs and sternum, with pathologic compression fracture of the T3 vertebral body, worsening lytic metastasis T4 body with  extension into the spinal canal and increased erosion through the posterior cortex, with increased soft tissue infiltration into the left T3-4 foramen. Similar increased posterior cortical erosion and metastatic extension into the spinal canal at T8. Mass effect on the cord could be present at these levels. Review of the MIP images confirms the above findings. CT ABDOMEN and PELVIS FINDINGS Hepatobiliary: Innumerable diffuse metastases to the liver largest is in segment 2, today 7.5 cm, on the PET-CT 7.4 cm, the next largest is medially in segment 5 measuring 3.9 cm, previously 3.3 cm. Gallbladder is absent without bile duct dilatation. Pancreas: No abnormality. Spleen: New scattered nonspecific subcentimeter hypodensities are noted in the inferior aspect of the spleen, possible metastases. Largest is 8 mm on 19:24. Adrenals/Urinary Tract: Stable 1 cm left adrenal nodule, was not hypermetabolic on PET-CT. No right adrenal or broad or renal mass. No urinary stone or obstruction. Unremarkable bladder for the degree of distention. Stomach/Bowel: Unremarkable contracted stomach. Normal caliber unopacified small bowel. Normal appendix. Moderate fecal stasis ascending and transverse colon. Distal descending colon and sigmoid either demonstrating mild wall thickening or underdistention. Unremarkable rectum. Vascular/Lymphatic: Aortic atherosclerosis. No enlarged abdominal or pelvic lymph nodes. Reproductive: Status post hysterectomy. No adnexal masses. Multiple pelvic phleboliths. Other: No ascites or free air, no incarcerated hernia. Musculoskeletal: Progressive lytic metastatic disease, including compared with 12/09/2022, is noted at multiple levels of the lumbar spine, throughout the sacrum and bony pelvis, and with multiple small lytic lesions in both proximal femurs. There is increased pathologic compression fracture of the L5 vertebral body with soft tissue herniation into the spinal canal severely narrowing the  spinal canal from the L4-5 to the L5-S1 level. There is an enhancing mass in the right dorsal paraspinous musculature at L2-3 measuring 2.3 cm and was seen on 12/09/2022. Destructive mass of S3 to the left today measures 3 cm, previously 2.6 cm. Review of the MIP images confirms the above findings. IMPRESSION: 1. No evidence of arterial dilatation or  embolism. 2. 1.5 cm pulmonary metastasis left upper lobe, unchanged from the PET-CT, 1.2 cm on 11/16/2022. 3. Multiple new nodules in the right middle lobe, right lower lobe, and left apical lung, largest 5 mm, concerning for new metastases. 4. Increased conspicuity of fissural nodules in the left lower lung field. 5. Increased size of a left hilar lymph node. 6. Innumerable diffuse metastases to the liver, largest 7.5 cm in segment 2, previously 7.4 cm. 7. New scattered subcentimeter hypodensities in the inferior spleen, possible metastases. 8. Progressive lytic metastatic disease throughout the spine, sacrum, bony pelvis, and proximal femurs. 9. Increased pathologic compression fracture of the T3 and L5 vertebral body with soft tissue herniation into the spinal canal severely narrowing the spinal canal from the L4-5 to the L5-S1 level, and in the thoracic spine with increased soft tissue infiltration of the spinal canal at T4 and T8, and in the left T3-4 foramen. 10. Stable 2.3 cm enhancing mass in the right dorsal paraspinous musculature at L2-3. 11. Constipation. 12. Distal descending colon and sigmoid either demonstrating mild wall thickening or underdistention. Correlate clinically for underlying colitis. 13. Aortic and coronary artery atherosclerosis. Aortic Atherosclerosis (ICD10-I70.0) and Emphysema (ICD10-J43.9). Electronically Signed   By: Almira Bar M.D.   On: 12/28/2022 02:39   CT ABDOMEN PELVIS W CONTRAST Result Date: 12/28/2022 CLINICAL DATA:  Patient with metastatic melanoma, new transaminitis, pulmonary embolism suspected. EXAM: CT ANGIOGRAPHY  CHEST CT ABDOMEN AND PELVIS WITH CONTRAST TECHNIQUE: Multidetector CT imaging of the chest was performed using the standard protocol during bolus administration of intravenous contrast. Multiplanar CT image reconstructions and MIPs were obtained to evaluate the vascular anatomy. Multidetector CT imaging of the abdomen and pelvis was performed using the standard protocol during bolus administration of intravenous contrast. RADIATION DOSE REDUCTION: This exam was performed according to the departmental dose-optimization program which includes automated exposure control, adjustment of the mA and/or kV according to patient size and/or use of iterative reconstruction technique. CONTRAST:  OMNIPAQUE IOHEXOL 350 MG/ML SOLN COMPARISON:  PET-CT recently 12/09/2022, and CTA chest, abdomen and pelvis 11/16/2022. FINDINGS: CTA CHEST FINDINGS Cardiovascular: The cardiac size is normal. No arterial dilatation or embolism is seen. Minimal pericardial effusion anteriorly. Scattered calcific plaques lad coronary artery. Mild aortic atherosclerosis. There is no aneurysm, stenosis or dissection. The great vessels are widely patent. Pulmonary veins are nondistended. Mediastinum/Nodes: Enlarged superior left hilar lymph node is 1.3 cm in short axis, previously 1.1 cm. No other intrathoracic adenopathy is seen. 1.2 cm hypodense nodule in the inferior pole of the left lobe of the thyroid gland is unchanged. No follow-up imaging is recommended. Lungs/Pleura: 1.5 cm pulmonary metastasis left upper lobe, level of the aortic arch, unchanged from the PET-CT, 11/16/2022 was 1.2 cm. There are several new nodules in the right middle lobe, most are 3 mm or smaller for example on series 13 axial images 80 6-99. Largest of these is 5 mm anteriorly on 13:90, concerning for new metastases. In the right lower lobe there is a new 5 mm nodule about the fissure on 13:90. Mild paraseptal emphysema again seen in the lung apices. There are coarse  atelectatic bands in the posterior bases. On the left there is increased conspicuity of fissural nodules in the lower lung field, largest of these is about 3 mm. There is a new nodule in the left lower lobe measuring 5 mm on 13:99 and a new 3 mm apical left lung nodule on 13:44. There is no pleural effusion, no other new  lung abnormality. No focal consolidation. Musculoskeletal: There is chronic interbody fusion hardware in the lower cervical spine. Multifocal lytic metastatic disease particularly in the thoracic spine but also in the ribs and sternum, with pathologic compression fracture of the T3 vertebral body, worsening lytic metastasis T4 body with extension into the spinal canal and increased erosion through the posterior cortex, with increased soft tissue infiltration into the left T3-4 foramen. Similar increased posterior cortical erosion and metastatic extension into the spinal canal at T8. Mass effect on the cord could be present at these levels. Review of the MIP images confirms the above findings. CT ABDOMEN and PELVIS FINDINGS Hepatobiliary: Innumerable diffuse metastases to the liver largest is in segment 2, today 7.5 cm, on the PET-CT 7.4 cm, the next largest is medially in segment 5 measuring 3.9 cm, previously 3.3 cm. Gallbladder is absent without bile duct dilatation. Pancreas: No abnormality. Spleen: New scattered nonspecific subcentimeter hypodensities are noted in the inferior aspect of the spleen, possible metastases. Largest is 8 mm on 19:24. Adrenals/Urinary Tract: Stable 1 cm left adrenal nodule, was not hypermetabolic on PET-CT. No right adrenal or broad or renal mass. No urinary stone or obstruction. Unremarkable bladder for the degree of distention. Stomach/Bowel: Unremarkable contracted stomach. Normal caliber unopacified small bowel. Normal appendix. Moderate fecal stasis ascending and transverse colon. Distal descending colon and sigmoid either demonstrating mild wall thickening or  underdistention. Unremarkable rectum. Vascular/Lymphatic: Aortic atherosclerosis. No enlarged abdominal or pelvic lymph nodes. Reproductive: Status post hysterectomy. No adnexal masses. Multiple pelvic phleboliths. Other: No ascites or free air, no incarcerated hernia. Musculoskeletal: Progressive lytic metastatic disease, including compared with 12/09/2022, is noted at multiple levels of the lumbar spine, throughout the sacrum and bony pelvis, and with multiple small lytic lesions in both proximal femurs. There is increased pathologic compression fracture of the L5 vertebral body with soft tissue herniation into the spinal canal severely narrowing the spinal canal from the L4-5 to the L5-S1 level. There is an enhancing mass in the right dorsal paraspinous musculature at L2-3 measuring 2.3 cm and was seen on 12/09/2022. Destructive mass of S3 to the left today measures 3 cm, previously 2.6 cm. Review of the MIP images confirms the above findings. IMPRESSION: 1. No evidence of arterial dilatation or embolism. 2. 1.5 cm pulmonary metastasis left upper lobe, unchanged from the PET-CT, 1.2 cm on 11/16/2022. 3. Multiple new nodules in the right middle lobe, right lower lobe, and left apical lung, largest 5 mm, concerning for new metastases. 4. Increased conspicuity of fissural nodules in the left lower lung field. 5. Increased size of a left hilar lymph node. 6. Innumerable diffuse metastases to the liver, largest 7.5 cm in segment 2, previously 7.4 cm. 7. New scattered subcentimeter hypodensities in the inferior spleen, possible metastases. 8. Progressive lytic metastatic disease throughout the spine, sacrum, bony pelvis, and proximal femurs. 9. Increased pathologic compression fracture of the T3 and L5 vertebral body with soft tissue herniation into the spinal canal severely narrowing the spinal canal from the L4-5 to the L5-S1 level, and in the thoracic spine with increased soft tissue infiltration of the spinal canal  at T4 and T8, and in the left T3-4 foramen. 10. Stable 2.3 cm enhancing mass in the right dorsal paraspinous musculature at L2-3. 11. Constipation. 12. Distal descending colon and sigmoid either demonstrating mild wall thickening or underdistention. Correlate clinically for underlying colitis. 13. Aortic and coronary artery atherosclerosis. Aortic Atherosclerosis (ICD10-I70.0) and Emphysema (ICD10-J43.9). Electronically Signed   By: Mellody Dance  Chesser M.D.   On: 12/28/2022 02:39   CT HEAD WO CONTRAST ( ) Result Date: 12/27/2022 CLINICAL DATA:  Delirium.  History of brain metastases. EXAM: CT HEAD WITHOUT CONTRAST TECHNIQUE: Contiguous axial images were obtained from the base of the skull through the vertex without intravenous contrast. RADIATION DOSE REDUCTION: This exam was performed according to the departmental dose-optimization program which includes automated exposure control, adjustment of the mA and/or kV according to patient size and/or use of iterative reconstruction technique. COMPARISON:  Head CT 11/11/2022.  MRI brain 12/17/2022. FINDINGS: Brain: No evidence of acute infarction, hemorrhage, hydrocephalus, or extra-axial fluid collection. There are 2 rounded hyperdense metastatic lesions in the right frontal cortex seen best on coronal imaging. These are unchanged from prior MRI. There is also 2 mm hyperdense metastatic lesion in the right occipital region best seen on coronal image 5/46. This is not well appreciated on prior MRI. There is no mass effect. Minimal patchy periventricular white matter hypodensity appears similar to prior. Vascular: No hyperdense vessel or unexpected calcification. Skull: Normal. Negative for fracture or focal lesion. Sinuses/Orbits: No acute finding. Other: None. IMPRESSION: 1. No acute intracranial process. 2. Two stable rounded hyperdense metastatic lesions in the right frontal cortex. There is also a 2 mm hyperdense metastatic lesion in the right occipital region which  is not well appreciated on prior MRI. No mass effect. Electronically Signed   By: Darliss Cheney M.D.   On: 12/27/2022 23:20   DG Chest Port 1 View Result Date: 12/27/2022 CLINICAL DATA:  Questionable sepsis - evaluate for abnormality EXAM: PORTABLE CHEST 1 VIEW COMPARISON:  Chest x-ray 11/16/2022, CT angio chest 11/16/2022, PET CT 12/09/2022 FINDINGS: The heart and mediastinal contours are within normal limits. Aortic calcification. Redemonstration of an approximate 1.6 cm left upper quadrant pulmonary better evaluated on PET-CT 12/09/2022. No focal consolidation. No pulmonary edema. No pleural effusion. No pneumothorax. No acute osseous abnormality.  Cervical spine surgical hardware. IMPRESSION: 1. No acute cardiopulmonary abnormality. 2. Redemonstration of an approximate 1.6 cm left upper quadrant pulmonary better evaluated on PET-CT 12/09/2022. 3.  Aortic Atherosclerosis (ICD10-I70.0). Electronically Signed   By: Tish Frederickson M.D.   On: 12/27/2022 23:05   MR BRAIN W WO CONTRAST Addendum Date: 12/17/2022 ADDENDUM REPORT: 12/17/2022 13:41 ADDENDUM: Study discussed by telephone with [name] COURTNEY WHARTON on 12/17/2022 at 1332 hours. Electronically Signed   By: Odessa Fleming M.D.   On: 12/17/2022 13:41   Result Date: 12/17/2022 CLINICAL DATA:  57 year old female with metastatic melanoma. Staging. Study under anesthesia. EXAM: MRI HEAD WITHOUT AND WITH CONTRAST TECHNIQUE: Multiplanar, multiecho pulse sequences of the brain and surrounding structures were obtained without and with intravenous contrast. CONTRAST:  5mL GADAVIST GADOBUTROL 1 MMOL/ML IV SOLN COMPARISON:  PET-CT 12/09/2022.  Head CT 11/11/2022. FINDINGS: Brain: Several small intrinsic T1 hyperintense (series 8, image 37) and/or enhancing (series 19, image 30) brain lesions compatible with small melanoma metastases. Only 1 of these demonstrates evidence of hemosiderin on SWI (series 7, image 72). No associated cerebral edema or mass effect. Three such  small metastases are identified including 3-4 mm at the right inferior frontal gyrus on series 19, image 30, 3-4 mm at the right operculum image 35, and 2-3 mm at the right middle frontal gyrus on image 38. Although those lesions are relatively clustered, no additional brain metastases, abnormal enhancement, or abnormal susceptibility is identified elsewhere. No dural thickening. No superimposed restricted diffusion to suggest acute infarction. No midline shift, ventriculomegaly, extra-axial collection or acute intracranial hemorrhage. Cervicomedullary  junction and pituitary are within normal limits. Scattered nonenhancing white matter T2 and FLAIR hyperintensity is moderate for age, nonspecific but most commonly small vessel related. Vascular: Major intracranial vascular flow voids are preserved. Dominant distal left vertebral artery. Following contrast major dural venous sinuses are enhancing and appear to be patent. Skull and upper cervical spine: Heterogeneous background bone marrow signal. Known osseous metastatic disease, but no destructive lesion identified in the skull or visible cervical spine. Grossly negative visible cervical spinal cord. Sinuses/Orbits: Negative orbits. Minor paranasal sinus mucosal thickening. Other: Minimal left mastoid effusion. Visible internal auditory structures appear normal. Intubated, study under anesthesia. Negative visible scalp and face. IMPRESSION: 1. Three small right hemisphere brain metastases metastases ranging from 2 mm to 4 mm. Minimal hemosiderin. No associated cerebral edema or mass effect. No other metastatic disease identified in the brain. 2. Known osseous metastatic disease but no destructive lesion identified in the skull or visible cervical spine. 3. Moderate for age cerebral white matter changes most commonly due to small vessel disease. Electronically Signed: By: Odessa Fleming M.D. On: 12/17/2022 13:16   MR LUMBAR SPINE W WO CONTRAST Addendum Date:  12/17/2022 ADDENDUM REPORT: 12/17/2022 13:40 ADDENDUM: Study discussed by telephone with [name] COURTNEY WHARTON on 12/17/2022 at 1332 hours. And note that CLINICAL DATA should read 57 year old female with metastatic melanoma. Staging. Study under anesthesia. Electronically Signed   By: Odessa Fleming M.D.   On: 12/17/2022 13:40   Result Date: 12/17/2022 EXAM: MRI LUMBAR SPINE WITHOUT AND WITH CONTRAST TECHNIQUE: Multiplanar and multiecho pulse sequences of the lumbar spine were obtained without and with intravenous contrast. CONTRAST:  5mL GADAVIST GADOBUTROL 1 MMOL/ML IV SOLN COMPARISON:  Brain MRI today reported separately. PET-CT 12/09/2022. CTA chest abdomen pelvis 11/16/2022. FINDINGS: Segmentation:  Normal on the comparison CTA. Alignment:  Mild straightening of lumbar lordosis now. Vertebrae: Diffuse skeletal metastatic disease throughout the visible spine and pelvis. Scout view demonstrates thoracic vertebral metastases with extension into the ventral epidural space at both the T4 and T8 levels (series 10, image 14). Pathologic fracture of the L5 vertebral body has progressed since 11/16/2022. L5 loss of height is now up to 60%, and there is retropulsed and/or epidural extension of tumor at that level (series 16, image 31), see additional spinal canal details below. Lumbar ventral epidural tumor also at the left L1 vertebral level without spinal stenosis (series 15, image 17), and at the L4 level on the right (series 15, image 25) without spinal stenosis. Incidental bulky extraosseous tumor also from the right L1 transverse process, 2.2 cm right paraspinal muscle mass there on series 15, image 7. No other pathologic fracture identified. Conus medullaris and cauda equina: Conus extends to the L1 level. No lower spinal cord or conus signal abnormality. But there is an indistinct appearance of the surface of the lower thoracic spinal cord on T2 imaging (series 12, image 8), and indeterminate enhancement pattern at  the conus (series 17, image 7). No obvious nerve root thickening or enhancement in the cauda equina. Paraspinal and other soft tissues: Right L1 paraspinal muscle tumor arising from the posterior elements at that level as above. Partially visible extensive liver tumor. No retroperitoneal, prevertebral lymphadenopathy. Disc levels: No significant lumbar spine degeneration superimposed on the diffuse metastatic disease. Ventral epidural tumor at L2 and L4 without spinal stenosis as above. Bulky tumor retropulsion, epidural extension at the L5 pathologic compression fracture level now resulting in severe spinal stenosis (series 15, image 31 and series 12, image 8), severe  lateral recess stenosis (S1 nerve levels) and severe proximal L5 foraminal stenosis. IMPRESSION: 1. Diffuse skeletal metastatic disease. Involvement throughout the visible spine and pelvis. 2. Progressed pathologic fracture of the L5 since 10/21/2024with 60% loss of height and bulky retropulsed/epidural tumor. 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 3. 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 And scout view demonstrates thoracic spine ventral epidural tumor at both T4 and T8 levels. 4. 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 Electronically Signed: By: Odessa Fleming M.D. On: 12/17/2022 13:28

## 2023-01-13 NOTE — Telephone Encounter (Signed)
 CRITICAL VALUE STICKER  CRITICAL VALUE:AST [id number]  RECEIVER (on-site recipient of call):Mandy , RN   DATE & TIME NOTIFIED: 12/18 at approx 1540  MESSENGER (representative from lab):Hilda Lias  MD NOTIFIED: Dr. Cherly Hensen   TIME OF NOTIFICATION:01/13/23 at approx 1543  RESPONSE:  No changes or new orders

## 2023-01-14 ENCOUNTER — Ambulatory Visit: Payer: BC Managed Care – PPO

## 2023-01-14 ENCOUNTER — Inpatient Hospital Stay: Payer: BC Managed Care – PPO

## 2023-01-14 DIAGNOSIS — C787 Secondary malignant neoplasm of liver and intrahepatic bile duct: Secondary | ICD-10-CM | POA: Diagnosis not present

## 2023-01-14 DIAGNOSIS — R630 Anorexia: Secondary | ICD-10-CM

## 2023-01-14 DIAGNOSIS — G893 Neoplasm related pain (acute) (chronic): Secondary | ICD-10-CM | POA: Diagnosis not present

## 2023-01-14 DIAGNOSIS — C7931 Secondary malignant neoplasm of brain: Secondary | ICD-10-CM

## 2023-01-14 DIAGNOSIS — K7689 Other specified diseases of liver: Secondary | ICD-10-CM

## 2023-01-15 ENCOUNTER — Inpatient Hospital Stay: Payer: BC Managed Care – PPO

## 2023-01-15 ENCOUNTER — Other Ambulatory Visit: Payer: Self-pay

## 2023-01-15 VITALS — BP 128/78 | HR 97 | Temp 97.4°F | Resp 18 | Wt 136.1 lb

## 2023-01-15 DIAGNOSIS — G893 Neoplasm related pain (acute) (chronic): Secondary | ICD-10-CM | POA: Diagnosis not present

## 2023-01-15 DIAGNOSIS — C7802 Secondary malignant neoplasm of left lung: Secondary | ICD-10-CM | POA: Diagnosis not present

## 2023-01-15 DIAGNOSIS — C7931 Secondary malignant neoplasm of brain: Secondary | ICD-10-CM | POA: Diagnosis not present

## 2023-01-15 DIAGNOSIS — C7801 Secondary malignant neoplasm of right lung: Secondary | ICD-10-CM | POA: Diagnosis not present

## 2023-01-15 DIAGNOSIS — C787 Secondary malignant neoplasm of liver and intrahepatic bile duct: Secondary | ICD-10-CM | POA: Diagnosis not present

## 2023-01-15 DIAGNOSIS — R630 Anorexia: Secondary | ICD-10-CM | POA: Diagnosis not present

## 2023-01-15 DIAGNOSIS — E860 Dehydration: Secondary | ICD-10-CM

## 2023-01-15 DIAGNOSIS — C7951 Secondary malignant neoplasm of bone: Secondary | ICD-10-CM | POA: Diagnosis not present

## 2023-01-15 DIAGNOSIS — C771 Secondary and unspecified malignant neoplasm of intrathoracic lymph nodes: Secondary | ICD-10-CM | POA: Diagnosis not present

## 2023-01-15 DIAGNOSIS — C4359 Malignant melanoma of other part of trunk: Secondary | ICD-10-CM | POA: Diagnosis not present

## 2023-01-15 DIAGNOSIS — Z79620 Long term (current) use of immunosuppressive biologic: Secondary | ICD-10-CM | POA: Diagnosis not present

## 2023-01-15 DIAGNOSIS — Z5112 Encounter for antineoplastic immunotherapy: Secondary | ICD-10-CM | POA: Diagnosis not present

## 2023-01-15 DIAGNOSIS — Z515 Encounter for palliative care: Secondary | ICD-10-CM | POA: Diagnosis not present

## 2023-01-15 MED ORDER — SODIUM CHLORIDE 0.9 % IV SOLN: INTRAVENOUS | Status: DC

## 2023-01-15 NOTE — Patient Instructions (Signed)

## 2023-01-16 ENCOUNTER — Other Ambulatory Visit: Payer: Self-pay

## 2023-01-16 DIAGNOSIS — C787 Secondary malignant neoplasm of liver and intrahepatic bile duct: Secondary | ICD-10-CM

## 2023-01-16 NOTE — Progress Notes (Signed)
Please add lab, fluid and appointment with me at 1PM on 12/30.

## 2023-01-17 ENCOUNTER — Other Ambulatory Visit: Payer: Self-pay | Admitting: Nurse Practitioner

## 2023-01-17 DIAGNOSIS — C7931 Secondary malignant neoplasm of brain: Secondary | ICD-10-CM

## 2023-01-17 DIAGNOSIS — R110 Nausea: Secondary | ICD-10-CM

## 2023-01-17 MED ORDER — ONDANSETRON 4 MG PO TBDP
4.0000 mg | ORAL_TABLET | Freq: Three times a day (TID) | ORAL | 0 refills | Status: DC | PRN
  Filled 2023-01-17: qty 60, 20d supply, fill #0
  Filled 2023-01-19: qty 18, 6d supply, fill #0

## 2023-01-18 ENCOUNTER — Telehealth: Payer: Self-pay

## 2023-01-18 ENCOUNTER — Other Ambulatory Visit (HOSPITAL_COMMUNITY): Payer: Self-pay

## 2023-01-18 NOTE — Telephone Encounter (Signed)
Collaborative delivered forms at this time.  Now in queue to process per protocol.

## 2023-01-18 NOTE — Telephone Encounter (Signed)
Approx 9:30 am: TC to pt's husband to inform him that his FMLA forms have still not been faxed to Dr. Nelta Numbers office. Verified fax number, and he is calling to request that fax be sent again.  12:00 No fax received. TC to pt's husband, who asks to email the forms. Instructed him to email this RN's [facility] email w/ "Secure" typed in the subject line. He does so, and forms were printed and handed to Rosalin Winston-Spruiell.

## 2023-01-19 ENCOUNTER — Inpatient Hospital Stay: Payer: BC Managed Care – PPO

## 2023-01-19 ENCOUNTER — Other Ambulatory Visit (HOSPITAL_COMMUNITY): Payer: Self-pay

## 2023-01-19 VITALS — BP 115/60 | HR 82 | Temp 98.8°F | Resp 17

## 2023-01-19 DIAGNOSIS — R630 Anorexia: Secondary | ICD-10-CM | POA: Diagnosis not present

## 2023-01-19 DIAGNOSIS — Z515 Encounter for palliative care: Secondary | ICD-10-CM | POA: Diagnosis not present

## 2023-01-19 DIAGNOSIS — C787 Secondary malignant neoplasm of liver and intrahepatic bile duct: Secondary | ICD-10-CM | POA: Diagnosis not present

## 2023-01-19 DIAGNOSIS — C7951 Secondary malignant neoplasm of bone: Secondary | ICD-10-CM

## 2023-01-19 DIAGNOSIS — Z79620 Long term (current) use of immunosuppressive biologic: Secondary | ICD-10-CM | POA: Diagnosis not present

## 2023-01-19 DIAGNOSIS — C7931 Secondary malignant neoplasm of brain: Secondary | ICD-10-CM | POA: Diagnosis not present

## 2023-01-19 DIAGNOSIS — C7801 Secondary malignant neoplasm of right lung: Secondary | ICD-10-CM | POA: Diagnosis not present

## 2023-01-19 DIAGNOSIS — E860 Dehydration: Secondary | ICD-10-CM

## 2023-01-19 DIAGNOSIS — C7802 Secondary malignant neoplasm of left lung: Secondary | ICD-10-CM | POA: Diagnosis not present

## 2023-01-19 DIAGNOSIS — C771 Secondary and unspecified malignant neoplasm of intrathoracic lymph nodes: Secondary | ICD-10-CM | POA: Diagnosis not present

## 2023-01-19 DIAGNOSIS — G893 Neoplasm related pain (acute) (chronic): Secondary | ICD-10-CM | POA: Diagnosis not present

## 2023-01-19 DIAGNOSIS — C4359 Malignant melanoma of other part of trunk: Secondary | ICD-10-CM | POA: Diagnosis not present

## 2023-01-19 DIAGNOSIS — Z5112 Encounter for antineoplastic immunotherapy: Secondary | ICD-10-CM | POA: Diagnosis not present

## 2023-01-19 MED ORDER — SODIUM CHLORIDE 0.9 % IV SOLN: INTRAVENOUS | Status: AC

## 2023-01-19 NOTE — Patient Instructions (Signed)

## 2023-01-21 ENCOUNTER — Inpatient Hospital Stay: Payer: BC Managed Care – PPO

## 2023-01-21 VITALS — BP 120/70 | HR 87

## 2023-01-21 DIAGNOSIS — C771 Secondary and unspecified malignant neoplasm of intrathoracic lymph nodes: Secondary | ICD-10-CM | POA: Diagnosis not present

## 2023-01-21 DIAGNOSIS — R630 Anorexia: Secondary | ICD-10-CM | POA: Diagnosis not present

## 2023-01-21 DIAGNOSIS — E860 Dehydration: Secondary | ICD-10-CM

## 2023-01-21 DIAGNOSIS — Z515 Encounter for palliative care: Secondary | ICD-10-CM | POA: Diagnosis not present

## 2023-01-21 DIAGNOSIS — C7801 Secondary malignant neoplasm of right lung: Secondary | ICD-10-CM | POA: Diagnosis not present

## 2023-01-21 DIAGNOSIS — C7931 Secondary malignant neoplasm of brain: Secondary | ICD-10-CM | POA: Diagnosis not present

## 2023-01-21 DIAGNOSIS — C787 Secondary malignant neoplasm of liver and intrahepatic bile duct: Secondary | ICD-10-CM | POA: Diagnosis not present

## 2023-01-21 DIAGNOSIS — G893 Neoplasm related pain (acute) (chronic): Secondary | ICD-10-CM | POA: Diagnosis not present

## 2023-01-21 DIAGNOSIS — C7951 Secondary malignant neoplasm of bone: Secondary | ICD-10-CM | POA: Diagnosis not present

## 2023-01-21 DIAGNOSIS — C4359 Malignant melanoma of other part of trunk: Secondary | ICD-10-CM | POA: Diagnosis not present

## 2023-01-21 DIAGNOSIS — Z5112 Encounter for antineoplastic immunotherapy: Secondary | ICD-10-CM | POA: Diagnosis not present

## 2023-01-21 DIAGNOSIS — C7802 Secondary malignant neoplasm of left lung: Secondary | ICD-10-CM | POA: Diagnosis not present

## 2023-01-21 DIAGNOSIS — Z79620 Long term (current) use of immunosuppressive biologic: Secondary | ICD-10-CM | POA: Diagnosis not present

## 2023-01-21 MED ORDER — SODIUM CHLORIDE 0.9 % IV SOLN
INTRAVENOUS | Status: AC
Start: 2023-01-21 — End: 2023-01-21

## 2023-01-21 NOTE — Telephone Encounter (Signed)
This nurse completed FMLA form for Laura [name] spouse,[name].  To collaborative pick up bin for provider review, signature and return to this nurse.  Noted provider out of office 01/16/2023  through 01/25/2023.

## 2023-01-21 NOTE — Patient Instructions (Signed)

## 2023-01-22 ENCOUNTER — Ambulatory Visit: Payer: BC Managed Care – PPO

## 2023-01-22 ENCOUNTER — Other Ambulatory Visit: Payer: BC Managed Care – PPO

## 2023-01-25 ENCOUNTER — Telehealth: Payer: Self-pay

## 2023-01-25 NOTE — Progress Notes (Addendum)
  Radiation Oncology         [phone] ________________________________  Name: Laura [name] MRN: [id number]  Date of Service: 01/25/2020  DOB: [birth year]  Post Treatment Telephone Note  Diagnosis:  C79.51 Secondary malignant neoplasm of bone (as documented in provider EOT note)  The patient was available for call today.  The patient did  note fatigue during radiation. The patient did not note skin changes in the field of radiation during therapy. The patient has noticed improvement in pain in the area(s) treated with radiation. The patient is not taking dexamethasone . The patient does have symptoms of  weakness and dehydration. The patient does not have symptoms of headache. The patient does not have symptoms of seizure or uncontrolled movement. The patient does not have symptoms of changes in vision. The patient does not have changes in speech. The patient does not have confusion.   The patient is scheduled for ongoing care with Dr. Tina in medical oncology. The patient was encouraged to call if she develops concerns or questions regarding radiation.   This concludes the interaction.  Rosaline Minerva, LPN

## 2023-01-25 NOTE — Telephone Encounter (Signed)
Called and spoke to husband. Report mildly increased in hydration yesterday. No abdominal pain or distention. Still not eating much and feeling weak. She sleeps a lot. She is able to take her cancer pills.   She is taking dilaudid about every 6 hours. No constipation.  Recommend labs and then see me tomorrow at 2pm.

## 2023-01-26 ENCOUNTER — Telehealth: Payer: Self-pay | Admitting: *Deleted

## 2023-01-26 ENCOUNTER — Other Ambulatory Visit (HOSPITAL_COMMUNITY): Payer: Self-pay

## 2023-01-26 ENCOUNTER — Inpatient Hospital Stay: Payer: BC Managed Care – PPO

## 2023-01-26 ENCOUNTER — Ambulatory Visit
Admission: RE | Admit: 2023-01-26 | Discharge: 2023-01-26 | Disposition: A | Discharge status: Home / Self Care | Payer: BC Managed Care – PPO | Source: Ambulatory Visit

## 2023-01-26 ENCOUNTER — Inpatient Hospital Stay (HOSPITAL_BASED_OUTPATIENT_CLINIC_OR_DEPARTMENT_OTHER): Payer: BC Managed Care – PPO

## 2023-01-26 VITALS — BP 104/72 | HR 102 | Temp 98.1°F | Resp 18 | Ht 63.0 in | Wt 125.3 lb

## 2023-01-26 DIAGNOSIS — R630 Anorexia: Secondary | ICD-10-CM

## 2023-01-26 DIAGNOSIS — C787 Secondary malignant neoplasm of liver and intrahepatic bile duct: Secondary | ICD-10-CM | POA: Diagnosis not present

## 2023-01-26 DIAGNOSIS — C7802 Secondary malignant neoplasm of left lung: Secondary | ICD-10-CM | POA: Diagnosis not present

## 2023-01-26 DIAGNOSIS — Z5112 Encounter for antineoplastic immunotherapy: Secondary | ICD-10-CM | POA: Diagnosis not present

## 2023-01-26 DIAGNOSIS — C7951 Secondary malignant neoplasm of bone: Secondary | ICD-10-CM | POA: Diagnosis not present

## 2023-01-26 DIAGNOSIS — G893 Neoplasm related pain (acute) (chronic): Secondary | ICD-10-CM

## 2023-01-26 DIAGNOSIS — Z79620 Long term (current) use of immunosuppressive biologic: Secondary | ICD-10-CM | POA: Diagnosis not present

## 2023-01-26 DIAGNOSIS — R110 Nausea: Secondary | ICD-10-CM

## 2023-01-26 DIAGNOSIS — Z515 Encounter for palliative care: Secondary | ICD-10-CM | POA: Diagnosis not present

## 2023-01-26 DIAGNOSIS — C7931 Secondary malignant neoplasm of brain: Secondary | ICD-10-CM

## 2023-01-26 DIAGNOSIS — C771 Secondary and unspecified malignant neoplasm of intrathoracic lymph nodes: Secondary | ICD-10-CM | POA: Diagnosis not present

## 2023-01-26 DIAGNOSIS — K7689 Other specified diseases of liver: Secondary | ICD-10-CM

## 2023-01-26 DIAGNOSIS — C7801 Secondary malignant neoplasm of right lung: Secondary | ICD-10-CM | POA: Diagnosis not present

## 2023-01-26 DIAGNOSIS — C4359 Malignant melanoma of other part of trunk: Secondary | ICD-10-CM | POA: Diagnosis not present

## 2023-01-26 LAB — COMPREHENSIVE METABOLIC PANEL
ALT: 29 U/L (ref 0–44)
AST: 77 U/L — ABNORMAL HIGH (ref 15–41)
Albumin: 2.5 g/dL — ABNORMAL LOW (ref 3.5–5.0)
Alkaline Phosphatase: 483 U/L — ABNORMAL HIGH (ref 38–126)
Anion gap: 1 — ABNORMAL LOW (ref 5–15)
BUN: 11 mg/dL (ref 6–20)
CO2: 39 mmol/L — ABNORMAL HIGH (ref 22–32)
Calcium: 7.6 mg/dL — ABNORMAL LOW (ref 8.9–10.3)
Chloride: 95 mmol/L — ABNORMAL LOW (ref 98–111)
Creatinine, Ser: 0.83 mg/dL (ref 0.44–1.00)
GFR, Estimated: >60 mL/min (ref >60)
Glucose, Bld: 122 mg/dL — ABNORMAL HIGH (ref 70–99)
Potassium: 2.7 mmol/L — CL (ref 3.5–5.1)
Sodium: 135 mmol/L (ref 135–145)
Total Bilirubin: 1.7 mg/dL — ABNORMAL HIGH (ref 0.0–1.2)
Total Protein: 5.1 g/dL — ABNORMAL LOW (ref 6.5–8.1)

## 2023-01-26 LAB — LACTATE DEHYDROGENASE: LDH: 2427 U/L — ABNORMAL HIGH (ref 98–192)

## 2023-01-26 MED ORDER — POTASSIUM CHLORIDE CRYS ER 10 MEQ PO TBCR
20.0000 meq | EXTENDED_RELEASE_TABLET | Freq: Two times a day (BID) | ORAL | 1 refills | Status: DC
  Filled 2023-01-26: qty 120, 30d supply, fill #0

## 2023-01-26 MED ORDER — DRONABINOL 10 MG PO CAPS
10.0000 mg | ORAL_CAPSULE | Freq: Two times a day (BID) | ORAL | 0 refills | Status: DC
  Filled 2023-01-26 (×2): qty 60, 30d supply, fill #0

## 2023-01-26 MED ORDER — PROCHLORPERAZINE MALEATE 5 MG PO TABS
5.0000 mg | ORAL_TABLET | Freq: Four times a day (QID) | ORAL | 1 refills | Status: DC | PRN
  Filled 2023-01-26: qty 60, 15d supply, fill #0
  Filled 2023-02-26: qty 60, 15d supply, fill #1

## 2023-01-26 MED ORDER — HYDROMORPHONE HCL 2 MG PO TABS
2.0000 mg | ORAL_TABLET | Freq: Four times a day (QID) | ORAL | 0 refills | Status: DC | PRN
  Filled 2023-01-26: qty 30, 8d supply, fill #0

## 2023-01-26 NOTE — Telephone Encounter (Signed)
 01/25/2023 Late entry: Olivia LITTIE Lever spouse Evalene FURNACE  form received today.  Successfully returned to [employer] fax no.#: [phone].  Copy to [facility] [employer] bin for items to be scanned.  Form to mail pick-up bin to be mailed to address on file. [address] [place] [id number] Process completed with no further instructions received or actions performed by this nurse.

## 2023-01-26 NOTE — Assessment & Plan Note (Addendum)
Will use dilaudid as needed. Refill sent later after 6PM Continue follow-up with palliative care

## 2023-01-26 NOTE — Assessment & Plan Note (Addendum)
Increase Marinol to 10 mg twice daily. New script sent

## 2023-01-26 NOTE — Assessment & Plan Note (Addendum)
Asymptomatic. Due to symptomatic liver and bone metastases started systemic treatment first right away.  Continue monitor for changes

## 2023-01-26 NOTE — Assessment & Plan Note (Signed)
 Secondary to diffuse liver metastases Likely will be limitation to her prognosis

## 2023-01-26 NOTE — Assessment & Plan Note (Addendum)
 Continue vemurafenib and cobimetinib CBC, CMP, Ldh next week with follow up

## 2023-01-26 NOTE — Progress Notes (Addendum)
 Patient Care Team: Katina Pfeiffer, [name] as PCP - General (Family Medicine) Pickenpack-Cousar, Fannie SAILOR, NP as Nurse Practitioner [facility])  Clinic Day:  01/26/2023  Referring physician: Katina Pfeiffer, [name]  ASSESSMENT & PLAN:   Assessment & Plan: 57 year-old female currently undergoing active treatment for stage IV melanoma.  She is following up with telephone visit today.   Diagnosis: 11/25/2022. Diffuse liver, bone, lung metastases. Brain metastases after starting systemic treatment Treatment: 12/11/2022. First line ipi/nivo. Completed 2 cycles.   Due to sign of clinical progression of liver metastases, new abdominal pain, loss of appetite, changed to BRAF/MEC inhibitors for disease control. Discussed potential side effects. Her insurance only authorized vemurafenib  and cobimetinib so she started on 12/14. Clinically stable. LFT improved today. Will repeat with clinical exam in a week.  Melanoma metastatic to liver (HCC) Continue vemurafenib  and cobimetinib CBC, CMP, Ldh next week with follow up  Metastatic cancer to brain [facility]) Asymptomatic. Due to symptomatic liver and bone metastases started systemic treatment first right away.  Continue monitor for changes  Cancer associated pain Will use dilaudid  as needed. Refill sent later after 6PM Continue follow-up with palliative care  Decreased appetite Increase Marinol  to 10 mg twice daily. New script sent  Liver dysfunction Secondary to diffuse liver metastases Likely will be limitation to her prognosis   Nausea Prescribed Compazine  5 mg as needed up to 4 times a day  The patient understands the plans discussed today and is in agreement with them.  She knows to contact our office if she develops concerns prior to her next appointment.  [name], MD  [facility] - A DEPT OF [facility] [address] [place]  [address] Dept: [phone] Dept Fax: [phone]   Orders Placed This Encounter  Procedures   CBC with Differential (Cancer Center Only)    Standing Status:   Future    Expiration Date:   01/26/2024   CMP (Cancer Center only)    Standing Status:   Future    Expiration Date:   01/26/2024   Lactate dehydrogenase    Standing Status:   Future    Expiration Date:   01/26/2024    Total time 43 minutes.  CHIEF COMPLAINT:  CC: melanoma  Current Treatment:  cobi/vemurafenib   INTERVAL HISTORY:  Allison is here today for repeat clinical assessment. She denies fevers or chills.  Report lower back pain.  She is taking Dilaudid  about 4 times a day.  No difficulty with urination, no confusion.  Lower extremity rash. She denies any abdominal pain.  Appetite is not great but eating more today.  Marinol  has been helpful and she is taking 10 mg twice a day. Reports some nausea but no vomiting.  Request new nausea medication. No new headaches, neurologic symptoms or visual changes.  I have reviewed the past medical history, past surgical history, social history and family history with the patient and they are unchanged from previous note.  ALLERGIES:  is allergic to aspirin.  MEDICATIONS:  Current Outpatient Medications  Medication Sig Dispense Refill   potassium chloride  (KLOR-CON  M) 10 MEQ tablet Take 2 tablets (20 mEq total) by mouth 2 (two) times daily. 120 tablet 1   prochlorperazine  (COMPAZINE ) 5 MG tablet Take 1 tablet (5 mg total) by mouth every 6 (six) hours as needed for nausea or vomiting. 60 tablet 1   AMBIEN  CR 6.25 MG CR tablet Take 6.25  mg by mouth at bedtime.     busPIRone  (BUSPAR ) 15 MG tablet Take 15 mg by mouth 2 (two) times daily.     COTELLIC  20 MG tablet TAKE 3 TABLETS BY MOUTH 1 TIME A DAY ON DAYS 1-21 EVERY 28 DAYS. AVOID SUN EXPOSURE. 63 tablet 0   dronabinol  (MARINOL ) 10 MG capsule Take 1 capsule (10 mg total) by mouth 2 (two) times daily. 60 capsule 0    famotidine -calcium  carbonate-magnesium  hydroxide (PEPCID  COMPLETE) 10-800-165 MG chewable tablet Chew 1 tablet by mouth in the morning.     HYDROmorphone  (DILAUDID ) 2 MG tablet Take 1 tablet (2 mg total) by mouth every 6 (six) hours as needed for severe pain (pain score 7-10). 30 tablet 0   LORazepam  (ATIVAN ) 0.5 MG tablet Take 1 tab 30 minutes before PET and 2 tabs before MRI (Patient taking differently: Take 0.5-1 mg by mouth See admin instructions. Take 0.5 mg (1 tablet) by mouth 30 minutes before PET scans and 1 mg (2 tablets) before MRI(s)) 30 tablet 0   metoprolol  tartrate (LOPRESSOR ) 25 MG tablet Take 1 tablet (25 mg total) by mouth 2 (two) times daily. 60 tablet 1   ondansetron  (ZOFRAN -ODT) 4 MG disintegrating tablet Dissolve 1 tablet (4 mg total) by mouth every 8 (eight) hours as needed for nausea or vomiting. 60 tablet 0   pantoprazole  (PROTONIX ) 40 MG tablet Take 1 tablet (40 mg total) by mouth daily. 30 tablet 2   pregabalin  (LYRICA ) 75 MG capsule Take 1 capsule (75 mg total) by mouth every morning.     rosuvastatin  (CRESTOR ) 10 MG tablet Take 10 mg by mouth at bedtime.     senna-docusate (SENOKOT-S) 8.6-50 MG tablet Take 1 tablet by mouth 2 (two) times daily.     ZELBORAF  240 MG tablet TAKE 4 TABLETS BY MOUTH EVERY 12 HOURS. TAKE WITH WATER . 224 tablet 0   No current facility-administered medications for this visit.    HISTORY OF PRESENT ILLNESS:   Oncology History  Melanoma metastatic to liver (HCC)  11/16/2022 Imaging   She presented to the emergency room on 11/16/2022 for back pain x 2 weeks and central chest pain for several months   CTA CAP IMPRESSION: 1. Negative for aortic dissection or aneurysm. 2. 1.2 cm left upper lobe nodule.  For 3. Multiple ill-defined liver lesions, suspicious for metastatic disease. 4. Multiple lytic osseous lesions, suspicious for metastatic disease. Pathologic fracture of the superior endplate of L5   11/25/2022 Pathology Results   1. Liver,  needle/core biopsy, Left hepatic lobe lesion :       - METASTATIC MELANOMA.    12/04/2022 Initial Diagnosis   Melanoma of skin (HCC) Liver, bone metastases. 1.2 cm LUL nodule   12/09/2022 PET scan   PET: Diffuse bone and liver metastases.  Lung metastases.    12/11/2022 -  Chemotherapy   Patient is on Treatment Plan : MELANOMA Nivolumab  (1) + Ipilimumab  (3) q21d / Nivolumab  (480) q28d     12/17/2022 Imaging   MRI brain 1. Three small right hemisphere brain metastases metastases ranging from 2 mm to 4 mm. Minimal hemosiderin. No associated cerebral edema or mass effect. No other metastatic disease identified in the brain.   2. Known osseous metastatic disease but no destructive lesion identified in the skull or visible cervical spine.   3. Moderate for age cerebral white matter changes most commonly due to small vessel disease.       REVIEW OF SYSTEMS:   All relevant systems  were reviewed with the patient and are negative.   VITALS:  Blood pressure 104/72, pulse (!) 102, temperature 98.1 F (36.7 C), temperature source Temporal, resp. rate 18, height 5' 3 (1.6 m), weight 125 lb 4.8 oz (56.8 kg), SpO2 98%.  Wt Readings from Last 3 Encounters:  01/26/23 125 lb 4.8 oz (56.8 kg)  01/15/23 136 lb 2 oz (61.7 kg)  01/11/23 136 lb 8 oz (61.9 kg)    Body mass index is 22.2 kg/m.  Performance status (ECOG): 3 - Symptomatic, >50% confined to bed  PHYSICAL EXAM:   GENERAL:alert, no distress and comfortable SKIN: Bilateral lower extremity rash.  No jaundice EYES: normal, sclera clear LYMPH:  no palpable cervical lymphadenopathy LUNGS: clear to auscultation with normal breathing effort.  No wheeze or rales HEART: regular rate & rhythm and no murmurs and no lower extremity edema ABDOMEN: abdomen soft, non-tender and nondistended Musculoskeletal: Bilateral pitting lower extremity edema NEURO: alert and oriented x 3, fluent speech, no focal motor/sensory deficits.  Strength and  sensation equal bilaterally.  LABORATORY DATA:  I have reviewed the data as listed    Component Value Date/Time   NA 135 01/26/2023 1340   K 2.7 (LL) 01/26/2023 1340   CL 95 (L) 01/26/2023 1340   CO2 39 (H) 01/26/2023 1340   GLUCOSE 122 (H) 01/26/2023 1340   BUN 11 01/26/2023 1340   CREATININE 0.83 01/26/2023 1340   CREATININE 0.89 01/13/2023 1443   CALCIUM  7.6 (L) 01/26/2023 1340   PROT 5.1 (L) 01/26/2023 1340   ALBUMIN 2.5 (L) 01/26/2023 1340   AST 77 (H) 01/26/2023 1340   AST 203 (HH) 01/13/2023 1443   ALT 29 01/26/2023 1340   ALT 92 (H) 01/13/2023 1443   ALKPHOS 483 (H) 01/26/2023 1340   BILITOT 1.7 (H) 01/26/2023 1340   BILITOT 1.5 (H) 01/13/2023 1443   GFRNONAA >60 01/26/2023 1340   GFRNONAA >60 01/13/2023 1443   GFRAA >60 04/20/2018 0823    No results found for: SPEP, UPEP  Lab Results  Component Value Date   WBC 8.9 01/11/2023   NEUTROABS 6.4 01/11/2023   HGB 10.6 (L) 01/11/2023   HCT 31.8 (L) 01/11/2023   MCV 92.7 01/11/2023   PLT 340 01/11/2023      Chemistry      Component Value Date/Time   NA 135 01/26/2023 1340   K 2.7 (LL) 01/26/2023 1340   CL 95 (L) 01/26/2023 1340   CO2 39 (H) 01/26/2023 1340   BUN 11 01/26/2023 1340   CREATININE 0.83 01/26/2023 1340   CREATININE 0.89 01/13/2023 1443      Component Value Date/Time   CALCIUM  7.6 (L) 01/26/2023 1340   ALKPHOS 483 (H) 01/26/2023 1340   AST 77 (H) 01/26/2023 1340   AST 203 (HH) 01/13/2023 1443   ALT 29 01/26/2023 1340   ALT 92 (H) 01/13/2023 1443   BILITOT 1.7 (H) 01/26/2023 1340   BILITOT 1.5 (H) 01/13/2023 1443       RADIOGRAPHIC STUDIES: I have personally reviewed the radiological images as listed and agreed with the findings in the report. DG Chest 2 View Result Date: 01/03/2023 CLINICAL DATA:  Sepsis EXAM: CHEST - 2 VIEW COMPARISON:  12/27/2022 FINDINGS: The heart size and mediastinal contours are within normal limits. Trace pleural effusions. Unchanged left upper lobe lung  nodule. The visualized skeletal structures are unremarkable. IMPRESSION: 1. Trace pleural effusions. No focal airspace opacity. 2.  Unchanged left upper lobe lung nodule. Electronically Signed   By: Alex D  Marlyce M.D.   On: 01/03/2023 15:59   CT Angio Chest PE W and/or Wo Contrast Result Date: 12/28/2022 CLINICAL DATA:  Patient with metastatic melanoma, new transaminitis, pulmonary embolism suspected. EXAM: CT ANGIOGRAPHY CHEST CT ABDOMEN AND PELVIS WITH CONTRAST TECHNIQUE: Multidetector CT imaging of the chest was performed using the standard protocol during bolus administration of intravenous contrast. Multiplanar CT image reconstructions and MIPs were obtained to evaluate the vascular anatomy. Multidetector CT imaging of the abdomen and pelvis was performed using the standard protocol during bolus administration of intravenous contrast. RADIATION DOSE REDUCTION: This exam was performed according to the departmental dose-optimization program which includes automated exposure control, adjustment of the mA and/or kV according to patient size and/or use of iterative reconstruction technique. CONTRAST:  OMNIPAQUE  IOHEXOL  350 MG/ML SOLN COMPARISON:  PET-CT recently 12/09/2022, and CTA chest, abdomen and pelvis 11/16/2022. FINDINGS: CTA CHEST FINDINGS Cardiovascular: The cardiac size is normal. No arterial dilatation or embolism is seen. Minimal pericardial effusion anteriorly. Scattered calcific plaques lad coronary artery. Mild aortic atherosclerosis. There is no aneurysm, stenosis or dissection. The great vessels are widely patent. Pulmonary veins are nondistended. Mediastinum/Nodes: Enlarged superior left hilar lymph node is 1.3 cm in short axis, previously 1.1 cm. No other intrathoracic adenopathy is seen. 1.2 cm hypodense nodule in the inferior pole of the left lobe of the thyroid  gland is unchanged. No follow-up imaging is recommended. Lungs/Pleura: 1.5 cm pulmonary metastasis left upper lobe, level of  the aortic arch, unchanged from the PET-CT, 11/16/2022 was 1.2 cm. There are several new nodules in the right middle lobe, most are 3 mm or smaller for example on series 13 axial images 80 6-99. Largest of these is 5 mm anteriorly on 13:90, concerning for new metastases. In the right lower lobe there is a new 5 mm nodule about the fissure on 13:90. Mild paraseptal emphysema again seen in the lung apices. There are coarse atelectatic bands in the posterior bases. On the left there is increased conspicuity of fissural nodules in the lower lung field, largest of these is about 3 mm. There is a new nodule in the left lower lobe measuring 5 mm on 13:99 and a new 3 mm apical left lung nodule on 13:44. There is no pleural effusion, no other new lung abnormality. No focal consolidation. Musculoskeletal: There is chronic interbody fusion hardware in the lower cervical spine. Multifocal lytic metastatic disease particularly in the thoracic spine but also in the ribs and sternum, with pathologic compression fracture of the T3 vertebral body, worsening lytic metastasis T4 body with extension into the spinal canal and increased erosion through the posterior cortex, with increased soft tissue infiltration into the left T3-4 foramen. Similar increased posterior cortical erosion and metastatic extension into the spinal canal at T8. Mass effect on the cord could be present at these levels. Review of the MIP images confirms the above findings. CT ABDOMEN and PELVIS FINDINGS Hepatobiliary: Innumerable diffuse metastases to the liver largest is in segment 2, today 7.5 cm, on the PET-CT 7.4 cm, the next largest is medially in segment 5 measuring 3.9 cm, previously 3.3 cm. Gallbladder is absent without bile duct dilatation. Pancreas: No abnormality. Spleen: New scattered nonspecific subcentimeter hypodensities are noted in the inferior aspect of the spleen, possible metastases. Largest is 8 mm on 19:24. Adrenals/Urinary Tract: Stable 1  cm left adrenal nodule, was not hypermetabolic on PET-CT. No right adrenal or broad or renal mass. No urinary stone or obstruction. Unremarkable bladder for the degree of  distention. Stomach/Bowel: Unremarkable contracted stomach. Normal caliber unopacified small bowel. Normal appendix. Moderate fecal stasis ascending and transverse colon. Distal descending colon and sigmoid either demonstrating mild wall thickening or underdistention. Unremarkable rectum. Vascular/Lymphatic: Aortic atherosclerosis. No enlarged abdominal or pelvic lymph nodes. Reproductive: Status post hysterectomy. No adnexal masses. Multiple pelvic phleboliths. Other: No ascites or free air, no incarcerated hernia. Musculoskeletal: Progressive lytic metastatic disease, including compared with 12/09/2022, is noted at multiple levels of the lumbar spine, throughout the sacrum and bony pelvis, and with multiple small lytic lesions in both proximal femurs. There is increased pathologic compression fracture of the L5 vertebral body with soft tissue herniation into the spinal canal severely narrowing the spinal canal from the L4-5 to the L5-S1 level. There is an enhancing mass in the right dorsal paraspinous musculature at L2-3 measuring 2.3 cm and was seen on 12/09/2022. Destructive mass of S3 to the left today measures 3 cm, previously 2.6 cm. Review of the MIP images confirms the above findings. IMPRESSION: 1. No evidence of arterial dilatation or embolism. 2. 1.5 cm pulmonary metastasis left upper lobe, unchanged from the PET-CT, 1.2 cm on 11/16/2022. 3. Multiple new nodules in the right middle lobe, right lower lobe, and left apical lung, largest 5 mm, concerning for new metastases. 4. Increased conspicuity of fissural nodules in the left lower lung field. 5. Increased size of a left hilar lymph node. 6. Innumerable diffuse metastases to the liver, largest 7.5 cm in segment 2, previously 7.4 cm. 7. New scattered subcentimeter hypodensities in the  inferior spleen, possible metastases. 8. Progressive lytic metastatic disease throughout the spine, sacrum, bony pelvis, and proximal femurs. 9. Increased pathologic compression fracture of the T3 and L5 vertebral body with soft tissue herniation into the spinal canal severely narrowing the spinal canal from the L4-5 to the L5-S1 level, and in the thoracic spine with increased soft tissue infiltration of the spinal canal at T4 and T8, and in the left T3-4 foramen. 10. Stable 2.3 cm enhancing mass in the right dorsal paraspinous musculature at L2-3. 11. Constipation. 12. Distal descending colon and sigmoid either demonstrating mild wall thickening or underdistention. Correlate clinically for underlying colitis. 13. Aortic and coronary artery atherosclerosis. Aortic Atherosclerosis (ICD10-I70.0) and Emphysema (ICD10-J43.9). Electronically Signed   By: Francis Quam M.D.   On: 12/28/2022 02:39   CT ABDOMEN PELVIS W CONTRAST Result Date: 12/28/2022 CLINICAL DATA:  Patient with metastatic melanoma, new transaminitis, pulmonary embolism suspected. EXAM: CT ANGIOGRAPHY CHEST CT ABDOMEN AND PELVIS WITH CONTRAST TECHNIQUE: Multidetector CT imaging of the chest was performed using the standard protocol during bolus administration of intravenous contrast. Multiplanar CT image reconstructions and MIPs were obtained to evaluate the vascular anatomy. Multidetector CT imaging of the abdomen and pelvis was performed using the standard protocol during bolus administration of intravenous contrast. RADIATION DOSE REDUCTION: This exam was performed according to the departmental dose-optimization program which includes automated exposure control, adjustment of the mA and/or kV according to patient size and/or use of iterative reconstruction technique. CONTRAST:  OMNIPAQUE  IOHEXOL  350 MG/ML SOLN COMPARISON:  PET-CT recently 12/09/2022, and CTA chest, abdomen and pelvis 11/16/2022. FINDINGS: CTA CHEST FINDINGS Cardiovascular:  The cardiac size is normal. No arterial dilatation or embolism is seen. Minimal pericardial effusion anteriorly. Scattered calcific plaques lad coronary artery. Mild aortic atherosclerosis. There is no aneurysm, stenosis or dissection. The great vessels are widely patent. Pulmonary veins are nondistended. Mediastinum/Nodes: Enlarged superior left hilar lymph node is 1.3 cm in short axis, previously 1.1 cm.  No other intrathoracic adenopathy is seen. 1.2 cm hypodense nodule in the inferior pole of the left lobe of the thyroid  gland is unchanged. No follow-up imaging is recommended. Lungs/Pleura: 1.5 cm pulmonary metastasis left upper lobe, level of the aortic arch, unchanged from the PET-CT, 11/16/2022 was 1.2 cm. There are several new nodules in the right middle lobe, most are 3 mm or smaller for example on series 13 axial images 80 6-99. Largest of these is 5 mm anteriorly on 13:90, concerning for new metastases. In the right lower lobe there is a new 5 mm nodule about the fissure on 13:90. Mild paraseptal emphysema again seen in the lung apices. There are coarse atelectatic bands in the posterior bases. On the left there is increased conspicuity of fissural nodules in the lower lung field, largest of these is about 3 mm. There is a new nodule in the left lower lobe measuring 5 mm on 13:99 and a new 3 mm apical left lung nodule on 13:44. There is no pleural effusion, no other new lung abnormality. No focal consolidation. Musculoskeletal: There is chronic interbody fusion hardware in the lower cervical spine. Multifocal lytic metastatic disease particularly in the thoracic spine but also in the ribs and sternum, with pathologic compression fracture of the T3 vertebral body, worsening lytic metastasis T4 body with extension into the spinal canal and increased erosion through the posterior cortex, with increased soft tissue infiltration into the left T3-4 foramen. Similar increased posterior cortical erosion and  metastatic extension into the spinal canal at T8. Mass effect on the cord could be present at these levels. Review of the MIP images confirms the above findings. CT ABDOMEN and PELVIS FINDINGS Hepatobiliary: Innumerable diffuse metastases to the liver largest is in segment 2, today 7.5 cm, on the PET-CT 7.4 cm, the next largest is medially in segment 5 measuring 3.9 cm, previously 3.3 cm. Gallbladder is absent without bile duct dilatation. Pancreas: No abnormality. Spleen: New scattered nonspecific subcentimeter hypodensities are noted in the inferior aspect of the spleen, possible metastases. Largest is 8 mm on 19:24. Adrenals/Urinary Tract: Stable 1 cm left adrenal nodule, was not hypermetabolic on PET-CT. No right adrenal or broad or renal mass. No urinary stone or obstruction. Unremarkable bladder for the degree of distention. Stomach/Bowel: Unremarkable contracted stomach. Normal caliber unopacified small bowel. Normal appendix. Moderate fecal stasis ascending and transverse colon. Distal descending colon and sigmoid either demonstrating mild wall thickening or underdistention. Unremarkable rectum. Vascular/Lymphatic: Aortic atherosclerosis. No enlarged abdominal or pelvic lymph nodes. Reproductive: Status post hysterectomy. No adnexal masses. Multiple pelvic phleboliths. Other: No ascites or free air, no incarcerated hernia. Musculoskeletal: Progressive lytic metastatic disease, including compared with 12/09/2022, is noted at multiple levels of the lumbar spine, throughout the sacrum and bony pelvis, and with multiple small lytic lesions in both proximal femurs. There is increased pathologic compression fracture of the L5 vertebral body with soft tissue herniation into the spinal canal severely narrowing the spinal canal from the L4-5 to the L5-S1 level. There is an enhancing mass in the right dorsal paraspinous musculature at L2-3 measuring 2.3 cm and was seen on 12/09/2022. Destructive mass of S3 to the left  today measures 3 cm, previously 2.6 cm. Review of the MIP images confirms the above findings. IMPRESSION: 1. No evidence of arterial dilatation or embolism. 2. 1.5 cm pulmonary metastasis left upper lobe, unchanged from the PET-CT, 1.2 cm on 11/16/2022. 3. Multiple new nodules in the right middle lobe, right lower lobe, and left apical  lung, largest 5 mm, concerning for new metastases. 4. Increased conspicuity of fissural nodules in the left lower lung field. 5. Increased size of a left hilar lymph node. 6. Innumerable diffuse metastases to the liver, largest 7.5 cm in segment 2, previously 7.4 cm. 7. New scattered subcentimeter hypodensities in the inferior spleen, possible metastases. 8. Progressive lytic metastatic disease throughout the spine, sacrum, bony pelvis, and proximal femurs. 9. Increased pathologic compression fracture of the T3 and L5 vertebral body with soft tissue herniation into the spinal canal severely narrowing the spinal canal from the L4-5 to the L5-S1 level, and in the thoracic spine with increased soft tissue infiltration of the spinal canal at T4 and T8, and in the left T3-4 foramen. 10. Stable 2.3 cm enhancing mass in the right dorsal paraspinous musculature at L2-3. 11. Constipation. 12. Distal descending colon and sigmoid either demonstrating mild wall thickening or underdistention. Correlate clinically for underlying colitis. 13. Aortic and coronary artery atherosclerosis. Aortic Atherosclerosis (ICD10-I70.0) and Emphysema (ICD10-J43.9). Electronically Signed   By: Francis Quam M.D.   On: 12/28/2022 02:39   CT HEAD WO CONTRAST ( ) Result Date: 12/27/2022 CLINICAL DATA:  Delirium.  History of brain metastases. EXAM: CT HEAD WITHOUT CONTRAST TECHNIQUE: Contiguous axial images were obtained from the base of the skull through the vertex without intravenous contrast. RADIATION DOSE REDUCTION: This exam was performed according to the departmental dose-optimization program which  includes automated exposure control, adjustment of the mA and/or kV according to patient size and/or use of iterative reconstruction technique. COMPARISON:  Head CT 11/11/2022.  MRI brain 12/17/2022. FINDINGS: Brain: No evidence of acute infarction, hemorrhage, hydrocephalus, or extra-axial fluid collection. There are 2 rounded hyperdense metastatic lesions in the right frontal cortex seen best on coronal imaging. These are unchanged from prior MRI. There is also 2 mm hyperdense metastatic lesion in the right occipital region best seen on coronal image 5/46. This is not well appreciated on prior MRI. There is no mass effect. Minimal patchy periventricular white matter hypodensity appears similar to prior. Vascular: No hyperdense vessel or unexpected calcification. Skull: Normal. Negative for fracture or focal lesion. Sinuses/Orbits: No acute finding. Other: None. IMPRESSION: 1. No acute intracranial process. 2. Two stable rounded hyperdense metastatic lesions in the right frontal cortex. There is also a 2 mm hyperdense metastatic lesion in the right occipital region which is not well appreciated on prior MRI. No mass effect. Electronically Signed   By: Greig Pique M.D.   On: 12/27/2022 23:20   DG Chest Port 1 View Result Date: 12/27/2022 CLINICAL DATA:  Questionable sepsis - evaluate for abnormality EXAM: PORTABLE CHEST 1 VIEW COMPARISON:  Chest x-ray 11/16/2022, CT angio chest 11/16/2022, PET CT 12/09/2022 FINDINGS: The heart and mediastinal contours are within normal limits. Aortic calcification. Redemonstration of an approximate 1.6 cm left upper quadrant pulmonary better evaluated on PET-CT 12/09/2022. No focal consolidation. No pulmonary edema. No pleural effusion. No pneumothorax. No acute osseous abnormality.  Cervical spine surgical hardware. IMPRESSION: 1. No acute cardiopulmonary abnormality. 2. Redemonstration of an approximate 1.6 cm left upper quadrant pulmonary better evaluated on PET-CT  12/09/2022. 3.  Aortic Atherosclerosis (ICD10-I70.0). Electronically Signed   By: Morgane  Naveau M.D.   On: 12/27/2022 23:05

## 2023-01-26 NOTE — Telephone Encounter (Signed)
 CRITICAL VALUE STICKER  CRITICAL VALUE: Potassium 2.7  RECEIVER (on-site recipient of call):Sandi K, RN  DATE & TIME NOTIFIED: 01/26/23; 1432  MESSENGER (representative from lab):Elizabeth  MD NOTIFIED: Dr. Judie NOVAK, RN  TIME OF NOTIFICATION:1435  RESPONSE: Information acknowledged

## 2023-01-28 ENCOUNTER — Other Ambulatory Visit (HOSPITAL_COMMUNITY): Payer: Self-pay

## 2023-01-28 ENCOUNTER — Other Ambulatory Visit: Payer: Self-pay

## 2023-01-28 ENCOUNTER — Other Ambulatory Visit (HOSPITAL_BASED_OUTPATIENT_CLINIC_OR_DEPARTMENT_OTHER): Payer: Self-pay

## 2023-01-28 NOTE — Progress Notes (Signed)
 Dronabinol 10 mg Capsules is authorized through 07/27/2023. Pharmacy notified. No other needs or concerns noted at this time.

## 2023-01-29 ENCOUNTER — Other Ambulatory Visit (HOSPITAL_COMMUNITY): Payer: Self-pay

## 2023-01-31 DIAGNOSIS — R0602 Shortness of breath: Secondary | ICD-10-CM | POA: Diagnosis not present

## 2023-01-31 DIAGNOSIS — C7931 Secondary malignant neoplasm of brain: Secondary | ICD-10-CM | POA: Diagnosis not present

## 2023-01-31 DIAGNOSIS — C439 Malignant melanoma of skin, unspecified: Secondary | ICD-10-CM | POA: Diagnosis not present

## 2023-02-01 ENCOUNTER — Inpatient Hospital Stay: Payer: BC Managed Care – PPO | Attending: Physician Assistant

## 2023-02-01 VITALS — BP 87/60 | HR 105 | Temp 97.6°F | Resp 18

## 2023-02-01 DIAGNOSIS — F5089 Other specified eating disorder: Secondary | ICD-10-CM | POA: Insufficient documentation

## 2023-02-01 DIAGNOSIS — R1013 Epigastric pain: Secondary | ICD-10-CM | POA: Diagnosis not present

## 2023-02-01 DIAGNOSIS — Z79899 Other long term (current) drug therapy: Secondary | ICD-10-CM | POA: Diagnosis not present

## 2023-02-01 DIAGNOSIS — E876 Hypokalemia: Secondary | ICD-10-CM | POA: Diagnosis not present

## 2023-02-01 DIAGNOSIS — C7951 Secondary malignant neoplasm of bone: Secondary | ICD-10-CM | POA: Diagnosis not present

## 2023-02-01 DIAGNOSIS — C7800 Secondary malignant neoplasm of unspecified lung: Secondary | ICD-10-CM | POA: Diagnosis not present

## 2023-02-01 DIAGNOSIS — Z79891 Long term (current) use of opiate analgesic: Secondary | ICD-10-CM | POA: Diagnosis not present

## 2023-02-01 DIAGNOSIS — Z87891 Personal history of nicotine dependence: Secondary | ICD-10-CM | POA: Diagnosis not present

## 2023-02-01 DIAGNOSIS — E860 Dehydration: Secondary | ICD-10-CM

## 2023-02-01 DIAGNOSIS — Z9189 Other specified personal risk factors, not elsewhere classified: Secondary | ICD-10-CM | POA: Insufficient documentation

## 2023-02-01 DIAGNOSIS — R634 Abnormal weight loss: Secondary | ICD-10-CM | POA: Diagnosis not present

## 2023-02-01 DIAGNOSIS — C787 Secondary malignant neoplasm of liver and intrahepatic bile duct: Secondary | ICD-10-CM | POA: Diagnosis not present

## 2023-02-01 DIAGNOSIS — I959 Hypotension, unspecified: Secondary | ICD-10-CM | POA: Diagnosis not present

## 2023-02-01 DIAGNOSIS — C4359 Malignant melanoma of other part of trunk: Secondary | ICD-10-CM | POA: Diagnosis not present

## 2023-02-01 DIAGNOSIS — M545 Low back pain, unspecified: Secondary | ICD-10-CM | POA: Insufficient documentation

## 2023-02-01 DIAGNOSIS — C7931 Secondary malignant neoplasm of brain: Secondary | ICD-10-CM | POA: Insufficient documentation

## 2023-02-01 DIAGNOSIS — G893 Neoplasm related pain (acute) (chronic): Secondary | ICD-10-CM | POA: Insufficient documentation

## 2023-02-01 MED ORDER — SODIUM CHLORIDE 0.9 % IV SOLN
INTRAVENOUS | Status: AC
Start: 2023-02-01 — End: 2023-02-01

## 2023-02-01 MED ORDER — SODIUM CHLORIDE 0.9 % IV SOLN
Freq: Once | INTRAVENOUS | Status: DC
Start: 2023-02-01 — End: 2023-02-01

## 2023-02-01 MED ORDER — HEPARIN SOD (PORK) LOCK FLUSH 100 UNIT/ML IV SOLN: 500.0000 [IU] | Freq: Once | INTRAVENOUS | Status: DC

## 2023-02-01 NOTE — Assessment & Plan Note (Addendum)
 Continue Marinol 10 mg twice daily. Prescription sent to [facility] at [facility] for daily IV fluid in the infusion room this week and next week as needed.

## 2023-02-01 NOTE — Assessment & Plan Note (Signed)
 EKG on 12/30 and then montly x3 months  Monitor for diarrhea, nausea, rash, MSK pain, visual changes Monitor LFT Monitor for irAEs

## 2023-02-01 NOTE — Assessment & Plan Note (Addendum)
 LFT improved. Continue monitor. Continue vemurafenib 960 mg twice daily, and cobimetinib 60 mg once daily. Prescribed cycle 2. CBC, CMP, Ldh on 1/24 with follow up Repeat PET ordered

## 2023-02-01 NOTE — Telephone Encounter (Signed)
 Called and patient spouse Jorja Loa), IVF needed urgent per nurse Tammi... Patient scheduled for appt on 1/6 @1100 

## 2023-02-01 NOTE — Progress Notes (Signed)
 Patient Care Team: Laura Pfeiffer, [name] as PCP - General (Family Medicine) Pickenpack-Cousar, Laura SAILOR, NP as Nurse Practitioner [facility] Palliative Medicine)  Clinic Day:  02/02/2023  Referring physician: Katina Pfeiffer, [name]  ASSESSMENT & PLAN:   Assessment & Plan: 58 y.o.female currently undergoing active treatment for stage IV melanoma.  She is following up with telephone visit today.   Diagnosis: 11/25/2022. Diffuse liver, bone, lung metastases. Brain metastases after starting systemic treatment Treatment: 12/11/2022. First line ipi/nivo. Completed 2 cycles. 12/14/22-12/28/22 radiation to L-spine 30Gy/59fx 12/14  vemurafenib  and cobimetinib due to disease uncontrolled or worsening symptoms, liver metastases   Due to sign of clinical progression of liver metastases, new abdominal pain, loss of appetite, changed to BRAF/MEC inhibitors for disease control. Discussed potential side effects. Her insurance only authorized vemurafenib  and cobimetinib so she started on 12/14. Clinically stable. LFT improved again today.  Discussed with patient and husband, she is having response but not completely yet.  I recommend proceed with cycle 2 of targeted therapy before we consider switching back to immunotherapy.  Will repeat PET in about 1-2 weeks with clinical exam in 2-3 weeks.  Will need monitor closely for liver failure with labs and clinical exam.  Melanoma metastatic to liver [facility]) LFT improved. Continue monitor. Continue vemurafenib  960 mg twice daily, and cobimetinib 60 mg once daily. Prescribed cycle 2. CBC, CMP, Ldh on 1/24 with follow up Repeat PET ordered  At risk for side effect of medication EKG on 12/30 and then montly x3 months  Monitor for diarrhea, nausea, rash, MSK pain, visual changes Monitor LFT Monitor for irAEs  Cancer associated pain Will start gabapentin  100 mg nightly tonight Hold on ambien . Continue follow-up with palliative care  Decreased oral  intake Continue Marinol  10 mg twice daily. Prescription sent to [facility] at [facility] for daily IV fluid in the infusion room this week and next week as needed.  Hypotension Hold metoprolol  for now.  Hypokalemia Continue potassium 20 mEq twice daily  Metastatic cancer to brain [facility]) Asymptomatic  Plan for MRI in Feb   Follow-up 1/24.  Lab before visit The patient understands the plans discussed today and is in agreement with them.  She knows to contact our office if she develops concerns prior to her next appointment.  Laura JAYSON Chihuahua, MD  [facility] - A DEPT OF [facility] [address] [place] [address] Dept: [phone] Dept Fax: [phone]   Orders Placed This Encounter  Procedures   NM PET Image Restage (PS) Whole Body    Standing Status:   Future    Expected Date:   02/15/2023    Expiration Date:   02/02/2024    If indicated for the ordered procedure, I authorize the administration of a radiopharmaceutical per Radiology protocol:   Yes    Is the patient pregnant?:   No    Preferred imaging location?:   [facility]   CBC with Differential (Cancer Center Only)    Standing Status:   Future    Expiration Date:   02/02/2024   CMP (Cancer Center only)    Standing Status:   Future    Expiration Date:   02/02/2024   Lactate dehydrogenase    Standing Status:   Future    Expiration Date:   02/02/2024      CHIEF COMPLAINT:  CC: stage IV melanoma  INTERVAL HISTORY:  Laura [name] is here  today for repeat clinical assessment. She denies fevers but chills  She feels hungry. But feels full easily. Report nausea. Zofran  did not work. She had diarrhea. No stomach cramp or pain distention. No blood.  Pain is worse sitting on chair and better lying down.  She was using dilaudid  but husband reports cause confusion.  I have reviewed the past medical history, past surgical history,  social history and family history with the patient and they are unchanged from previous note.  ALLERGIES:  is allergic to aspirin.  MEDICATIONS:  Current Outpatient Medications  Medication Sig Dispense Refill   gabapentin  (NEURONTIN ) 100 MG capsule Take 1 capsule (100 mg total) by mouth 2 (two) times daily. 60 capsule 0   AMBIEN  CR 6.25 MG CR tablet Take 6.25 mg by mouth at bedtime.     busPIRone  (BUSPAR ) 15 MG tablet Take 15 mg by mouth 2 (two) times daily.     cobimetinib fumarate  (COTELLIC ) 20 MG tablet Take 3 tablets (60 mg total) by mouth daily. Take on days 1-21. Repeat every 28 days. Avoid sun exposure. 63 tablet 0   dronabinol  (MARINOL ) 10 MG capsule Take 1 capsule (10 mg total) by mouth 2 (two) times daily. 60 capsule 0   famotidine -calcium  carbonate-magnesium  hydroxide (PEPCID  COMPLETE) 10-800-165 MG chewable tablet Chew 1 tablet by mouth in the morning.     HYDROmorphone  (DILAUDID ) 2 MG tablet Take 1 tablet (2 mg total) by mouth every 6 (six) hours as needed for severe pain (pain score 7-10). 30 tablet 0   metoprolol  tartrate (LOPRESSOR ) 25 MG tablet Take 1 tablet (25 mg total) by mouth 2 (two) times daily. 60 tablet 1   ondansetron  (ZOFRAN -ODT) 4 MG disintegrating tablet Dissolve 1 tablet (4 mg total) by mouth every 8 (eight) hours as needed for nausea or vomiting. 60 tablet 0   pantoprazole  (PROTONIX ) 40 MG tablet Take 1 tablet (40 mg total) by mouth daily. 30 tablet 2   potassium chloride  (KLOR-CON  M) 10 MEQ tablet Take 2 tablets (20 mEq total) by mouth 2 (two) times daily. 120 tablet 1   prochlorperazine  (COMPAZINE ) 5 MG tablet Take 1 tablet (5 mg total) by mouth every 6 (six) hours as needed for nausea or vomiting. 60 tablet 1   rosuvastatin  (CRESTOR ) 10 MG tablet Take 10 mg by mouth at bedtime.     vemurafenib  (ZELBORAF ) 240 MG tablet Take 4 tablets (960 mg total) by mouth every 12 (twelve) hours. Take with water . 224 tablet 0   No current facility-administered medications for  this visit.    HISTORY OF PRESENT ILLNESS:   Oncology History  Melanoma metastatic to liver (HCC)  11/16/2022 Imaging   She presented to the emergency room on 11/16/2022 for back pain x 2 weeks and central chest pain for several months   CTA CAP IMPRESSION: 1. Negative for aortic dissection or aneurysm. 2. 1.2 cm left upper lobe nodule.  For 3. Multiple ill-defined liver lesions, suspicious for metastatic disease. 4. Multiple lytic osseous lesions, suspicious for metastatic disease. Pathologic fracture of the superior endplate of L5   11/25/2022 Pathology Results   1. Liver, needle/core biopsy, Left hepatic lobe lesion :       - METASTATIC MELANOMA.    12/04/2022 Initial Diagnosis   Melanoma of skin (HCC) Liver, bone metastases. 1.2 cm LUL nodule   12/09/2022 PET scan   PET: Diffuse bone and liver metastases.  Lung metastases.    12/11/2022 -  Chemotherapy   Patient is on Treatment Plan :  MELANOMA Nivolumab  (1) + Ipilimumab  (3) q21d / Nivolumab  (480) q28d     12/17/2022 Imaging   MRI brain 1. Three small right hemisphere brain metastases metastases ranging from 2 mm to 4 mm. Minimal hemosiderin. No associated cerebral edema or mass effect. No other metastatic disease identified in the brain.   2. Known osseous metastatic disease but no destructive lesion identified in the skull or visible cervical spine.   3. Moderate for age cerebral white matter changes most commonly due to small vessel disease.       REVIEW OF SYSTEMS:   All relevant systems were reviewed with the patient and are negative.   VITALS:  Blood pressure 109/73, pulse (!) 109, temperature (!) 97.3 F (36.3 C), temperature source Temporal, resp. rate 17, weight 124 lb 14.4 oz (56.7 kg), SpO2 100%.  Wt Readings from Last 3 Encounters:  02/02/23 124 lb 14.4 oz (56.7 kg)  01/26/23 125 lb 4.8 oz (56.8 kg)  01/15/23 136 lb 2 oz (61.7 kg)    Body mass index is 22.13 kg/m.  Performance status (ECOG):  3 - Symptomatic, >50% confined to bed  PHYSICAL EXAM:   GENERAL:alert, no distress and on wheelchair SKIN: skin color normal, rashes resolved EYES: normal, sclera clear LUNGS: clear to auscultation with normal breathing effort.  No wheeze or rales HEART: regular rate & rhythm and no murmurs and no lower extremity edema ABDOMEN: abdomen soft, non-tender and nondistended Musculoskeletal: lower extremity edema NEURO: alert, fluent speech, no focal motor/sensory deficits.  LABORATORY DATA:  I have reviewed the data as listed    Component Value Date/Time   NA 137 02/02/2023 1100   K 4.4 02/02/2023 1100   CL 107 02/02/2023 1100   CO2 27 02/02/2023 1100   GLUCOSE 98 02/02/2023 1100   BUN 9 02/02/2023 1100   CREATININE 0.99 02/02/2023 1100   CALCIUM  7.7 (L) 02/02/2023 1100   PROT 4.9 (L) 02/02/2023 1100   ALBUMIN 2.3 (L) 02/02/2023 1100   AST 48 (H) 02/02/2023 1100   ALT 21 02/02/2023 1100   ALKPHOS 363 (H) 02/02/2023 1100   BILITOT 1.2 02/02/2023 1100   GFRNONAA >60 02/02/2023 1100   GFRAA >60 04/20/2018 0823    No results found for: SPEP, UPEP  Lab Results  Component Value Date   WBC 5.3 02/02/2023   NEUTROABS 3.8 02/02/2023   HGB 8.9 (L) 02/02/2023   HCT 26.7 (L) 02/02/2023   MCV 93.7 02/02/2023   PLT 297 02/02/2023      Chemistry      Component Value Date/Time   NA 137 02/02/2023 1100   K 4.4 02/02/2023 1100   CL 107 02/02/2023 1100   CO2 27 02/02/2023 1100   BUN 9 02/02/2023 1100   CREATININE 0.99 02/02/2023 1100      Component Value Date/Time   CALCIUM  7.7 (L) 02/02/2023 1100   ALKPHOS 363 (H) 02/02/2023 1100   AST 48 (H) 02/02/2023 1100   ALT 21 02/02/2023 1100   BILITOT 1.2 02/02/2023 1100       RADIOGRAPHIC STUDIES: I have personally reviewed the radiological images as listed and agreed with the findings in the report. No results found.

## 2023-02-01 NOTE — Assessment & Plan Note (Addendum)
 Will start gabapentin 100 mg nightly tonight Hold on [place]. Continue follow-up with palliative care

## 2023-02-02 ENCOUNTER — Inpatient Hospital Stay: Payer: BC Managed Care – PPO

## 2023-02-02 ENCOUNTER — Inpatient Hospital Stay (HOSPITAL_BASED_OUTPATIENT_CLINIC_OR_DEPARTMENT_OTHER): Payer: BC Managed Care – PPO

## 2023-02-02 ENCOUNTER — Other Ambulatory Visit: Payer: Self-pay | Admitting: *Deleted

## 2023-02-02 ENCOUNTER — Other Ambulatory Visit (HOSPITAL_COMMUNITY): Payer: Self-pay

## 2023-02-02 ENCOUNTER — Other Ambulatory Visit: Payer: Self-pay

## 2023-02-02 VITALS — BP 109/73 | HR 109 | Temp 97.3°F | Resp 17 | Wt 124.9 lb

## 2023-02-02 VITALS — BP 90/58 | HR 105 | Resp 16

## 2023-02-02 DIAGNOSIS — C787 Secondary malignant neoplasm of liver and intrahepatic bile duct: Secondary | ICD-10-CM

## 2023-02-02 DIAGNOSIS — C7951 Secondary malignant neoplasm of bone: Secondary | ICD-10-CM | POA: Diagnosis not present

## 2023-02-02 DIAGNOSIS — E860 Dehydration: Secondary | ICD-10-CM

## 2023-02-02 DIAGNOSIS — E876 Hypokalemia: Secondary | ICD-10-CM

## 2023-02-02 DIAGNOSIS — I9589 Other hypotension: Secondary | ICD-10-CM

## 2023-02-02 DIAGNOSIS — Z87891 Personal history of nicotine dependence: Secondary | ICD-10-CM | POA: Diagnosis not present

## 2023-02-02 DIAGNOSIS — I959 Hypotension, unspecified: Secondary | ICD-10-CM | POA: Insufficient documentation

## 2023-02-02 DIAGNOSIS — F5089 Other specified eating disorder: Secondary | ICD-10-CM | POA: Diagnosis not present

## 2023-02-02 DIAGNOSIS — M545 Low back pain, unspecified: Secondary | ICD-10-CM | POA: Diagnosis not present

## 2023-02-02 DIAGNOSIS — R634 Abnormal weight loss: Secondary | ICD-10-CM | POA: Diagnosis not present

## 2023-02-02 DIAGNOSIS — C4359 Malignant melanoma of other part of trunk: Secondary | ICD-10-CM | POA: Diagnosis not present

## 2023-02-02 DIAGNOSIS — C7931 Secondary malignant neoplasm of brain: Secondary | ICD-10-CM

## 2023-02-02 DIAGNOSIS — Z79891 Long term (current) use of opiate analgesic: Secondary | ICD-10-CM | POA: Diagnosis not present

## 2023-02-02 DIAGNOSIS — C7800 Secondary malignant neoplasm of unspecified lung: Secondary | ICD-10-CM | POA: Diagnosis not present

## 2023-02-02 DIAGNOSIS — R638 Other symptoms and signs concerning food and fluid intake: Secondary | ICD-10-CM | POA: Diagnosis not present

## 2023-02-02 DIAGNOSIS — G893 Neoplasm related pain (acute) (chronic): Secondary | ICD-10-CM

## 2023-02-02 DIAGNOSIS — Z9189 Other specified personal risk factors, not elsewhere classified: Secondary | ICD-10-CM

## 2023-02-02 DIAGNOSIS — Z79899 Other long term (current) drug therapy: Secondary | ICD-10-CM | POA: Diagnosis not present

## 2023-02-02 DIAGNOSIS — R1013 Epigastric pain: Secondary | ICD-10-CM | POA: Diagnosis not present

## 2023-02-02 LAB — CMP (CANCER CENTER ONLY)
ALT: 21 U/L (ref 0–44)
AST: 48 U/L — ABNORMAL HIGH (ref 15–41)
Albumin: 2.3 g/dL — ABNORMAL LOW (ref 3.5–5.0)
Alkaline Phosphatase: 363 U/L — ABNORMAL HIGH (ref 38–126)
Anion gap: 3 — ABNORMAL LOW (ref 5–15)
BUN: 9 mg/dL (ref 6–20)
CO2: 27 mmol/L (ref 22–32)
Calcium: 7.7 mg/dL — ABNORMAL LOW (ref 8.9–10.3)
Chloride: 107 mmol/L (ref 98–111)
Creatinine: 0.99 mg/dL (ref 0.44–1.00)
GFR, Estimated: >60 mL/min (ref >60)
Glucose, Bld: 98 mg/dL (ref 70–99)
Potassium: 4.4 mmol/L (ref 3.5–5.1)
Sodium: 137 mmol/L (ref 135–145)
Total Bilirubin: 1.2 mg/dL (ref 0.0–1.2)
Total Protein: 4.9 g/dL — ABNORMAL LOW (ref 6.5–8.1)

## 2023-02-02 LAB — CBC WITH DIFFERENTIAL (CANCER CENTER ONLY)
Abs Immature Granulocytes: 0.26 10*3/uL — ABNORMAL HIGH (ref 0.00–0.07)
Basophils Absolute: 0 10*3/uL (ref 0.0–0.1)
Basophils Relative: 1 %
Eosinophils Absolute: 0.1 10*3/uL (ref 0.0–0.5)
Eosinophils Relative: 3 %
HCT: 26.7 % — ABNORMAL LOW (ref 36.0–46.0)
Hemoglobin: 8.9 g/dL — ABNORMAL LOW (ref 12.0–15.0)
Immature Granulocytes: 5 %
Lymphocytes Relative: 14 %
Lymphs Abs: 0.7 10*3/uL (ref 0.7–4.0)
MCH: 31.2 pg (ref 26.0–34.0)
MCHC: 33.3 g/dL (ref 30.0–36.0)
MCV: 93.7 fL (ref 80.0–100.0)
Monocytes Absolute: 0.4 10*3/uL (ref 0.1–1.0)
Monocytes Relative: 7 %
Neutro Abs: 3.8 10*3/uL (ref 1.7–7.7)
Neutrophils Relative %: 70 %
Platelet Count: 297 10*3/uL (ref 150–400)
RBC: 2.85 MIL/uL — ABNORMAL LOW (ref 3.87–5.11)
RDW: 19.8 % — ABNORMAL HIGH (ref 11.5–15.5)
WBC Count: 5.3 10*3/uL (ref 4.0–10.5)
nRBC: 0.8 % — ABNORMAL HIGH (ref 0.0–0.2)

## 2023-02-02 LAB — VITAMIN B12: Vitamin B-12: 5081 pg/mL — ABNORMAL HIGH (ref 180–914)

## 2023-02-02 LAB — FOLATE: Folate: 3 ng/mL — ABNORMAL LOW (ref >5.9)

## 2023-02-02 LAB — LACTATE DEHYDROGENASE: LDH: 1193 U/L — ABNORMAL HIGH (ref 98–192)

## 2023-02-02 MED ORDER — ZELBORAF 240 MG PO TABS: 960.0000 mg | ORAL_TABLET | Freq: Two times a day (BID) | ORAL | 0 refills | Status: DC

## 2023-02-02 MED ORDER — GABAPENTIN 100 MG PO CAPS
100.0000 mg | ORAL_CAPSULE | Freq: Two times a day (BID) | ORAL | 0 refills | Status: DC
  Filled 2023-02-02: qty 60, 30d supply, fill #0

## 2023-02-02 MED ORDER — COTELLIC 20 MG PO TABS: 60.0000 mg | ORAL_TABLET | Freq: Every day | ORAL | 0 refills | Status: DC

## 2023-02-02 MED ORDER — SODIUM CHLORIDE 0.9 % IV SOLN: INTRAVENOUS | Status: AC

## 2023-02-02 NOTE — Assessment & Plan Note (Signed)
 Asymptomatic  Plan for MRI in Feb

## 2023-02-02 NOTE — Assessment & Plan Note (Signed)
Hold metoprolol fo rnow

## 2023-02-02 NOTE — Patient Instructions (Signed)

## 2023-02-02 NOTE — Assessment & Plan Note (Signed)
 Continue potassium 20 mEq twice daily

## 2023-02-03 ENCOUNTER — Telehealth: Payer: Self-pay

## 2023-02-03 NOTE — Telephone Encounter (Signed)
-----   Message from [name] sent at 02/02/2023  9:38 PM EST ----- Hi Mandy Please let her husband know of folate deficiency. She can take a folic acid  1 mg daily. Increase smoothies with variety of fruits and dark greens may help too to replace the pills. Thanks.

## 2023-02-03 NOTE — Telephone Encounter (Signed)
 TC to husband to inform of the instructions by Dr. Tina below. He verbalizes understanding to give Folic Acid  1 mg daily. He also asks question that was left in a MyChart message as well--requesting clarification about whether pt needs to stop Ambien  since Dr. Tina gave an Rx for Gabapentin  yesterday. Informed that per Dr. Fredrik OV note, pt is to stop Metoprolol  and Ambien  and start Gabapentin  as prescribed. Advised to reach out if pt is still having problems sleeping at night after starting Gabapentin  or for any other problems or concerns. He verbalizes understanding.

## 2023-02-09 ENCOUNTER — Inpatient Hospital Stay: Payer: BC Managed Care – PPO

## 2023-02-09 ENCOUNTER — Inpatient Hospital Stay (HOSPITAL_BASED_OUTPATIENT_CLINIC_OR_DEPARTMENT_OTHER): Payer: BC Managed Care – PPO | Admitting: Nurse Practitioner

## 2023-02-09 ENCOUNTER — Other Ambulatory Visit (HOSPITAL_COMMUNITY): Payer: Self-pay

## 2023-02-09 VITALS — BP 124/71 | HR 94 | Temp 98.0°F | Resp 17

## 2023-02-09 DIAGNOSIS — R1013 Epigastric pain: Secondary | ICD-10-CM | POA: Diagnosis not present

## 2023-02-09 DIAGNOSIS — Z7189 Other specified counseling: Secondary | ICD-10-CM

## 2023-02-09 DIAGNOSIS — C4359 Malignant melanoma of other part of trunk: Secondary | ICD-10-CM | POA: Diagnosis not present

## 2023-02-09 DIAGNOSIS — Z515 Encounter for palliative care: Secondary | ICD-10-CM | POA: Diagnosis not present

## 2023-02-09 DIAGNOSIS — M545 Low back pain, unspecified: Secondary | ICD-10-CM | POA: Diagnosis not present

## 2023-02-09 DIAGNOSIS — C7931 Secondary malignant neoplasm of brain: Secondary | ICD-10-CM

## 2023-02-09 DIAGNOSIS — Z79899 Other long term (current) drug therapy: Secondary | ICD-10-CM | POA: Diagnosis not present

## 2023-02-09 DIAGNOSIS — C787 Secondary malignant neoplasm of liver and intrahepatic bile duct: Secondary | ICD-10-CM | POA: Diagnosis not present

## 2023-02-09 DIAGNOSIS — R630 Anorexia: Secondary | ICD-10-CM

## 2023-02-09 DIAGNOSIS — E860 Dehydration: Secondary | ICD-10-CM

## 2023-02-09 DIAGNOSIS — R530 Neoplastic (malignant) related fatigue: Secondary | ICD-10-CM

## 2023-02-09 DIAGNOSIS — Z79891 Long term (current) use of opiate analgesic: Secondary | ICD-10-CM | POA: Diagnosis not present

## 2023-02-09 DIAGNOSIS — E876 Hypokalemia: Secondary | ICD-10-CM | POA: Diagnosis not present

## 2023-02-09 DIAGNOSIS — R110 Nausea: Secondary | ICD-10-CM | POA: Diagnosis not present

## 2023-02-09 DIAGNOSIS — G893 Neoplasm related pain (acute) (chronic): Secondary | ICD-10-CM

## 2023-02-09 DIAGNOSIS — F5089 Other specified eating disorder: Secondary | ICD-10-CM | POA: Diagnosis not present

## 2023-02-09 DIAGNOSIS — R634 Abnormal weight loss: Secondary | ICD-10-CM | POA: Diagnosis not present

## 2023-02-09 DIAGNOSIS — C7951 Secondary malignant neoplasm of bone: Secondary | ICD-10-CM | POA: Diagnosis not present

## 2023-02-09 DIAGNOSIS — C7800 Secondary malignant neoplasm of unspecified lung: Secondary | ICD-10-CM | POA: Diagnosis not present

## 2023-02-09 DIAGNOSIS — Z87891 Personal history of nicotine dependence: Secondary | ICD-10-CM | POA: Diagnosis not present

## 2023-02-09 DIAGNOSIS — R531 Weakness: Secondary | ICD-10-CM

## 2023-02-09 DIAGNOSIS — I959 Hypotension, unspecified: Secondary | ICD-10-CM | POA: Diagnosis not present

## 2023-02-09 MED ORDER — ONDANSETRON HCL 4 MG/2ML IJ SOLN
8.0000 mg | Freq: Once | INTRAMUSCULAR | Status: AC
  Administered 2023-02-09: 8 mg via INTRAVENOUS
  Filled 2023-02-09: qty 4

## 2023-02-09 MED ORDER — FAMOTIDINE IN NACL 20-0.9 MG/50ML-% IV SOLN
20.0000 mg | Freq: Once | INTRAVENOUS | Status: AC
  Administered 2023-02-09: 20 mg via INTRAVENOUS
  Filled 2023-02-09: qty 50

## 2023-02-09 MED ORDER — FENTANYL 12 MCG/HR TD PT72
1.0000 | MEDICATED_PATCH | TRANSDERMAL | 0 refills | Status: DC
  Filled 2023-02-09: qty 10, 30d supply, fill #0

## 2023-02-09 MED ORDER — TRAMADOL HCL 50 MG PO TABS
50.0000 mg | ORAL_TABLET | Freq: Four times a day (QID) | ORAL | 0 refills | Status: DC | PRN
  Filled 2023-02-09: qty 45, 6d supply, fill #0

## 2023-02-09 MED ORDER — DEXAMETHASONE SODIUM PHOSPHATE 10 MG/ML IJ SOLN
10.0000 mg | Freq: Once | INTRAMUSCULAR | Status: AC
  Administered 2023-02-09: 10 mg via INTRAVENOUS
  Filled 2023-02-09: qty 1

## 2023-02-09 MED ORDER — ONDANSETRON 8 MG PO TBDP
8.0000 mg | ORAL_TABLET | Freq: Three times a day (TID) | ORAL | 2 refills | Status: DC | PRN
  Filled 2023-02-09: qty 18, 21d supply, fill #0
  Filled 2023-02-26 – 2023-03-01 (×2): qty 18, 21d supply, fill #1

## 2023-02-09 MED ORDER — SODIUM CHLORIDE 0.9 % IV SOLN: INTRAVENOUS | Status: AC

## 2023-02-09 MED ORDER — ONDANSETRON 8 MG PO TBDP
4.0000 mg | ORAL_TABLET | Freq: Three times a day (TID) | ORAL | 3 refills | Status: DC | PRN
  Filled 2023-02-09: qty 60, 40d supply, fill #0

## 2023-02-09 MED ORDER — HYDROMORPHONE HCL 1 MG/ML IJ SOLN
0.5000 mg | Freq: Once | INTRAMUSCULAR | Status: AC
  Administered 2023-02-09: 0.5 mg via INTRAVENOUS
  Filled 2023-02-09: qty 1

## 2023-02-09 NOTE — Patient Instructions (Signed)

## 2023-02-09 NOTE — Progress Notes (Signed)
 Palliative Medicine [facility]  Telephone:(336) [phone] [phone]) [phone]   Name: Laura [name] Date: 02/09/2023 MRN: [id number]  DOB: 1965/03/08  Patient Care Team: Katina Pfeiffer, [name] as PCP - General (Family Medicine) Pickenpack-Cousar, Fannie SAILOR, NP as Nurse Practitioner [facility] and Palliative Medicine)    INTERVAL HISTORY: Laura [name] is a 58 y.o. female with oncologic medical history including malignant melanoma (10/2022) with metastatic disease to the bone. As well as a history of hypertension, hyperlipidemia, GERD, and arthritis. Palliative ask to see for symptom management and goals of care.   SOCIAL HISTORY:     reports that she quit smoking about 7 years ago. Her smoking use included cigarettes. She started smoking about 27 years ago. She has a 20 pack-year smoking history. She has never used smokeless tobacco. She reports that she does not drink alcohol and does not use drugs.  ADVANCE DIRECTIVES:  None on file  CODE STATUS: Full code  PAST MEDICAL HISTORY: Past Medical History:  Diagnosis Date   Anxiety    Arthritis    Complication of anesthesia    Fibromyalgia    Gallstones 04/20/2018   GERD (gastroesophageal reflux disease)    History of blood transfusion    post Hysterectomy   Hypercholesteremia    Melanoma (HCC)    on back and excised   Migraine    PONV (postoperative nausea and vomiting)    Tobacco abuse    Vertebral artery disease (HCC)    history of left vertebral artery dissection 2011    ALLERGIES:  is allergic to aspirin.  MEDICATIONS:  Current Outpatient Medications  Medication Sig Dispense Refill   AMBIEN  CR 6.25 MG CR tablet Take 6.25 mg by mouth at bedtime. Hold as of 02/02/23     busPIRone  (BUSPAR ) 15 MG tablet Take 15 mg by mouth 2 (two) times daily.     cobimetinib fumarate  (COTELLIC ) 20 MG tablet Take 3 tablets (60 mg total) by mouth daily. Take on days 1-21. Repeat every 28 days. Avoid sun exposure. 63  tablet 0   dronabinol  (MARINOL ) 10 MG capsule Take 1 capsule (10 mg total) by mouth 2 (two) times daily. 60 capsule 0   famotidine -calcium  carbonate-magnesium  hydroxide (PEPCID  COMPLETE) 10-800-165 MG chewable tablet Chew 1 tablet by mouth in the morning.     folic acid  (FOLVITE ) 1 MG tablet Take 1 mg by mouth daily.     gabapentin  (NEURONTIN ) 100 MG capsule Take 1 capsule (100 mg total) by mouth 2 (two) times daily. 60 capsule 0   HYDROmorphone  (DILAUDID ) 2 MG tablet Take 1 tablet (2 mg total) by mouth every 6 (six) hours as needed for severe pain (pain score 7-10). 30 tablet 0   metoprolol  tartrate (LOPRESSOR ) 25 MG tablet Take 1 tablet (25 mg total) by mouth 2 (two) times daily. 60 tablet 1   ondansetron  (ZOFRAN -ODT) 4 MG disintegrating tablet Dissolve 1 tablet (4 mg total) by mouth every 8 (eight) hours as needed for nausea or vomiting. 60 tablet 0   pantoprazole  (PROTONIX ) 40 MG tablet Take 1 tablet (40 mg total) by mouth daily. 30 tablet 2   potassium chloride  (KLOR-CON  M) 10 MEQ tablet Take 2 tablets (20 mEq total) by mouth 2 (two) times daily. 120 tablet 1   prochlorperazine  (COMPAZINE ) 5 MG tablet Take 1 tablet (5 mg total) by mouth every 6 (six) hours as needed for nausea or vomiting. 60 tablet 1   rosuvastatin  (CRESTOR ) 10 MG tablet Take 10 mg  by mouth at bedtime.     vemurafenib  (ZELBORAF ) 240 MG tablet Take 4 tablets (960 mg total) by mouth every 12 (twelve) hours. Take with water . 224 tablet 0   No current facility-administered medications for this visit.    VITAL SIGNS: There were no vitals taken for this visit. There were no vitals filed for this visit.  Estimated body mass index is 22.13 kg/m as calculated from the following:   Height as of 01/26/23: 5' 3 (1.6 m).   Weight as of 02/02/23: 124 lb 14.4 oz (56.7 kg).   PERFORMANCE STATUS (ECOG) : 1 - Symptomatic but completely ambulatory   Physical Exam General: Fatigued, in recliner Cardiovascular: regular rate and  rhythm Pulmonary: clear ant fields Abdomen: soft, nontender, + bowel sounds Extremities: no edema, no joint deformities Skin: no rashes Neurological: AAO x3  Discussed the use of AI scribe software for clinical note transcription with the patient, who gave verbal consent to proceed.   IMPRESSION:  I saw Laura [name] during her infusion. She was brought in today unexpectedly due to uncontrolled symptoms. She presents with ongoing issues of nausea and pain. Her [name] is present. Laura [name] reports that the prescribed Zofran  and Compazine  for nausea have not been effective, leading to the occasional use of both medications.  The patient also reports significant pain, which has been poorly managed with various medications, including Dilaudid , morphine , and Oxycodone . The patient describes the pain medications as causing significant drowsiness and confusion, with symptoms worsening with each subsequent dose. Laura [name] reports concern that patient is not clearing the medication from her sister. If she takes multiple times in a day it seems to build up however with long periods in between she tends to do ok such as once a day. Unfortunately, her pain is uncontrolled because of this. Laura [name] reports patient has been more like herself in terms of her mentation over the past week which they are appreciative of and hopes to not compromise if possible. Recently started on gabapentin  100mg  at bedtime. Tolerating without difficulty.   Laura [name] also reports a recent weight loss of approximately 30-35 pounds since the start of her cancer treatment. She has been experiencing constipation, which has been managed with MiraLAX  and Senna, but with limited success. She is taking one Senna twice daily. Education provided on better management with instructions to increase Senna to 2 tablets at bedtime. If no improvement may increase to 2 tablets twice daily.   The patient has a history of a vertebral  fracture and cancer involvement which was discovered two months ago and treated with radiation. She reports initial improvement in pain following radiation, but the pain has since worsened. We discussed consideration of procedural involvement such as OsteoCool or kyphoplasty. We briefly discussed patient having to be evaluated for candidacy and cleared by Dr. Tina if procedures are an option. She and [name] verbalized understanding.   The patient has been on a regimen of Zelboraf  and Cotellic . Her [name] patient feels significantly better during the seven-day break from these medications, but symptoms worsen upon resumption.  The patient also reports issues with sleep, stating that she does not sleep well. Often napping but unable to get a good night's rest.   The patient's caregiver has been actively involved in her care, administering medications and monitoring the patient's symptoms. We discussed her nausea at length. Advised to continue with Zofran , increasing dose to 8mg  alternating with Compazine  as needed. Advised to continue with Gabapentin  as prescribed. Education  provided on use of Tramadol  and Fentanyl  patch for pain management with hopes patient is able to better tolerate. Discussed use of Fentanyl  patch compared to oral extended-release medications. Education provided on use, administration, efficacy, and possible side effects. [name] is aware of significant changes noted to immediately remove patch. Will administer IV pain medications and antiemetics while in clinic today for symptom management needs.   In summary, the patient is dealing with ongoing nausea and pain, which have been poorly managed with current medications. She has experienced significant weight loss and constipation. Her sleep is poor despite medication, and her mental state has been affected by her pain management regimen. The patient's cancer treatment has also been challenging, with symptoms worsening during 21-23 day  cycle. She is clear in expressed wishes to aggressively manage her symptoms while treating the treatable.   All questions answered and support provided.  Assessment and Plan  Cancer-related pain   Patient experiencing significant pain that is not well controlled with oral hydromorphone . Current regimen of oral Dilaudid  not well-tolerated, leading to confusion and drowsiness after several doses in the day.  -Start Fentanyl  12.5 mcg patch at lowest dose for continuous pain control.  Education provided on use and possible side effects.  -Consider Tramadol  50-100mg  for breakthrough pain.   -Explore possibility of interventional radiology procedure (OsteoCool) or Kyphoplasty for pain relief.    Nausea   Patient experiencing nausea, currently on Compazine  and Zofran . Compazine  causing drowsiness and confusion.   -Increase Zofran  to 8mg  as needed for nausea.   -Continue alternating Zofran  and Compazine  every 6 hours as needed.    Insomnia   Patient reporting poor sleep. Currently on Gabapentin , which was prescribed for sleep but is not effective.   -Continue Gabapentin  as it may be providing some pain relief.   -Consider further evaluation or referral for sleep issues if she persists. Hopeful if symptoms become better managed this will improve insomnia.   Constipation   Patient experiencing constipation, likely secondary to opioid use.   -Continue MiraLAX  twice daily and Senna one tablet twice daily.  -Increase Senna to two pills at night if needed.    Cancer treatment   Patient on Cotellic  and Zelboraf  for cancer treatment. Experiencing significant side effects, including weight loss and fatigue.   -Continue Cotellic  and Zelboraf  as prescribed per Dr. Tina.  -Consider genetic testing for patient and family members due to family history of cancer.    Follow-up   -Call patient on Friday to assess response to changes in medication regimen.   -PET scan scheduled for 02/15/2023 per Dr. Tina.   -Consider referral to interventional radiology for additional pain support.  I discussed the importance of continued conversation with family and their medical providers regarding overall plan of care and treatment options, ensuring decisions are within the context of the patients values and GOCs.   Patient expressed understanding and was in agreement with this plan. She also understands that She can call the clinic at any time with any questions, concerns, or complaints.   Any controlled substances utilized were prescribed in the context of palliative care. PDMP has been reviewed.   Visit consisted of counseling and education dealing with the complex and emotionally intense issues of symptom management and palliative care in the setting of serious and potentially life-threatening illness.  Levon Borer, AGPCNP-BC  Palliative Medicine Team/[facility]

## 2023-02-10 ENCOUNTER — Other Ambulatory Visit: Payer: Self-pay

## 2023-02-11 ENCOUNTER — Other Ambulatory Visit: Payer: Self-pay | Admitting: Nurse Practitioner

## 2023-02-11 ENCOUNTER — Other Ambulatory Visit: Payer: Self-pay

## 2023-02-12 ENCOUNTER — Encounter: Payer: BC Managed Care – PPO | Admitting: Dietician

## 2023-02-12 ENCOUNTER — Other Ambulatory Visit: Payer: BC Managed Care – PPO

## 2023-02-12 ENCOUNTER — Ambulatory Visit: Payer: BC Managed Care – PPO

## 2023-02-12 ENCOUNTER — Other Ambulatory Visit (HOSPITAL_COMMUNITY): Payer: Self-pay

## 2023-02-12 MED ORDER — DRONABINOL 10 MG PO CAPS
10.0000 mg | ORAL_CAPSULE | Freq: Three times a day (TID) | ORAL | 0 refills | Status: DC
  Filled 2023-02-12 (×2): qty 90, 30d supply, fill #0

## 2023-02-14 ENCOUNTER — Other Ambulatory Visit: Payer: Self-pay | Admitting: Nurse Practitioner

## 2023-02-14 DIAGNOSIS — G893 Neoplasm related pain (acute) (chronic): Secondary | ICD-10-CM

## 2023-02-14 DIAGNOSIS — C787 Secondary malignant neoplasm of liver and intrahepatic bile duct: Secondary | ICD-10-CM

## 2023-02-14 DIAGNOSIS — R110 Nausea: Secondary | ICD-10-CM

## 2023-02-15 ENCOUNTER — Encounter (HOSPITAL_COMMUNITY)
Admission: RE | Admit: 2023-02-15 | Discharge: 2023-02-15 | Disposition: A | Discharge status: Home / Self Care | Payer: BC Managed Care – PPO | Source: Ambulatory Visit

## 2023-02-15 ENCOUNTER — Encounter: Payer: Self-pay | Admitting: Nurse Practitioner

## 2023-02-15 ENCOUNTER — Other Ambulatory Visit (HOSPITAL_COMMUNITY): Payer: Self-pay

## 2023-02-15 ENCOUNTER — Telehealth: Payer: Self-pay

## 2023-02-15 ENCOUNTER — Inpatient Hospital Stay (HOSPITAL_BASED_OUTPATIENT_CLINIC_OR_DEPARTMENT_OTHER): Payer: BC Managed Care – PPO | Admitting: Nurse Practitioner

## 2023-02-15 ENCOUNTER — Inpatient Hospital Stay: Payer: BC Managed Care – PPO

## 2023-02-15 DIAGNOSIS — C7951 Secondary malignant neoplasm of bone: Secondary | ICD-10-CM

## 2023-02-15 DIAGNOSIS — Z515 Encounter for palliative care: Secondary | ICD-10-CM | POA: Diagnosis not present

## 2023-02-15 DIAGNOSIS — R634 Abnormal weight loss: Secondary | ICD-10-CM

## 2023-02-15 DIAGNOSIS — Z79899 Other long term (current) drug therapy: Secondary | ICD-10-CM | POA: Diagnosis not present

## 2023-02-15 DIAGNOSIS — I959 Hypotension, unspecified: Secondary | ICD-10-CM | POA: Diagnosis not present

## 2023-02-15 DIAGNOSIS — C4359 Malignant melanoma of other part of trunk: Secondary | ICD-10-CM | POA: Diagnosis not present

## 2023-02-15 DIAGNOSIS — G893 Neoplasm related pain (acute) (chronic): Secondary | ICD-10-CM | POA: Diagnosis not present

## 2023-02-15 DIAGNOSIS — M545 Low back pain, unspecified: Secondary | ICD-10-CM | POA: Diagnosis not present

## 2023-02-15 DIAGNOSIS — R110 Nausea: Secondary | ICD-10-CM

## 2023-02-15 DIAGNOSIS — C787 Secondary malignant neoplasm of liver and intrahepatic bile duct: Secondary | ICD-10-CM | POA: Insufficient documentation

## 2023-02-15 DIAGNOSIS — R918 Other nonspecific abnormal finding of lung field: Secondary | ICD-10-CM | POA: Diagnosis not present

## 2023-02-15 DIAGNOSIS — R1013 Epigastric pain: Secondary | ICD-10-CM | POA: Diagnosis not present

## 2023-02-15 DIAGNOSIS — C7931 Secondary malignant neoplasm of brain: Secondary | ICD-10-CM | POA: Diagnosis not present

## 2023-02-15 DIAGNOSIS — E876 Hypokalemia: Secondary | ICD-10-CM | POA: Diagnosis not present

## 2023-02-15 DIAGNOSIS — R630 Anorexia: Secondary | ICD-10-CM | POA: Diagnosis not present

## 2023-02-15 DIAGNOSIS — C799 Secondary malignant neoplasm of unspecified site: Secondary | ICD-10-CM | POA: Diagnosis not present

## 2023-02-15 DIAGNOSIS — Z87891 Personal history of nicotine dependence: Secondary | ICD-10-CM | POA: Diagnosis not present

## 2023-02-15 DIAGNOSIS — C7800 Secondary malignant neoplasm of unspecified lung: Secondary | ICD-10-CM | POA: Diagnosis not present

## 2023-02-15 DIAGNOSIS — Z79891 Long term (current) use of opiate analgesic: Secondary | ICD-10-CM | POA: Diagnosis not present

## 2023-02-15 DIAGNOSIS — R530 Neoplastic (malignant) related fatigue: Secondary | ICD-10-CM

## 2023-02-15 DIAGNOSIS — F5089 Other specified eating disorder: Secondary | ICD-10-CM | POA: Diagnosis not present

## 2023-02-15 LAB — GLUCOSE, CAPILLARY: Glucose-Capillary: 79 mg/dL (ref 70–99)

## 2023-02-15 MED ORDER — SODIUM CHLORIDE 0.9 % IV SOLN: INTRAVENOUS | Status: DC

## 2023-02-15 MED ORDER — ONDANSETRON HCL 4 MG/2ML IJ SOLN
8.0000 mg | Freq: Once | INTRAMUSCULAR | Status: AC
  Administered 2023-02-15: 8 mg via INTRAVENOUS
  Filled 2023-02-15: qty 4

## 2023-02-15 MED ORDER — SCOPOLAMINE 1 MG/3DAYS TD PT72
1.0000 | MEDICATED_PATCH | TRANSDERMAL | 12 refills | Status: DC
  Filled 2023-02-15: qty 10, 30d supply, fill #0
  Filled 2023-03-08 – 2023-03-10 (×2): qty 10, 30d supply, fill #1
  Filled 2023-04-14: qty 10, 30d supply, fill #2

## 2023-02-15 MED ORDER — FLUDEOXYGLUCOSE F - 18 (FDG) INJECTION
5.2500 | Freq: Once | INTRAVENOUS | Status: AC
  Administered 2023-02-15: 5.22 via INTRAVENOUS

## 2023-02-15 NOTE — Progress Notes (Signed)
 This encounter was created in error - please disregard.

## 2023-02-15 NOTE — Progress Notes (Signed)
Pt reported to [facility] this morning for IVFs. Pt informed this RN that she threw up all of her morning meds approximately 10 minutes after she took them. Pt also had c/o not no appetite and Pt stated, "I have not eaten anything since Friday (02/12/2023)." This RN noted a weight loss from 124 lb 14 oz on 02/02/2023 to 105 lb 6 oz taken today.  This RN made [facility] NP aware.   Per Lowella Bandy NP OK to infuse IVFs at 750 mL/hr to ensure they are completed prior to PET scan at 1145 AM.

## 2023-02-15 NOTE — Telephone Encounter (Signed)
TC from pt's husband, asking if pt should take her morning meds since she was advised not to have POs prior to her PET scan today. He is also wondering if she should take compazine for nausea. Pt reports nausea, which is common/ongoing, despite taking zofran at 0700. Husband states that she typically takes PRN compazine w/ her AM medications. Advised that she can take usual AM meds w/ a few sips of water, and encouraged to take compazine if she feels nauseated to the point where she is at risk of vomiting her AM meds; otherwise, informed that she can likely get IV compazine today with her IVF, which may be more effective. Advised to report all meds that she takes this AM to [facility] staff. Pt and husband verbalize understanding.

## 2023-02-15 NOTE — Progress Notes (Signed)
Palliative Medicine [facility]  Telephone:(336) [phone] [phone]) [phone]   Name: Laura [name] Date: 02/15/2023 MRN: [id number]  DOB: 05-Jun-1965  Patient Care Team: [name], [name] as PCP - General (Family Medicine) Pickenpack-Cousar, Arty Baumgartner, NP as Nurse Practitioner [facility] and Palliative Medicine)    INTERVAL HISTORY: Laura [name] is a 58 y.o. female with oncologic medical history including malignant melanoma (10/2022) with metastatic disease to the bone. As well as a history of hypertension, hyperlipidemia, GERD, and arthritis. Palliative ask to see for symptom management and goals of care.   SOCIAL HISTORY:     reports that she quit smoking about 7 years ago. Her smoking use included cigarettes. She started smoking about 27 years ago. She has a 20 pack-year smoking history. She has never used smokeless tobacco. She reports that she does not drink alcohol and does not use drugs.  ADVANCE DIRECTIVES:  None on file  CODE STATUS: Full code  PAST MEDICAL HISTORY: Past Medical History:  Diagnosis Date   Anxiety    Arthritis    Complication of anesthesia    Fibromyalgia    Gallstones 04/20/2018   GERD (gastroesophageal reflux disease)    History of blood transfusion    post Hysterectomy   Hypercholesteremia    Melanoma (HCC)    on back and excised   Migraine    PONV (postoperative nausea and vomiting)    Tobacco abuse    Vertebral artery disease (HCC)    history of left vertebral artery dissection 2011    ALLERGIES:  is allergic to aspirin.  MEDICATIONS:  Current Outpatient Medications  Medication Sig Dispense Refill   AMBIEN CR 6.25 MG CR tablet Take 6.25 mg by mouth at bedtime. Hold as of 02/02/23     busPIRone (BUSPAR) 15 MG tablet Take 15 mg by mouth 2 (two) times daily.     COTELLIC 20 MG tablet TAKE 3 TABLETS BY MOUTH 1 TIME A DAY ON DAYS 1-21 EVERY 28 DAYS. AVOID SUN EXPOSURE 63 tablet 0   dronabinol (MARINOL) 10 MG  capsule Take 1 capsule (10 mg total) by mouth 3 (three) times daily. 90 capsule 0   famotidine-calcium carbonate-magnesium hydroxide (PEPCID COMPLETE) 10-800-165 MG chewable tablet Chew 1 tablet by mouth in the morning.     fentaNYL (DURAGESIC) 12 MCG/HR Place 1 patch onto the skin every 3 (three) days. 10 patch 0   folic acid (FOLVITE) 1 MG tablet Take 1 mg by mouth daily.     gabapentin (NEURONTIN) 100 MG capsule Take 1 capsule (100 mg total) by mouth 2 (two) times daily. 60 capsule 0   HYDROmorphone (DILAUDID) 2 MG tablet Take 1 tablet (2 mg total) by mouth every 6 (six) hours as needed for severe pain (pain score 7-10). 30 tablet 0   metoprolol tartrate (LOPRESSOR) 25 MG tablet Take 1 tablet (25 mg total) by mouth 2 (two) times daily. 60 tablet 1   ondansetron (ZOFRAN-ODT) 8 MG disintegrating tablet Dissolve 1 tablet (8 mg total) by mouth every 8 (eight) hours as needed for nausea or vomiting. 20 tablet 2   pantoprazole (PROTONIX) 40 MG tablet Take 1 tablet (40 mg total) by mouth daily. 30 tablet 2   potassium chloride (KLOR-CON M) 10 MEQ tablet Take 2 tablets (20 mEq total) by mouth 2 (two) times daily. 120 tablet 1   prochlorperazine (COMPAZINE) 5 MG tablet Take 1 tablet (5 mg total) by mouth every 6 (six) hours as needed for nausea  or vomiting. 60 tablet 1   rosuvastatin (CRESTOR) 10 MG tablet Take 10 mg by mouth at bedtime.     traMADol (ULTRAM) 50 MG tablet Take 1-2 tablets (50-100 mg total) by mouth every 6 (six) hours as needed for moderate pain (pain score 4-6) or severe pain (pain score 7-10). 45 tablet 0   ZELBORAF 240 MG tablet TAKE 4 TABLETS BY MOUTH EVERY 12 HOURS WITH WATER. 224 tablet 0   No current facility-administered medications for this visit.    VITAL SIGNS: There were no vitals taken for this visit. There were no vitals filed for this visit.  Estimated body mass index is 22.13 kg/m as calculated from the following:   Height as of 01/26/23: 5\' 3"  (1.6 m).   Weight as  of 02/02/23: 124 lb 14.4 oz (56.7 kg).   PERFORMANCE STATUS (ECOG) : 1 - Symptomatic but completely ambulatory   Physical Exam General: Fatigued, in recliner Cardiovascular: regular rate and rhythm Pulmonary: clear ant fields Abdomen: soft, nontender, + bowel sounds Extremities: no edema, no joint deformities Skin: no rashes Neurological: AAO x3  Discussed the use of AI scribe software for clinical note transcription with the patient, who gave verbal consent to proceed.   IMPRESSION:  I saw [name] during her IV infusion in symptom management. Her husband is present. Patient brought in for symptom management needs prior to PET scan due to persistent nausea and vomiting. States she spent the entire day yesterday in bed. She reports a cycle of nausea that is worse when sitting up and improves when lying flat. The patient has been taking Zofran and Compazine for nausea, but despite these medications, she has vomited three times over an unspecified period. Also reports episodes of diarrhea, but not on a daily basis. One episode was severe, with the patient describing simultaneous vomiting and diarrhea. Acknowledges that her symptoms tend to resurface and worsen once she restarts her oral chemotherapy.   Mrs. Swallows becomes tearful when speaking about her overall quality specifically to ongoing weight loss.  The patient's appetite has been significantly affected by the persistent nausea, leading to weight loss. Weight today is down to 160lbs from 169lbs on 1/10. We discussed hopes of improving nausea which would reflect on her appetite. During the 7 days she is off treatment she feels her appetite is the best. Patient also with some improvement with use of Marinol however they have run out due to changes in dosing and previous taken doses. [name], RN to contact pharmacy to authorize override. She also reports a history of difficulty tolerating certain nutritional supplements, such as Ensure Boost,  due to its milk base. She has tried a product called Intrade, which she initially liked but then developed a distaste for. Kaleema has been trying to maintain some nutritional intake with milk mixed with a breakfast chocolate mix.  Education provided on use of scopolamine patch for additional support. I discussed administration, use, efficacy, and potential side effects. Patient and husband verbalized understanding.   The patient's pain is reportedly well-managed with a pain patch and occasional use of Tramadol. She and husband are much appreciative of this and her ability to tolerate. Denies any unwanted side effects. Does not require breakthrough medication around the clock. We will continue with current regimen with no changes.   We will continue to closely monitor and support.  Assessment and Plan  Nausea and Vomiting Daily nausea with intermittent vomiting. Currently on Zofran and Compazine. Diarrhea noted a few days ago  but not consistent. -Prescribe Scopolamine patch, apply behind the ear every three days, alternating sides. -Continue current nausea medications as needed. Zofran 8mg  and Compazine 10mg  as needed.  -Check for constipation as a potential side effect of Scopolamine. -1L NS IVF and Zofran 8mg  IV while in clinic today.   Pain Management Pain controlled with Fentanyl patch and intermittent Tramadol. -Continue current pain management regimen. -Fentanyl patch 12.30mcg  -Tramadol 50-100mg  every 4 hours as needed.   Appetite and Weight Loss Poor appetite and weight loss noted. Difficulty maintaining consistent intake due to nausea and changing taste preferences. -Continue Marinol as soon as it can be refilled. Checked with pharmacy and provided authorization to fill.  -Consider small, frequent meals throughout the day. -Explore other nutritional supplement options, such as clear, juice-based supplements.  Follow-up Meeting with a dietician for additional support. Appointment  scheduled for 1/24.  -Continue to monitor weight, appetite, and response to anti-nausea medications. -I will plan to see patient back in 2-3 weeks. Sooner if needed.  I discussed the importance of continued conversation with family and their medical providers regarding overall plan of care and treatment options, ensuring decisions are within the context of the patients values and GOCs.   Patient expressed understanding and was in agreement with this plan. She also understands that She can call the clinic at any time with any questions, concerns, or complaints.   Any controlled substances utilized were prescribed in the context of palliative care. PDMP has been reviewed.   Visit consisted of counseling and education dealing with the complex and emotionally intense issues of symptom management and palliative care in the setting of serious and potentially life-threatening illness.  Willette Alma, AGPCNP-BC  Palliative Medicine Team/[facility]

## 2023-02-15 NOTE — Progress Notes (Signed)
Pt requested to leave PIV in for PET scan today, this RN made [facility] NP aware.  Per Lowella Bandy NP OK to leave PIV site in for PET scan today at 1200 PM.  At time of d/c PIV site was flushed, blood return noted, saline locked, and dressing was reinforced.

## 2023-02-15 NOTE — Patient Instructions (Signed)

## 2023-02-18 NOTE — Assessment & Plan Note (Signed)
LFT improved. PET showed response. Continue monitor. Continue vemurafenib 960 mg twice daily, and cobimetinib 60 mg once daily. Will complete cycle 2. CBC, CMP, LDH on  with follow up Will resume nivolumab

## 2023-02-18 NOTE — Assessment & Plan Note (Signed)
 Secondary to diffuse liver metastases Likely will be limitation to her prognosis

## 2023-02-18 NOTE — Assessment & Plan Note (Signed)
Asymptomatic  Plan for MRI in Feb

## 2023-02-18 NOTE — Progress Notes (Signed)
Patient Care Team: [name], [name] as PCP - General (Family Medicine) Pickenpack-Cousar, Arty Baumgartner, NP as Nurse Practitioner [facility] and Palliative Medicine)  Clinic Day:  02/19/2023  Referring physician: Melven Sartorius, MD  ASSESSMENT & PLAN:  Addendum Repeat BMP and magnesium on 1/28 with IV fluid.  Assessment & Plan: 58 y.o.female currently undergoing active treatment for stage IV melanoma.    Diagnosis: 11/25/2022. Diffuse liver, bone, lung metastases. Brain metastases after starting systemic treatment Treatment: 12/11/2022. First line ipi/nivo. Completed 2 cycles. 12/14/22-12/28/22 radiation to L-spine 30Gy/58fx 12/14  vemurafenib and cobimetinib due to disease uncontrolled or worsening symptoms, liver metastases   Due to sign of clinical progression of liver metastases, new abdominal pain, loss of appetite, changed to BRAF/MEC inhibitors for disease control. Discussed potential side effects. Her insurance only authorized vemurafenib and cobimetinib so she started on 12/14. Clinically stable. LFT improved again today.  Interval PET/CT showed response.  Overall disease burden had decreased.  We discussed risk and benefit of any immunotherapy.  Potential severe side effects can occur.  After discussion, we will resume nivolumab and complete third cycle of vemurafenib and cobimetinib.   Will need to monitor closely for liver failure with labs and clinical exam.  Worsening hypokalemia.  Will receive IV fluid and replacement of potassium today.  Follow-up next week.  Melanoma metastatic to liver [facility]) LFT improved. PET showed response. Continue monitor. Continue vemurafenib 960 mg twice daily, and cobimetinib 60 mg once daily. Will complete cycle 2. CBC, CMP, LDH on  with follow up Will resume nivolumab  Metastatic cancer to brain [facility]) Asymptomatic  Plan for MRI in Feb  Liver dysfunction Secondary to diffuse liver metastases Likely will be limitation to her  prognosis  Decreased appetite Marinol to 10 mg twice daily. New script sent  Cancer associated pain Continue fentanyl patch Continue follow-up with palliative care Refer for evaluation for kyphoplasty  Hypokalemia 20 mEq of PO potassium chloride before and after IV fluid today.  Prescribed potassium chloride 20 mEq liquid twice daily to [employer].  May start today  Repeat labs on Tuesday morning.  Dyspepsia Will use Pepcid 20 mg twice daily instead of PPI.  At risk for side effect of medication EKG on 12/30 and then montly x3 months  Monitor for diarrhea, nausea, rash, MSK pain, visual changes Monitor LFT Monitor for irAEs   Will refer for Mediport placement. Will refer for kyphoplasty.  The patient understands the plans discussed today and is in agreement with them.  She knows to contact our office if she develops concerns prior to her next appointment.  Melven Sartorius, MD  [facility] - A DEPT OF [facility] [address] [place] [address] Dept: [phone] Dept Fax: [phone]   Orders Placed This Encounter  Procedures   IR IMAGING GUIDED PORT INSERTION    Standing Status:   Future    Expected Date:   02/24/2023    Expiration Date:   02/19/2024    Reason for Exam (SYMPTOM  OR DIAGNOSIS REQUIRED):   mediport for treatment for cancer    Is the patient pregnant?:   No    Preferred Imaging Location?:   [facility]   Ambulatory referral to Orthopedic Surgery    Referral Priority:   Routine    Referral Type:   Surgical    Referral Reason:   Specialty Services Required    Referred to Provider:  London Sheer, MD    Requested Specialty:   Orthopedic Surgery    Number of Visits Requested:   1      CHIEF COMPLAINT:  CC: stage IV melanoma  INTERVAL HISTORY:  Laura [name] is here today for repeat clinical assessment. She denies fevers or chills.  Her appetite is stable and marinol 10  mg twice daily. She feels her energy may be going down. Report of pain on the left side of chest when getting up. She gets tired easier. No stomach pain but nausea. She is using scopolamine patch. No diarrhea this week. He is drinking more but still not enough, urine is slight dark. Report mild headaches and ear ache.  Lower back pain across the buttock has improved using fentanyl and 1-2 tramadol a day.  I have reviewed the past medical history, past surgical history, social history and family history with the patient and they are unchanged from previous note.  ALLERGIES:  is allergic to aspirin.  MEDICATIONS:  Current Outpatient Medications  Medication Sig Dispense Refill   Potassium Chloride 40 MEQ/15ML (20%) SOLN Take 7.5 ml (20 mEq) by mouth 2 (two) times daily. 240 mL 0   AMBIEN CR 6.25 MG CR tablet Take 6.25 mg by mouth at bedtime. Hold as of 02/02/23     COTELLIC 20 MG tablet TAKE 3 TABLETS BY MOUTH 1 TIME A DAY ON DAYS 1-21 EVERY 28 DAYS. AVOID SUN EXPOSURE 63 tablet 0   dronabinol (MARINOL) 10 MG capsule Take 1 capsule (10 mg total) by mouth 3 (three) times daily. 90 capsule 0   famotidine-calcium carbonate-magnesium hydroxide (PEPCID COMPLETE) 10-800-165 MG chewable tablet Chew 1 tablet by mouth in the morning.     fentaNYL (DURAGESIC) 12 MCG/HR Place 1 patch onto the skin every 3 (three) days. 10 patch 0   folic acid (FOLVITE) 1 MG tablet Take 1 mg by mouth daily.     gabapentin (NEURONTIN) 100 MG capsule Take 1 capsule (100 mg total) by mouth 2 (two) times daily. 60 capsule 0   HYDROmorphone (DILAUDID) 2 MG tablet Take 1 tablet (2 mg total) by mouth every 6 (six) hours as needed for severe pain (pain score 7-10). 30 tablet 0   ondansetron (ZOFRAN-ODT) 8 MG disintegrating tablet Dissolve 1 tablet (8 mg total) by mouth every 8 (eight) hours as needed for nausea or vomiting. 20 tablet 2   pantoprazole (PROTONIX) 40 MG tablet Take 1 tablet (40 mg total) by mouth daily. 30 tablet 2    prochlorperazine (COMPAZINE) 5 MG tablet Take 1 tablet (5 mg total) by mouth every 6 (six) hours as needed for nausea or vomiting. 60 tablet 1   rosuvastatin (CRESTOR) 10 MG tablet Take 10 mg by mouth at bedtime.     scopolamine (TRANSDERM-SCOP) 1 MG/3DAYS Place 1 patch (1.5 mg total) onto the skin every 3 (three) days. 10 patch 12   traMADol (ULTRAM) 50 MG tablet Take 1-2 tablets (50-100 mg total) by mouth every 6 (six) hours as needed for moderate pain (pain score 4-6) or severe pain (pain score 7-10). 45 tablet 0   ZELBORAF 240 MG tablet TAKE 4 TABLETS BY MOUTH EVERY 12 HOURS WITH WATER. 224 tablet 0   No current facility-administered medications for this visit.   Facility-Administered Medications Ordered in Other Visits  Medication Dose Route Frequency Provider Last Rate Last Admin   0.9 %  sodium chloride infusion   Intravenous Continuous Melven Sartorius, MD 999 mL/hr at 02/19/23 1058 New Bag  at 02/19/23 1058   potassium chloride SA (KLOR-CON M) CR tablet 20 mEq  20 mEq Oral Q2H Melven Sartorius, MD   20 mEq at 02/19/23 1054    HISTORY OF PRESENT ILLNESS:   Oncology History  Melanoma metastatic to liver [facility])  11/16/2022 Imaging   She presented to the emergency room on 11/16/2022 for back pain x 2 weeks and central chest pain for several months   CTA CAP IMPRESSION: 1. Negative for aortic dissection or aneurysm. 2. 1.2 cm left upper lobe nodule.  For 3. Multiple ill-defined liver lesions, suspicious for metastatic disease. 4. Multiple lytic osseous lesions, suspicious for metastatic disease. Pathologic fracture of the superior endplate of L5   11/25/2022 Pathology Results   1. Liver, needle/core biopsy, Left hepatic lobe lesion :       - METASTATIC MELANOMA.    12/04/2022 Initial Diagnosis   Melanoma of skin (HCC) Liver, bone metastases. 1.2 cm LUL nodule   12/09/2022 PET scan   PET: Diffuse bone and liver metastases.  Lung metastases.    12/11/2022 -  Chemotherapy    Patient is on Treatment Plan : MELANOMA Nivolumab (1) + Ipilimumab (3) q21d / Nivolumab (480) q28d     12/17/2022 Imaging   MRI brain 1. Three small right hemisphere brain metastases metastases ranging from 2 mm to 4 mm. Minimal hemosiderin. No associated cerebral edema or mass effect. No other metastatic disease identified in the brain.   2. Known osseous metastatic disease but no destructive lesion identified in the skull or visible cervical spine.   3. Moderate for age cerebral white matter changes most commonly due to small vessel disease.       REVIEW OF SYSTEMS:   All relevant systems were reviewed with the patient and are negative.   VITALS:  Blood pressure 104/84, pulse (!) 130, temperature (!) 97.5 F (36.4 C), temperature source Temporal, resp. rate 18, weight 107 lb 12.8 oz (48.9 kg), SpO2 100%.  Wt Readings from Last 3 Encounters:  02/19/23 107 lb 12.8 oz (48.9 kg)  02/15/23 105 lb 6 oz (47.8 kg)  02/02/23 124 lb 14.4 oz (56.7 kg)    Body mass index is 19.1 kg/m.  Performance status (ECOG): 2 - Symptomatic, <50% confined to bed  PHYSICAL EXAM:   GENERAL: alert, no distress and comfortable SKIN: skin color normal, no rashes and less pale EYES: normal, sclera clear LUNGS: clear to auscultation with normal breathing effort.  No wheeze or rales HEART: regular rate & rhythm and no murmurs ABDOMEN: abdomen soft, non-tender and nondistended  LABORATORY DATA:  I have reviewed the data as listed    Component Value Date/Time   NA 137 02/19/2023 0851   K 2.3 (LL) 02/19/2023 0851   CL 96 (L) 02/19/2023 0851   CO2 29 02/19/2023 0851   GLUCOSE 120 (H) 02/19/2023 0851   BUN 10 02/19/2023 0851   CREATININE 1.09 (H) 02/19/2023 0851   CALCIUM 8.4 (L) 02/19/2023 0851   PROT 5.3 (L) 02/19/2023 0851   ALBUMIN 2.9 (L) 02/19/2023 0851   AST 51 (H) 02/19/2023 0851   ALT 26 02/19/2023 0851   ALKPHOS 363 (H) 02/19/2023 0851   BILITOT 1.3 (H) 02/19/2023 0851   GFRNONAA  60 (L) 02/19/2023 0851   GFRAA >60 04/20/2018 0823    No results found for: "SPEP", "UPEP"  Lab Results  Component Value Date   WBC 6.7 02/19/2023   NEUTROABS 4.7 02/19/2023   HGB 11.2 (L) 02/19/2023   HCT  33.2 (L) 02/19/2023   MCV 95.1 02/19/2023   PLT 313 02/19/2023      Chemistry      Component Value Date/Time   NA 137 02/19/2023 0851   K 2.3 (LL) 02/19/2023 0851   CL 96 (L) 02/19/2023 0851   CO2 29 02/19/2023 0851   BUN 10 02/19/2023 0851   CREATININE 1.09 (H) 02/19/2023 0851      Component Value Date/Time   CALCIUM 8.4 (L) 02/19/2023 0851   ALKPHOS 363 (H) 02/19/2023 0851   AST 51 (H) 02/19/2023 0851   ALT 26 02/19/2023 0851   BILITOT 1.3 (H) 02/19/2023 0851       RADIOGRAPHIC STUDIES: I have personally reviewed the radiological images as listed and agreed with the findings in the report. NM PET Image Restage (PS) Whole Body Result Date: 02/18/2023 CLINICAL DATA:  Subsequent treatment strategy for metastatic melanoma. History of brain metastasis EXAM: NUCLEAR MEDICINE PET WHOLE BODY TECHNIQUE: 5.2 mCi F-18 FDG was injected intravenously. Full-ring PET imaging was performed from the head to foot after the radiotracer. CT data was obtained and used for attenuation correction and anatomic localization. Fasting blood glucose: 79 mg/dl COMPARISON:  CT 69/67/8938 FINDINGS: HEAD/NECK: No hypermetabolic activity in the scalp. No hypermetabolic cervical lymph nodes. Incidental CT findings: none CHEST: The largest nodule in the lungs cysts in the LEFT upper lobe measuring 9 mm (image 77/5) compared to 16 mm on prior. There is some decrease in the solidity of the nodule with a rim of peripheral hypodensity. This nodule is very mildly metabolic with SUV max 1.9 which less background blood Smaller pulmonary nodules are not hypermetabolic. Hypermetabolic LEFT hilar lymph node with SUV max equal 3.5 c on image 82. No CT correlation. Incidental CT findings: none ABDOMEN/PELVIS: There is  intense metabolic activity associated with the large lesion in the LEFT hepatic lobe lateral segment with SUV max equal 7.3 on image 117. This lesion is evident as a high-density lesion measuring 3.7 cm on CT portion exam (image 116). T He multiple smaller high-density round lesions throughout the liver are less conspicuous on FDG PET imaging. One larger lesions in the medial RIGHT hepatic lobe measuring 2.2 cm on image 119) does have peripheral metabolic activity with SUV max 4.6. Incidental CT findings: none SKELETON: multiple lytic lesions within the spine and pelvis. Soft tissue component in the LEFT sacral ala has mild metabolic activity SUV max equal 3.9 on image 163. The majority of the lytic lesion of significant metabolic activity. Incidental CT findings: none EXTREMITIES: Incidental CT findings: none IMPRESSION: 1. The degree metastatic disease defined on recent CT scan is less impressive on FDG PET scan. 2. There is intense hypermetabolic lesion in the lateral segment of the LEFT hepatic lobe which corresponds to a large 3.7 cm mass. Additional small hepatic lesions have mild metabolic activity. 3. Very low metabolic activity associated with a LEFT upper lobe pulmonary nodule which is decreased in size. Favor positive response to therapy. 4. Small LEFT hilar hypermetabolic node is indeterminate. 5. Widespread lytic lesions throughout the axillary appendicular skeleton. Majority these lesions do not have hypermetabolic activity. One soft tissue lesion in the LEFT sacral ala does have mild metabolic activity. Electronically Signed   By: Genevive Bi M.D.   On: 02/18/2023 15:14

## 2023-02-18 NOTE — Assessment & Plan Note (Signed)
Marinol to 10 mg twice daily. New script sent

## 2023-02-18 NOTE — Assessment & Plan Note (Addendum)
Continue fentanyl patch Continue follow-up with palliative care Refer for evaluation for kyphoplasty

## 2023-02-19 ENCOUNTER — Inpatient Hospital Stay: Payer: BC Managed Care – PPO

## 2023-02-19 ENCOUNTER — Ambulatory Visit: Payer: BC Managed Care – PPO

## 2023-02-19 ENCOUNTER — Inpatient Hospital Stay: Payer: BC Managed Care – PPO | Admitting: Dietician

## 2023-02-19 ENCOUNTER — Inpatient Hospital Stay (HOSPITAL_BASED_OUTPATIENT_CLINIC_OR_DEPARTMENT_OTHER): Payer: BC Managed Care – PPO

## 2023-02-19 ENCOUNTER — Other Ambulatory Visit: Payer: Self-pay

## 2023-02-19 ENCOUNTER — Other Ambulatory Visit (HOSPITAL_COMMUNITY): Payer: Self-pay

## 2023-02-19 VITALS — BP 104/84 | HR 130 | Temp 97.5°F | Resp 18 | Wt 107.8 lb

## 2023-02-19 DIAGNOSIS — C7931 Secondary malignant neoplasm of brain: Secondary | ICD-10-CM

## 2023-02-19 DIAGNOSIS — C787 Secondary malignant neoplasm of liver and intrahepatic bile duct: Secondary | ICD-10-CM

## 2023-02-19 DIAGNOSIS — R110 Nausea: Secondary | ICD-10-CM

## 2023-02-19 DIAGNOSIS — R634 Abnormal weight loss: Secondary | ICD-10-CM | POA: Diagnosis not present

## 2023-02-19 DIAGNOSIS — R630 Anorexia: Secondary | ICD-10-CM

## 2023-02-19 DIAGNOSIS — Z79899 Other long term (current) drug therapy: Secondary | ICD-10-CM | POA: Diagnosis not present

## 2023-02-19 DIAGNOSIS — E876 Hypokalemia: Secondary | ICD-10-CM

## 2023-02-19 DIAGNOSIS — E860 Dehydration: Secondary | ICD-10-CM

## 2023-02-19 DIAGNOSIS — M545 Low back pain, unspecified: Secondary | ICD-10-CM | POA: Diagnosis not present

## 2023-02-19 DIAGNOSIS — Z79891 Long term (current) use of opiate analgesic: Secondary | ICD-10-CM | POA: Diagnosis not present

## 2023-02-19 DIAGNOSIS — K7689 Other specified diseases of liver: Secondary | ICD-10-CM | POA: Diagnosis not present

## 2023-02-19 DIAGNOSIS — C7800 Secondary malignant neoplasm of unspecified lung: Secondary | ICD-10-CM | POA: Diagnosis not present

## 2023-02-19 DIAGNOSIS — R1013 Epigastric pain: Secondary | ICD-10-CM | POA: Insufficient documentation

## 2023-02-19 DIAGNOSIS — F5089 Other specified eating disorder: Secondary | ICD-10-CM | POA: Diagnosis not present

## 2023-02-19 DIAGNOSIS — Z9189 Other specified personal risk factors, not elsewhere classified: Secondary | ICD-10-CM

## 2023-02-19 DIAGNOSIS — M8458XA Pathological fracture in neoplastic disease, other specified site, initial encounter for fracture: Secondary | ICD-10-CM

## 2023-02-19 DIAGNOSIS — I959 Hypotension, unspecified: Secondary | ICD-10-CM | POA: Diagnosis not present

## 2023-02-19 DIAGNOSIS — Z87891 Personal history of nicotine dependence: Secondary | ICD-10-CM | POA: Diagnosis not present

## 2023-02-19 DIAGNOSIS — G893 Neoplasm related pain (acute) (chronic): Secondary | ICD-10-CM

## 2023-02-19 DIAGNOSIS — C4359 Malignant melanoma of other part of trunk: Secondary | ICD-10-CM | POA: Diagnosis not present

## 2023-02-19 DIAGNOSIS — C7951 Secondary malignant neoplasm of bone: Secondary | ICD-10-CM | POA: Diagnosis not present

## 2023-02-19 LAB — LACTATE DEHYDROGENASE: LDH: 676 U/L — ABNORMAL HIGH (ref 98–192)

## 2023-02-19 LAB — CBC WITH DIFFERENTIAL (CANCER CENTER ONLY)
Abs Immature Granulocytes: 0.03 10*3/uL (ref 0.00–0.07)
Basophils Absolute: 0 10*3/uL (ref 0.0–0.1)
Basophils Relative: 0 %
Eosinophils Absolute: 0.2 10*3/uL (ref 0.0–0.5)
Eosinophils Relative: 2 %
HCT: 33.2 % — ABNORMAL LOW (ref 36.0–46.0)
Hemoglobin: 11.2 g/dL — ABNORMAL LOW (ref 12.0–15.0)
Immature Granulocytes: 0 %
Lymphocytes Relative: 22 %
Lymphs Abs: 1.5 10*3/uL (ref 0.7–4.0)
MCH: 32.1 pg (ref 26.0–34.0)
MCHC: 33.7 g/dL (ref 30.0–36.0)
MCV: 95.1 fL (ref 80.0–100.0)
Monocytes Absolute: 0.3 10*3/uL (ref 0.1–1.0)
Monocytes Relative: 5 %
Neutro Abs: 4.7 10*3/uL (ref 1.7–7.7)
Neutrophils Relative %: 71 %
Platelet Count: 313 10*3/uL (ref 150–400)
RBC: 3.49 MIL/uL — ABNORMAL LOW (ref 3.87–5.11)
RDW: 20.1 % — ABNORMAL HIGH (ref 11.5–15.5)
WBC Count: 6.7 10*3/uL (ref 4.0–10.5)
nRBC: 0.3 % — ABNORMAL HIGH (ref 0.0–0.2)

## 2023-02-19 LAB — CMP (CANCER CENTER ONLY)
ALT: 26 U/L (ref 0–44)
AST: 51 U/L — ABNORMAL HIGH (ref 15–41)
Albumin: 2.9 g/dL — ABNORMAL LOW (ref 3.5–5.0)
Alkaline Phosphatase: 363 U/L — ABNORMAL HIGH (ref 38–126)
Anion gap: 12 (ref 5–15)
BUN: 10 mg/dL (ref 6–20)
CO2: 29 mmol/L (ref 22–32)
Calcium: 8.4 mg/dL — ABNORMAL LOW (ref 8.9–10.3)
Chloride: 96 mmol/L — ABNORMAL LOW (ref 98–111)
Creatinine: 1.09 mg/dL — ABNORMAL HIGH (ref 0.44–1.00)
GFR, Estimated: 60 mL/min — ABNORMAL LOW (ref >60)
Glucose, Bld: 120 mg/dL — ABNORMAL HIGH (ref 70–99)
Potassium: 2.3 mmol/L — CL (ref 3.5–5.1)
Sodium: 137 mmol/L (ref 135–145)
Total Bilirubin: 1.3 mg/dL — ABNORMAL HIGH (ref 0.0–1.2)
Total Protein: 5.3 g/dL — ABNORMAL LOW (ref 6.5–8.1)

## 2023-02-19 MED ORDER — SODIUM CHLORIDE 0.9 % IV SOLN
INTRAVENOUS | Status: DC
Start: 2023-02-19 — End: 2023-02-19

## 2023-02-19 MED ORDER — ONDANSETRON HCL 4 MG/2ML IJ SOLN
8.0000 mg | Freq: Once | INTRAMUSCULAR | Status: AC
  Administered 2023-02-19: 8 mg via INTRAVENOUS
  Filled 2023-02-19: qty 4

## 2023-02-19 MED ORDER — POTASSIUM CHLORIDE CRYS ER 20 MEQ PO TBCR
20.0000 meq | EXTENDED_RELEASE_TABLET | Freq: Once | ORAL | Status: DC
Start: 2023-02-19 — End: 2023-02-19

## 2023-02-19 MED ORDER — POTASSIUM CHLORIDE 20 MEQ PO PACK: 20.0000 meq | PACK | Freq: Two times a day (BID) | ORAL | Status: DC

## 2023-02-19 MED ORDER — POTASSIUM CHLORIDE 40 MEQ/15ML (20%) PO SOLN
20.0000 meq | Freq: Two times a day (BID) | ORAL | 0 refills | Status: DC
  Filled 2023-02-19: qty 240, 16d supply, fill #0

## 2023-02-19 MED ORDER — POTASSIUM CHLORIDE CRYS ER 20 MEQ PO TBCR
20.0000 meq | EXTENDED_RELEASE_TABLET | ORAL | Status: AC
  Administered 2023-02-19 (×2): 20 meq via ORAL
  Filled 2023-02-19 (×2): qty 1

## 2023-02-19 NOTE — Progress Notes (Signed)
Nutrition Follow-up:  Pt with malignant melanoma metastatic to brain, liver, bone.  Patient previously on immunotherapy but recent hospitalization.  Treatment changed to vermurafenib and cobimetinib.    Met with pt and husband in [facility]. She is receiving IV fluids and electrolytes. Patient reports nausea much improved with scopolamine patch. Her appetite is slowly picking up with appetite stimulant. Last couple days has tolerated pintos, half grilled cheese, popsicle, queso/chips. She is drinking CIB with lactaid milk.    Medications: reviewed   Labs: K 2.3, Cr 1.09, albumin 2.9  Anthropometrics: Wt 107 lb 12.8 oz today slightly increased from 105 lb 6 oz on 1/20   Estimated Energy Needs  Kcals: 1550-1800 Protein: 74-93 Fluid: >1.5 L  NUTRITION DIAGNOSIS: Inadequate oral intake slowly improving   INTERVENTION:  Encouraged 5-6 small meals with adequate calories and protein - snack ideas, shake recipes provided  Continue drinking CIB, suggested switching to fairlife whole milk for added calories/protein Educated on sources of potassium - handout provided  Continue appetite stimulant + scopolamine  Contact information given   MONITORING, EVALUATION, GOAL: wt trends, intake   NEXT VISIT: Monday February 10 during infusion

## 2023-02-19 NOTE — Progress Notes (Deleted)
zofra

## 2023-02-19 NOTE — Assessment & Plan Note (Addendum)
20 mEq of PO potassium chloride before and after IV fluid today.  Prescribed potassium chloride 20 mEq liquid twice daily to [employer].  May start today  Repeat labs on Tuesday morning.

## 2023-02-19 NOTE — Assessment & Plan Note (Signed)
EKG on 12/30 and then montly x3 months  Monitor for diarrhea, nausea, rash, MSK pain, visual changes Monitor LFT Monitor for irAEs

## 2023-02-19 NOTE — Addendum Note (Signed)
Addended by: Merlene Laughter B on: 02/19/2023 10:30 AM   Modules accepted: Orders

## 2023-02-19 NOTE — Patient Instructions (Signed)
Hypokalemia Hypokalemia means that the amount of potassium in the blood is lower than normal. Potassium is a mineral (electrolyte) that helps regulate the amount of fluid in the body. It also stimulates muscle tightening (contraction) and helps nerves work properly. Normally, most of the body's potassium is inside cells, and only a very small amount is in the blood. Because the amount in the blood is so small, minor changes to potassium levels in the blood can be life-threatening. What are the causes? This condition may be caused by: Antibiotic medicine. Diarrhea or vomiting. Taking too much of a medicine that helps you have a bowel movement (laxative) can cause diarrhea and lead to hypokalemia. Chronic kidney disease (CKD). Medicines that help the body get rid of excess fluid (diuretics). Eating disorders, such as anorexia or bulimia. Low magnesium levels in the body. Sweating a lot. What are the signs or symptoms? Symptoms of this condition include: Weakness. Constipation. Fatigue. Muscle cramps. Mental confusion. Skipped heartbeats or irregular heartbeat (palpitations). Tingling or numbness. How is this diagnosed? This condition is diagnosed with a blood test. How is this treated? This condition may be treated by: Taking potassium supplements. Adjusting the medicines that you take. Eating more foods that contain a lot of potassium. If your potassium level is very low, you may need to get potassium through an IV and be monitored in the hospital. Follow these instructions at home: Eating and drinking  Eat a healthy diet. A healthy diet includes fresh fruits and vegetables, whole grains, healthy fats, and lean proteins. If told, eat more foods that contain a lot of potassium. These include: Nuts, such as peanuts and pistachios. Seeds, such as sunflower seeds and pumpkin seeds. Peas, lentils, and lima beans. Whole grain and bran cereals and breads. Fresh fruits and vegetables,  such as apricots, avocado, bananas, cantaloupe, kiwi, oranges, tomatoes, asparagus, and potatoes. Juices, such as orange, tomato, and prune. Lean meats, including fish. Milk and milk products, such as yogurt. General instructions Take over-the-counter and prescription medicines only as told by your health care provider. This includes vitamins, natural food products, and supplements. Keep all follow-up visits. This is important. Contact a health care provider if: You have weakness that gets worse. You feel your heart pounding or racing. You vomit. You have diarrhea. You have diabetes and you have trouble keeping your blood sugar in your target range. Get help right away if: You have chest pain. You have shortness of breath. You have vomiting or diarrhea that lasts for more than 2 days. You faint. These symptoms may be an emergency. Get help right away. Call 911. Do not wait to see if the symptoms will go away. Do not drive yourself to the hospital. Summary Hypokalemia means that the amount of potassium in the blood is lower than normal. This condition is diagnosed with a blood test. Hypokalemia may be treated by taking potassium supplements, adjusting the medicines that you take, or eating more foods that are high in potassium. If your potassium level is very low, you may need to get potassium through an IV and be monitored in the hospital. This information is not intended to replace advice given to you by your health care provider. Make sure you discuss any questions you have with your health care provider. Document Revised: 09/26/2020 Document Reviewed: 09/26/2020 Elsevier Patient Education  2024 [employer]Rehydration, Adult Rehydration is the replacement of fluids, salts, and minerals in the body (electrolytes) that are lost during dehydration. Dehydration is when there is not  enough water or other fluids in the body. This happens when you lose more fluids than you take in. Common  causes of dehydration include: Not drinking enough fluids. This can occur when you are ill or doing activities that require a lot of energy, especially in hot weather. Conditions that cause loss of water or other fluids. These include diarrhea, vomiting, sweating, and urinating a lot. Other illnesses, such as fever or infection. Certain medicines, such as those that remove excess fluid from the body (diuretics). Symptoms of mild or moderate dehydration may include thirst, dry lips and mouth, and dizziness. Symptoms of severe dehydration may include increased heart rate, confusion, fainting, and not urinating. In severe cases, you may need to get fluids through an IV at the hospital. For mild or moderate cases, you can usually rehydrate at home by drinking certain fluids as told by your health care provider. What are the risks? Your health care provider will talk with you about risks. Your health care provider will talk with you about risks. This may include taking in too much fluid (overhydration). This is rare. Overhydration can cause an imbalance of electrolytes in the body, kidney failure, or a decrease in salt (sodium) levels in the body. Supplies needed: You will need an oral rehydration solution (ORS) if your health care provider tells you to use one. This is a drink to treat dehydration. It can be found in pharmacies and retail stores. How to rehydrate Fluids Follow instructions from your health care provider about what to drink. The kind of fluid and the amount you should drink depend on your condition. In general, you should choose drinks that you prefer. If told by your health care provider, drink an ORS. Make an ORS by following instructions on the package. Start by drinking small amounts, about  cup (120 mL) every 5-10 minutes. Slowly increase how much you drink until you have taken in the amount recommended by your health care provider. Drink enough clear fluids to keep your urine  pale yellow. If you were told to drink an ORS, finish it first, then start slowly drinking other clear fluids. Drink fluids such as: Water. This includes sparkling and flavored water. Drinking only water can lead to having too little sodium in your body (hyponatremia). Follow the advice of your health care provider. Water from ice chips you suck on. Fruit juice with water added to it (diluted). Sports drinks. Hot or cold herbal teas. Broth-based soups. Milk or milk products. Food Follow instructions from your health care provider about what to eat while you rehydrate. Your health care provider may recommend that you slowly begin eating regular foods in small amounts. Eat foods that contain a healthy balance of electrolytes, such as bananas, oranges, potatoes, tomatoes, and spinach. Avoid foods that are greasy or contain a lot of sugar. In some cases, you may get nutrition through a feeding tube that is passed through your nose and into your stomach (nasogastric tube, or NG tube). This may be done if you have uncontrolled vomiting or diarrhea. Drinks to avoid  Certain drinks may make dehydration worse. While you rehydrate, avoid drinking alcohol. How to tell if you are recovering from dehydration You may be getting better if: You are urinating more often than before you started rehydrating. Your urine is pale yellow. Your energy level improves. You vomit less often. You have diarrhea less often. Your appetite improves or returns to normal. You feel less dizzy or light-headed. Your skin tone and color start  to look more normal. Follow these instructions at home: Take over-the-counter and prescription medicines only as told by your health care provider. Do not take sodium tablets. Doing this can lead to having too much sodium in your body (hypernatremia). Contact a health care provider if: You continue to have symptoms of mild or moderate dehydration, such as: Thirst. Dry lips. Slightly  dry mouth. Dizziness. Dark urine or less urine than normal. Muscle cramps. You continue to vomit or have diarrhea. Get help right away if: You have symptoms of dehydration that get worse. You have a fever. You have a severe headache. You have been vomiting and have problems, such as: Your vomiting gets worse or does not go away. Your vomit includes blood or green matter (bile). You cannot eat or drink without vomiting. You have problems with urination or bowel movements, such as: Diarrhea that gets worse or does not go away. Blood in your stool (feces). This may cause stool to look black and tarry. Not urinating, or urinating only a small amount of very dark urine, within 6-8 hours. You have trouble breathing. You have symptoms that get worse with treatment. These symptoms may be an emergency. Get help right away. Call 911. Do not wait to see if the symptoms will go away. Do not drive yourself to the hospital. This information is not intended to replace advice given to you by your health care provider. Make sure you discuss any questions you have with your health care provider. Document Revised: 05/26/2021 Document Reviewed: 05/26/2021 Elsevier Patient Education  2024 [employer].

## 2023-02-19 NOTE — Assessment & Plan Note (Signed)
Will use Pepcid 20 mg twice daily instead of PPI.

## 2023-02-22 ENCOUNTER — Telehealth: Payer: Self-pay

## 2023-02-22 ENCOUNTER — Other Ambulatory Visit: Payer: Self-pay | Admitting: *Deleted

## 2023-02-22 DIAGNOSIS — E860 Dehydration: Secondary | ICD-10-CM

## 2023-02-22 NOTE — Progress Notes (Signed)
NS

## 2023-02-23 ENCOUNTER — Telehealth: Payer: Self-pay

## 2023-02-23 ENCOUNTER — Inpatient Hospital Stay: Payer: BC Managed Care – PPO

## 2023-02-23 VITALS — BP 124/72 | HR 102 | Temp 98.8°F | Resp 18

## 2023-02-23 DIAGNOSIS — C7951 Secondary malignant neoplasm of bone: Secondary | ICD-10-CM

## 2023-02-23 DIAGNOSIS — C7931 Secondary malignant neoplasm of brain: Secondary | ICD-10-CM | POA: Diagnosis not present

## 2023-02-23 DIAGNOSIS — R1013 Epigastric pain: Secondary | ICD-10-CM | POA: Diagnosis not present

## 2023-02-23 DIAGNOSIS — E860 Dehydration: Secondary | ICD-10-CM

## 2023-02-23 DIAGNOSIS — F5089 Other specified eating disorder: Secondary | ICD-10-CM | POA: Diagnosis not present

## 2023-02-23 DIAGNOSIS — E876 Hypokalemia: Secondary | ICD-10-CM | POA: Diagnosis not present

## 2023-02-23 DIAGNOSIS — Z79899 Other long term (current) drug therapy: Secondary | ICD-10-CM | POA: Diagnosis not present

## 2023-02-23 DIAGNOSIS — M545 Low back pain, unspecified: Secondary | ICD-10-CM | POA: Diagnosis not present

## 2023-02-23 DIAGNOSIS — C7800 Secondary malignant neoplasm of unspecified lung: Secondary | ICD-10-CM | POA: Diagnosis not present

## 2023-02-23 DIAGNOSIS — R634 Abnormal weight loss: Secondary | ICD-10-CM | POA: Diagnosis not present

## 2023-02-23 DIAGNOSIS — C4359 Malignant melanoma of other part of trunk: Secondary | ICD-10-CM | POA: Diagnosis not present

## 2023-02-23 DIAGNOSIS — I959 Hypotension, unspecified: Secondary | ICD-10-CM | POA: Diagnosis not present

## 2023-02-23 DIAGNOSIS — G893 Neoplasm related pain (acute) (chronic): Secondary | ICD-10-CM | POA: Diagnosis not present

## 2023-02-23 DIAGNOSIS — Z87891 Personal history of nicotine dependence: Secondary | ICD-10-CM | POA: Diagnosis not present

## 2023-02-23 DIAGNOSIS — Z79891 Long term (current) use of opiate analgesic: Secondary | ICD-10-CM | POA: Diagnosis not present

## 2023-02-23 DIAGNOSIS — C787 Secondary malignant neoplasm of liver and intrahepatic bile duct: Secondary | ICD-10-CM | POA: Diagnosis not present

## 2023-02-23 LAB — BASIC METABOLIC PANEL - CANCER CENTER ONLY
Anion gap: 6 (ref 5–15)
BUN: 10 mg/dL (ref 6–20)
CO2: 28 mmol/L (ref 22–32)
Calcium: 8.1 mg/dL — ABNORMAL LOW (ref 8.9–10.3)
Chloride: 101 mmol/L (ref 98–111)
Creatinine: 1.04 mg/dL — ABNORMAL HIGH (ref 0.44–1.00)
GFR, Estimated: >60 mL/min (ref >60)
Glucose, Bld: 121 mg/dL — ABNORMAL HIGH (ref 70–99)
Potassium: 3 mmol/L — ABNORMAL LOW (ref 3.5–5.1)
Sodium: 135 mmol/L (ref 135–145)

## 2023-02-23 LAB — MAGNESIUM: Magnesium: 2.4 mg/dL (ref 1.7–2.4)

## 2023-02-23 MED ORDER — KETOROLAC TROMETHAMINE 30 MG/ML IJ SOLN
15.0000 mg | Freq: Once | INTRAMUSCULAR | Status: AC
  Administered 2023-02-23: 15 mg via INTRAVENOUS
  Filled 2023-02-23: qty 1

## 2023-02-23 MED ORDER — SODIUM CHLORIDE 0.9 % IV SOLN: INTRAVENOUS | Status: AC

## 2023-02-23 NOTE — Addendum Note (Signed)
Addended by: Geanie Berlin on: 02/23/2023 10:23 AM   Modules accepted: Orders

## 2023-02-23 NOTE — Telephone Encounter (Signed)
TC from [place], pt's Nurse Case Manager at [facility], requesting recent OV note and PET scan. Per pt, okay to release this information. Last OV note by Dr. Cherly Hensen and most recent PET scan faxed to [phone].

## 2023-02-23 NOTE — Patient Instructions (Signed)

## 2023-02-25 ENCOUNTER — Telehealth: Payer: Self-pay

## 2023-02-25 ENCOUNTER — Other Ambulatory Visit: Payer: Self-pay

## 2023-02-25 DIAGNOSIS — C7951 Secondary malignant neoplasm of bone: Secondary | ICD-10-CM

## 2023-02-25 MED ORDER — COTELLIC 20 MG PO TABS: ORAL_TABLET | ORAL | 0 refills | Status: DC

## 2023-02-25 MED ORDER — ZELBORAF 240 MG PO TABS: 960.0000 mg | ORAL_TABLET | Freq: Two times a day (BID) | ORAL | 0 refills | Status: DC

## 2023-02-25 NOTE — Progress Notes (Signed)
Refilled Cotellic and Zelboraf.

## 2023-02-25 NOTE — Telephone Encounter (Signed)
Received VM from pt's husband, asking if pt needs to continue taking Cotellic and Zelboraf since Dr. Cherly Hensen had discussed changing to immunotherapy treatment. He states pt only has a few doses of these meds left. Consulted Dr. Cherly Hensen, and he states that pt will need to continue these for now and ordered refills. Pt's husband notified.

## 2023-02-26 ENCOUNTER — Other Ambulatory Visit (HOSPITAL_COMMUNITY): Payer: Self-pay

## 2023-02-26 ENCOUNTER — Other Ambulatory Visit: Payer: Self-pay

## 2023-02-26 ENCOUNTER — Other Ambulatory Visit: Payer: Self-pay | Admitting: Nurse Practitioner

## 2023-02-26 ENCOUNTER — Telehealth: Payer: Self-pay

## 2023-02-26 DIAGNOSIS — Z515 Encounter for palliative care: Secondary | ICD-10-CM

## 2023-02-26 DIAGNOSIS — R110 Nausea: Secondary | ICD-10-CM

## 2023-02-26 DIAGNOSIS — C7951 Secondary malignant neoplasm of bone: Secondary | ICD-10-CM

## 2023-02-26 MED ORDER — METOCLOPRAMIDE HCL 10 MG PO TABS
10.0000 mg | ORAL_TABLET | Freq: Three times a day (TID) | ORAL | 1 refills | Status: DC
  Filled 2023-02-26: qty 90, 30d supply, fill #0

## 2023-02-26 MED ORDER — PROMETHAZINE HCL 12.5 MG RE SUPP
12.5000 mg | Freq: Two times a day (BID) | RECTAL | 0 refills | Status: DC | PRN
  Filled 2023-02-26: qty 12, 6d supply, fill #0

## 2023-02-26 NOTE — Telephone Encounter (Signed)
Pt sent in MyChart messages reporting uncontrolled nausea, RN called and spoke with pt and husband, educated on mew medication usage and how to maximize medications already on hand. Verbalized understanding. No further needs at this time.

## 2023-02-26 NOTE — Progress Notes (Unsigned)
Palliative [name]  [phone] [phone]   Name: Laura [name] Date: 02/26/2023 MRN: [id number]  DOB: [birth year]  Patient Care Team: Laura Soho, [name] as PCP - General (Family [name]) Pickenpack-Cousar, Laura Baumgartner, NP as Nurse Practitioner Laura [name])    INTERVAL HISTORY: Laura [name] is a 59 y.o. female with oncologic medical history including malignant melanoma (10/2022) with metastatic disease to the bone. As well as a history of hypertension, hyperlipidemia, GERD, and arthritis. Palliative ask to see for symptom management and goals of care.   SOCIAL HISTORY:     reports that she quit smoking about 7 years ago. Her smoking use included cigarettes. She started smoking about 27 years ago. She has a 20 pack-year smoking history. She has never used smokeless tobacco. She reports that she does not drink alcohol and does not use drugs.  ADVANCE DIRECTIVES:  None on file  CODE STATUS: Full code  PAST MEDICAL HISTORY: Past Medical History:  Diagnosis Date   Anxiety    Arthritis    Complication of anesthesia    Fibromyalgia    Gallstones 04/20/2018   GERD (gastroesophageal reflux disease)    History of blood transfusion    post Hysterectomy   Hypercholesteremia    Melanoma (HCC)    on back and excised   Migraine    PONV (postoperative nausea and vomiting)    Tobacco abuse    Vertebral artery disease (HCC)    history of left vertebral artery dissection 2011    ALLERGIES:  is allergic to aspirin.  MEDICATIONS:  Current Outpatient Medications  Medication Sig Dispense Refill   AMBIEN CR 6.25 MG CR tablet Take 6.25 mg by mouth at bedtime.     cobimetinib fumarate (COTELLIC) 20 MG tablet Take 60 mg (3 tabs) daily on days 1-21. Repeat every 28 days. Avoid sun exposure. 63 tablet 0   docusate sodium (COLACE) 100 MG capsule Take 100-200 mg by mouth See admin instructions. 200 mg in the  morning, 100 mg at bedtime     dronabinol (MARINOL) 10 MG capsule Take 1 capsule (10 mg total) by mouth 3 (three) times daily. (Patient taking differently: Take 10 mg by mouth 2 (two) times daily before a meal.) 90 capsule 0   famotidine (PEPCID) 20 MG tablet Take 20 mg by mouth 2 (two) times daily.     fentaNYL (DURAGESIC) 12 MCG/HR Place 1 patch onto the skin every 3 (three) days. 10 patch 0   gabapentin (NEURONTIN) 100 MG capsule Take 1 capsule (100 mg total) by mouth 2 (two) times daily. (Patient taking differently: Take 100 mg by mouth at bedtime.) 60 capsule 0   metoCLOPramide (REGLAN) 10 MG tablet Take 1 tablet (10 mg total) by mouth 3 (three) times daily before meals. 90 tablet 1   ondansetron (ZOFRAN-ODT) 8 MG disintegrating tablet Dissolve 1 tablet (8 mg total) by mouth every 8 (eight) hours as needed for nausea or vomiting. 20 tablet 2   pantoprazole (PROTONIX) 40 MG tablet Take 1 tablet (40 mg total) by mouth daily. (Patient not taking: Reported on 02/26/2023) 30 tablet 2   potassium chloride SA (KLOR-CON M) 20 MEQ tablet Take 40 mEq by mouth 2 (two) times daily.     prochlorperazine (COMPAZINE) 5 MG tablet Take 1 tablet (5 mg total) by mouth every 6 (six) hours as needed for nausea or vomiting. 60 tablet 1   rosuvastatin (CRESTOR) 10 MG tablet Take 10 mg  by mouth at bedtime.     scopolamine (TRANSDERM-SCOP) 1 MG/3DAYS Place 1 patch (1.5 mg total) onto the skin every 3 (three) days. 10 patch 12   traMADol (ULTRAM) 50 MG tablet Take 1-2 tablets (50-100 mg total) by mouth every 6 (six) hours as needed for moderate pain (pain score 4-6) or severe pain (pain score 7-10). 45 tablet 0   vemurafenib (ZELBORAF) 240 MG tablet Take 4 tablets (960 mg total) by mouth every 12 (twelve) hours. Take with water. 224 tablet 0   No current facility-administered medications for this visit.    VITAL SIGNS: There were no vitals taken for this visit. There were no vitals filed for this visit.  Estimated  body mass index is 19.1 kg/m as calculated from the following:   Height as of 01/26/23: 5\' 3"  (1.6 m).   Weight as of 02/19/23: 107 lb 12.8 oz (48.9 kg).   PERFORMANCE STATUS (ECOG) : 1 - Symptomatic but completely ambulatory   Physical Exam General: Fatigued, in recliner Cardiovascular: regular rate and rhythm Pulmonary: normal breathing pattern Neurological: AAO x3  Discussed the use of AI scribe software for clinical note transcription with the patient, who gave verbal consent to proceed.   IMPRESSION:  I saw Laura [name] during her infusion. Husband present. She continues to take things one day at a time. Recently ate a burger from [facility]. Discussed hopes of keeping it down.   She experiences persistent nausea despite being on multiple antiemetic medications, including Pepcid, Reglan three times a day, Zofran, Compazine, and a Scopolamine patch. The Scopolamine patch was initially effective but has since lost its efficacy, although she continues to change it regularly. She has not taken her morning medication today due to an early appointment.   Laura [name] was seen by Dr. Cherly Hensen today. Protonix started. We discussed ongoing management of her nausea. Recommended also increasing Reglan to four times daily if possible. We will plan to continue closely monitoring and touch base later in the week for support.   She experiences pain, which was alleviated for about a month after a particular intervention, allowing her to walk better for a few days. She describes this relief as a 'magic touch'. Much appreciative of the improvement in her pain. We will continue with current regimen Fentanyl patch, tramadol 1-2 tablets as needed for breakthrough pain, and gabapentin twice daily. No changes at this time.   She is under palliative care and has been informed that her insurance, [employer], may not cover frequent home health aid visits, which are currently limited to once every three to four weeks.  She expresses concern about losing her insurance and mentions the need for husband to return to work, having been out since October. We will attempt to see if insurance will cover any form of home health support.   All questions answered and support provided.  Assessment and Plan  Nausea Persistent despite multiple antiemetics (Reglan, Zofran, Compazine, Scopolamine patch). Recently started on Pepcid by Dr. Cherly Hensen. -Increase Reglan to four times a day. -Continue Pepcid as prescribed by Dr. Cherly Hensen. -Check-in later this week to assess response to increased Reglan dose.  Pain Reports improvement in pain after previous intervention. -Continue current pain management regimen. -Fentanyl patch -Tramadol 50-100mg  for breakthrough -Gabapentin 100 mg twice daily  Home Health Discussed the possibility of home health services, but insurance coverage is uncertain. -Explore the possibility of home health services with [employer].  Cancer Treatment Scheduled for IV fluids, Pepcid, and Nivolumab  today. -Continue with scheduled treatment today.  Follow-up Plan to touch base on later this week to assess response to increased Reglan dose. -I will plan to see patient back in 2-3 weeks. Sooner if needed.  I discussed the importance of continued conversation with family and their medical providers regarding overall plan of care and treatment options, ensuring decisions are within the context of the patients values and GOCs.  Patient expressed understanding and was in agreement with this plan. She also understands that She can call the clinic at any time with any questions, concerns, or complaints.   Any controlled substances utilized were prescribed in the context of palliative care. PDMP has been reviewed.   Visit consisted of counseling and education dealing with the complex and emotionally intense issues of symptom management and palliative care in the setting of serious and potentially  life-threatening illness.  Willette Alma, AGPCNP-BC  Palliative [name] Team/[facility]

## 2023-02-28 NOTE — Assessment & Plan Note (Signed)
LFT improved. PET showed response. Continue monitor. Continue vemurafenib 960 mg twice daily, and cobimetinib 60 mg once daily. Will proceed with cycle 3 Refill of cycle 4 PET scan about end of March CBC, CMP, LDH on  with follow up Will resume nivolumab

## 2023-02-28 NOTE — Assessment & Plan Note (Signed)
Asymptomatic  Scheduled for MRI on every 6

## 2023-02-28 NOTE — Assessment & Plan Note (Signed)
benefiber On Miralax as needed and soft softener.  Increased fluid.

## 2023-02-28 NOTE — Assessment & Plan Note (Signed)
potassium chloride 20 mEq twice daily    Repeat labs in 4 weeks.

## 2023-02-28 NOTE — Progress Notes (Unsigned)
Patient Care Team: [name], [name] as PCP - General (Family Medicine) Pickenpack-Cousar, Arty Baumgartner, NP as Nurse Practitioner [facility])  Clinic Day:  03/01/2023  Referring physician: Melven Sartorius, MD  ASSESSMENT & PLAN:   Assessment & Plan: Diagnosis: 11/25/2022. Diffuse liver, bone, lung metastases. Brain metastases after starting systemic treatment Treatment: 12/11/2022. First line ipi/nivo. Completed 2 cycles. 12/14/22-12/28/22 radiation to L-spine 30Gy/28fx 12/14  vemurafenib and cobimetinib due to disease uncontrolled or worsening symptoms, liver metastases   Due to sign of clinical progression of liver metastases, new abdominal pain, loss of appetite, changed to BRAF/MEC inhibitors for disease control. Discussed potential side effects. Her insurance only authorized vemurafenib and cobimetinib so she started on 12/14. Clinically stable. LFT improved again today.  Interval PET/CT showed response.  Overall disease burden had decreased.  We discussed risk and benefit of any immunotherapy.  Potential severe side effects can occur.  After discussion, we will resume nivolumab and vemurafenib and cobimetinib.  She is afraid of disease progression.  Discussed we will continue treatment and restaging about end of March again.  She has clinical improvement over the last few weeks.  She is on minimal pain medication and is ambulating around the house more.  She will continue palliative care support.  We discussed the difference between palliative care and hospice again today.  She will talk to [place].  She has appointment with orthopedic evaluation for kyphoplasty for back pain.   Will need to continue monitor closely for liver failure with labs and clinical exam.  Melanoma metastatic to liver [facility]) LFT improved. PET showed response. Continue monitor. Continue vemurafenib 960 mg twice daily, and cobimetinib 60 mg once daily. Will proceed with cycle 3 Refill of  cycle 4 PET scan about end of March CBC, CMP, LDH on  with follow up Will resume nivolumab  Metastatic cancer to brain [facility]) Asymptomatic  Scheduled for MRI on every 6  Constipation benefiber On Miralax as needed and soft softener.  Increased fluid.  Hypokalemia potassium chloride 20 mEq twice daily    Repeat labs in 4 weeks.  Decreased oral intake Continue Marinol 10 mg twice daily. Prescription sent to [facility] pharmacy at [facility] for daily IV fluid in the infusion room this week and next week as needed.  Nausea with vomiting Compazine, zofran, reglan, scopolamine did not work. Will resume famotidine 20 mg twice daily. Give one dose at infusion today.  At risk for side effect of medication LFTs later this week and then every 2 weeks  EKG on 12/30 and then montly x3 months  Monitor for diarrhea, nausea, rash, MSK pain, visual changes Monitor for irAEs    Goals of care at this discussed.  Treatment is palliative which she and her husband had been informed in the past.  Given her aggressive disease, continue current treatment and reassess every 2 weeks.  Total time 56 minutes.  The patient understands the plans discussed today and is in agreement with them.  She knows to contact our office if she develops concerns prior to her next appointment.  Melven Sartorius, MD  [name] - A DEPT OF [facility] [address] [place] [address] Dept: [phone] Dept Fax: [phone]     CHIEF COMPLAINT:  CC: stage IV melanoma  Current Treatment:  vemurafenib and cobimetinib  INTERVAL HISTORY:  Laura [name] is here today for repeat clinical assessment. She takes  tramadol one in the morning and one at night and gabapentin two at night.  She was able to move her bowel last night and a few days before. She still has nausea and not related to taking her cancer meds. She started Reglan  and did not help. She tried scopolamine and still has nausea, some vomiting. No fever, chills or stomach pain. No chest pain, trouble urinating.  No new neurologic symptoms or headache, focal weakness.  She is taking marinol. Her appetite is better.  I have reviewed the past medical history, past surgical history, social history and family history with the patient and they are unchanged from previous note.  ALLERGIES:  is allergic to aspirin.  MEDICATIONS:  Current Outpatient Medications  Medication Sig Dispense Refill   AMBIEN CR 6.25 MG CR tablet Take 6.25 mg by mouth at bedtime.     cobimetinib fumarate (COTELLIC) 20 MG tablet Take 60 mg (3 tabs) daily on days 1-21. Repeat every 28 days. Avoid sun exposure. 63 tablet 0   docusate sodium (COLACE) 100 MG capsule Take 100-200 mg by mouth See admin instructions. 200 mg in the morning, 100 mg at bedtime     dronabinol (MARINOL) 10 MG capsule Take 1 capsule (10 mg total) by mouth 3 (three) times daily. (Patient taking differently: Take 10 mg by mouth 2 (two) times daily before a meal.) 90 capsule 0   famotidine (PEPCID) 20 MG tablet Take 20 mg by mouth 2 (two) times daily.     fentaNYL (DURAGESIC) 12 MCG/HR Place 1 patch onto the skin every 3 (three) days. 10 patch 0   gabapentin (NEURONTIN) 100 MG capsule Take 1 capsule (100 mg total) by mouth 2 (two) times daily. (Patient taking differently: Take 100 mg by mouth at bedtime.) 60 capsule 0   metoCLOPramide (REGLAN) 10 MG tablet Take 1 tablet (10 mg total) by mouth 3 (three) times daily before meals. 90 tablet 1   ondansetron (ZOFRAN-ODT) 8 MG disintegrating tablet Dissolve 1 tablet (8 mg total) by mouth every 8 (eight) hours as needed for nausea or vomiting. 20 tablet 2   potassium chloride SA (KLOR-CON M) 20 MEQ tablet Take 40 mEq by mouth 2 (two) times daily.     prochlorperazine (COMPAZINE) 5 MG tablet Take 1 tablet (5 mg total) by mouth every 6 (six) hours as needed for nausea or vomiting. 60  tablet 1   rosuvastatin (CRESTOR) 10 MG tablet Take 10 mg by mouth at bedtime.     scopolamine (TRANSDERM-SCOP) 1 MG/3DAYS Place 1 patch (1.5 mg total) onto the skin every 3 (three) days. 10 patch 12   traMADol (ULTRAM) 50 MG tablet Take 1-2 tablets (50-100 mg total) by mouth every 6 (six) hours as needed for moderate pain (pain score 4-6) or severe pain (pain score 7-10). 45 tablet 0   vemurafenib (ZELBORAF) 240 MG tablet Take 4 tablets (960 mg total) by mouth every 12 (twelve) hours. Take with water. 224 tablet 0   No current facility-administered medications for this visit.    HISTORY OF PRESENT ILLNESS:   Oncology History  Melanoma metastatic to liver (HCC)  11/16/2022 Imaging   She presented to the emergency room on 11/16/2022 for back pain x 2 weeks and central chest pain for several months   CTA CAP IMPRESSION: 1. Negative for aortic dissection or aneurysm. 2. 1.2 cm left upper lobe nodule.  For 3. Multiple ill-defined liver lesions, suspicious for metastatic disease. 4. Multiple lytic osseous lesions, suspicious for metastatic  disease. Pathologic fracture of the superior endplate of L5   11/25/2022 Pathology Results   1. Liver, needle/core biopsy, Left hepatic lobe lesion :       - METASTATIC MELANOMA.    12/04/2022 Initial Diagnosis   Melanoma of skin (HCC) Liver, bone metastases. 1.2 cm LUL nodule   12/09/2022 PET scan   PET: Diffuse bone and liver metastases.  Lung metastases.    12/11/2022 -  Chemotherapy   Patient is on Treatment Plan : MELANOMA Nivolumab (1) + Ipilimumab (3) q21d / Nivolumab (480) q28d     12/17/2022 Imaging   MRI brain 1. Three small right hemisphere brain metastases metastases ranging from 2 mm to 4 mm. Minimal hemosiderin. No associated cerebral edema or mass effect. No other metastatic disease identified in the brain.   2. Known osseous metastatic disease but no destructive lesion identified in the skull or visible cervical spine.   3.  Moderate for age cerebral white matter changes most commonly due to small vessel disease.   12/17/2022 Imaging   MRI brain 1. Three small right hemisphere brain metastases metastases ranging from 2 mm to 4 mm. Minimal hemosiderin. No associated cerebral edema or mass effect. No other metastatic disease identified in the brain.   2. Known osseous metastatic disease but no destructive lesion identified in the skull or visible cervical spine.   3. Moderate for age cerebral white matter changes most commonly due to small vessel disease.   02/15/2023 PET scan   PET whole body  1. The degree metastatic disease defined on recent CT scan is less impressive on FDG PET scan. 2. There is intense hypermetabolic lesion in the lateral segment of the LEFT hepatic lobe which corresponds to a large 3.7 cm mass. Additional small hepatic lesions have mild metabolic activity. 3. Very low metabolic activity associated with a LEFT upper lobe pulmonary nodule which is decreased in size. Favor positive response to therapy. 4. Small LEFT hilar hypermetabolic node is indeterminate. 5. Widespread lytic lesions throughout the axillary appendicular skeleton. Majority these lesions do not have hypermetabolic activity. One soft tissue lesion in the LEFT sacral ala does have mild metabolic activity.       REVIEW OF SYSTEMS:   All relevant systems were reviewed with the patient and are negative.   VITALS:  Blood pressure (!) 129/93, pulse (!) 132, resp. rate 16, weight 108 lb 3.2 oz (49.1 kg), SpO2 99%.  Wt Readings from Last 3 Encounters:  03/01/23 108 lb 3.2 oz (49.1 kg)  02/19/23 107 lb 12.8 oz (48.9 kg)  02/15/23 105 lb 6 oz (47.8 kg)    Body mass index is 19.17 kg/m.  Performance status (ECOG): 2 - Symptomatic, <50% confined to bed  PHYSICAL EXAM:   GENERAL:alert, no distress and comfortable SKIN: skin color normal, no rashes  EYES: normal, sclera clear OROPHARYNX: no exudate, no erythema     NECK: supple,  non-tender, without nodularity LYMPH:  no palpable cervical lymphadenopathy LUNGS: clear to auscultation with normal breathing effort.  No wheeze or rales HEART: Tachycardic  ABDOMEN: abdomen soft, non-tender and nondistended Musculoskeletal: no edema NEURO: alert, fluent speech, no focal motor/sensory deficits.  Strength and sensation equal bilaterally.  LABORATORY DATA:  I have reviewed the data as listed    Component Value Date/Time   NA 134 (L) 03/01/2023 0808   K 4.3 03/01/2023 0808   CL 105 03/01/2023 0808   CO2 23 03/01/2023 0808   GLUCOSE 101 (H) 03/01/2023 0808   BUN  10 03/01/2023 0808   CREATININE 1.04 (H) 03/01/2023 0808   CALCIUM 8.3 (L) 03/01/2023 0808   PROT 5.3 (L) 03/01/2023 0808   ALBUMIN 2.8 (L) 03/01/2023 0808   AST 43 (H) 03/01/2023 0808   ALT 24 03/01/2023 0808   ALKPHOS 338 (H) 03/01/2023 0808   BILITOT 1.0 03/01/2023 0808   GFRNONAA >60 03/01/2023 0808   GFRAA >60 04/20/2018 0823    No results found for: "SPEP", "UPEP"  Lab Results  Component Value Date   WBC 5.3 03/01/2023   NEUTROABS 3.7 03/01/2023   HGB 11.5 (L) 03/01/2023   HCT 35.6 (L) 03/01/2023   MCV 99.4 03/01/2023   PLT 343 03/01/2023      Chemistry      Component Value Date/Time   NA 134 (L) 03/01/2023 0808   K 4.3 03/01/2023 0808   CL 105 03/01/2023 0808   CO2 23 03/01/2023 0808   BUN 10 03/01/2023 0808   CREATININE 1.04 (H) 03/01/2023 0808      Component Value Date/Time   CALCIUM 8.3 (L) 03/01/2023 0808   ALKPHOS 338 (H) 03/01/2023 0808   AST 43 (H) 03/01/2023 0808   ALT 24 03/01/2023 0808   BILITOT 1.0 03/01/2023 0808       RADIOGRAPHIC STUDIES: I have personally reviewed the radiological images as listed and agreed with the findings in the report. NM PET Image Restage (PS) Whole Body Result Date: 02/18/2023 CLINICAL DATA:  Subsequent treatment strategy for metastatic melanoma. History of brain metastasis EXAM: NUCLEAR MEDICINE PET WHOLE BODY  TECHNIQUE: 5.2 mCi F-18 FDG was injected intravenously. Full-ring PET imaging was performed from the head to foot after the radiotracer. CT data was obtained and used for attenuation correction and anatomic localization. Fasting blood glucose: 79 mg/dl COMPARISON:  CT 16/10/9602 FINDINGS: HEAD/NECK: No hypermetabolic activity in the scalp. No hypermetabolic cervical lymph nodes. Incidental CT findings: none CHEST: The largest nodule in the lungs cysts in the LEFT upper lobe measuring 9 mm (image 77/5) compared to 16 mm on prior. There is some decrease in the solidity of the nodule with a rim of peripheral hypodensity. This nodule is very mildly metabolic with SUV max 1.9 which less background blood Smaller pulmonary nodules are not hypermetabolic. Hypermetabolic LEFT hilar lymph node with SUV max equal 3.5 c on image 82. No CT correlation. Incidental CT findings: none ABDOMEN/PELVIS: There is intense metabolic activity associated with the large lesion in the LEFT hepatic lobe lateral segment with SUV max equal 7.3 on image 117. This lesion is evident as a high-density lesion measuring 3.7 cm on CT portion exam (image 116). T He multiple smaller high-density round lesions throughout the liver are less conspicuous on FDG PET imaging. One larger lesions in the medial RIGHT hepatic lobe measuring 2.2 cm on image 119) does have peripheral metabolic activity with SUV max 4.6. Incidental CT findings: none SKELETON: multiple lytic lesions within the spine and pelvis. Soft tissue component in the LEFT sacral ala has mild metabolic activity SUV max equal 3.9 on image 163. The majority of the lytic lesion of significant metabolic activity. Incidental CT findings: none EXTREMITIES: Incidental CT findings: none IMPRESSION: 1. The degree metastatic disease defined on recent CT scan is less impressive on FDG PET scan. 2. There is intense hypermetabolic lesion in the lateral segment of the LEFT hepatic lobe which corresponds to a  large 3.7 cm mass. Additional small hepatic lesions have mild metabolic activity. 3. Very low metabolic activity associated with a LEFT upper  lobe pulmonary nodule which is decreased in size. Favor positive response to therapy. 4. Small LEFT hilar hypermetabolic node is indeterminate. 5. Widespread lytic lesions throughout the axillary appendicular skeleton. Majority these lesions do not have hypermetabolic activity. One soft tissue lesion in the LEFT sacral ala does have mild metabolic activity. Electronically Signed   By: Genevive Bi M.D.   On: 02/18/2023 15:14

## 2023-02-28 NOTE — Assessment & Plan Note (Signed)
Continue Marinol 10 mg twice daily. Prescription sent to [place] [facility] community pharmacy at [facility] for daily IV fluid in the infusion room this week and next week as needed.

## 2023-03-01 ENCOUNTER — Inpatient Hospital Stay (HOSPITAL_BASED_OUTPATIENT_CLINIC_OR_DEPARTMENT_OTHER): Payer: BC Managed Care – PPO

## 2023-03-01 ENCOUNTER — Other Ambulatory Visit: Payer: Self-pay

## 2023-03-01 ENCOUNTER — Inpatient Hospital Stay (HOSPITAL_BASED_OUTPATIENT_CLINIC_OR_DEPARTMENT_OTHER): Payer: BC Managed Care – PPO | Admitting: Nurse Practitioner

## 2023-03-01 ENCOUNTER — Other Ambulatory Visit (HOSPITAL_COMMUNITY): Payer: Self-pay

## 2023-03-01 ENCOUNTER — Encounter: Payer: Self-pay | Admitting: Nurse Practitioner

## 2023-03-01 ENCOUNTER — Inpatient Hospital Stay: Payer: BC Managed Care – PPO

## 2023-03-01 ENCOUNTER — Inpatient Hospital Stay: Payer: BC Managed Care – PPO | Attending: Physician Assistant

## 2023-03-01 VITALS — BP 129/93 | HR 132 | Resp 16 | Wt 108.2 lb

## 2023-03-01 DIAGNOSIS — G893 Neoplasm related pain (acute) (chronic): Secondary | ICD-10-CM | POA: Diagnosis not present

## 2023-03-01 DIAGNOSIS — I10 Essential (primary) hypertension: Secondary | ICD-10-CM | POA: Diagnosis not present

## 2023-03-01 DIAGNOSIS — R110 Nausea: Secondary | ICD-10-CM

## 2023-03-01 DIAGNOSIS — R52 Pain, unspecified: Secondary | ICD-10-CM | POA: Insufficient documentation

## 2023-03-01 DIAGNOSIS — R630 Anorexia: Secondary | ICD-10-CM

## 2023-03-01 DIAGNOSIS — C787 Secondary malignant neoplasm of liver and intrahepatic bile duct: Secondary | ICD-10-CM | POA: Insufficient documentation

## 2023-03-01 DIAGNOSIS — K5909 Other constipation: Secondary | ICD-10-CM | POA: Diagnosis not present

## 2023-03-01 DIAGNOSIS — Z515 Encounter for palliative care: Secondary | ICD-10-CM | POA: Insufficient documentation

## 2023-03-01 DIAGNOSIS — Z79620 Long term (current) use of immunosuppressive biologic: Secondary | ICD-10-CM | POA: Insufficient documentation

## 2023-03-01 DIAGNOSIS — C7800 Secondary malignant neoplasm of unspecified lung: Secondary | ICD-10-CM | POA: Diagnosis not present

## 2023-03-01 DIAGNOSIS — K219 Gastro-esophageal reflux disease without esophagitis: Secondary | ICD-10-CM | POA: Diagnosis not present

## 2023-03-01 DIAGNOSIS — Z5112 Encounter for antineoplastic immunotherapy: Secondary | ICD-10-CM | POA: Diagnosis not present

## 2023-03-01 DIAGNOSIS — C7931 Secondary malignant neoplasm of brain: Secondary | ICD-10-CM | POA: Diagnosis not present

## 2023-03-01 DIAGNOSIS — E860 Dehydration: Secondary | ICD-10-CM | POA: Insufficient documentation

## 2023-03-01 DIAGNOSIS — K5900 Constipation, unspecified: Secondary | ICD-10-CM | POA: Diagnosis not present

## 2023-03-01 DIAGNOSIS — Z9189 Other specified personal risk factors, not elsewhere classified: Secondary | ICD-10-CM

## 2023-03-01 DIAGNOSIS — E876 Hypokalemia: Secondary | ICD-10-CM | POA: Insufficient documentation

## 2023-03-01 DIAGNOSIS — R112 Nausea with vomiting, unspecified: Secondary | ICD-10-CM

## 2023-03-01 DIAGNOSIS — C439 Malignant melanoma of skin, unspecified: Secondary | ICD-10-CM | POA: Diagnosis present

## 2023-03-01 DIAGNOSIS — R638 Other symptoms and signs concerning food and fluid intake: Secondary | ICD-10-CM

## 2023-03-01 DIAGNOSIS — C7951 Secondary malignant neoplasm of bone: Secondary | ICD-10-CM | POA: Insufficient documentation

## 2023-03-01 DIAGNOSIS — R530 Neoplastic (malignant) related fatigue: Secondary | ICD-10-CM | POA: Diagnosis not present

## 2023-03-01 LAB — CBC WITH DIFFERENTIAL (CANCER CENTER ONLY)
Abs Immature Granulocytes: 0.03 10*3/uL (ref 0.00–0.07)
Basophils Absolute: 0 10*3/uL (ref 0.0–0.1)
Basophils Relative: 1 %
Eosinophils Absolute: 0.2 10*3/uL (ref 0.0–0.5)
Eosinophils Relative: 4 %
HCT: 35.6 % — ABNORMAL LOW (ref 36.0–46.0)
Hemoglobin: 11.5 g/dL — ABNORMAL LOW (ref 12.0–15.0)
Immature Granulocytes: 1 %
Lymphocytes Relative: 19 %
Lymphs Abs: 1 10*3/uL (ref 0.7–4.0)
MCH: 32.1 pg (ref 26.0–34.0)
MCHC: 32.3 g/dL (ref 30.0–36.0)
MCV: 99.4 fL (ref 80.0–100.0)
Monocytes Absolute: 0.4 10*3/uL (ref 0.1–1.0)
Monocytes Relative: 7 %
Neutro Abs: 3.7 10*3/uL (ref 1.7–7.7)
Neutrophils Relative %: 68 %
Platelet Count: 343 10*3/uL (ref 150–400)
RBC: 3.58 MIL/uL — ABNORMAL LOW (ref 3.87–5.11)
RDW: 18.6 % — ABNORMAL HIGH (ref 11.5–15.5)
WBC Count: 5.3 10*3/uL (ref 4.0–10.5)
nRBC: 0 % (ref 0.0–0.2)

## 2023-03-01 LAB — CMP (CANCER CENTER ONLY)
ALT: 24 U/L (ref 0–44)
AST: 43 U/L — ABNORMAL HIGH (ref 15–41)
Albumin: 2.8 g/dL — ABNORMAL LOW (ref 3.5–5.0)
Alkaline Phosphatase: 338 U/L — ABNORMAL HIGH (ref 38–126)
Anion gap: 6 (ref 5–15)
BUN: 10 mg/dL (ref 6–20)
CO2: 23 mmol/L (ref 22–32)
Calcium: 8.3 mg/dL — ABNORMAL LOW (ref 8.9–10.3)
Chloride: 105 mmol/L (ref 98–111)
Creatinine: 1.04 mg/dL — ABNORMAL HIGH (ref 0.44–1.00)
GFR, Estimated: >60 mL/min (ref >60)
Glucose, Bld: 101 mg/dL — ABNORMAL HIGH (ref 70–99)
Potassium: 4.3 mmol/L (ref 3.5–5.1)
Sodium: 134 mmol/L — ABNORMAL LOW (ref 135–145)
Total Bilirubin: 1 mg/dL (ref 0.0–1.2)
Total Protein: 5.3 g/dL — ABNORMAL LOW (ref 6.5–8.1)

## 2023-03-01 LAB — LACTATE DEHYDROGENASE: LDH: 609 U/L — ABNORMAL HIGH (ref 98–192)

## 2023-03-01 LAB — TSH: TSH: 5.582 u[IU]/mL — ABNORMAL HIGH (ref 0.350–4.500)

## 2023-03-01 MED ORDER — ONDANSETRON 8 MG PO TBDP
8.0000 mg | ORAL_TABLET | Freq: Three times a day (TID) | ORAL | 2 refills | Status: DC | PRN
  Filled 2023-03-01: qty 20, 7d supply, fill #0
  Filled 2023-03-01 – 2023-03-02 (×3): qty 18, 21d supply, fill #0
  Filled 2023-03-20 – 2023-03-23 (×2): qty 18, 21d supply, fill #1

## 2023-03-01 MED ORDER — SODIUM CHLORIDE 0.9 % IV SOLN: INTRAVENOUS | Status: DC

## 2023-03-01 MED ORDER — FAMOTIDINE IN NACL 20-0.9 MG/50ML-% IV SOLN
20.0000 mg | Freq: Once | INTRAVENOUS | Status: AC
  Administered 2023-03-01: 20 mg via INTRAVENOUS
  Filled 2023-03-01: qty 50

## 2023-03-01 MED ORDER — PROCHLORPERAZINE MALEATE 5 MG PO TABS
5.0000 mg | ORAL_TABLET | Freq: Four times a day (QID) | ORAL | 1 refills | Status: DC | PRN
  Filled 2023-03-01: qty 60, 15d supply, fill #0
  Filled 2023-03-21: qty 60, 15d supply, fill #1

## 2023-03-01 MED ORDER — NIVOLUMAB CHEMO INJECTION 100 MG/10ML
240.0000 mg | Freq: Once | INTRAVENOUS | Status: AC
  Administered 2023-03-01: 240 mg via INTRAVENOUS
  Filled 2023-03-01: qty 24

## 2023-03-01 NOTE — Assessment & Plan Note (Addendum)
Compazine, zofran, reglan, scopolamine did not work. Will resume famotidine 20 mg twice daily. Give one dose at infusion today.

## 2023-03-01 NOTE — Progress Notes (Unsigned)
[facility] CSW Progress Note  Clinical Social Worker {Blank single:19197::"met with patient","met with caregiver","contacted patient by phone","contacted caregiver by phone","***"} to ***.    Marguerita Merles, LCSW Clinical Social Worker [facility]    Patient is participating in a Managed Medicaid Plan:  {MM YES/NO:27447::"Yes"}

## 2023-03-01 NOTE — Addendum Note (Signed)
Addended by: Geanie Berlin on: 03/01/2023 02:19 PM   Modules accepted: Orders

## 2023-03-01 NOTE — Patient Instructions (Signed)
 [facility] - A DEPT OF [facility]  Discharge Instructions: Thank you for choosing [facility] to provide your oncology and hematology care.   If you have a lab appointment with the Cancer Center, please go directly to the Cancer Center and check in at the registration area.   Wear comfortable clothing and clothing appropriate for easy access to any Portacath or PICC line.   We strive to give you quality time with your provider. You may need to reschedule your appointment if you arrive late (15 or more minutes).  Arriving late affects you and other patients whose appointments are after yours.  Also, if you miss three or more appointments without notifying the office, you may be dismissed from the clinic at the provider's discretion.      For prescription refill requests, have your pharmacy contact our office and allow 72 hours for refills to be completed.    Today you received the following chemotherapy and/or immunotherapy agents nivolumab      To help prevent nausea and vomiting after your treatment, we encourage you to take your nausea medication as directed.  BELOW ARE SYMPTOMS THAT SHOULD BE REPORTED IMMEDIATELY: *FEVER GREATER THAN 100.4 F (38 C) OR HIGHER *CHILLS OR SWEATING *NAUSEA AND VOMITING THAT IS NOT CONTROLLED WITH YOUR NAUSEA MEDICATION *UNUSUAL SHORTNESS OF BREATH *UNUSUAL BRUISING OR BLEEDING *URINARY PROBLEMS (pain or burning when urinating, or frequent urination) *BOWEL PROBLEMS (unusual diarrhea, constipation, pain near the anus) TENDERNESS IN MOUTH AND THROAT WITH OR WITHOUT PRESENCE OF ULCERS (sore throat, sores in mouth, or a toothache) UNUSUAL RASH, SWELLING OR PAIN  UNUSUAL VAGINAL DISCHARGE OR ITCHING   Items with * indicate a potential emergency and should be followed up as soon as possible or go to the Emergency Department if any problems should occur.  Please show the CHEMOTHERAPY ALERT CARD or IMMUNOTHERAPY  ALERT CARD at check-in to the Emergency Department and triage nurse.  Should you have questions after your visit or need to cancel or reschedule your appointment, please contact [facility] - A DEPT OF [facility]  Dept: [phone]  and follow the prompts.  Office hours are 8:00 a.m. to 4:30 p.m. Monday - Friday. Please note that voicemails left after 4:00 p.m. may not be returned until the following business day.  We are closed weekends and major holidays. You have access to a nurse at all times for urgent questions. Please call the main number to the clinic Dept: [phone] and follow the prompts.   For any non-urgent questions, you may also contact your provider using MyChart. We now offer e-Visits for anyone 28 and older to request care online for non-urgent symptoms. For details visit [email or web address].   Also download the MyChart app! Go to the app store, search "MyChart", open the app, select [place], and log in with your MyChart username and password.

## 2023-03-01 NOTE — Assessment & Plan Note (Signed)
LFTs later this week and then every 2 weeks  EKG on 12/30 and then montly x3 months  Monitor for diarrhea, nausea, rash, MSK pain, visual changes Monitor for irAEs

## 2023-03-02 ENCOUNTER — Other Ambulatory Visit: Payer: Self-pay

## 2023-03-02 ENCOUNTER — Other Ambulatory Visit (HOSPITAL_COMMUNITY): Payer: Self-pay

## 2023-03-02 MED ORDER — LORAZEPAM 0.5 MG PO TABS
0.5000 mg | ORAL_TABLET | Freq: Once | ORAL | 0 refills | Status: AC
  Filled 2023-03-02: qty 1, 1d supply, fill #0

## 2023-03-02 NOTE — Progress Notes (Signed)
Ativan 0.5 mg sent for port placement pre patient request

## 2023-03-03 ENCOUNTER — Other Ambulatory Visit (HOSPITAL_COMMUNITY): Payer: Self-pay

## 2023-03-03 ENCOUNTER — Encounter (HOSPITAL_COMMUNITY): Payer: Self-pay

## 2023-03-03 ENCOUNTER — Other Ambulatory Visit: Payer: Self-pay

## 2023-03-03 DIAGNOSIS — C7931 Secondary malignant neoplasm of brain: Secondary | ICD-10-CM | POA: Diagnosis not present

## 2023-03-03 DIAGNOSIS — C439 Malignant melanoma of skin, unspecified: Secondary | ICD-10-CM | POA: Diagnosis not present

## 2023-03-03 DIAGNOSIS — R0602 Shortness of breath: Secondary | ICD-10-CM | POA: Diagnosis not present

## 2023-03-03 NOTE — Progress Notes (Signed)
Pt wanted to make sure that her previous instructions to arrive at 0530 for her 0800 MRI with anesthesia was correct. Pt advised that she was given the correct time, and to follow all instructions given.

## 2023-03-03 NOTE — Progress Notes (Signed)
 PCP - Katina Pfeiffer, [name] as PCP - General (Family Medicine)  Pickenpack-Cousar, Fannie SAILOR, NP as Nurse Practitioner (Hospice and Palliative Medicine)  Cardiologist -   PPM/ICD - denies Device Orders - n/a Rep Notified - n/a  Chest x-ray - 01-03-23 EKG - 01-03-23 Stress Test -  ECHO -  Cardiac Cath -   CPAP - denies  DM - denies  Blood Thinner Instructions: denies Aspirin Instructions: n/a  ERAS Protcol - clear liquids until 5:00  COVID TEST- n/a  Anesthesia review: no  Patient verbally denies any shortness of breath, fever, cough and chest pain during phone call   -------------  SDW INSTRUCTIONS given:  Your procedure is scheduled on March 04, 2023.  Report to [facility]The Main Entrance A at 5:30 A.M., and check in at the Admitting office.  Call this number if you have problems the morning of surgery:  [phone]   Remember:  Do not eat after midnight the night before your surgery  You may drink clear liquids until 5:00 the morning of your surgery.   Clear liquids allowed are: Water , Non-Citrus Juices (without pulp), Carbonated Beverages, Clear Tea, Black Coffee Only, and Gatorade    Take these medicines the morning of surgery with A SIP OF WATER   dronabinol  (MARINOL )  gabapentin  (NEURONTIN )  famotidine  (PEPCID )  metoCLOPramide  (REGLAN )  ondansetron  (ZOFRAN -ODT)  traMADol  (ULTRAM )  vemurafenib  (ZELBORAF )   As of today, STOP taking any Aspirin (unless otherwise instructed by your surgeon) Aleve, Naproxen, Ibuprofen , Motrin , Advil , Goody's, BC's, all herbal medications, fish oil, and all vitamins.                      Do not wear jewelry, make up, or nail polish            Do not wear lotions, powders, perfumes/colognes, or deodorant.            Do not shave 48 hours prior to surgery.  Men may shave face and neck.            Do not bring valuables to the hospital.            [facility] is not responsible for any belongings or valuables.  Do NOT Smoke  (Tobacco/Vaping) 24 hours prior to your procedure If you use a CPAP at night, you may bring all equipment for your overnight stay.   Contacts, glasses, dentures or bridgework may not be worn into surgery.      For patients admitted to the hospital, discharge time will be determined by your treatment team.   Patients discharged the day of surgery will not be allowed to drive home, and someone needs to stay with them for 24 hours.    Special instructions:   [place]- Preparing For Surgery  Before surgery, you can play an important role. Because skin is not sterile, your skin needs to be as free of germs as possible. You can reduce the number of germs on your skin by washing with CHG (chlorahexidine gluconate) Soap before surgery.  CHG is an antiseptic cleaner which kills germs and bonds with the skin to continue killing germs even after washing.    Oral Hygiene is also important to reduce your risk of infection.  Remember - BRUSH YOUR TEETH THE MORNING OF SURGERY WITH YOUR REGULAR TOOTHPASTE  Please do not use if you have an allergy to CHG or antibacterial soaps. If your skin becomes reddened/irritated stop using the CHG.  Do not shave (  including legs and underarms) for at least 48 hours prior to first CHG shower. It is OK to shave your face.  Please follow these instructions carefully.   Shower the NIGHT BEFORE SURGERY and the MORNING OF SURGERY with DIAL Soap.   Pat yourself dry with a CLEAN TOWEL.  Wear CLEAN PAJAMAS to bed the night before surgery  Place CLEAN SHEETS on your bed the night of your first shower and DO NOT SLEEP WITH PETS.   Day of Surgery: Please shower morning of surgery  Wear Clean/Comfortable clothing the morning of surgery Do not apply any deodorants/lotions.   Remember to brush your teeth WITH YOUR REGULAR TOOTHPASTE.   Questions were answered. Patient verbalized understanding of instructions.

## 2023-03-04 ENCOUNTER — Other Ambulatory Visit: Payer: Self-pay

## 2023-03-04 ENCOUNTER — Ambulatory Visit (HOSPITAL_COMMUNITY)
Admission: RE | Admit: 2023-03-04 | Discharge: 2023-03-04 | Disposition: A | Discharge status: Home / Self Care | Payer: BC Managed Care – PPO

## 2023-03-04 ENCOUNTER — Encounter (HOSPITAL_COMMUNITY): Admission: RE | Disposition: A | Discharge status: Home / Self Care | Payer: Self-pay | Source: Home / Self Care

## 2023-03-04 ENCOUNTER — Ambulatory Visit (HOSPITAL_COMMUNITY)
Admission: RE | Admit: 2023-03-04 | Discharge: 2023-03-04 | Disposition: A | Discharge status: Home / Self Care | Payer: BC Managed Care – PPO | Source: Ambulatory Visit

## 2023-03-04 ENCOUNTER — Ambulatory Visit (HOSPITAL_COMMUNITY): Payer: BC Managed Care – PPO | Admitting: Anesthesiology

## 2023-03-04 ENCOUNTER — Other Ambulatory Visit: Payer: Self-pay | Admitting: Student

## 2023-03-04 ENCOUNTER — Encounter (HOSPITAL_COMMUNITY): Payer: Self-pay

## 2023-03-04 DIAGNOSIS — C7951 Secondary malignant neoplasm of bone: Secondary | ICD-10-CM | POA: Diagnosis not present

## 2023-03-04 DIAGNOSIS — Z85820 Personal history of malignant melanoma of skin: Secondary | ICD-10-CM | POA: Diagnosis not present

## 2023-03-04 DIAGNOSIS — C787 Secondary malignant neoplasm of liver and intrahepatic bile duct: Secondary | ICD-10-CM

## 2023-03-04 DIAGNOSIS — C7931 Secondary malignant neoplasm of brain: Secondary | ICD-10-CM | POA: Diagnosis not present

## 2023-03-04 DIAGNOSIS — F419 Anxiety disorder, unspecified: Secondary | ICD-10-CM | POA: Diagnosis not present

## 2023-03-04 DIAGNOSIS — E7800 Pure hypercholesterolemia, unspecified: Secondary | ICD-10-CM | POA: Diagnosis not present

## 2023-03-04 DIAGNOSIS — C439 Malignant melanoma of skin, unspecified: Secondary | ICD-10-CM | POA: Diagnosis not present

## 2023-03-04 HISTORY — PX: RADIOLOGY WITH ANESTHESIA: SHX6223

## 2023-03-04 SURGERY — MRI WITH ANESTHESIA
Anesthesia: General

## 2023-03-04 MED ORDER — SUCCINYLCHOLINE CHLORIDE 200 MG/10ML IV SOSY
PREFILLED_SYRINGE | INTRAVENOUS | Status: DC | PRN
  Administered 2023-03-04: 120 mg via INTRAVENOUS

## 2023-03-04 MED ORDER — BUPIVACAINE LIPOSOME 1.3 % IJ SUSP
INTRAMUSCULAR | Status: AC
  Filled 2023-03-04: qty 10

## 2023-03-04 MED ORDER — METOCLOPRAMIDE HCL 5 MG/ML IJ SOLN
INTRAMUSCULAR | Status: DC | PRN
  Administered 2023-03-04: 5 mg via INTRAVENOUS

## 2023-03-04 MED ORDER — FENTANYL CITRATE (PF) 250 MCG/5ML IJ SOLN
INTRAMUSCULAR | Status: DC | PRN
  Administered 2023-03-04: 100 ug via INTRAVENOUS

## 2023-03-04 MED ORDER — CHLORHEXIDINE GLUCONATE 0.12 % MT SOLN
15.0000 mL | Freq: Once | OROMUCOSAL | Status: AC
  Administered 2023-03-04: 15 mL via OROMUCOSAL
  Filled 2023-03-04: qty 15

## 2023-03-04 MED ORDER — DEXAMETHASONE SODIUM PHOSPHATE 10 MG/ML IJ SOLN
INTRAMUSCULAR | Status: DC | PRN
  Administered 2023-03-04: 10 mg via INTRAVENOUS

## 2023-03-04 MED ORDER — PROPOFOL 10 MG/ML IV BOLUS
INTRAVENOUS | Status: DC | PRN
  Administered 2023-03-04: 160 mg via INTRAVENOUS

## 2023-03-04 MED ORDER — ORAL CARE MOUTH RINSE: 15.0000 mL | Freq: Once | OROMUCOSAL | Status: AC

## 2023-03-04 MED ORDER — GADOBUTROL 1 MMOL/ML IV SOLN
7.5000 mL | Freq: Once | INTRAVENOUS | Status: AC | PRN
  Administered 2023-03-04: 7.5 mL via INTRAVENOUS

## 2023-03-04 MED ORDER — PHENYLEPHRINE HCL-NACL 20-0.9 MG/250ML-% IV SOLN
INTRAVENOUS | Status: DC | PRN
  Administered 2023-03-04: 20 ug/min via INTRAVENOUS

## 2023-03-04 MED ORDER — ONDANSETRON HCL 4 MG/2ML IJ SOLN
INTRAMUSCULAR | Status: DC | PRN
  Administered 2023-03-04: 4 mg via INTRAVENOUS

## 2023-03-04 MED ORDER — LACTATED RINGERS IV SOLN: INTRAVENOUS | Status: DC | PRN

## 2023-03-04 MED ORDER — LACTATED RINGERS IV SOLN: INTRAVENOUS | Status: DC

## 2023-03-04 NOTE — Progress Notes (Signed)
 Dr. Brain Cahill made aware of patient's elevated heart rate this morning. No new orders received.

## 2023-03-04 NOTE — Transfer of Care (Signed)
 Immediate Anesthesia Transfer of Care Note  Patient: Laura [name]  Procedure(s) Performed: MRI WITH ANESTHESIA OF BRAIN WITH AND WITHOUT CONTRAST  Patient Location: PACU  Anesthesia Type:General  Level of Consciousness: awake, alert , oriented, and patient cooperative  Airway & Oxygen  Therapy: Patient Spontanous Breathing  Post-op Assessment: Report given to RN and Post -op Vital signs reviewed and stable  Post vital signs: Reviewed and stable  Last Vitals:  Vitals Value Taken Time  BP 101/68   Temp 99.1 oral   Pulse 121   Resp 15   SpO2 96   Temp 99.1 oral  Last Pain:  Vitals:   03/04/23 0613  PainSc: 0-No pain      Patients Stated Pain Goal: 0 (03/04/23 9386)  Complications: No notable events documented.

## 2023-03-04 NOTE — Anesthesia Procedure Notes (Signed)
 Procedure Name: Intubation Date/Time: 03/04/2023 8:20 AM  Performed by: Loreda Rodriguez, CRNAPre-anesthesia Checklist: Patient identified, Emergency Drugs available, Suction available and Patient being monitored Patient Re-evaluated:Patient Re-evaluated prior to induction Oxygen  Delivery Method: Circle System Utilized Preoxygenation: Pre-oxygenation with 100% oxygen  Induction Type: IV induction Ventilation: Mask ventilation without difficulty Laryngoscope Size: Glidescope, Mac and 3 Grade View: Grade I Tube type: Oral Number of attempts: 1 Airway Equipment and Method: Stylet and Oral airway Placement Confirmation: ETT inserted through vocal cords under direct vision, positive ETCO2 and breath sounds checked- equal and bilateral Secured at: 20 cm Tube secured with: Tape Dental Injury: Teeth and Oropharynx as per pre-operative assessment

## 2023-03-04 NOTE — Anesthesia Preprocedure Evaluation (Signed)
 Anesthesia Evaluation  Patient identified by MRN, date of birth, ID band Patient awake    Reviewed: Allergy & Precautions, NPO status , Patient's Chart, lab work & pertinent test results  History of Anesthesia Complications (+) PONV and history of anesthetic complications  Airway Mallampati: II  TM Distance: >3 FB Neck ROM: Full    Dental  (+) Edentulous Upper, Dental Advisory Given   Pulmonary neg shortness of breath, neg sleep apnea, neg COPD, neg recent URI, former smoker   breath sounds clear to auscultation       Cardiovascular  Rhythm:Regular Rate:Tachycardia     Neuro/Psych  Headaches PSYCHIATRIC DISORDERS Anxiety      Neuromuscular disease    GI/Hepatic Neg liver ROS,GERD  Medicated,,  Endo/Other    Renal/GU Lab Results      Component                Value               Date                      NA                       134 (L)             03/01/2023                K                        4.3                 03/01/2023                CO2                      23                  03/01/2023                GLUCOSE                  101 (H)             03/01/2023                BUN                      10                  03/01/2023                CREATININE               1.04 (H)            03/01/2023                CALCIUM                   8.3 (L)             03/01/2023                GFRNONAA                 >60                 03/01/2023  Musculoskeletal  (+) Arthritis ,  Fibromyalgia -  Abdominal   Peds  Hematology  (+) Blood dyscrasia, anemia Lab Results      Component                Value               Date                      WBC                      5.3                 03/01/2023                HGB                      11.5 (L)            03/01/2023                HCT                      35.6 (L)            03/01/2023                MCV                      99.4                 03/01/2023                PLT                      343                 03/01/2023              Anesthesia Other Findings   Reproductive/Obstetrics                             Anesthesia Physical Anesthesia Plan  ASA: 3  Anesthesia Plan: General   Post-op Pain Management: Minimal or no pain anticipated   Induction: Intravenous, Rapid sequence and Cricoid pressure planned  PONV Risk Score and Plan: 4 or greater and Ondansetron , Dexamethasone , Midazolam  and Metaclopromide  Airway Management Planned: Oral ETT  Additional Equipment: None  Intra-op Plan:   Post-operative Plan: Extubation in OR  Informed Consent: I have reviewed the patients History and Physical, chart, labs and discussed the procedure including the risks, benefits and alternatives for the proposed anesthesia with the patient or authorized representative who has indicated his/her understanding and acceptance.     Dental advisory given  Plan Discussed with: CRNA  Anesthesia Plan Comments:        Anesthesia Quick Evaluation

## 2023-03-04 NOTE — Progress Notes (Signed)
 Dr. KYM Cools call anesthesiologist- made aware that patient's heart rate was 133 upon arrival to Short Stay. Per the patient, she has had tachycardia for a couple of months but no one has addressed it. Dr. Leonce also made aware that past documentation shows heart rate as high as 140 at previous office visits. Patient denies any complaints. Patient placed on monitor. Verbal order received from Dr. Leonce to start an IV and give a 250 cc bolus of Lactated Ringers . Today's assigned anesthesiologist- Dr. Leopoldo will be notified upon arrival.

## 2023-03-05 ENCOUNTER — Ambulatory Visit (HOSPITAL_COMMUNITY)
Admission: RE | Admit: 2023-03-05 | Discharge: 2023-03-05 | Disposition: A | Discharge status: Home / Self Care | Payer: BC Managed Care – PPO | Source: Ambulatory Visit

## 2023-03-05 ENCOUNTER — Encounter (HOSPITAL_COMMUNITY): Payer: Self-pay | Admitting: Radiology

## 2023-03-05 ENCOUNTER — Other Ambulatory Visit: Payer: BC Managed Care – PPO

## 2023-03-05 ENCOUNTER — Ambulatory Visit: Payer: BC Managed Care – PPO

## 2023-03-05 ENCOUNTER — Other Ambulatory Visit: Payer: Self-pay

## 2023-03-05 DIAGNOSIS — E785 Hyperlipidemia, unspecified: Secondary | ICD-10-CM | POA: Insufficient documentation

## 2023-03-05 DIAGNOSIS — F17290 Nicotine dependence, other tobacco product, uncomplicated: Secondary | ICD-10-CM | POA: Diagnosis not present

## 2023-03-05 DIAGNOSIS — C7931 Secondary malignant neoplasm of brain: Secondary | ICD-10-CM | POA: Insufficient documentation

## 2023-03-05 DIAGNOSIS — K219 Gastro-esophageal reflux disease without esophagitis: Secondary | ICD-10-CM | POA: Insufficient documentation

## 2023-03-05 DIAGNOSIS — E860 Dehydration: Secondary | ICD-10-CM

## 2023-03-05 DIAGNOSIS — C439 Malignant melanoma of skin, unspecified: Secondary | ICD-10-CM | POA: Diagnosis not present

## 2023-03-05 DIAGNOSIS — I10 Essential (primary) hypertension: Secondary | ICD-10-CM | POA: Insufficient documentation

## 2023-03-05 DIAGNOSIS — C7951 Secondary malignant neoplasm of bone: Secondary | ICD-10-CM | POA: Diagnosis not present

## 2023-03-05 DIAGNOSIS — C787 Secondary malignant neoplasm of liver and intrahepatic bile duct: Secondary | ICD-10-CM

## 2023-03-05 DIAGNOSIS — C799 Secondary malignant neoplasm of unspecified site: Secondary | ICD-10-CM | POA: Diagnosis not present

## 2023-03-05 HISTORY — PX: IR IMAGING GUIDED PORT INSERTION: IMG5740

## 2023-03-05 MED ORDER — FENTANYL CITRATE (PF) 100 MCG/2ML IJ SOLN
INTRAMUSCULAR | Status: AC
  Filled 2023-03-05: qty 2

## 2023-03-05 MED ORDER — SODIUM CHLORIDE 0.9 % IV SOLN: Freq: Once | INTRAVENOUS | Status: AC

## 2023-03-05 MED ORDER — HEPARIN SOD (PORK) LOCK FLUSH 100 UNIT/ML IV SOLN
INTRAVENOUS | Status: AC
  Filled 2023-03-05: qty 5

## 2023-03-05 MED ORDER — MIDAZOLAM HCL 2 MG/2ML IJ SOLN
INTRAMUSCULAR | Status: AC
  Filled 2023-03-05: qty 4

## 2023-03-05 MED ORDER — LIDOCAINE-EPINEPHRINE 1 %-1:100000 IJ SOLN
INTRAMUSCULAR | Status: AC
  Filled 2023-03-05: qty 1

## 2023-03-05 MED ORDER — HEPARIN SOD (PORK) LOCK FLUSH 100 UNIT/ML IV SOLN
500.0000 [IU] | Freq: Once | INTRAVENOUS | Status: AC
  Administered 2023-03-05: 500 [IU] via INTRAVENOUS

## 2023-03-05 MED ORDER — FENTANYL CITRATE (PF) 100 MCG/2ML IJ SOLN
INTRAMUSCULAR | Status: AC | PRN
  Administered 2023-03-05 (×2): 50 ug via INTRAVENOUS

## 2023-03-05 MED ORDER — FAMOTIDINE IN NACL 20-0.9 MG/50ML-% IV SOLN: 20.0000 mg | Freq: Once | INTRAVENOUS | Status: DC

## 2023-03-05 MED ORDER — LIDOCAINE HCL 1 % IJ SOLN
20.0000 mL | Freq: Once | INTRAMUSCULAR | Status: AC
  Administered 2023-03-05: 5 mL via INTRADERMAL

## 2023-03-05 MED ORDER — LIDOCAINE HCL 1 % IJ SOLN
INTRAMUSCULAR | Status: AC
  Filled 2023-03-05: qty 20

## 2023-03-05 MED ORDER — LIDOCAINE-EPINEPHRINE 1 %-1:100000 IJ SOLN
20.0000 mL | Freq: Once | INTRAMUSCULAR | Status: AC
  Administered 2023-03-05: 20 mL via INTRADERMAL

## 2023-03-05 MED ORDER — MIDAZOLAM HCL 2 MG/2ML IJ SOLN
INTRAMUSCULAR | Status: AC | PRN
  Administered 2023-03-05 (×2): .5 mg via INTRAVENOUS
  Administered 2023-03-05: 1 mg via INTRAVENOUS

## 2023-03-05 NOTE — H&P (Signed)
 Chief Complaint: Patient was seen in consultation today for metastatic melanoma  Referring Physician(s): Chang,Rubens C  Supervising Physician: Johann Sieving  Patient Status: [facility] - Out-pt  History of Present Illness: Laura [name] is a 58 y.o. female with history of HTN, HLD, GERD, arthritis who presents with recent diagnosis of malignant melanoma in Oct 2024.  She has significant disease symptom burden including decreased oral intake, nausea, vomiting, and ongoing Palliative intervention.  She is in need of durable venous access for ongoing care needs. She is referred to IR for [facility] placement.   Patient presents today in her usual state of health.  She has been NPO.  She is accompanied by her husband today. Denies fever, chills, new or worsening abdomina pain, nausea, vomiting.  She is aware of the benefits and risks of the procedure and is agreeable to proceed.   Past Medical History:  Diagnosis Date   Anxiety    Arthritis    Complication of anesthesia    Fibromyalgia    Gallstones 04/20/2018   GERD (gastroesophageal reflux disease)    History of blood transfusion    post Hysterectomy   Hypercholesteremia    Melanoma (HCC)    on back and excised   Migraine    PONV (postoperative nausea and vomiting)    Tobacco abuse    Vertebral artery disease (HCC)    history of left vertebral artery dissection 2011    Past Surgical History:  Procedure Laterality Date   ABDOMINAL HYSTERECTOMY     ANTERIOR FUSION CERVICAL SPINE  2008   BONE GRAFT HIP ILIAC CREST Left    Left Hip to Left Wrist   CHOLECYSTECTOMY N/A 04/20/2018   Procedure: LAPAROSCOPIC CHOLECYSTECTOMY;  Surgeon: Gail Favorite, MD;  Location: [facility] OR;  Service: General;  Laterality: N/A;   RADIOLOGY WITH ANESTHESIA N/A 12/17/2022   Procedure: MRI BRAIN WITH AND WITHOUT CONTRAST, MRI LUMBAR SPINE WITH AND WITHOUT CONTRAST WITH ANESTHESIA;  Surgeon: Radiologist, Medication, MD;  Location: [facility] OR;  Service:  Radiology;  Laterality: N/A;   RADIOLOGY WITH ANESTHESIA N/A 03/04/2023   Procedure: MRI WITH ANESTHESIA OF BRAIN WITH AND WITHOUT CONTRAST;  Surgeon: Radiologist, Medication, MD;  Location: [facility] OR;  Service: Radiology;  Laterality: N/A;   thumb surgery Left 2018   [facility]- Tendons Carpal tunnel   WRIST SURGERY  1994    Allergies: Aspirin  Medications: Prior to Admission medications   Medication Sig Start Date End Date Taking? Authorizing Provider  AMBIEN  CR 6.25 MG CR tablet Take 6.25 mg by mouth at bedtime. 05/22/19  Yes [provider]  cobimetinib fumarate  (COTELLIC ) 20 MG tablet TAKE 3 TABLETS BY MOUTH 1 TIME A DAY ON DAYS 1-21. REPEAT EVERY 28 DAYS. AVOID SUN EXPOSURE. 03/04/23  Yes Tina Pauletta BROCKS, MD  docusate sodium  (COLACE) 100 MG capsule Take 100-200 mg by mouth See admin instructions. 200 mg in the morning, 100 mg at bedtime   Yes [provider]  dronabinol  (MARINOL ) 10 MG capsule Take 1 capsule (10 mg total) by mouth 3 (three) times daily. Patient taking differently: Take 10 mg by mouth 2 (two) times daily before a meal. 02/12/23  Yes Pickenpack-Cousar, Fannie SAILOR, NP  famotidine  (PEPCID ) 20 MG tablet Take 20 mg by mouth 2 (two) times daily.   Yes [provider]  fentaNYL  (DURAGESIC ) 12 MCG/HR Place 1 patch onto the skin every 3 (three) days. 02/09/23  Yes Pickenpack-Cousar, Fannie SAILOR, NP  gabapentin  (NEURONTIN ) 100 MG capsule Take 1 capsule (100  mg total) by mouth 2 (two) times daily. Patient taking differently: Take 100 mg by mouth at bedtime. 02/02/23  Yes Tina Pauletta BROCKS, MD  LORazepam  (ATIVAN ) 0.5 MG tablet Take 1 tablet (0.5 mg total) by mouth once for 1 dose. About 30 minutes prior to port placement 03/02/23 03/05/23 Yes Tina Pauletta BROCKS, MD  metoCLOPramide  (REGLAN ) 10 MG tablet Take 1 tablet (10 mg total) by mouth 3 (three) times daily before meals. 02/26/23  Yes Pickenpack-Cousar, Fannie SAILOR, NP  potassium chloride  SA (KLOR-CON  M) 20 MEQ tablet Take 40 mEq by mouth  2 (two) times daily.   Yes [provider]  rosuvastatin  (CRESTOR ) 10 MG tablet Take 10 mg by mouth at bedtime.   Yes [provider]  scopolamine  (TRANSDERM-SCOP) 1 MG/3DAYS Place 1 patch (1.5 mg total) onto the skin every 3 (three) days. 02/15/23  Yes Pickenpack-Cousar, Fannie SAILOR, NP  traMADol  (ULTRAM ) 50 MG tablet Take 1-2 tablets (50-100 mg total) by mouth every 6 (six) hours as needed for moderate pain (pain score 4-6) or severe pain (pain score 7-10). 02/09/23  Yes Pickenpack-Cousar, Fannie SAILOR, NP  vemurafenib  (ZELBORAF ) 240 MG tablet Take 4 tablets (960 mg total) by mouth every 12 (twelve) hours. Take with water . 02/25/23  Yes Tina Pauletta BROCKS, MD  ondansetron  (ZOFRAN -ODT) 8 MG disintegrating tablet Dissolve 1 tablet (8 mg total) by mouth every 8 (eight) hours as needed for nausea or vomiting. 03/01/23   Tina Pauletta BROCKS, MD  prochlorperazine  (COMPAZINE ) 5 MG tablet Take 1 tablet (5 mg total) by mouth every 6 (six) hours as needed for nausea or vomiting. 03/01/23   Tina Pauletta BROCKS, MD     Family History  Problem Relation Age of Onset   Lung cancer Father        smoking hx   Breast cancer Sister        diagnosed over the age of 8   Breast cancer Sister        diagnosed over the age of 42    Social History   Socioeconomic History   Marital status: Married    Spouse name: Not on file   Number of children: Not on file   Years of education: Not on file   Highest education level: Not on file  Occupational History   Not on file  Tobacco Use   Smoking status: Former    Current packs/day: 0.00    Average packs/day: 1 pack/day for 20.0 years (20.0 ttl pk-yrs)    Types: Cigarettes    Start date: 53    Quit date: 2018    Years since quitting: 7.1   Smokeless tobacco: Never  Vaping Use   Vaping status: Every Day   Substances: Nicotine  Substance and Sexual Activity   Alcohol use: No    Alcohol/week: 0.0 standard drinks of alcohol   Drug use: No   Sexual activity: Not  on file  Other Topics Concern   Not on file  Social History Narrative   Married,  3 kids.   Lives home with husband,  Works at [employer].  Education 12th grade.  Caffeine  pepsi daily and tea daily.   '   Social Drivers of Health   Financial Resource Strain: Not on file  Food Insecurity: No Food Insecurity (01/03/2023)   Hunger Vital Sign    Worried About Running Out of Food in the Last Year: Never true    Ran Out of Food in the Last Year: Never true  Transportation Needs:  No Transportation Needs (01/03/2023)   PRAPARE - Administrator, Civil Service (Medical): No    Lack of Transportation (Non-Medical): No  Physical Activity: Not on file  Stress: Not on file  Social Connections: Not on file     Review of Systems: A 12 point ROS discussed and pertinent positives are indicated in the HPI above.  All other systems are negative.  Review of Systems  Constitutional:  Negative for fatigue and fever.  Respiratory:  Negative for cough and shortness of breath.   Cardiovascular:  Negative for chest pain.  Musculoskeletal:  Negative for back pain.  Psychiatric/Behavioral:  Negative for behavioral problems and confusion.     Vital Signs: BP 115/86   Pulse (!) 114   Temp 97.8 F (36.6 C) (Oral)   Resp 16   SpO2 98%   Physical Exam Vitals and nursing note reviewed.  Constitutional:      General: She is not in acute distress.    Appearance: Normal appearance. She is not ill-appearing.  HENT:     Mouth/Throat:     Mouth: Mucous membranes are moist.     Pharynx: Oropharynx is clear.  Cardiovascular:     Rate and Rhythm: Normal rate and regular rhythm.  Pulmonary:     Effort: Pulmonary effort is normal. No respiratory distress.     Breath sounds: Normal breath sounds.  Abdominal:     General: Abdomen is flat. There is no distension.     Palpations: Abdomen is soft.  Skin:    General: Skin is warm and dry.  Neurological:     General: No focal deficit present.      Mental Status: She is alert and oriented to person, place, and time. Mental status is at baseline.  Psychiatric:        Mood and Affect: Mood normal.        Behavior: Behavior normal.        Thought Content: Thought content normal.        Judgment: Judgment normal.      MD Evaluation Airway: WNL Heart: WNL Abdomen: WNL Chest/ Lungs: WNL ASA  Classification: 3 Mallampati/Airway Score: Two   Imaging: NM PET Image Restage (PS) Whole Body Result Date: 02/18/2023 CLINICAL DATA:  Subsequent treatment strategy for metastatic melanoma. History of brain metastasis EXAM: NUCLEAR MEDICINE PET WHOLE BODY TECHNIQUE: 5.2 mCi F-18 FDG was injected intravenously. Full-ring PET imaging was performed from the head to foot after the radiotracer. CT data was obtained and used for attenuation correction and anatomic localization. Fasting blood glucose: 79 mg/dl COMPARISON:  CT 87/97/7975 FINDINGS: HEAD/NECK: No hypermetabolic activity in the scalp. No hypermetabolic cervical lymph nodes. Incidental CT findings: none CHEST: The largest nodule in the lungs cysts in the LEFT upper lobe measuring 9 mm (image 77/5) compared to 16 mm on prior. There is some decrease in the solidity of the nodule with a rim of peripheral hypodensity. This nodule is very mildly metabolic with SUV max 1.9 which less background blood Smaller pulmonary nodules are not hypermetabolic. Hypermetabolic LEFT hilar lymph node with SUV max equal 3.5 c on image 82. No CT correlation. Incidental CT findings: none ABDOMEN/PELVIS: There is intense metabolic activity associated with the large lesion in the LEFT hepatic lobe lateral segment with SUV max equal 7.3 on image 117. This lesion is evident as a high-density lesion measuring 3.7 cm on CT portion exam (image 116). T He multiple smaller high-density round lesions throughout the liver are less  conspicuous on FDG PET imaging. One larger lesions in the medial RIGHT hepatic lobe measuring 2.2 cm on  image 119) does have peripheral metabolic activity with SUV max 4.6. Incidental CT findings: none SKELETON: multiple lytic lesions within the spine and pelvis. Soft tissue component in the LEFT sacral ala has mild metabolic activity SUV max equal 3.9 on image 163. The majority of the lytic lesion of significant metabolic activity. Incidental CT findings: none EXTREMITIES: Incidental CT findings: none IMPRESSION: 1. The degree metastatic disease defined on recent CT scan is less impressive on FDG PET scan. 2. There is intense hypermetabolic lesion in the lateral segment of the LEFT hepatic lobe which corresponds to a large 3.7 cm mass. Additional small hepatic lesions have mild metabolic activity. 3. Very low metabolic activity associated with a LEFT upper lobe pulmonary nodule which is decreased in size. Favor positive response to therapy. 4. Small LEFT hilar hypermetabolic node is indeterminate. 5. Widespread lytic lesions throughout the axillary appendicular skeleton. Majority these lesions do not have hypermetabolic activity. One soft tissue lesion in the LEFT sacral ala does have mild metabolic activity. Electronically Signed   By: Jackquline Boxer M.D.   On: 02/18/2023 15:14    Labs:  CBC: Recent Labs    01/11/23 1222 02/02/23 1100 02/19/23 0851 03/01/23 0808  WBC 8.9 5.3 6.7 5.3  HGB 10.6* 8.9* 11.2* 11.5*  HCT 31.8* 26.7* 33.2* 35.6*  PLT 340 297 313 343    COAGS: Recent Labs    11/25/22 0930 12/27/22 2246 01/03/23 1602  INR 1.1 1.3* 1.3*  APTT  --  30  --     BMP: Recent Labs    02/02/23 1100 02/19/23 0851 02/23/23 1506 03/01/23 0808  NA 137 137 135 134*  K 4.4 2.3* 3.0* 4.3  CL 107 96* 101 105  CO2 27 29 28 23   GLUCOSE 98 120* 121* 101*  BUN 9 10 10 10   CALCIUM  7.7* 8.4* 8.1* 8.3*  CREATININE 0.99 1.09* 1.04* 1.04*  GFRNONAA >60 60* >60 >60    LIVER FUNCTION TESTS: Recent Labs    01/26/23 1340 02/02/23 1100 02/19/23 0851 03/01/23 0808  BILITOT 1.7* 1.2  1.3* 1.0  AST 77* 48* 51* 43*  ALT 29 21 26 24   ALKPHOS 483* 363* 363* 338*  PROT 5.1* 4.9* 5.3* 5.3*  ALBUMIN 2.5* 2.3* 2.9* 2.8*    TUMOR MARKERS: Recent Labs    11/18/22 1524  CEA 1.82    Assessment and Plan: Metastatic melanoma Patient with past medical history of melanoma presents in  need of durable venous access.  IR consulted for Port placement at the request of Dr. Tina. Case reviewed by Dr. Johann who approves patient for procedure.  Patient presents today in their usual state of health.  She has been NPO and is not currently on blood thinners.   Risks and benefits of image guided port-a-catheter placement was discussed with the patient including, but not limited to bleeding, infection, pneumothorax, or fibrin sheath development and need for additional procedures.  All of the patient's questions were answered, patient is agreeable to proceed. Consent signed and in chart.  She is Full Code.  Thank you for this interesting consult.  I greatly enjoyed meeting Laura [name] and look forward to participating in their care.  A copy of this report was sent to the requesting provider on this date.  Electronically Signed: Tiffay Pinette Sue-Ellen Juliannah Ohmann, PA 03/05/2023, 2:50 PM   I spent a total of  30 Minutes  in face to face in clinical consultation, greater than 50% of which was counseling/coordinating care for metastatic melanoma.

## 2023-03-05 NOTE — Procedures (Signed)
  Procedure:  R internal jugular port placement   Preprocedure diagnosis: Diagnoses of Melanoma metastatic to liver [facility]), Malignant melanoma metastatic to bone [facility]), and Dehydration were pertinent to this visit. Postprocedure diagnosis: same EBL:    minimal Complications:   none immediate  See full dictation in [employer].  CHARM Toribio Faes MD Main # [phone] Pager  [phone] Mobile [phone]

## 2023-03-05 NOTE — Anesthesia Postprocedure Evaluation (Signed)
 Anesthesia Post Note  Patient: Laura [name]  Procedure(s) Performed: MRI WITH ANESTHESIA OF BRAIN WITH AND WITHOUT CONTRAST     Patient location during evaluation: PACU Anesthesia Type: General Level of consciousness: awake and alert Pain management: pain level controlled Vital Signs Assessment: post-procedure vital signs reviewed and stable Respiratory status: spontaneous breathing, nonlabored ventilation and respiratory function stable Cardiovascular status: blood pressure returned to baseline and stable Postop Assessment: no apparent nausea or vomiting Anesthetic complications: no   No notable events documented.  Last Vitals:  Vitals:   03/04/23 0915 03/04/23 0933  BP: 108/72 107/66  Pulse: (!) 120 (!) 126  Resp: 18 15  Temp:  37.3 C  SpO2: 99% 96%    Last Pain:  Vitals:   03/04/23 0933  PainSc: 0-No pain                 Chad Tiznado

## 2023-03-05 NOTE — Discharge Instructions (Addendum)
 For questions /concerns may call Interventional Radiology at [phone] or  Interventional Radiology clinic [phone]   You may remove your dressing and shower tomorrow afternoon  DO NOT use EMLA cream for 2 weeks after port placement as the cream will remove surgical glue on your incision.                                                                   Implanted Port Insertion, Care After The following information offers guidance on how to care for yourself after your procedure. Your health care provider may also give you more specific instructions. If you have problems or questions, contact your health care provider. What can I expect after the procedure? After the procedure, it is common to have: Discomfort at the port insertion site. Bruising on the skin over the port. This should improve over 3-4 days. Follow these instructions at home: [facility] care After your port is placed, you will get a manufacturer's information card. The card has information about your port. Keep this card with you at all times. Take care of the port as told by your health care provider. Ask your health care provider if you or a family member can get training for taking care of the port at home. A home health care nurse will be be available to help care for the port. Make sure to remember what type of port you have. Incision care  Follow instructions from your health care provider about how to take care of your port insertion site. Make sure you: Wash your hands with soap and water for at least 20 seconds before and after you change your bandage (dressing). If soap and water are not available, use hand sanitizer. Change your dressing as told by your health care provider. Leave stitches (sutures), skin glue, or adhesive strips in place. These skin closures may need to stay in place for 2 weeks or longer. If adhesive strip edges start to loosen and curl up, you may trim the loose edges. Do not remove adhesive  strips completely unless your health care provider tells you to do that. Check your port insertion site every day for signs of infection. Check for: Redness, swelling, or pain. Fluid or blood. Warmth. Pus or a bad smell. Activity Return to your normal activities as told by your health care provider. Ask your health care provider what activities are safe for you. You may have to avoid lifting. Ask your health care provider how much you can safely lift. General instructions Take over-the-counter and prescription medicines only as told by your health care provider. Do not take baths, swim, or use a hot tub until your health care provider approves. Ask your health care provider if you may take showers. You may only be allowed to take sponge baths. If you were given a sedative during the procedure, it can affect you for several hours. Do not drive or operate machinery until your health care provider says that it is safe. Wear a medical alert bracelet in case of an emergency. This will tell any health care providers that you have a port. Keep all follow-up visits. This is important. Contact a health care provider if: You cannot flush your port with saline as directed, or you cannot draw blood  from the port. You have a fever or chills. You have redness, swelling, or pain around your port insertion site. You have fluid or blood coming from your port insertion site. Your port insertion site feels warm to the touch. You have pus or a bad smell coming from the port insertion site. Get help right away if: You have chest pain or shortness of breath. You have bleeding from your port that you cannot control. These symptoms may be an emergency. Get help right away. Call 911. Do not wait to see if the symptoms will go away. Do not drive yourself to the hospital. Summary Take care of the port as told by your health care provider. Keep the manufacturer's information card with you at all times. Change your  dressing as told by your health care provider. Contact a health care provider if you have a fever or chills or if you have redness, swelling, or pain around your port insertion site. Keep all follow-up visits. This information is not intended to replace advice given to you by your health care provider. Make sure you discuss any questions you have with your health care provider. Document Revised: 07/16/2020 Document Reviewed: 07/16/2020 Elsevier Patient Education  2024 [employer]vier Inc.                                                         Moderate Conscious Sedation, Adult, Care After After the procedure, it is common to have: Sleepiness for a few hours. Impaired judgment for a few hours. Trouble with balance. Nausea or vomiting if you eat too soon. Follow these instructions at home: For the time period you were told by your health care provider:  Rest. Do not participate in activities where you could fall or become injured. Do not drive or use machinery. Do not drink alcohol. Do not take sleeping pills or medicines that cause drowsiness. Do not make important decisions or sign legal documents. Do not take care of children on your own. Eating and drinking Follow instructions from your health care provider about what you may eat and drink. Drink enough fluid to keep your urine pale yellow. If you vomit: Drink clear fluids slowly and in small amounts as you are able. Clear fluids include water, ice chips, low-calorie sports drinks, and fruit juice that has water added to it (diluted fruit juice). Eat light and bland foods in small amounts as you are able. These foods include bananas, applesauce, rice, lean meats, toast, and crackers. General instructions Take over-the-counter and prescription medicines only as told by your health care provider. Have a responsible adult stay with you for the time you are told. Do not use any products that contain nicotine or tobacco. These products  include cigarettes, chewing tobacco, and vaping devices, such as e-cigarettes. If you need help quitting, ask your health care provider. Return to your normal activities as told by your health care provider. Ask your health care provider what activities are safe for you. Your health care provider may give you more instructions. Make sure you know what you can and cannot do. Contact a health care provider if: You are still sleepy or having trouble with balance after 24 hours. You feel light-headed. You vomit every time you eat or drink. You get a rash. You have a fever. You have redness  or swelling around the IV site. Get help right away if: You have trouble breathing. You start to feel confused at home. These symptoms may be an emergency. Get help right away. Call 911. Do not wait to see if the symptoms will go away. Do not drive yourself to the hospital. This information is not intended to replace advice given to you by your health care provider. Make sure you discuss any questions you have with your health care provider. Document Revised: 07/28/2021 Document Reviewed: 07/28/2021 Elsevier Patient Education  2024 [employer].

## 2023-03-08 ENCOUNTER — Other Ambulatory Visit: Payer: BC Managed Care – PPO

## 2023-03-08 ENCOUNTER — Other Ambulatory Visit: Payer: Self-pay | Admitting: Nurse Practitioner

## 2023-03-08 ENCOUNTER — Inpatient Hospital Stay: Payer: BC Managed Care – PPO | Admitting: Dietician

## 2023-03-08 ENCOUNTER — Ambulatory Visit: Payer: BC Managed Care – PPO

## 2023-03-08 ENCOUNTER — Other Ambulatory Visit (HOSPITAL_COMMUNITY): Payer: Self-pay

## 2023-03-08 DIAGNOSIS — G893 Neoplasm related pain (acute) (chronic): Secondary | ICD-10-CM

## 2023-03-08 DIAGNOSIS — C7951 Secondary malignant neoplasm of bone: Secondary | ICD-10-CM

## 2023-03-08 DIAGNOSIS — Z515 Encounter for palliative care: Secondary | ICD-10-CM

## 2023-03-08 DIAGNOSIS — C7931 Secondary malignant neoplasm of brain: Secondary | ICD-10-CM

## 2023-03-08 MED ORDER — FENTANYL 12 MCG/HR TD PT72
1.0000 | MEDICATED_PATCH | TRANSDERMAL | 0 refills | Status: DC
  Filled 2023-03-11: qty 10, 30d supply, fill #0

## 2023-03-10 ENCOUNTER — Other Ambulatory Visit: Payer: Self-pay

## 2023-03-10 ENCOUNTER — Ambulatory Visit (INDEPENDENT_AMBULATORY_CARE_PROVIDER_SITE_OTHER): Payer: BC Managed Care – PPO

## 2023-03-10 ENCOUNTER — Ambulatory Visit (INDEPENDENT_AMBULATORY_CARE_PROVIDER_SITE_OTHER): Payer: BC Managed Care – PPO | Admitting: Orthopedic Surgery

## 2023-03-10 VITALS — BP 129/90 | HR 128 | Ht 63.0 in | Wt 107.0 lb

## 2023-03-10 DIAGNOSIS — M8448XA Pathological fracture, other site, initial encounter for fracture: Secondary | ICD-10-CM

## 2023-03-10 NOTE — Progress Notes (Signed)
Orthopedic Spine Surgery Office Note  Assessment: Patient is a 58 y.o. female with metastatic melanoma with multiple lesions in her liver, axial skeleton, sacrum, brain, and hilar nodes. Has an L5 pathological fracture causing mechanical back pain. SINS score of 12.    Plan: -Patient has tried tylenol, aleve, narcotics, radiation, chemotherapy  -I discussed her symptoms, her imaging, and possible treatments with her. I explained that she could get palliative treatment/care to help with the pain. Someone had mentioned kyphoplasty to her. I recommended against this as a surgical option as her fracture is more than a compression fracture with a lesion in the posterior vertebral body and extension into the canal. I think there would a significant risk of cement entering the canal in this case. For that reasons, I discussed operative stabilization with instrumented spinal fusion. Ideally, I would recommend 2 levels above and below. This would mean a L3-pelvis posterior instrumented spinal fusion. However, her L3 facet and pedicle on the right has a lesion so the purchase may not be great at that level and would extend in that scenario up to L2. She is no longer having any radiating leg pain and has no neurologic deficits so would not plan for any decompression at this time. I went over the risks, benefits, and alternatives of this proposed surgery. She wanted to think about it further and stated she would let me know what she decides.    Patient expressed understanding of the plan and all questions were answered to the patient's satisfaction.   ___________________________________________________________________________   History:  Patient is a 58 y.o. female who presents today for lumbar spine. Patient with onset of low back and bilateral radiating lower extremity pain in October of 2024. She was found to have a pathological L5 fracture. She was then referred to oncology. Work up revealed metastatic  melanoma with multiple metastatic sites. She underwent radiation treatment to her lumbar spine in the fall of 2024. She noted after that treatment, her radiating leg pain subsided. She has also been on chemotherapy during this time as well. She is still having significant back pain, She noted the back pain is present in her lower back when walking or standing. It resolves if she lays down. Her pain has been stable for some time now. It limits her ability to do any activity because it hurts essentially all the time that she is upright.    Weakness: denies Symptoms of imbalance: denies Paresthesias and numbness: denies Bowel or bladder incontinence: denies Saddle anesthesia: denies  Treatments tried: tylenol, aleve, narcotics, radiation, chemotherapy   Review of systems: Denies fevers. Has had chills, night sweats, unplanned weight loss, pain that wakes her at night.   Past medical history: Melanoma Anxiety Fibromyalgia GERD HLD  Allergies: aspirin  Past surgical history:  ACDF Hysterectomy Cholecystectomy  Carpal tunnel release  Social history: Denies use of nicotine product (smoking, vaping, patches, smokeless) Alcohol use: denies Denies recreational drug use   Physical Exam:  BMI of 19.0  General: no acute distress, appears stated age Neurologic: alert, answering questions appropriately, following commands Respiratory: unlabored breathing on room air, symmetric chest rise Psychiatric: appropriate affect, normal cadence to speech   MSK (spine):  -Strength exam      Left  Right EHL    5/5  5/5 TA    5/5  5/5 GSC    5/5  5/5 Knee extension  5/5  5/5 Hip flexion   5/5  5/5  -Sensory exam    Sensation  intact to light touch in L3-S1 nerve distributions of bilateral lower extremities  -Achilles DTR: 2/4 on the left, 2/4 on the right -Patellar tendon DTR: 2/4 on the left, 2/4 on the right  -Straight leg raise: negative bilaterally -Clonus: no beats  bilaterally -Negative Romberg -Negative grip and release  -Left hip exam: no pain through range of motion, negative stinchfield, negative faber -Right hip exam: no pain through range of motion, negative stinchfield, negative faber  Imaging: XRs of the lumbar spine from 03/10/2023 were independently reviewed and interpreted, showing scoliosis with apex to the right at L1.  Fracture seen of the L5 vertebral with height loss that measures 52% when compared to the L4 vertebra.  Anterior height loss noted at the L1 vertebra consistent with fracture. No other fractures seen.  No evidence of instability on flexion/extension views.  CT of the abdomen/pelvis from 12/28/2022 was independently reviewed and interpreted for the lumbar spine, showing pathological fracture at L5 that extends into the posterior column.  Soft tissue mass seen extending posteriorly into the canal behind the L5 vertebral body.  The L5 lesion is lytic in nature.  Extension of the lytic lesion is seen into the right pedicle and facet joint.  No extension posteriorly on the contralateral side.  Multiple lytic lesions seen throughout the thoracic or lumbar spine at nearly every vertebra.  Outside of the L5 vertebra, there are 2 larger lesions.  One lesion is seen at L4 with soft tissue extension into the spinal canal.  The other is at L1 and also shows extension into the spinal canal.  Large soft tissue mass seen in the lower sacrum as well with extension into the intrapelvic region.  MRI of the lumbar spine from 12/17/2022 was independently reviewed and interpreted, showing lesion within the L5 vertebral body with decreased signal.  Fracture at the L5 vertebral with height loss.  There is a lesion within the L1 vertebra with posterior extension into the canal on the left side with no significant stenosis.  Another lesion is seen with extension into the canal at L4 near the cranial aspect of the vertebra.  It is extension of the canal is  greater on the right side.  No significant stenosis seen at that level.  At the level of the L5 fracture, there is retropulsion with extension of the L5 lesion into the spinal canal.  There is central lateral recess stenosis behind the L5 vertebra.  Bilateral foraminal stenosis at L5/S1.   Patient name: Laura [name] Patient MRN: [id number] Date of visit: 03/10/23

## 2023-03-11 ENCOUNTER — Other Ambulatory Visit (HOSPITAL_COMMUNITY): Payer: Self-pay

## 2023-03-11 ENCOUNTER — Other Ambulatory Visit: Payer: Self-pay

## 2023-03-11 ENCOUNTER — Other Ambulatory Visit: Payer: Self-pay | Admitting: Nurse Practitioner

## 2023-03-11 DIAGNOSIS — Z515 Encounter for palliative care: Secondary | ICD-10-CM

## 2023-03-11 DIAGNOSIS — C7951 Secondary malignant neoplasm of bone: Secondary | ICD-10-CM

## 2023-03-11 DIAGNOSIS — G893 Neoplasm related pain (acute) (chronic): Secondary | ICD-10-CM

## 2023-03-11 DIAGNOSIS — C7931 Secondary malignant neoplasm of brain: Secondary | ICD-10-CM

## 2023-03-11 MED ORDER — TRAMADOL HCL 50 MG PO TABS
50.0000 mg | ORAL_TABLET | Freq: Four times a day (QID) | ORAL | 0 refills | Status: DC | PRN
  Filled 2023-03-11: qty 90, 12d supply, fill #0

## 2023-03-11 MED ORDER — GABAPENTIN 100 MG PO CAPS
100.0000 mg | ORAL_CAPSULE | Freq: Two times a day (BID) | ORAL | 0 refills | Status: DC
  Filled 2023-03-11: qty 60, 30d supply, fill #0

## 2023-03-12 ENCOUNTER — Other Ambulatory Visit: Payer: Self-pay

## 2023-03-12 DIAGNOSIS — C7951 Secondary malignant neoplasm of bone: Secondary | ICD-10-CM

## 2023-03-12 NOTE — Progress Notes (Signed)
Palliative Medicine [facility]  [phone] [phone]) [phone]   Name: Laura [name] Date: 03/12/2023 MRN: [id number]  DOB: [birth year]  Patient Care Team: [name], [name] as PCP - General (Family Medicine) Pickenpack-Cousar, Arty Baumgartner, NP as Nurse Practitioner [facility] and Palliative Medicine)    INTERVAL HISTORY: Laura [name] is a 58 y.o. female with oncologic medical history including malignant melanoma (10/2022) with metastatic disease to the bone. As well as a history of hypertension, hyperlipidemia, GERD, and arthritis. Palliative ask to see for symptom management and goals of care.   SOCIAL HISTORY:     reports that she quit smoking about 7 years ago. Her smoking use included cigarettes. She started smoking about 27 years ago. She has a 20 pack-year smoking history. She has never used smokeless tobacco. She reports that she does not drink alcohol and does not use drugs.  ADVANCE DIRECTIVES:  None on file  CODE STATUS: Full code  PAST MEDICAL HISTORY: Past Medical History:  Diagnosis Date   Anxiety    Arthritis    Complication of anesthesia    Fibromyalgia    Gallstones 04/20/2018   GERD (gastroesophageal reflux disease)    History of blood transfusion    post Hysterectomy   Hypercholesteremia    Melanoma (HCC)    on back and excised   Migraine    PONV (postoperative nausea and vomiting)    Tobacco abuse    Vertebral artery disease (HCC)    history of left vertebral artery dissection 2011    ALLERGIES:  is allergic to aspirin.  MEDICATIONS:  Current Outpatient Medications  Medication Sig Dispense Refill   AMBIEN CR 6.25 MG CR tablet Take 6.25 mg by mouth at bedtime.     cobimetinib fumarate (COTELLIC) 20 MG tablet TAKE 3 TABLETS BY MOUTH 1 TIME A DAY ON DAYS 1-21. REPEAT EVERY 28 DAYS. AVOID SUN EXPOSURE. 63 tablet 0   docusate sodium (COLACE) 100 MG capsule Take 100-200 mg by mouth See admin instructions. 200 mg  in the morning, 100 mg at bedtime     dronabinol (MARINOL) 10 MG capsule Take 1 capsule (10 mg total) by mouth 3 (three) times daily. (Patient taking differently: Take 10 mg by mouth 2 (two) times daily before a meal.) 90 capsule 0   famotidine (PEPCID) 20 MG tablet Take 20 mg by mouth 2 (two) times daily.     fentaNYL (DURAGESIC) 12 MCG/HR Place 1 patch onto the skin every 3 (three) days. 10 patch 0   gabapentin (NEURONTIN) 100 MG capsule Take 1 capsule (100 mg total) by mouth 2 (two) times daily. 60 capsule 0   metoCLOPramide (REGLAN) 10 MG tablet Take 1 tablet (10 mg total) by mouth 3 (three) times daily before meals. 90 tablet 1   ondansetron (ZOFRAN-ODT) 8 MG disintegrating tablet Dissolve 1 tablet (8 mg total) by mouth every 8 (eight) hours as needed for nausea or vomiting. 20 tablet 2   potassium chloride SA (KLOR-CON M) 20 MEQ tablet Take 40 mEq by mouth 2 (two) times daily.     prochlorperazine (COMPAZINE) 5 MG tablet Take 1 tablet (5 mg total) by mouth every 6 (six) hours as needed for nausea or vomiting. 60 tablet 1   rosuvastatin (CRESTOR) 10 MG tablet Take 10 mg by mouth at bedtime.     scopolamine (TRANSDERM-SCOP) 1 MG/3DAYS Place 1 patch (1.5 mg total) onto the skin every 3 (three) days. 10 patch 12   traMADol (ULTRAM)  50 MG tablet Take 1-2 tablets (50-100 mg total) by mouth every 6 (six) hours as needed for moderate pain (pain score 4-6) or severe pain (pain score 7-10). 90 tablet 0   ZELBORAF 240 MG tablet TAKE 4 TABLETS BY MOUTH EVERY 12 HOURS. TAKE WITH WATER. 224 tablet 0   No current facility-administered medications for this visit.    VITAL SIGNS: There were no vitals taken for this visit. There were no vitals filed for this visit.  Estimated body mass index is 18.95 kg/m as calculated from the following:   Height as of 03/10/23: 5\' 3"  (1.6 m).   Weight as of 03/10/23: 107 lb (48.5 kg).   PERFORMANCE STATUS (ECOG) : 1 - Symptomatic but completely ambulatory   Physical  Exam General: Fatigued, in recliner Cardiovascular: regular rate and rhythm Pulmonary: normal breathing pattern Neurological: AAO x3  Discussed the use of AI scribe software for clinical note transcription with the patient, who gave verbal consent to proceed.   IMPRESSION:  I saw [name] during her infusion for symptom management follow-up. Husband is present. She is complaining of ongoing nausea. Denies constipation or diarrhea. Ongoing fatigue. Continues to try to push herself taking things one day at a time. Appetite fluctuates. Some days better than others. Her weight is down some to 105lbs from 108lbs 2/5.   She experiences persistent nausea, primarily in the morning, which improves throughout the day and evening. Exertion exacerbates her symptoms, leading to episodes of 'reverse throw up.' She has been taking Reglan four times a day, Scopolamine patch, Zofran, and Compazine as needed, but reports no significant relief from these medications. Education provided on use of Robinul for her intractable nausea and vomiting. We discussed administration, efficacy, and potential side effects. Patient and husband aware medication can cause dry mouth, and this will support need for pushing water and other fluid intake.   [name] reports she is having sleep disturbances, stating she does not 'sleep good at all' and wakes up frequently during the night. She is currently taking Ambien at a dose of 5 mg, but it is not effectively helping her fall or stay asleep. She sometimes remains awake watching TV even after taking the medication. Recommended increasing dose to 10mg  providing education on use and potential side effects.   She reports no significant pain except for some hip pain, which is not persistent. She is tolerating current regimen which includes Tramadol 50-100mg  as needed and Fentanyl patch. Appreciative pain is overall controlled. No adjustments at this time.   Mr. Avellino has  been out of work since October and is now having to return. Their son is available to assist patient on Wednesdays and Thursdays, which are the best days for her to receive fluids due to his availability if needed. Team is aware.   All questions answered and support provided.  Assessment and Plan  Nausea Persistent despite multiple antiemetics (Reglan, Zofran, Compazine, Scopolamine patch). Recently started on Pepcid by Dr. Cherly Hensen. -Increase Reglan to four times a day. -Continue Pepcid as prescribed by Dr. Cherly Hensen. -Check-in later this week to assess response to increased Reglan dose.  Pain Reports improvement in pain after previous intervention. -Continue current pain management regimen. -Fentanyl patch -Tramadol 50-100mg  for breakthrough -Gabapentin 100 mg twice daily  Home Health Discussed the possibility of home health services, but insurance coverage is uncertain. -Explore the possibility of home health services with [employer].  Cancer Treatment Scheduled for IV fluids, Pepcid, and Nivolumab today. -  Continue with scheduled treatment today.  Follow-up Plan to touch base on later this week to assess response to increased Reglan dose. -I will plan to see patient back in 2-3 weeks. Sooner if needed.  I discussed the importance of continued conversation with family and their medical providers regarding overall plan of care and treatment options, ensuring decisions are within the context of the patients values and GOCs.  Patient expressed understanding and was in agreement with this plan. She also understands that She can call the clinic at any time with any questions, concerns, or complaints.   Any controlled substances utilized were prescribed in the context of palliative care. PDMP has been reviewed.   Visit consisted of counseling and education dealing with the complex and emotionally intense issues of symptom management and palliative care in the setting of serious  and potentially life-threatening illness.  Willette Alma, AGPCNP-BC  Palliative Medicine Team/[facility]

## 2023-03-15 ENCOUNTER — Telehealth: Payer: Self-pay

## 2023-03-15 ENCOUNTER — Other Ambulatory Visit (HOSPITAL_COMMUNITY): Payer: Self-pay

## 2023-03-15 ENCOUNTER — Telehealth: Payer: Self-pay | Admitting: Pharmacy Technician

## 2023-03-15 ENCOUNTER — Other Ambulatory Visit: Payer: Self-pay

## 2023-03-15 NOTE — Telephone Encounter (Signed)
Oral Oncology Patient Advocate Encounter   Received notification that prior authorization for [employer] is required.   PA submitted on 03/15/23 Key [id number] ([employer]) Key [id number] [employer]ighb[employer]) Status is pending     Jinger Neighbors, CPhT-Adv Oncology Pharmacy Patient Advocate [facility] Direct Number: [phone]  Fax: [phone]

## 2023-03-15 NOTE — Progress Notes (Unsigned)
Patient Care Team: [name], [name] as PCP - General (Family Medicine) Pickenpack-Cousar, Arty Baumgartner, NP as Nurse Practitioner [facility] and Palliative Medicine)  Clinic Day:  03/16/2023  Referring physician: Melven Sartorius, MD  ASSESSMENT & PLAN:   Assessment & Plan: Diagnosis: 11/25/2022. Diffuse liver, bone, lung metastases. Brain metastases after starting systemic treatment Treatment: 12/11/2022. First line ipi/nivo. Completed 2 cycles. 12/14/22-12/28/22 radiation to L-spine 30Gy/42fx 12/14  vemurafenib and cobimetinib due to disease uncontrolled or worsening symptoms, liver metastases  Discussed results from MRI of the brain.  Report new lesions along with smaller lesions previous identified.  Timing of last MRI to initial start of targeted therapy was about 3 weeks during rapid progression clinically at the time.    The multiple brain metastasis can be life threatening and I am concerning about activities vemurafenib/cobimetinib may not be enough for CNS efficacy. Discussed switching to encorafenib and binimetinib. Combination therapy with encorafenib plus binimetinib has shown intracranial activity in melanoma patient with BRAF positive brain metastases, including previously treated with BRAF/MEK inhibitors.  Side effect profiles are similar more tolerable in general. Cancer. 2020 Feb 1;126(3):523-530. doi: 10.1002/cncr.16109. Epub 2019 Oct 28.   I printed out and gave her drug information on encorafenib and binimetinib and went over the toxicities. Discussed stop vemurafenib/cobimetinib when she gets the new drug supplies. She understands.  Melanoma metastatic to liver [facility]) LFT improved. PET showed response. Continue monitor. Changing to encorafenib  450 mg once daily with binimetinib 45 mg twice daily. Orders placed PET scan about end of March CBC, CMP, LDH on  with follow up Continue nivolumab  Nausea with vomiting Possibly due to cobimetinib since she feels better on  the week off. Treatment will be changed to an encorafenib with binimetinib Continue antiemetic per palliative care.  Hypokalemia Potassium chloride 20 mEq twice daily  Dehydration Due to nausea and vomiting IVF 1 L NS today.   Brain metastases Changing BRAF/MEK inhibitors Repeat MRI in about 2 months  The patient understands the plans discussed today and is in agreement with them.  She knows to contact our office if she develops concerns prior to her next appointment.  Melven Sartorius, MD  [facility] - A DEPT OF [facility] [address] [place] [address] Dept: [phone] Dept Fax: [phone]   No orders of the defined types were placed in this encounter.  CHIEF COMPLAINT:  CC: stage IV melanoma  INTERVAL HISTORY:  [name] is here today for repeat clinical assessment. She reported having persistent nausea vomiting despite multiple medications.  Later she told me that during the week off from cobimetinib she feels better.  No headaches with nausea vomiting.  She denies any sensation of head pressure, double vision or focal weakness.  No chest pain, shortness of breath.  She denies any abdominal pain, distention.  No diarrhea or constipation.  1 episode of right hip pain walking in today but reported at home she was able to walk a little bit around the house.  I have reviewed the past medical history, past surgical history, social history and family history with the patient and they are unchanged from previous note.  ALLERGIES:  is allergic to aspirin.  MEDICATIONS:  Current Outpatient Medications  Medication Sig Dispense Refill   binimetinib (MEKTOVI) 15 MG tablet Take 3 tablets (45 mg total) by mouth 2 (two) times daily. 180 tablet 11   encorafenib (BRAFTOVI) 75 MG capsule Take  6 capsules (450 mg total) by mouth daily. 180 capsule 11   AMBIEN CR 6.25 MG CR tablet Take 6.25 mg by mouth at bedtime.      docusate sodium (COLACE) 100 MG capsule Take 100-200 mg by mouth See admin instructions. 200 mg in the morning, 100 mg at bedtime     dronabinol (MARINOL) 10 MG capsule Take 1 capsule (10 mg total) by mouth 3 (three) times daily. (Patient taking differently: Take 10 mg by mouth 2 (two) times daily before a meal.) 90 capsule 0   famotidine (PEPCID) 20 MG tablet Take 20 mg by mouth 2 (two) times daily.     fentaNYL (DURAGESIC) 12 MCG/HR Place 1 patch onto the skin every 3 (three) days. 10 patch 0   gabapentin (NEURONTIN) 100 MG capsule Take 1 capsule (100 mg total) by mouth 2 (two) times daily. 60 capsule 0   glycopyrrolate (ROBINUL) 1 MG tablet Take 1 tablet (1 mg total) by mouth 2 (two) times daily as needed. 30 tablet 2   lidocaine-prilocaine (EMLA) cream Apply to port site 30-45 min prior to appointment. 30 g 6   metoCLOPramide (REGLAN) 10 MG tablet Take 1 tablet (10 mg total) by mouth 3 (three) times daily before meals. 90 tablet 1   ondansetron (ZOFRAN-ODT) 8 MG disintegrating tablet Dissolve 1 tablet (8 mg total) by mouth every 8 (eight) hours as needed for nausea or vomiting. 20 tablet 2   potassium chloride (KLOR-CON M) 10 MEQ tablet Take 2 tablets (20 mEq total) by mouth 2 (two) times daily. 120 tablet 1   prochlorperazine (COMPAZINE) 5 MG tablet Take 1 tablet (5 mg total) by mouth every 6 (six) hours as needed for nausea or vomiting. 60 tablet 1   rosuvastatin (CRESTOR) 10 MG tablet Take 10 mg by mouth at bedtime.     scopolamine (TRANSDERM-SCOP) 1 MG/3DAYS Place 1 patch (1.5 mg total) onto the skin every 3 (three) days. 10 patch 12   traMADol (ULTRAM) 50 MG tablet Take 1-2 tablets (50-100 mg total) by mouth every 6 (six) hours as needed for moderate pain (pain score 4-6) or severe pain (pain score 7-10). 90 tablet 0   No current facility-administered medications for this visit.    HISTORY OF PRESENT ILLNESS:   Oncology History  Melanoma metastatic to liver (HCC)  11/16/2022 Imaging    She presented to the emergency room on 11/16/2022 for back pain x 2 weeks and central chest pain for several months   CTA CAP IMPRESSION: 1. Negative for aortic dissection or aneurysm. 2. 1.2 cm left upper lobe nodule.  For 3. Multiple ill-defined liver lesions, suspicious for metastatic disease. 4. Multiple lytic osseous lesions, suspicious for metastatic disease. Pathologic fracture of the superior endplate of L5   11/25/2022 Pathology Results   1. Liver, needle/core biopsy, Left hepatic lobe lesion :       - METASTATIC MELANOMA.    12/04/2022 Initial Diagnosis   Melanoma of skin (HCC) Liver, bone metastases. 1.2 cm LUL nodule   12/07/2022 Cancer Staging   Staging form: Melanoma of the Skin, AJCC 8th Edition - Clinical: Stage IV (cTX, cN1, pM1d(0)) - Signed by Melven Sartorius, MD on 03/01/2023   12/09/2022 PET scan   PET: Diffuse bone and liver metastases.  Lung metastases.    12/11/2022 -  Chemotherapy   Patient is on Treatment Plan : MELANOMA Nivolumab (1) + Ipilimumab (3) q21d / Nivolumab (480) q28d     12/17/2022 Imaging   MRI brain  1. Three small right hemisphere brain metastases metastases ranging from 2 mm to 4 mm. Minimal hemosiderin. No associated cerebral edema or mass effect. No other metastatic disease identified in the brain.   2. Known osseous metastatic disease but no destructive lesion identified in the skull or visible cervical spine.   3. Moderate for age cerebral white matter changes most commonly due to small vessel disease.   12/17/2022 Imaging   MRI brain 1. Three small right hemisphere brain metastases metastases ranging from 2 mm to 4 mm. Minimal hemosiderin. No associated cerebral edema or mass effect. No other metastatic disease identified in the brain.   2. Known osseous metastatic disease but no destructive lesion identified in the skull or visible cervical spine.   3. Moderate for age cerebral white matter changes most commonly due to small  vessel disease.   02/15/2023 PET scan   PET whole body  1. The degree metastatic disease defined on recent CT scan is less impressive on FDG PET scan. 2. There is intense hypermetabolic lesion in the lateral segment of the LEFT hepatic lobe which corresponds to a large 3.7 cm mass. Additional small hepatic lesions have mild metabolic activity. 3. Very low metabolic activity associated with a LEFT upper lobe pulmonary nodule which is decreased in size. Favor positive response to therapy. 4. Small LEFT hilar hypermetabolic node is indeterminate. 5. Widespread lytic lesions throughout the axillary appendicular skeleton. Majority these lesions do not have hypermetabolic activity. One soft tissue lesion in the LEFT sacral ala does have mild metabolic activity.       REVIEW OF SYSTEMS:   All relevant systems were reviewed with the patient and are negative.   VITALS:  There were no vitals taken for this visit.  Wt Readings from Last 3 Encounters:  03/16/23 105 lb 4.8 oz (47.8 kg)  03/10/23 107 lb (48.5 kg)  03/04/23 107 lb (48.5 kg)    There is no height or weight on file to calculate BMI.  Performance status (ECOG): 3  PHYSICAL EXAM:   GENERAL:alert, no distress and comfortable SKIN: skin color normal, no rashes  EYES: normal, sclera clear LUNGS: clear to auscultation with normal breathing effort.  No wheeze or rales HEART: Tachycardic and no lower extremity edema ABDOMEN: abdomen soft, non-tender and nondistended Musculoskeletal: no edema NEURO: alert, fluent speech, no focal motor/sensory deficits.  Strength and sensation equal bilaterally.  LABORATORY DATA:  I have reviewed the data as listed    Component Value Date/Time   NA 137 03/16/2023 0854   K 3.2 (L) 03/16/2023 0854   CL 105 03/16/2023 0854   CO2 25 03/16/2023 0854   GLUCOSE 138 (H) 03/16/2023 0854   BUN 11 03/16/2023 0854   CREATININE 0.99 03/16/2023 0854   CALCIUM 8.2 (L) 03/16/2023 0854   PROT 5.2 (L)  03/16/2023 0854   ALBUMIN 2.8 (L) 03/16/2023 0854   AST 42 (H) 03/16/2023 0854   ALT 17 03/16/2023 0854   ALKPHOS 298 (H) 03/16/2023 0854   BILITOT 0.8 03/16/2023 0854   GFRNONAA >60 03/16/2023 0854   GFRAA >60 04/20/2018 0823    No results found for: "SPEP", "UPEP"  Lab Results  Component Value Date   WBC 4.9 03/16/2023   NEUTROABS 3.2 03/16/2023   HGB 12.2 03/16/2023   HCT 37.2 03/16/2023   MCV 98.9 03/16/2023   PLT 463 (H) 03/16/2023      Chemistry      Component Value Date/Time   NA 137 03/16/2023 0854  K 3.2 (L) 03/16/2023 0854   CL 105 03/16/2023 0854   CO2 25 03/16/2023 0854   BUN 11 03/16/2023 0854   CREATININE 0.99 03/16/2023 0854      Component Value Date/Time   CALCIUM 8.2 (L) 03/16/2023 0854   ALKPHOS 298 (H) 03/16/2023 0854   AST 42 (H) 03/16/2023 0854   ALT 17 03/16/2023 0854   BILITOT 0.8 03/16/2023 0854       RADIOGRAPHIC STUDIES: I have personally reviewed the radiological images as listed and agreed with the findings in the report. MR Brain W Wo Contrast Result Date: 03/10/2023 CLINICAL DATA:  Brain metastases, assess treatment response. History of melanoma. EXAM: MRI HEAD WITHOUT AND WITH CONTRAST TECHNIQUE: Multiplanar, multiecho pulse sequences of the brain and surrounding structures were obtained without and with intravenous contrast. CONTRAST:  7.12mL GADAVIST GADOBUTROL 1 MMOL/ML IV SOLN COMPARISON:  Head CT 12/27/2022 and MRI 12/17/2022 FINDINGS: Brain: There is no evidence of an acute infarct, midline shift, or extra-axial fluid collection. Cerebral volume is normal. The ventricles are normal in size. Scattered small T2 hyperintensities in the cerebral white matter bilaterally are similar to the prior MRI and are nonspecific but compatible with mild chronic small vessel ischemic disease. The 3 small right frontal lobe metastases described on the prior MRI are stable or slightly smaller in size (series 11 images 27, 32, and 35). However, there are  numerous (at least 14) new punctate intrinsically T1 hyperintense cortical lesions bilaterally, most conspicuous on the precontrast axial T1 sequence and predominantly involving the frontal lobes, and these are annotated with double arrows on series 9. There is no significant brain edema. Vascular: Major intracranial vascular flow voids are preserved. Skull and upper cervical spine: Numerous small skull metastases, increased in size and number from the prior MRI. Sinuses/Orbits: Unremarkable orbits. Paranasal sinuses and mastoid air cells are clear. Other: None. IMPRESSION: 1. At least 14 new punctate brain metastases.  No edema. 2. Stable or slightly decreased size of the 3 pre-existing small right frontal metastases. 3. Increased size and number of small skull metastases. Electronically Signed   By: Sebastian Ache M.D.   On: 03/10/2023 15:53   XR Lumbar Spine Complete Result Date: 03/10/2023 XRs of the lumbar spine from 03/10/2023 were independently reviewed and interpreted, showing scoliosis with apex to the right at L1.  Fracture seen of the L5 vertebral with height loss that measures 52% when compared to the L4 vertebra.  Anterior height loss noted at the L1 vertebra consistent with fracture. No other fractures seen.  No evidence of instability on flexion/extension views.  IR IMAGING GUIDED PORT INSERTION Result Date: 03/05/2023 CLINICAL DATA:  Metastatic melanoma, needs durable venous access for planned treatment regimen EXAM: TUNNELED PORT CATHETER PLACEMENT WITH ULTRASOUND AND FLUOROSCOPIC GUIDANCE FLUOROSCOPY: Radiation Exposure Index (as provided by the fluoroscopic device): Radiation Exposure Index (as provided by the fluoroscopic device): 3 mGy air Kerma ANESTHESIA/SEDATION: Intravenous Fentanyl and Versed 2mg  were administered by RN during a total moderate (conscious) sedation time of 15 minutes; the patient's level of consciousness and physiological / cardiorespiratory status were monitored  continuously by radiology RN under my direct supervision. TECHNIQUE: The procedure, risks, benefits, and alternatives were explained to the patient. Questions regarding the procedure were encouraged and answered. The patient understands and consents to the procedure. Patency of the right IJ vein was confirmed with ultrasound with image documentation. An appropriate skin site was determined. Skin site was marked. Region was prepped using maximum barrier technique including cap  and mask, sterile gown, sterile gloves, large sterile sheet, and Chlorhexidine as cutaneous antisepsis. The region was infiltrated locally with 1% lidocaine. Under real-time ultrasound guidance, the right IJ vein was accessed with a 21 gauge micropuncture needle; the needle tip within the vein was confirmed with ultrasound image documentation. Needle was exchanged over a 018 guidewire for transitional dilator, and vascular measurement was performed. A small incision was made on the right anterior chest wall and a subcutaneous pocket fashioned. The power-injectable port was positioned and its catheter tunneled to the right IJ dermatotomy site. The transitional dilator was exchanged over an Amplatz wire for a peel-away sheath, through which the port catheter, which had been trimmed to the appropriate length, was advanced and positioned under fluoroscopy with its tip at the cavoatrial junction. Spot chest radiograph confirms good catheter position and no pneumothorax. The port was flushed per protocol. The pocket was closed with deep interrupted and subcuticular continuous 3-0 Monocryl sutures. The incisions were covered with Dermabond then covered with a sterile dressing. The patient tolerated the procedure well. COMPLICATIONS: COMPLICATIONS None immediate IMPRESSION: Technically successful right IJ power-injectable port catheter placement. Ready for routine use. Electronically Signed   By: Corlis Leak M.D.   On: 03/05/2023 17:05   NM PET Image  Restage (PS) Whole Body Result Date: 02/18/2023 CLINICAL DATA:  Subsequent treatment strategy for metastatic melanoma. History of brain metastasis EXAM: NUCLEAR MEDICINE PET WHOLE BODY TECHNIQUE: 5.2 mCi F-18 FDG was injected intravenously. Full-ring PET imaging was performed from the head to foot after the radiotracer. CT data was obtained and used for attenuation correction and anatomic localization. Fasting blood glucose: 79 mg/dl COMPARISON:  CT 29/56/2130 FINDINGS: HEAD/NECK: No hypermetabolic activity in the scalp. No hypermetabolic cervical lymph nodes. Incidental CT findings: none CHEST: The largest nodule in the lungs cysts in the LEFT upper lobe measuring 9 mm (image 77/5) compared to 16 mm on prior. There is some decrease in the solidity of the nodule with a rim of peripheral hypodensity. This nodule is very mildly metabolic with SUV max 1.9 which less background blood Smaller pulmonary nodules are not hypermetabolic. Hypermetabolic LEFT hilar lymph node with SUV max equal 3.5 c on image 82. No CT correlation. Incidental CT findings: none ABDOMEN/PELVIS: There is intense metabolic activity associated with the large lesion in the LEFT hepatic lobe lateral segment with SUV max equal 7.3 on image 117. This lesion is evident as a high-density lesion measuring 3.7 cm on CT portion exam (image 116). T He multiple smaller high-density round lesions throughout the liver are less conspicuous on FDG PET imaging. One larger lesions in the medial RIGHT hepatic lobe measuring 2.2 cm on image 119) does have peripheral metabolic activity with SUV max 4.6. Incidental CT findings: none SKELETON: multiple lytic lesions within the spine and pelvis. Soft tissue component in the LEFT sacral ala has mild metabolic activity SUV max equal 3.9 on image 163. The majority of the lytic lesion of significant metabolic activity. Incidental CT findings: none EXTREMITIES: Incidental CT findings: none IMPRESSION: 1. The degree  metastatic disease defined on recent CT scan is less impressive on FDG PET scan. 2. There is intense hypermetabolic lesion in the lateral segment of the LEFT hepatic lobe which corresponds to a large 3.7 cm mass. Additional small hepatic lesions have mild metabolic activity. 3. Very low metabolic activity associated with a LEFT upper lobe pulmonary nodule which is decreased in size. Favor positive response to therapy. 4. Small LEFT hilar hypermetabolic node is  indeterminate. 5. Widespread lytic lesions throughout the axillary appendicular skeleton. Majority these lesions do not have hypermetabolic activity. One soft tissue lesion in the LEFT sacral ala does have mild metabolic activity. Electronically Signed   By: Genevive Bi M.D.   On: 02/18/2023 15:14

## 2023-03-15 NOTE — Assessment & Plan Note (Signed)
LFT improved. PET showed response. Continue monitor. Continue vemurafenib 960 mg twice daily, and cobimetinib 60 mg once daily. Will proceed with cycle 3 Refill of cycle 4 PET scan about end of March CBC, CMP, LDH on  with follow up Will resume nivolumab

## 2023-03-16 ENCOUNTER — Other Ambulatory Visit: Payer: Self-pay

## 2023-03-16 ENCOUNTER — Inpatient Hospital Stay: Payer: BC Managed Care – PPO

## 2023-03-16 ENCOUNTER — Other Ambulatory Visit: Payer: Self-pay | Admitting: *Deleted

## 2023-03-16 ENCOUNTER — Encounter: Payer: Self-pay | Admitting: Nurse Practitioner

## 2023-03-16 ENCOUNTER — Other Ambulatory Visit (HOSPITAL_COMMUNITY): Payer: Self-pay

## 2023-03-16 ENCOUNTER — Telehealth: Payer: Self-pay | Admitting: Pharmacy Technician

## 2023-03-16 ENCOUNTER — Inpatient Hospital Stay (HOSPITAL_BASED_OUTPATIENT_CLINIC_OR_DEPARTMENT_OTHER): Payer: BC Managed Care – PPO | Admitting: Nurse Practitioner

## 2023-03-16 ENCOUNTER — Inpatient Hospital Stay (HOSPITAL_BASED_OUTPATIENT_CLINIC_OR_DEPARTMENT_OTHER): Payer: BC Managed Care – PPO

## 2023-03-16 VITALS — BP 109/83 | HR 145 | Temp 97.6°F | Resp 18 | Wt 105.3 lb

## 2023-03-16 VITALS — BP 125/83 | HR 112 | Resp 18

## 2023-03-16 DIAGNOSIS — Z5112 Encounter for antineoplastic immunotherapy: Secondary | ICD-10-CM | POA: Diagnosis not present

## 2023-03-16 DIAGNOSIS — C7800 Secondary malignant neoplasm of unspecified lung: Secondary | ICD-10-CM | POA: Diagnosis not present

## 2023-03-16 DIAGNOSIS — C7931 Secondary malignant neoplasm of brain: Secondary | ICD-10-CM | POA: Diagnosis not present

## 2023-03-16 DIAGNOSIS — R530 Neoplastic (malignant) related fatigue: Secondary | ICD-10-CM

## 2023-03-16 DIAGNOSIS — R112 Nausea with vomiting, unspecified: Secondary | ICD-10-CM | POA: Diagnosis not present

## 2023-03-16 DIAGNOSIS — R110 Nausea: Secondary | ICD-10-CM

## 2023-03-16 DIAGNOSIS — K219 Gastro-esophageal reflux disease without esophagitis: Secondary | ICD-10-CM | POA: Diagnosis not present

## 2023-03-16 DIAGNOSIS — C787 Secondary malignant neoplasm of liver and intrahepatic bile duct: Secondary | ICD-10-CM

## 2023-03-16 DIAGNOSIS — E860 Dehydration: Secondary | ICD-10-CM

## 2023-03-16 DIAGNOSIS — G893 Neoplasm related pain (acute) (chronic): Secondary | ICD-10-CM

## 2023-03-16 DIAGNOSIS — E876 Hypokalemia: Secondary | ICD-10-CM

## 2023-03-16 DIAGNOSIS — K5900 Constipation, unspecified: Secondary | ICD-10-CM | POA: Diagnosis not present

## 2023-03-16 DIAGNOSIS — Z79620 Long term (current) use of immunosuppressive biologic: Secondary | ICD-10-CM | POA: Diagnosis not present

## 2023-03-16 DIAGNOSIS — Z515 Encounter for palliative care: Secondary | ICD-10-CM | POA: Diagnosis not present

## 2023-03-16 DIAGNOSIS — R634 Abnormal weight loss: Secondary | ICD-10-CM

## 2023-03-16 DIAGNOSIS — R52 Pain, unspecified: Secondary | ICD-10-CM | POA: Diagnosis not present

## 2023-03-16 DIAGNOSIS — C7951 Secondary malignant neoplasm of bone: Secondary | ICD-10-CM | POA: Diagnosis not present

## 2023-03-16 DIAGNOSIS — I10 Essential (primary) hypertension: Secondary | ICD-10-CM | POA: Diagnosis not present

## 2023-03-16 DIAGNOSIS — R630 Anorexia: Secondary | ICD-10-CM

## 2023-03-16 DIAGNOSIS — C439 Malignant melanoma of skin, unspecified: Secondary | ICD-10-CM | POA: Diagnosis not present

## 2023-03-16 LAB — CBC WITH DIFFERENTIAL (CANCER CENTER ONLY)
Abs Immature Granulocytes: 0.02 10*3/uL (ref 0.00–0.07)
Basophils Absolute: 0 10*3/uL (ref 0.0–0.1)
Basophils Relative: 1 %
Eosinophils Absolute: 0.1 10*3/uL (ref 0.0–0.5)
Eosinophils Relative: 2 %
HCT: 37.2 % (ref 36.0–46.0)
Hemoglobin: 12.2 g/dL (ref 12.0–15.0)
Immature Granulocytes: 0 %
Lymphocytes Relative: 25 %
Lymphs Abs: 1.2 10*3/uL (ref 0.7–4.0)
MCH: 32.4 pg (ref 26.0–34.0)
MCHC: 32.8 g/dL (ref 30.0–36.0)
MCV: 98.9 fL (ref 80.0–100.0)
Monocytes Absolute: 0.4 10*3/uL (ref 0.1–1.0)
Monocytes Relative: 7 %
Neutro Abs: 3.2 10*3/uL (ref 1.7–7.7)
Neutrophils Relative %: 65 %
Platelet Count: 463 10*3/uL — ABNORMAL HIGH (ref 150–400)
RBC: 3.76 MIL/uL — ABNORMAL LOW (ref 3.87–5.11)
RDW: 15.7 % — ABNORMAL HIGH (ref 11.5–15.5)
WBC Count: 4.9 10*3/uL (ref 4.0–10.5)
nRBC: 0 % (ref 0.0–0.2)

## 2023-03-16 LAB — CMP (CANCER CENTER ONLY)
ALT: 17 U/L (ref 0–44)
AST: 42 U/L — ABNORMAL HIGH (ref 15–41)
Albumin: 2.8 g/dL — ABNORMAL LOW (ref 3.5–5.0)
Alkaline Phosphatase: 298 U/L — ABNORMAL HIGH (ref 38–126)
Anion gap: 7 (ref 5–15)
BUN: 11 mg/dL (ref 6–20)
CO2: 25 mmol/L (ref 22–32)
Calcium: 8.2 mg/dL — ABNORMAL LOW (ref 8.9–10.3)
Chloride: 105 mmol/L (ref 98–111)
Creatinine: 0.99 mg/dL (ref 0.44–1.00)
GFR, Estimated: >60 mL/min (ref >60)
Glucose, Bld: 138 mg/dL — ABNORMAL HIGH (ref 70–99)
Potassium: 3.2 mmol/L — ABNORMAL LOW (ref 3.5–5.1)
Sodium: 137 mmol/L (ref 135–145)
Total Bilirubin: 0.8 mg/dL (ref 0.0–1.2)
Total Protein: 5.2 g/dL — ABNORMAL LOW (ref 6.5–8.1)

## 2023-03-16 LAB — T4, FREE: Free T4: 1.23 ng/dL — ABNORMAL HIGH (ref 0.61–1.12)

## 2023-03-16 LAB — TSH: TSH: 5.679 u[IU]/mL — ABNORMAL HIGH (ref 0.350–4.500)

## 2023-03-16 LAB — LACTATE DEHYDROGENASE: LDH: 478 U/L — ABNORMAL HIGH (ref 98–192)

## 2023-03-16 MED ORDER — LIDOCAINE-PRILOCAINE 2.5-2.5 % EX CREA
1.0000 | TOPICAL_CREAM | CUTANEOUS | 6 refills | Status: DC | PRN
Start: 2023-03-16 — End: 2023-03-16
  Filled 2023-03-16: qty 30, 10d supply, fill #0

## 2023-03-16 MED ORDER — GLYCOPYRROLATE 1 MG PO TABS
1.0000 mg | ORAL_TABLET | Freq: Two times a day (BID) | ORAL | 2 refills | Status: DC | PRN
  Filled 2023-03-16: qty 30, 15d supply, fill #0
  Filled 2023-05-31: qty 30, 15d supply, fill #1

## 2023-03-16 MED ORDER — SODIUM CHLORIDE 0.9 % IV SOLN: INTRAVENOUS | Status: DC

## 2023-03-16 MED ORDER — LIDOCAINE-PRILOCAINE 2.5-2.5 % EX CREA
1.0000 | TOPICAL_CREAM | CUTANEOUS | 6 refills | Status: DC | PRN
  Filled 2023-03-16: qty 30, 10d supply, fill #0
  Filled 2023-04-26: qty 30, 10d supply, fill #1

## 2023-03-16 MED ORDER — ENCORAFENIB 75 MG PO CAPS: 450.0000 mg | ORAL_CAPSULE | Freq: Every day | ORAL | 11 refills | Status: DC

## 2023-03-16 MED ORDER — POTASSIUM CHLORIDE CRYS ER 20 MEQ PO TBCR
40.0000 meq | EXTENDED_RELEASE_TABLET | Freq: Two times a day (BID) | ORAL | 2 refills | Status: DC
  Filled 2023-03-16: qty 60, 15d supply, fill #0

## 2023-03-16 MED ORDER — SODIUM CHLORIDE 0.9% FLUSH
10.0000 mL | Freq: Once | INTRAVENOUS | Status: AC | PRN
  Administered 2023-03-16: 10 mL

## 2023-03-16 MED ORDER — SODIUM CHLORIDE 0.9 % IV SOLN
240.0000 mg | Freq: Once | INTRAVENOUS | Status: AC
  Administered 2023-03-16: 240 mg via INTRAVENOUS
  Filled 2023-03-16: qty 24

## 2023-03-16 MED ORDER — POTASSIUM CHLORIDE CRYS ER 10 MEQ PO TBCR: 20.0000 meq | EXTENDED_RELEASE_TABLET | Freq: Two times a day (BID) | ORAL | 1 refills | Status: DC

## 2023-03-16 MED ORDER — POTASSIUM CHLORIDE CRYS ER 10 MEQ PO TBCR
20.0000 meq | EXTENDED_RELEASE_TABLET | Freq: Two times a day (BID) | ORAL | 1 refills | Status: DC
  Filled 2023-03-16: qty 35, 9d supply, fill #0
  Filled 2023-03-16: qty 85, 21d supply, fill #0
  Filled 2023-04-12: qty 120, 30d supply, fill #1

## 2023-03-16 MED ORDER — HEPARIN SOD (PORK) LOCK FLUSH 100 UNIT/ML IV SOLN
500.0000 [IU] | Freq: Once | INTRAVENOUS | Status: AC | PRN
  Administered 2023-03-16: 500 [IU]

## 2023-03-16 MED ORDER — SODIUM CHLORIDE 0.9 % IV SOLN: INTRAVENOUS | Status: AC

## 2023-03-16 MED ORDER — BINIMETINIB 15 MG PO TABS: 45.0000 mg | ORAL_TABLET | Freq: Two times a day (BID) | ORAL | 11 refills | Status: DC

## 2023-03-16 MED ORDER — SODIUM CHLORIDE 0.9% FLUSH
10.0000 mL | INTRAVENOUS | Status: DC | PRN
  Administered 2023-03-16: 10 mL

## 2023-03-16 NOTE — Patient Instructions (Signed)
 [facility] - A DEPT OF [facility]  Discharge Instructions: Thank you for choosing [facility] to provide your oncology and hematology care.   If you have a lab appointment with the Cancer Center, please go directly to the Cancer Center and check in at the registration area.   Wear comfortable clothing and clothing appropriate for easy access to any Portacath or PICC line.   We strive to give you quality time with your provider. You may need to reschedule your appointment if you arrive late (15 or more minutes).  Arriving late affects you and other patients whose appointments are after yours.  Also, if you miss three or more appointments without notifying the office, you may be dismissed from the clinic at the provider's discretion.      For prescription refill requests, have your pharmacy contact our office and allow 72 hours for refills to be completed.    Today you received the following chemotherapy and/or immunotherapy agents nivolumab      To help prevent nausea and vomiting after your treatment, we encourage you to take your nausea medication as directed.  BELOW ARE SYMPTOMS THAT SHOULD BE REPORTED IMMEDIATELY: *FEVER GREATER THAN 100.4 F (38 C) OR HIGHER *CHILLS OR SWEATING *NAUSEA AND VOMITING THAT IS NOT CONTROLLED WITH YOUR NAUSEA MEDICATION *UNUSUAL SHORTNESS OF BREATH *UNUSUAL BRUISING OR BLEEDING *URINARY PROBLEMS (pain or burning when urinating, or frequent urination) *BOWEL PROBLEMS (unusual diarrhea, constipation, pain near the anus) TENDERNESS IN MOUTH AND THROAT WITH OR WITHOUT PRESENCE OF ULCERS (sore throat, sores in mouth, or a toothache) UNUSUAL RASH, SWELLING OR PAIN  UNUSUAL VAGINAL DISCHARGE OR ITCHING   Items with * indicate a potential emergency and should be followed up as soon as possible or go to the Emergency Department if any problems should occur.  Please show the CHEMOTHERAPY ALERT CARD or IMMUNOTHERAPY  ALERT CARD at check-in to the Emergency Department and triage nurse.  Should you have questions after your visit or need to cancel or reschedule your appointment, please contact [facility] - A DEPT OF [facility]  Dept: [phone]  and follow the prompts.  Office hours are 8:00 a.m. to 4:30 p.m. Monday - Friday. Please note that voicemails left after 4:00 p.m. may not be returned until the following business day.  We are closed weekends and major holidays. You have access to a nurse at all times for urgent questions. Please call the main number to the clinic Dept: [phone] and follow the prompts.   For any non-urgent questions, you may also contact your provider using MyChart. We now offer e-Visits for anyone 28 and older to request care online for non-urgent symptoms. For details visit [email or web address].   Also download the MyChart app! Go to the app store, search "MyChart", open the app, select [place], and log in with your MyChart username and password.

## 2023-03-16 NOTE — Telephone Encounter (Signed)
Oral Oncology Patient Advocate Encounter   Received notification that prior authorization for Braftovi/Mektovi is required.   PA submitted on 03/16/23 Key [id number] [facility]) Key [id number] Johney Maine) Status is pending     Jinger Neighbors, CPhT-Adv Oncology Pharmacy Patient Advocate [facility] Direct Number: [phone]  Fax: [phone]

## 2023-03-16 NOTE — Telephone Encounter (Signed)
Oral Oncology Patient Advocate Encounter  Received notification that the request for prior authorization for Mekinist/Tafinlar has been denied due to patient must try and fail ALL preferred products before trying Mekinist/Tafinlar. Preferred products are Cotellic/Zelboraf and Braftovi/Mektovi.   Jinger Neighbors, CPhT-Adv Oncology Pharmacy Patient Advocate [facility] Direct Number: [phone]  Fax: [phone]

## 2023-03-16 NOTE — Telephone Encounter (Signed)
Oral Oncology Patient Advocate Encounter  Prior Authorization for Braftovi/Mektovi has been approved.    PA# [id number] Johney Maine) PA# [id number] (braftovi) Effective dates: 03/16/23 through 03/15/24  Patient is required to fill at [facility].    Jinger Neighbors, CPhT-Adv Oncology Pharmacy Patient Advocate [facility] Direct Number: [phone]  Fax: [phone]

## 2023-03-16 NOTE — Assessment & Plan Note (Signed)
Due to nausea and vomiting IVF 1 L NS today.

## 2023-03-16 NOTE — Telephone Encounter (Signed)
Oral Oncology Pharmacist Encounter  Received new prescription for Mektovi (binimetinib) and Braftovi (encorafenib) for the treatment of metastatic melanoma with BRAF positivity, planned duration until disease progression or unacceptable toxicity. Switching therapy due to incomplete CNS efficacy.   Labs from 03/16/23 (CBC and CMP) assessed, no interventions needed for either medication. Prescription doses and frequencies assessed for appropriateness.   Current medication list in Epic reviewed, no significant/ relevant DDIs with Mektovi identified. Current medication list in Epic reviewed, DDIs with Braftovi identified: - dronabinol, fentanyl, tramadol (cat C): braftovi may decrease the serum concentration of dronabinol, fentanyl and tramadol. Will have patient monitor for any reduced efficacy of these medications. - rosuvastatin (cat C): braftovi may increase the serum concentration of rosuvastatin. Will notify patient to monitor for any increase myalgias.   Evaluated chart and no patient barriers to medication adherence noted.   Patient agreement for treatment documented in MD note on 03/16/2023.  Prescription has been e-scribed to the [facility] for benefits analysis and approval.  Oral Oncology Clinic will continue to follow for insurance authorization, copayment issues, initial counseling and start date.  Laura [name], PharmD Hematology/Oncology Clinical Pharmacist [facility] Oral Chemotherapy Navigation Clinic [phone] 03/16/2023 8:59 AM

## 2023-03-16 NOTE — Assessment & Plan Note (Signed)
Possibly due to cobimetinib since she feels better on the week off. Treatment will be changed to an encorafenib with binimetinib Continue antiemetic per palliative care.

## 2023-03-16 NOTE — Progress Notes (Signed)
Per Cherly Hensen, MD, okay to treat today with HR 145 with additional IVF to be given concurrently with txt  During Nivolumab infusion, pt c/o blurry vision. VSS, HR improved to 112. Cherly Hensen, MD notified and plan to proceed with Nivolumab infusion with no changes.

## 2023-03-16 NOTE — Assessment & Plan Note (Signed)
Potassium chloride 20 mEq twice daily

## 2023-03-19 ENCOUNTER — Other Ambulatory Visit: Payer: Self-pay

## 2023-03-19 ENCOUNTER — Other Ambulatory Visit: Payer: Self-pay | Admitting: Nurse Practitioner

## 2023-03-19 ENCOUNTER — Telehealth: Payer: Self-pay

## 2023-03-19 ENCOUNTER — Other Ambulatory Visit (HOSPITAL_COMMUNITY): Payer: Self-pay

## 2023-03-19 ENCOUNTER — Inpatient Hospital Stay (HOSPITAL_BASED_OUTPATIENT_CLINIC_OR_DEPARTMENT_OTHER): Payer: BC Managed Care – PPO

## 2023-03-19 VITALS — BP 114/74 | HR 107 | Temp 97.2°F | Resp 17 | Ht 63.0 in | Wt 107.3 lb

## 2023-03-19 DIAGNOSIS — Z79620 Long term (current) use of immunosuppressive biologic: Secondary | ICD-10-CM | POA: Diagnosis not present

## 2023-03-19 DIAGNOSIS — K5900 Constipation, unspecified: Secondary | ICD-10-CM | POA: Diagnosis not present

## 2023-03-19 DIAGNOSIS — R110 Nausea: Secondary | ICD-10-CM

## 2023-03-19 DIAGNOSIS — C787 Secondary malignant neoplasm of liver and intrahepatic bile duct: Secondary | ICD-10-CM

## 2023-03-19 DIAGNOSIS — R112 Nausea with vomiting, unspecified: Secondary | ICD-10-CM | POA: Diagnosis not present

## 2023-03-19 DIAGNOSIS — E860 Dehydration: Secondary | ICD-10-CM | POA: Diagnosis not present

## 2023-03-19 DIAGNOSIS — R52 Pain, unspecified: Secondary | ICD-10-CM | POA: Diagnosis not present

## 2023-03-19 DIAGNOSIS — Z5112 Encounter for antineoplastic immunotherapy: Secondary | ICD-10-CM | POA: Diagnosis not present

## 2023-03-19 DIAGNOSIS — C7931 Secondary malignant neoplasm of brain: Secondary | ICD-10-CM | POA: Diagnosis not present

## 2023-03-19 DIAGNOSIS — C7800 Secondary malignant neoplasm of unspecified lung: Secondary | ICD-10-CM | POA: Diagnosis not present

## 2023-03-19 DIAGNOSIS — G4700 Insomnia, unspecified: Secondary | ICD-10-CM

## 2023-03-19 DIAGNOSIS — Z515 Encounter for palliative care: Secondary | ICD-10-CM | POA: Diagnosis not present

## 2023-03-19 DIAGNOSIS — C7951 Secondary malignant neoplasm of bone: Secondary | ICD-10-CM | POA: Diagnosis not present

## 2023-03-19 DIAGNOSIS — I10 Essential (primary) hypertension: Secondary | ICD-10-CM | POA: Diagnosis not present

## 2023-03-19 DIAGNOSIS — K219 Gastro-esophageal reflux disease without esophagitis: Secondary | ICD-10-CM | POA: Diagnosis not present

## 2023-03-19 DIAGNOSIS — C439 Malignant melanoma of skin, unspecified: Secondary | ICD-10-CM | POA: Diagnosis not present

## 2023-03-19 DIAGNOSIS — E876 Hypokalemia: Secondary | ICD-10-CM | POA: Diagnosis not present

## 2023-03-19 MED ORDER — SODIUM CHLORIDE 0.9% FLUSH
10.0000 mL | Freq: Once | INTRAVENOUS | Status: AC | PRN
  Administered 2023-03-19: 10 mL

## 2023-03-19 MED ORDER — HEPARIN SOD (PORK) LOCK FLUSH 100 UNIT/ML IV SOLN
500.0000 [IU] | Freq: Once | INTRAVENOUS | Status: AC | PRN
Start: 2023-03-19 — End: 2023-03-19
  Administered 2023-03-19: 500 [IU]

## 2023-03-19 MED ORDER — SODIUM CHLORIDE 0.9 % IV SOLN: INTRAVENOUS | Status: AC

## 2023-03-19 MED ORDER — DEXAMETHASONE SODIUM PHOSPHATE 10 MG/ML IJ SOLN
4.0000 mg | Freq: Once | INTRAMUSCULAR | Status: AC
Start: 2023-03-19 — End: 2023-03-19
  Administered 2023-03-19: 4 mg via INTRAVENOUS

## 2023-03-19 MED ORDER — ZOLPIDEM TARTRATE ER 12.5 MG PO TBCR
12.5000 mg | EXTENDED_RELEASE_TABLET | Freq: Every evening | ORAL | 2 refills | Status: DC | PRN
Start: 2023-03-19 — End: 2023-04-29
  Filled 2023-03-19: qty 30, 30d supply, fill #0
  Filled 2023-04-18: qty 30, 30d supply, fill #1

## 2023-03-19 NOTE — Patient Instructions (Signed)

## 2023-03-19 NOTE — Telephone Encounter (Signed)
LATE ENTRY for 03/18/23 afternoon:   Pt's husband Jorja Loa called to report that [facility] told him they needed more info from MD before approving and sending her Mektovi. Reached out to Omer Jack, CPhT, and she states that Johney Maine has been approved and due to be delivered to pt on 03/22/23. TC to pt's husband to inform of this. He states that he was notified that Katherine Roan is due to arrive on 03/25/23, and he asks when pt should begin this new treatment regimen since the meds won't be arriving together. Advised to continue current regimen (as advised by Dr. Cherly Hensen) until both the [facility] and [place] can be given on the same day. He verbalizes understanding and is informed to call office for any questions or concerns.

## 2023-03-22 ENCOUNTER — Other Ambulatory Visit (HOSPITAL_COMMUNITY): Payer: Self-pay

## 2023-03-24 ENCOUNTER — Inpatient Hospital Stay: Payer: BC Managed Care – PPO

## 2023-03-24 DIAGNOSIS — C7951 Secondary malignant neoplasm of bone: Secondary | ICD-10-CM | POA: Diagnosis not present

## 2023-03-24 DIAGNOSIS — R52 Pain, unspecified: Secondary | ICD-10-CM | POA: Diagnosis not present

## 2023-03-24 DIAGNOSIS — E876 Hypokalemia: Secondary | ICD-10-CM | POA: Diagnosis not present

## 2023-03-24 DIAGNOSIS — C7800 Secondary malignant neoplasm of unspecified lung: Secondary | ICD-10-CM | POA: Diagnosis not present

## 2023-03-24 DIAGNOSIS — E860 Dehydration: Secondary | ICD-10-CM | POA: Diagnosis not present

## 2023-03-24 DIAGNOSIS — Z79620 Long term (current) use of immunosuppressive biologic: Secondary | ICD-10-CM | POA: Diagnosis not present

## 2023-03-24 DIAGNOSIS — K219 Gastro-esophageal reflux disease without esophagitis: Secondary | ICD-10-CM | POA: Diagnosis not present

## 2023-03-24 DIAGNOSIS — C787 Secondary malignant neoplasm of liver and intrahepatic bile duct: Secondary | ICD-10-CM | POA: Diagnosis not present

## 2023-03-24 DIAGNOSIS — R112 Nausea with vomiting, unspecified: Secondary | ICD-10-CM | POA: Diagnosis not present

## 2023-03-24 DIAGNOSIS — C439 Malignant melanoma of skin, unspecified: Secondary | ICD-10-CM | POA: Diagnosis not present

## 2023-03-24 DIAGNOSIS — C7931 Secondary malignant neoplasm of brain: Secondary | ICD-10-CM | POA: Diagnosis not present

## 2023-03-24 DIAGNOSIS — Z515 Encounter for palliative care: Secondary | ICD-10-CM | POA: Diagnosis not present

## 2023-03-24 DIAGNOSIS — K5900 Constipation, unspecified: Secondary | ICD-10-CM | POA: Diagnosis not present

## 2023-03-24 DIAGNOSIS — Z5112 Encounter for antineoplastic immunotherapy: Secondary | ICD-10-CM | POA: Diagnosis not present

## 2023-03-24 DIAGNOSIS — I10 Essential (primary) hypertension: Secondary | ICD-10-CM | POA: Diagnosis not present

## 2023-03-24 MED ORDER — SODIUM CHLORIDE 0.9 % IV SOLN: INTRAVENOUS | Status: AC

## 2023-03-24 NOTE — Telephone Encounter (Signed)
 Oral Chemotherapy Pharmacist Encounter  I spoke with patient for overview of: Braftovi and Mektovi for the treatment of metastatic melanoma with BRAF positivity, planned duration until disease progression or unacceptable toxicity. Switching therapy due to incomplete CNS efficacy.    Counseled patient on administration, dosing, side effects, monitoring, drug-food interactions, safe handling, storage, and disposal.  Patient will take Braftovi 75 mg capsules, 6 capsules (450 mg) by mouth once daily, without regard to food. Patient will take Mektovi 15 mg tablets, 3 tablets (45 mg) by mouth 2 times daily, without regard to food, approximately 12 hours apart.  Patient knows to avoid grapefruit and grapefruit juice while on therapy with Braftovi and Mektovi.  Braftovi and Mektovi start date: 03/24/2023  Adverse effects of Braftovi include but are not limited to: Fatigue, nausea, vomiting, abdominal pain, arthralgia, headache, dizziness, hyperkeratosis or HFS, uveitis (eye pain, photophobia, vision changes), hemorrhage, and QTc prolongation.  Adverse effects of Mektovi include but are not limited to: Fatigue, nausea, vomiting, diarrhea, increased creatinine, increased LFTs, fever, rash, vision changes, peripheral edema, cardiac dysfunction, and hemorrhage  Patient has antinausea medication at the house and will use it if symptoms develop.  Reviewed with patient importance of keeping a medication schedule and plan for any missed doses. No barriers to medication adherence identified.  Patient understands that Braftovi and Laura [name] are used in combination. Medication reconciliation performed and medication/allergy list updated.  All questions answered. Patient voiced understanding and appreciation.  Medication education handout placed in mail for patient. Patient knows to call the office with questions or concerns. Oral Chemotherapy Clinic phone number provided to patient.   Laura [name],  PharmD Hematology/Oncology Clinical Pharmacist [facility] [phone]

## 2023-03-25 ENCOUNTER — Other Ambulatory Visit (HOSPITAL_COMMUNITY): Payer: Self-pay

## 2023-03-29 ENCOUNTER — Telehealth: Payer: Self-pay

## 2023-03-29 ENCOUNTER — Other Ambulatory Visit (HOSPITAL_COMMUNITY): Payer: Self-pay

## 2023-03-29 NOTE — Progress Notes (Unsigned)
 Palliative Medicine [facility]  [phone] [phone]   Name: Laura [name] Date: 03/29/2023 MRN: [id number]  DOB: Jun 27, 1965  Patient Care Team: [name], [name] as PCP - General (Family Medicine) Pickenpack-Cousar, Arty Baumgartner, NP as Nurse Practitioner [facility] and Palliative Medicine)   I connected with Laura [name] on 03/29/23 at 10:30 AM EST by phone and verified that I am speaking with the correct person using two identifiers.   I discussed the limitations, risks, security and privacy concerns of performing an evaluation and management service by telemedicine and the availability of in-person appointments. I also discussed with the patient that there may be a patient responsible charge related to this service. The patient expressed understanding and agreed to proceed.   Other persons participating in the visit and their role in the encounter: Laura [name] (spouse)   Patient's location: home  Provider's location: [facility]   Chief Complaint: follow up of symptom management    INTERVAL HISTORY: Laura [name] is a 58 y.o. female with oncologic medical history including malignant melanoma (10/2022) with metastatic disease to the bone. As well as a history of hypertension, hyperlipidemia, GERD, and arthritis. Palliative ask to see for symptom management and goals of care.   SOCIAL HISTORY:     reports that she quit smoking about 7 years ago. Her smoking use included cigarettes. She started smoking about 27 years ago. She has a 20 pack-year smoking history. She has never used smokeless tobacco. She reports that she does not drink alcohol and does not use drugs.  ADVANCE DIRECTIVES:  None on file  CODE STATUS: Full code  PAST MEDICAL HISTORY: Past Medical History:  Diagnosis Date   Anxiety    Arthritis    Complication of anesthesia    Fibromyalgia    Gallstones 04/20/2018   GERD (gastroesophageal reflux disease)    History of blood  transfusion    post Hysterectomy   Hypercholesteremia    Melanoma (HCC)    on back and excised   Migraine    PONV (postoperative nausea and vomiting)    Tobacco abuse    Vertebral artery disease (HCC)    history of left vertebral artery dissection 2011    ALLERGIES:  is allergic to aspirin.  MEDICATIONS:  Current Outpatient Medications  Medication Sig Dispense Refill   binimetinib (MEKTOVI) 15 MG tablet Take 3 tablets (45 mg total) by mouth 2 (two) times daily. 180 tablet 11   docusate sodium (COLACE) 100 MG capsule Take 100-200 mg by mouth See admin instructions. 200 mg in the morning, 100 mg at bedtime     dronabinol (MARINOL) 10 MG capsule Take 1 capsule (10 mg total) by mouth 3 (three) times daily. (Patient taking differently: Take 10 mg by mouth 2 (two) times daily before a meal.) 90 capsule 0   encorafenib (BRAFTOVI) 75 MG capsule Take 6 capsules (450 mg total) by mouth daily. 180 capsule 11   famotidine (PEPCID) 20 MG tablet Take 20 mg by mouth 2 (two) times daily.     fentaNYL (DURAGESIC) 12 MCG/HR Place 1 patch onto the skin every 3 (three) days. 10 patch 0   gabapentin (NEURONTIN) 100 MG capsule Take 1 capsule (100 mg total) by mouth 2 (two) times daily. 60 capsule 0   glycopyrrolate (ROBINUL) 1 MG tablet Take 1 tablet (1 mg total) by mouth 2 (two) times daily as needed. 30 tablet 2   lidocaine-prilocaine (EMLA) cream Apply to port site 30-45 min  prior to appointment. 30 g 6   metoCLOPramide (REGLAN) 10 MG tablet Take 1 tablet (10 mg total) by mouth 3 (three) times daily before meals. 90 tablet 1   ondansetron (ZOFRAN-ODT) 8 MG disintegrating tablet Dissolve 1 tablet (8 mg total) by mouth every 8 (eight) hours as needed for nausea or vomiting. 20 tablet 2   potassium chloride (KLOR-CON M) 10 MEQ tablet Take 2 tablets (20 mEq total) by mouth 2 (two) times daily. 120 tablet 1   potassium chloride (KLOR-CON M) 10 MEQ tablet Take 2 tablets (20 mEq total) by mouth 2 (two) times  daily. 120 tablet 1   prochlorperazine (COMPAZINE) 5 MG tablet Take 1 tablet (5 mg total) by mouth every 6 (six) hours as needed for nausea or vomiting. 60 tablet 1   rosuvastatin (CRESTOR) 10 MG tablet Take 10 mg by mouth at bedtime.     scopolamine (TRANSDERM-SCOP) 1 MG/3DAYS Place 1 patch (1.5 mg total) onto the skin every 3 (three) days. 10 patch 12   traMADol (ULTRAM) 50 MG tablet Take 1-2 tablets (50-100 mg total) by mouth every 6 (six) hours as needed for moderate pain (pain score 4-6) or severe pain (pain score 7-10). 90 tablet 0   zolpidem (AMBIEN CR) 12.5 MG CR tablet Take 1 tablet (12.5 mg total) by mouth at bedtime as needed for sleep. 30 tablet 2   No current facility-administered medications for this visit.    VITAL SIGNS: There were no vitals taken for this visit. There were no vitals filed for this visit.  Estimated body mass index is 19.01 kg/m as calculated from the following:   Height as of 03/19/23: 5\' 3"  (1.6 m).   Weight as of 03/19/23: 107 lb 4.8 oz (48.7 kg).   PERFORMANCE STATUS (ECOG) : 1 - Symptomatic but completely ambulatory  Discussed the use of AI scribe software for clinical note transcription with the patient, who gave verbal consent to proceed.   IMPRESSION:  I connected by phone with Laura [name]. No acute distress noted. Patient is doing well overall compared to previous days. Reports ongoing improvement in nausea and vomiting. Laura [name] shares he has returned back to work after being on leave since October 2024. Patient is doing well at home with support as needed. Laura [name] is able to watch over patient and communicate via in-home cameras when needed.    She has been experiencing nausea that occurs in waves, with a significant episode on Saturday that resolved by bedtime and has not recurred. She manages her nausea by humming to distract herself when symptoms arise. After discussions with pharmacy Laura [name] shares it has been requested to  discontinue use of Reglan due to contraindications with treatment. Recommendations are for use of Olanzapine. Pharmacy notes have been reviewed and patient/husband verbalized understanding of change. Education provided on use of Olanzapine and potential side effects.   On Saturday morning, Kiaira vomited after taking her oral chemotherapy, leading to a shortage of nine pills as her husband readministered the pills seeing all were in content of emesis.   Mrs. Wardell pain is well controlled on current pain regimen which includes Fentanyl patch and Tramadol as needed for breakthrough pain.   She experienced a one-time episode of diarrhea with some blood, which her caregiver suspects may be related to a new medication. This episode has not recurred. They are aware if this occurs again to contact medical team.   All questions answered and support provided.  Assessment and  Plan  Cancer-related Nausea Intermittent nausea, no recent vomiting. Previous use of Reglan and Marinol. Discussed potential side effects of olanzapine. -Discontinue Reglan due to contraindications per Pharmacist with medication.  -Start Olanzapine (Zyprexa) 2.5mg  at bedtime for nausea. Monitor for any adverse effects.  -Continue Marinol as previously prescribed.  Medication Adherence Difficulty with medication adherence due to nausea and vomiting. -Note that patient is short 9 doses of oral chemotherapy due to vomiting after administration  Follow-up Monitor patient's response to new medication and manage any side effects. -Contact healthcare team if any issues arise with olanzapine or if nausea persists. -I will plan to see patient back in clinic in 3-4 weeks. Sooner if needed.   Patient expressed understanding and was in agreement with this plan. She also understands that She can call the clinic at any time with any questions, concerns, or complaints.   Any controlled substances utilized were prescribed in the context  of palliative care. PDMP has been reviewed.   I provided 25 minutes of non face-to-face telephone visit time during this encounter, and > 50% was spent counseling as documented under my assessment & plan.  Visit consisted of counseling and education dealing with the complex and emotionally intense issues of symptom management and palliative care in the setting of serious and potentially life-threatening illness.  Willette Alma, AGPCNP-BC  Palliative Medicine Team/[facility]

## 2023-03-29 NOTE — Telephone Encounter (Addendum)
 Oral Oncology Pharmacist Encounter   Received message from MD regarding administration of the medication to help with the nausea in addition to decreasing vomiting due to the pill burden. Spoke with patients husband about the administration of the mektovi and the braftovi and we discussed how to separate the pills in addition to some other ways to take them with applesauce, yogurt, or pudding.   Since she has nausea/ vomiting that is uncontrolled with zofran/ compazine/ reglan spoke to MD about trying olanzapine at bedtime to help with the nausea. MD agreed and will send in the low dose olanzapine to see if that helps better. Patients husband made aware and knows to stop taking reglan due to drug interaction. We also discussed taking the olanzapine at bedtime and not at the same time as with her other medications at night to help reduce additional drug interactions.  Patients husband agreed with the plan and Dr. Cherly Hensen will send in the prescription for the olanzapine.   [name], PharmD Hematology/Oncology Clinical Pharmacist [facility] [phone]

## 2023-03-29 NOTE — Telephone Encounter (Signed)
 TC to pt's husband after seeing that he left a MyChart message on 3/1, stating that pt had bloody diarrhea. Informed Mr. Graham that if there is an issue on a weekend, he should call the main number [phone]) to speak w/ the on call RN, and he verbalizes understanding. He reports that pt had several episodes of vomiting and diarrhea on Sat 3/1, and bright red blood was noticed "a few times" in her stool, although he wouldn't consider the amount significant. He states that the blood in her stool subsided soon after he left the message on MyChart, and she did not have any episodes yesterday or today so far. Pt denied having abdominal pain/cramping w/ bloody diarrhea. Pt's husband also reported that she vomited immediately after taking Braftovi (6 pills) on 3/1, so he gave her 3 more pills which she also vomited up. She was able to tolerate Braftovi/Mektovi on 3/2 and she has not attempted to take these medications yet today. Advised pt to take zofran or compazine approx 45 minutes prior to taking Braftovi/Mektovi to help prevent vomiting. Informed Mr. Fuston that Dr. Cherly Hensen would be informed of pt's bloody diarrhea over the weekend.

## 2023-03-30 ENCOUNTER — Other Ambulatory Visit: Payer: BC Managed Care – PPO

## 2023-03-30 ENCOUNTER — Other Ambulatory Visit (HOSPITAL_COMMUNITY): Payer: Self-pay

## 2023-03-30 ENCOUNTER — Encounter: Payer: Self-pay | Admitting: Nurse Practitioner

## 2023-03-30 ENCOUNTER — Ambulatory Visit: Payer: BC Managed Care – PPO

## 2023-03-30 ENCOUNTER — Inpatient Hospital Stay: Payer: BC Managed Care – PPO | Attending: Physician Assistant | Admitting: Nurse Practitioner

## 2023-03-30 ENCOUNTER — Other Ambulatory Visit: Payer: Self-pay

## 2023-03-30 DIAGNOSIS — D529 Folate deficiency anemia, unspecified: Secondary | ICD-10-CM | POA: Insufficient documentation

## 2023-03-30 DIAGNOSIS — R110 Nausea: Secondary | ICD-10-CM

## 2023-03-30 DIAGNOSIS — C7931 Secondary malignant neoplasm of brain: Secondary | ICD-10-CM | POA: Insufficient documentation

## 2023-03-30 DIAGNOSIS — R112 Nausea with vomiting, unspecified: Secondary | ICD-10-CM | POA: Insufficient documentation

## 2023-03-30 DIAGNOSIS — Z515 Encounter for palliative care: Secondary | ICD-10-CM

## 2023-03-30 DIAGNOSIS — C7951 Secondary malignant neoplasm of bone: Secondary | ICD-10-CM | POA: Insufficient documentation

## 2023-03-30 DIAGNOSIS — G893 Neoplasm related pain (acute) (chronic): Secondary | ICD-10-CM | POA: Diagnosis not present

## 2023-03-30 DIAGNOSIS — C439 Malignant melanoma of skin, unspecified: Secondary | ICD-10-CM | POA: Insufficient documentation

## 2023-03-30 DIAGNOSIS — C787 Secondary malignant neoplasm of liver and intrahepatic bile duct: Secondary | ICD-10-CM | POA: Diagnosis not present

## 2023-03-30 DIAGNOSIS — Z79620 Long term (current) use of immunosuppressive biologic: Secondary | ICD-10-CM | POA: Insufficient documentation

## 2023-03-30 DIAGNOSIS — Z5112 Encounter for antineoplastic immunotherapy: Secondary | ICD-10-CM | POA: Insufficient documentation

## 2023-03-30 DIAGNOSIS — R530 Neoplastic (malignant) related fatigue: Secondary | ICD-10-CM

## 2023-03-30 MED ORDER — OLANZAPINE 2.5 MG PO TABS: 2.5000 mg | ORAL_TABLET | Freq: Every day | ORAL | 1 refills | Status: DC

## 2023-03-30 MED ORDER — OLANZAPINE 2.5 MG PO TABS
2.5000 mg | ORAL_TABLET | Freq: Every day | ORAL | 1 refills | Status: DC
Start: 2023-03-30 — End: 2023-05-28
  Filled 2023-03-30: qty 30, 30d supply, fill #0
  Filled 2023-04-22: qty 30, 30d supply, fill #1

## 2023-03-31 DIAGNOSIS — C439 Malignant melanoma of skin, unspecified: Secondary | ICD-10-CM | POA: Diagnosis not present

## 2023-03-31 DIAGNOSIS — R0602 Shortness of breath: Secondary | ICD-10-CM | POA: Diagnosis not present

## 2023-03-31 DIAGNOSIS — C7931 Secondary malignant neoplasm of brain: Secondary | ICD-10-CM | POA: Diagnosis not present

## 2023-03-31 NOTE — Progress Notes (Unsigned)
 Patient Care Team: [name], [name] as PCP - General (Family Medicine) Pickenpack-Cousar, Arty Baumgartner, NP as Nurse Practitioner [facility] and Palliative Medicine)  Clinic Day:  04/01/2023  Referring physician: Melven Sartorius, MD  ASSESSMENT & PLAN:   Assessment & Plan: Diagnosis: 11/25/2022. Diffuse liver, bone, lung metastases. Brain metastases after starting systemic treatment Treatment: 12/11/2022. First line ipi/nivo. Completed 2 cycles. 12/14/22-12/28/22 radiation to L-spine 30Gy/32fx 12/14  vemurafenib and cobimetinib due to disease uncontrolled or worsening symptoms, liver metastases   Discussed results from MRI of the brain.  Report new lesions along with smaller lesions previous identified.  Timing of last MRI to initial start of targeted therapy was about 3 weeks during rapid progression clinically at the time.     The multiple brain metastasis can be life threatening and I am concerning about activities vemurafenib/cobimetinib may not be enough for CNS efficacy.  She switched to encorafenib and binimetinib.   2/26 C1D1 Braftovi and Mektovi  Nausea once since switching to olanzapine.  Discussed her progression with husband, and son in the room today.  Discussed her liver function was improved.  Clinically, liver metastases is under control currently.  Discussed brain metastases can be difficult at times to control.  BRAF and MEC inhibitors may provide CNS penetration.  However, if this does not work, next treatment with Relatimab nivolumab may be considered but activity may be limited.  We will proceed with restaging in early April.  MRI and PET/CT ordered.  Melanoma metastatic to liver [facility]) LFT improved. PET showed response. Continue monitor. encorafenib  450 mg once daily with binimetinib 45 mg twice daily. PET scan about end of March, ordered today CBC, CMP, LDH on  with follow up Continue nivolumab  Nausea with vomiting Starting olanzapine Continue scopolamine  patch, as needed Compazine Refilled Zofran today  Decreased appetite Continues Marinol to 10 mg twice daily.   Dyspepsia Will use Pepcid 20 mg twice daily instead of PPI.   Continue follow-up every 3 months.  IV fluid once weekly.  Continue follow-up with palliative care.  Total time 44 minutes. The patient understands the plans discussed today and is in agreement with them.  She knows to contact our office if she develops concerns prior to her next appointment.  Melven Sartorius, MD  [facility] - [facility] DEPT [facility] [address] [place] [address] Dept: [phone] Dept Fax: [phone]   Orders Placed This Encounter  Procedures   NM PET Image Initial (PI) Whole Body    Standing Status:   Future    Expected Date:   04/29/2023    Expiration Date:   03/30/2024    If indicated for the ordered procedure, I authorize the administration of a radiopharmaceutical per Radiology protocol:   Yes    Is the patient pregnant?:   No    Preferred imaging location?:   [facility]   MR Brain W Wo Contrast    Standing Status:   Future    Expected Date:   04/30/2023    Expiration Date:   03/31/2024    If indicated for the ordered procedure, I authorize the administration of contrast media per Radiology protocol:   Yes    What is the patient's sedation requirement?:   Anti-anxiety    Does the patient have a pacemaker or implanted devices?:   No    Use SRS Protocol?:   Yes    Preferred  imaging location?:   [facility] (table limit - 550 lbs)      CHIEF COMPLAINT:  CC: mMelanoma  Current Treatment:  nivo/bini/enco  INTERVAL HISTORY:  Delayza is here today for repeat clinical assessment. No headache. More blurred vision worsening for two weeks. No improvement yet. She is moving more. No focal weakness. Family reports leaning toward to right. No falling.  She is able to walk on her own slowly.  She is able to eat more  over the last few weeks.  Marinol has been helpful.  She needs more Zofran today.  Report of rash of burning-like sensation over the face.  This has improved.  I have reviewed the past medical history, past surgical history, social history and family history with the patient and they are unchanged from previous note.  ALLERGIES:  is allergic to aspirin.  MEDICATIONS:  Current Outpatient Medications  Medication Sig Dispense Refill   binimetinib (MEKTOVI) 15 MG tablet Take 3 tablets (45 mg total) by mouth 2 (two) times daily. 180 tablet 11   docusate sodium (COLACE) 100 MG capsule Take 100-200 mg by mouth See admin instructions. 200 mg in the morning, 100 mg at bedtime     dronabinol (MARINOL) 10 MG capsule Take 1 capsule (10 mg total) by mouth 3 (three) times daily. (Patient taking differently: Take 10 mg by mouth 2 (two) times daily before a meal.) 90 capsule 0   encorafenib (BRAFTOVI) 75 MG capsule Take 6 capsules (450 mg total) by mouth daily. 180 capsule 11   famotidine (PEPCID) 20 MG tablet Take 20 mg by mouth 2 (two) times daily.     fentaNYL (DURAGESIC) 12 MCG/HR Place 1 patch onto the skin every 3 (three) days. 10 patch 0   gabapentin (NEURONTIN) 100 MG capsule Take 1 capsule (100 mg total) by mouth 2 (two) times daily. 60 capsule 0   glycopyrrolate (ROBINUL) 1 MG tablet Take 1 tablet (1 mg total) by mouth 2 (two) times daily as needed. 30 tablet 2   lidocaine-prilocaine (EMLA) cream Apply to port site 30-45 min prior to appointment. 30 g 6   OLANZapine (ZYPREXA) 2.5 MG tablet Take 1 tablet (2.5 mg total) by mouth at bedtime. 30 tablet 1   ondansetron (ZOFRAN-ODT) 8 MG disintegrating tablet Dissolve 1 tablet (8 mg total) by mouth every 8 (eight) hours as needed for nausea or vomiting. 60 tablet 2   potassium chloride (KLOR-CON M) 10 MEQ tablet Take 2 tablets (20 mEq total) by mouth 2 (two) times daily. 120 tablet 1   potassium chloride (KLOR-CON M) 10 MEQ tablet Take 2 tablets (20 mEq  total) by mouth 2 (two) times daily. 120 tablet 1   prochlorperazine (COMPAZINE) 5 MG tablet Take 1 tablet (5 mg total) by mouth every 6 (six) hours as needed for nausea or vomiting. 60 tablet 1   rosuvastatin (CRESTOR) 10 MG tablet Take 10 mg by mouth at bedtime.     scopolamine (TRANSDERM-SCOP) 1 MG/3DAYS Place 1 patch (1.5 mg total) onto the skin every 3 (three) days. 10 patch 12   traMADol (ULTRAM) 50 MG tablet Take 1-2 tablets (50-100 mg total) by mouth every 6 (six) hours as needed for moderate pain (pain score 4-6) or severe pain (pain score 7-10). 90 tablet 0   zolpidem (AMBIEN CR) 12.5 MG CR tablet Take 1 tablet (12.5 mg total) by mouth at bedtime as needed for sleep. 30 tablet 2   No current facility-administered medications for this visit.  Facility-Administered Medications Ordered in Other Visits  Medication Dose Route Frequency Provider Last Rate Last Admin   0.9 %  sodium chloride infusion   Intravenous Continuous Melven Sartorius, MD 1,000 mL/hr at 04/01/23 1055 New Bag at 04/01/23 1055   nivolumab (OPDIVO) 240 mg in sodium chloride 0.9 % 100 mL chemo infusion  240 mg Intravenous Once Melven Sartorius, MD        HISTORY OF PRESENT ILLNESS:   Oncology History  Melanoma metastatic to liver (HCC)  11/16/2022 Imaging   She presented to the emergency room on 11/16/2022 for back pain x 2 weeks and central chest pain for several months   CTA CAP IMPRESSION: 1. Negative for aortic dissection or aneurysm. 2. 1.2 cm left upper lobe nodule.  For 3. Multiple ill-defined liver lesions, suspicious for metastatic disease. 4. Multiple lytic osseous lesions, suspicious for metastatic disease. Pathologic fracture of the superior endplate of L5   11/25/2022 Pathology Results   1. Liver, needle/core biopsy, Left hepatic lobe lesion :       - METASTATIC MELANOMA.    12/04/2022 Initial Diagnosis   Melanoma of skin (HCC) Liver, bone metastases. 1.2 cm LUL nodule   12/07/2022 Cancer Staging    Staging form: Melanoma of the Skin, AJCC 8th Edition - Clinical: Stage IV (cTX, cN1, pM1d(0)) - Signed by Melven Sartorius, MD on 03/01/2023   12/09/2022 PET scan   PET: Diffuse bone and liver metastases.  Lung metastases.    12/11/2022 -  Chemotherapy   Patient is on Treatment Plan : MELANOMA Nivolumab (1) + Ipilimumab (3) q21d / Nivolumab (480) q28d     12/17/2022 Imaging   MRI brain 1. Three small right hemisphere brain metastases metastases ranging from 2 mm to 4 mm. Minimal hemosiderin. No associated cerebral edema or mass effect. No other metastatic disease identified in the brain.   2. Known osseous metastatic disease but no destructive lesion identified in the skull or visible cervical spine.   3. Moderate for age cerebral white matter changes most commonly due to small vessel disease.   12/17/2022 Imaging   MRI brain 1. Three small right hemisphere brain metastases metastases ranging from 2 mm to 4 mm. Minimal hemosiderin. No associated cerebral edema or mass effect. No other metastatic disease identified in the brain.   2. Known osseous metastatic disease but no destructive lesion identified in the skull or visible cervical spine.   3. Moderate for age cerebral white matter changes most commonly due to small vessel disease.   02/15/2023 PET scan   PET whole body  1. The degree metastatic disease defined on recent CT scan is less impressive on FDG PET scan. 2. There is intense hypermetabolic lesion in the lateral segment of the LEFT hepatic lobe which corresponds to a large 3.7 cm mass. Additional small hepatic lesions have mild metabolic activity. 3. Very low metabolic activity associated with a LEFT upper lobe pulmonary nodule which is decreased in size. Favor positive response to therapy. 4. Small LEFT hilar hypermetabolic node is indeterminate. 5. Widespread lytic lesions throughout the axillary appendicular skeleton. Majority these lesions do not have  hypermetabolic activity. One soft tissue lesion in the LEFT sacral ala does have mild metabolic activity.       REVIEW OF SYSTEMS:   All relevant systems were reviewed with the patient and are negative.   VITALS:  Blood pressure 107/78, pulse (!) 125, temperature (!) 97.3 F (36.3 C), temperature source Temporal, resp. rate 16, weight  101 lb 9.6 oz (46.1 kg), SpO2 100%.  Wt Readings from Last 3 Encounters:  04/01/23 101 lb 9.6 oz (46.1 kg)  03/19/23 107 lb 4.8 oz (48.7 kg)  03/16/23 105 lb 4.8 oz (47.8 kg)    Body mass index is 18 kg/m.  Performance status (ECOG): 2 - Symptomatic, <50% confined to bed  PHYSICAL EXAM:   GENERAL:alert, no distress  SKIN: skin color normal, minimal facial rashes  EYES: normal, sclera clear OROPHARYNX: no erythema    NECK: supple,  non-tender, without nodularity LYMPH:  no palpable cervical lymphadenopathy LUNGS: clear to auscultation with normal breathing effort.  No wheeze or rales HEART: Tachycardic ABDOMEN: abdomen soft, non-tender and nondistended Musculoskeletal: Trace bilateral lower extremity edema NEURO: alert, fluent speech, no focal motor/sensory deficits.  Strength and sensation equal bilaterally.  LABORATORY DATA:  I have reviewed the data as listed    Component Value Date/Time   NA 135 04/01/2023 0927   K 3.4 (L) 04/01/2023 0927   CL 103 04/01/2023 0927   CO2 28 04/01/2023 0927   GLUCOSE 89 04/01/2023 0927   BUN 12 04/01/2023 0927   CREATININE 1.18 (H) 04/01/2023 0927   CALCIUM 7.7 (L) 04/01/2023 0927   PROT 4.8 (L) 04/01/2023 0927   ALBUMIN 2.5 (L) 04/01/2023 0927   AST 20 04/01/2023 0927   ALT 10 04/01/2023 0927   ALKPHOS 152 (H) 04/01/2023 0927   BILITOT 0.5 04/01/2023 0927   GFRNONAA 55 (L) 04/01/2023 0927   GFRAA >60 04/20/2018 0823    No results found for: "SPEP", "UPEP"  Lab Results  Component Value Date   WBC 4.4 04/01/2023   NEUTROABS 2.7 04/01/2023   HGB 11.0 (L) 04/01/2023   HCT 32.4 (L)  04/01/2023   MCV 95.0 04/01/2023   PLT 304 04/01/2023      Chemistry      Component Value Date/Time   NA 135 04/01/2023 0927   K 3.4 (L) 04/01/2023 0927   CL 103 04/01/2023 0927   CO2 28 04/01/2023 0927   BUN 12 04/01/2023 0927   CREATININE 1.18 (H) 04/01/2023 0927      Component Value Date/Time   CALCIUM 7.7 (L) 04/01/2023 0927   ALKPHOS 152 (H) 04/01/2023 0927   AST 20 04/01/2023 0927   ALT 10 04/01/2023 0927   BILITOT 0.5 04/01/2023 4098       RADIOGRAPHIC STUDIES: I have personally reviewed the radiological images as listed and agreed with the findings in the report. MR Brain W Wo Contrast Result Date: 03/10/2023 CLINICAL DATA:  Brain metastases, assess treatment response. History of melanoma. EXAM: MRI HEAD WITHOUT AND WITH CONTRAST TECHNIQUE: Multiplanar, multiecho pulse sequences of the brain and surrounding structures were obtained without and with intravenous contrast. CONTRAST:  7.45mL GADAVIST GADOBUTROL 1 MMOL/ML IV SOLN COMPARISON:  Head CT 12/27/2022 and MRI 12/17/2022 FINDINGS: Brain: There is no evidence of an acute infarct, midline shift, or extra-axial fluid collection. Cerebral volume is normal. The ventricles are normal in size. Scattered small T2 hyperintensities in the cerebral white matter bilaterally are similar to the prior MRI and are nonspecific but compatible with mild chronic small vessel ischemic disease. The 3 small right frontal lobe metastases described on the prior MRI are stable or slightly smaller in size (series 11 images 27, 32, and 35). However, there are numerous (at least 14) new punctate intrinsically T1 hyperintense cortical lesions bilaterally, most conspicuous on the precontrast axial T1 sequence and predominantly involving the frontal lobes, and these are annotated with  double arrows on series 9. There is no significant brain edema. Vascular: Major intracranial vascular flow voids are preserved. Skull and upper cervical spine: Numerous small  skull metastases, increased in size and number from the prior MRI. Sinuses/Orbits: Unremarkable orbits. Paranasal sinuses and mastoid air cells are clear. Other: None. IMPRESSION: 1. At least 14 new punctate brain metastases.  No edema. 2. Stable or slightly decreased size of the 3 pre-existing small right frontal metastases. 3. Increased size and number of small skull metastases. Electronically Signed   By: Sebastian Ache M.D.   On: 03/10/2023 15:53   XR Lumbar Spine Complete Result Date: 03/10/2023 XRs of the lumbar spine from 03/10/2023 were independently reviewed and interpreted, showing scoliosis with apex to the right at L1.  Fracture seen of the L5 vertebral with height loss that measures 52% when compared to the L4 vertebra.  Anterior height loss noted at the L1 vertebra consistent with fracture. No other fractures seen.  No evidence of instability on flexion/extension views.  IR IMAGING GUIDED PORT INSERTION Result Date: 03/05/2023 CLINICAL DATA:  Metastatic melanoma, needs durable venous access for planned treatment regimen EXAM: TUNNELED PORT CATHETER PLACEMENT WITH ULTRASOUND AND FLUOROSCOPIC GUIDANCE FLUOROSCOPY: Radiation Exposure Index (as provided by the fluoroscopic device): Radiation Exposure Index (as provided by the fluoroscopic device): 3 mGy air Kerma ANESTHESIA/SEDATION: Intravenous Fentanyl and Versed 2mg  were administered by RN during a total moderate (conscious) sedation time of 15 minutes; the patient's level of consciousness and physiological / cardiorespiratory status were monitored continuously by radiology RN under my direct supervision. TECHNIQUE: The procedure, risks, benefits, and alternatives were explained to the patient. Questions regarding the procedure were encouraged and answered. The patient understands and consents to the procedure. Patency of the right IJ vein was confirmed with ultrasound with image documentation. An appropriate skin site was determined. Skin site  was marked. Region was prepped using maximum barrier technique including cap and mask, sterile gown, sterile gloves, large sterile sheet, and Chlorhexidine as cutaneous antisepsis. The region was infiltrated locally with 1% lidocaine. Under real-time ultrasound guidance, the right IJ vein was accessed with a 21 gauge micropuncture needle; the needle tip within the vein was confirmed with ultrasound image documentation. Needle was exchanged over a 018 guidewire for transitional dilator, and vascular measurement was performed. A small incision was made on the right anterior chest wall and a subcutaneous pocket fashioned. The power-injectable port was positioned and its catheter tunneled to the right IJ dermatotomy site. The transitional dilator was exchanged over an Amplatz wire for a peel-away sheath, through which the port catheter, which had been trimmed to the appropriate length, was advanced and positioned under fluoroscopy with its tip at the cavoatrial junction. Spot chest radiograph confirms good catheter position and no pneumothorax. The port was flushed per protocol. The pocket was closed with deep interrupted and subcuticular continuous 3-0 Monocryl sutures. The incisions were covered with Dermabond then covered with a sterile dressing. The patient tolerated the procedure well. COMPLICATIONS: COMPLICATIONS None immediate IMPRESSION: Technically successful right IJ power-injectable port catheter placement. Ready for routine use. Electronically Signed   By: Corlis Leak M.D.   On: 03/05/2023 17:05

## 2023-03-31 NOTE — Assessment & Plan Note (Signed)
 LFT improved. PET showed response. Continue monitor. encorafenib  450 mg once daily with binimetinib 45 mg twice daily. PET scan about end of March, ordered today CBC, CMP, LDH on  with follow up Continue nivolumab

## 2023-04-01 ENCOUNTER — Inpatient Hospital Stay: Payer: BC Managed Care – PPO

## 2023-04-01 ENCOUNTER — Other Ambulatory Visit (HOSPITAL_COMMUNITY): Payer: Self-pay

## 2023-04-01 VITALS — BP 107/78 | HR 125 | Temp 97.3°F | Resp 16 | Wt 101.6 lb

## 2023-04-01 DIAGNOSIS — R112 Nausea with vomiting, unspecified: Secondary | ICD-10-CM

## 2023-04-01 DIAGNOSIS — C787 Secondary malignant neoplasm of liver and intrahepatic bile duct: Secondary | ICD-10-CM

## 2023-04-01 DIAGNOSIS — E860 Dehydration: Secondary | ICD-10-CM

## 2023-04-01 DIAGNOSIS — C439 Malignant melanoma of skin, unspecified: Secondary | ICD-10-CM | POA: Diagnosis present

## 2023-04-01 DIAGNOSIS — G893 Neoplasm related pain (acute) (chronic): Secondary | ICD-10-CM | POA: Diagnosis not present

## 2023-04-01 DIAGNOSIS — Z79620 Long term (current) use of immunosuppressive biologic: Secondary | ICD-10-CM | POA: Diagnosis not present

## 2023-04-01 DIAGNOSIS — R110 Nausea: Secondary | ICD-10-CM | POA: Diagnosis not present

## 2023-04-01 DIAGNOSIS — R630 Anorexia: Secondary | ICD-10-CM

## 2023-04-01 DIAGNOSIS — Z515 Encounter for palliative care: Secondary | ICD-10-CM | POA: Diagnosis not present

## 2023-04-01 DIAGNOSIS — C7931 Secondary malignant neoplasm of brain: Secondary | ICD-10-CM | POA: Diagnosis not present

## 2023-04-01 DIAGNOSIS — R1013 Epigastric pain: Secondary | ICD-10-CM

## 2023-04-01 DIAGNOSIS — C7951 Secondary malignant neoplasm of bone: Secondary | ICD-10-CM | POA: Diagnosis not present

## 2023-04-01 DIAGNOSIS — Z5112 Encounter for antineoplastic immunotherapy: Secondary | ICD-10-CM | POA: Diagnosis not present

## 2023-04-01 DIAGNOSIS — D529 Folate deficiency anemia, unspecified: Secondary | ICD-10-CM | POA: Diagnosis not present

## 2023-04-01 LAB — CBC WITH DIFFERENTIAL (CANCER CENTER ONLY)
Abs Immature Granulocytes: 0.01 10*3/uL (ref 0.00–0.07)
Basophils Absolute: 0 10*3/uL (ref 0.0–0.1)
Basophils Relative: 1 %
Eosinophils Absolute: 0.1 10*3/uL (ref 0.0–0.5)
Eosinophils Relative: 1 %
HCT: 32.4 % — ABNORMAL LOW (ref 36.0–46.0)
Hemoglobin: 11 g/dL — ABNORMAL LOW (ref 12.0–15.0)
Immature Granulocytes: 0 %
Lymphocytes Relative: 29 %
Lymphs Abs: 1.3 10*3/uL (ref 0.7–4.0)
MCH: 32.3 pg (ref 26.0–34.0)
MCHC: 34 g/dL (ref 30.0–36.0)
MCV: 95 fL (ref 80.0–100.0)
Monocytes Absolute: 0.4 10*3/uL (ref 0.1–1.0)
Monocytes Relative: 8 %
Neutro Abs: 2.7 10*3/uL (ref 1.7–7.7)
Neutrophils Relative %: 61 %
Platelet Count: 304 10*3/uL (ref 150–400)
RBC: 3.41 MIL/uL — ABNORMAL LOW (ref 3.87–5.11)
RDW: 13.5 % (ref 11.5–15.5)
WBC Count: 4.4 10*3/uL (ref 4.0–10.5)
nRBC: 0 % (ref 0.0–0.2)

## 2023-04-01 LAB — CMP (CANCER CENTER ONLY)
ALT: 10 U/L (ref 0–44)
AST: 20 U/L (ref 15–41)
Albumin: 2.5 g/dL — ABNORMAL LOW (ref 3.5–5.0)
Alkaline Phosphatase: 152 U/L — ABNORMAL HIGH (ref 38–126)
Anion gap: 4 — ABNORMAL LOW (ref 5–15)
BUN: 12 mg/dL (ref 6–20)
CO2: 28 mmol/L (ref 22–32)
Calcium: 7.7 mg/dL — ABNORMAL LOW (ref 8.9–10.3)
Chloride: 103 mmol/L (ref 98–111)
Creatinine: 1.18 mg/dL — ABNORMAL HIGH (ref 0.44–1.00)
GFR, Estimated: 55 mL/min — ABNORMAL LOW (ref >60)
Glucose, Bld: 89 mg/dL (ref 70–99)
Potassium: 3.4 mmol/L — ABNORMAL LOW (ref 3.5–5.1)
Sodium: 135 mmol/L (ref 135–145)
Total Bilirubin: 0.5 mg/dL (ref 0.0–1.2)
Total Protein: 4.8 g/dL — ABNORMAL LOW (ref 6.5–8.1)

## 2023-04-01 LAB — LACTATE DEHYDROGENASE: LDH: 403 U/L — ABNORMAL HIGH (ref 98–192)

## 2023-04-01 MED ORDER — SODIUM CHLORIDE 0.9% FLUSH
10.0000 mL | Freq: Once | INTRAVENOUS | Status: AC | PRN
  Administered 2023-04-01: 10 mL

## 2023-04-01 MED ORDER — SODIUM CHLORIDE 0.9 % IV SOLN: INTRAVENOUS | Status: DC

## 2023-04-01 MED ORDER — ONDANSETRON 8 MG PO TBDP
8.0000 mg | ORAL_TABLET | Freq: Three times a day (TID) | ORAL | 2 refills | Status: AC | PRN
Start: 2023-04-01 — End: ?
  Filled 2023-04-01: qty 60, 20d supply, fill #0
  Filled 2023-04-18: qty 18, 6d supply, fill #1
  Filled 2023-05-07: qty 18, 21d supply, fill #2
  Filled 2023-05-31: qty 18, 21d supply, fill #3
  Filled 2023-06-27: qty 18, 21d supply, fill #4

## 2023-04-01 MED ORDER — SODIUM CHLORIDE 0.9 % IV SOLN
240.0000 mg | Freq: Once | INTRAVENOUS | Status: AC
  Administered 2023-04-01: 240 mg via INTRAVENOUS
  Filled 2023-04-01: qty 24

## 2023-04-01 NOTE — Assessment & Plan Note (Addendum)
 Starting olanzapine Continue scopolamine patch, as needed Compazine Refilled Zofran today

## 2023-04-01 NOTE — Assessment & Plan Note (Signed)
 Will use Pepcid 20 mg twice daily instead of PPI.

## 2023-04-01 NOTE — Patient Instructions (Signed)
 [facility] - A DEPT OF [facility]  Discharge Instructions: Thank you for choosing [facility] to provide your oncology and hematology care.   If you have a lab appointment with the Cancer Center, please go directly to the Cancer Center and check in at the registration area.   Wear comfortable clothing and clothing appropriate for easy access to any Portacath or PICC line.   We strive to give you quality time with your provider. You may need to reschedule your appointment if you arrive late (15 or more minutes).  Arriving late affects you and other patients whose appointments are after yours.  Also, if you miss three or more appointments without notifying the office, you may be dismissed from the clinic at the provider's discretion.      For prescription refill requests, have your pharmacy contact our office and allow 72 hours for refills to be completed.    Today you received the following chemotherapy and/or immunotherapy agents nivolumab      To help prevent nausea and vomiting after your treatment, we encourage you to take your nausea medication as directed.  BELOW ARE SYMPTOMS THAT SHOULD BE REPORTED IMMEDIATELY: *FEVER GREATER THAN 100.4 F (38 C) OR HIGHER *CHILLS OR SWEATING *NAUSEA AND VOMITING THAT IS NOT CONTROLLED WITH YOUR NAUSEA MEDICATION *UNUSUAL SHORTNESS OF BREATH *UNUSUAL BRUISING OR BLEEDING *URINARY PROBLEMS (pain or burning when urinating, or frequent urination) *BOWEL PROBLEMS (unusual diarrhea, constipation, pain near the anus) TENDERNESS IN MOUTH AND THROAT WITH OR WITHOUT PRESENCE OF ULCERS (sore throat, sores in mouth, or a toothache) UNUSUAL RASH, SWELLING OR PAIN  UNUSUAL VAGINAL DISCHARGE OR ITCHING   Items with * indicate a potential emergency and should be followed up as soon as possible or go to the Emergency Department if any problems should occur.  Please show the CHEMOTHERAPY ALERT CARD or IMMUNOTHERAPY  ALERT CARD at check-in to the Emergency Department and triage nurse.  Should you have questions after your visit or need to cancel or reschedule your appointment, please contact [facility] - A DEPT OF [facility]  Dept: [phone]  and follow the prompts.  Office hours are 8:00 a.m. to 4:30 p.m. Monday - Friday. Please note that voicemails left after 4:00 p.m. may not be returned until the following business day.  We are closed weekends and major holidays. You have access to a nurse at all times for urgent questions. Please call the main number to the clinic Dept: [phone] and follow the prompts.   For any non-urgent questions, you may also contact your provider using MyChart. We now offer e-Visits for anyone 28 and older to request care online for non-urgent symptoms. For details visit [email or web address].   Also download the MyChart app! Go to the app store, search "MyChart", open the app, select [place], and log in with your MyChart username and password.

## 2023-04-01 NOTE — Assessment & Plan Note (Signed)
 Continues Marinol to 10 mg twice daily.

## 2023-04-06 ENCOUNTER — Emergency Department (HOSPITAL_COMMUNITY)

## 2023-04-06 ENCOUNTER — Other Ambulatory Visit: Payer: Self-pay

## 2023-04-06 ENCOUNTER — Encounter (HOSPITAL_COMMUNITY): Payer: Self-pay

## 2023-04-06 ENCOUNTER — Inpatient Hospital Stay (HOSPITAL_COMMUNITY)
Admission: EM | Admit: 2023-04-06 | Discharge: 2023-04-08 | DRG: 866 | Disposition: A | Discharge status: Home / Self Care | Attending: Internal Medicine | Admitting: Internal Medicine

## 2023-04-06 DIAGNOSIS — Z803 Family history of malignant neoplasm of breast: Secondary | ICD-10-CM | POA: Diagnosis not present

## 2023-04-06 DIAGNOSIS — R509 Fever, unspecified: Secondary | ICD-10-CM | POA: Diagnosis not present

## 2023-04-06 DIAGNOSIS — R000 Tachycardia, unspecified: Secondary | ICD-10-CM | POA: Diagnosis not present

## 2023-04-06 DIAGNOSIS — Z87891 Personal history of nicotine dependence: Secondary | ICD-10-CM

## 2023-04-06 DIAGNOSIS — C7951 Secondary malignant neoplasm of bone: Secondary | ICD-10-CM | POA: Diagnosis present

## 2023-04-06 DIAGNOSIS — D72819 Decreased white blood cell count, unspecified: Secondary | ICD-10-CM | POA: Diagnosis not present

## 2023-04-06 DIAGNOSIS — K5900 Constipation, unspecified: Secondary | ICD-10-CM | POA: Diagnosis not present

## 2023-04-06 DIAGNOSIS — M8458XA Pathological fracture in neoplastic disease, other specified site, initial encounter for fracture: Secondary | ICD-10-CM | POA: Diagnosis not present

## 2023-04-06 DIAGNOSIS — C719 Malignant neoplasm of brain, unspecified: Secondary | ICD-10-CM | POA: Diagnosis not present

## 2023-04-06 DIAGNOSIS — R1013 Epigastric pain: Secondary | ICD-10-CM | POA: Diagnosis not present

## 2023-04-06 DIAGNOSIS — C7931 Secondary malignant neoplasm of brain: Secondary | ICD-10-CM | POA: Diagnosis present

## 2023-04-06 DIAGNOSIS — S32010A Wedge compression fracture of first lumbar vertebra, initial encounter for closed fracture: Secondary | ICD-10-CM | POA: Diagnosis not present

## 2023-04-06 DIAGNOSIS — S22060A Wedge compression fracture of T7-T8 vertebra, initial encounter for closed fracture: Secondary | ICD-10-CM | POA: Diagnosis not present

## 2023-04-06 DIAGNOSIS — I9589 Other hypotension: Secondary | ICD-10-CM | POA: Diagnosis present

## 2023-04-06 DIAGNOSIS — E782 Mixed hyperlipidemia: Secondary | ICD-10-CM | POA: Diagnosis not present

## 2023-04-06 DIAGNOSIS — Z981 Arthrodesis status: Secondary | ICD-10-CM | POA: Diagnosis not present

## 2023-04-06 DIAGNOSIS — E876 Hypokalemia: Secondary | ICD-10-CM | POA: Diagnosis present

## 2023-04-06 DIAGNOSIS — Y929 Unspecified place or not applicable: Secondary | ICD-10-CM

## 2023-04-06 DIAGNOSIS — M419 Scoliosis, unspecified: Secondary | ICD-10-CM | POA: Diagnosis present

## 2023-04-06 DIAGNOSIS — F411 Generalized anxiety disorder: Secondary | ICD-10-CM | POA: Diagnosis not present

## 2023-04-06 DIAGNOSIS — C439 Malignant melanoma of skin, unspecified: Secondary | ICD-10-CM | POA: Diagnosis present

## 2023-04-06 DIAGNOSIS — M545 Low back pain, unspecified: Secondary | ICD-10-CM | POA: Diagnosis not present

## 2023-04-06 DIAGNOSIS — Z9049 Acquired absence of other specified parts of digestive tract: Secondary | ICD-10-CM

## 2023-04-06 DIAGNOSIS — Z801 Family history of malignant neoplasm of trachea, bronchus and lung: Secondary | ICD-10-CM

## 2023-04-06 DIAGNOSIS — Z79899 Other long term (current) drug therapy: Secondary | ICD-10-CM

## 2023-04-06 DIAGNOSIS — B349 Viral infection, unspecified: Secondary | ICD-10-CM | POA: Diagnosis not present

## 2023-04-06 DIAGNOSIS — M4854XA Collapsed vertebra, not elsewhere classified, thoracic region, initial encounter for fracture: Secondary | ICD-10-CM | POA: Diagnosis not present

## 2023-04-06 DIAGNOSIS — R0989 Other specified symptoms and signs involving the circulatory and respiratory systems: Secondary | ICD-10-CM | POA: Diagnosis not present

## 2023-04-06 DIAGNOSIS — Z90710 Acquired absence of both cervix and uterus: Secondary | ICD-10-CM | POA: Diagnosis not present

## 2023-04-06 DIAGNOSIS — K5909 Other constipation: Secondary | ICD-10-CM

## 2023-04-06 DIAGNOSIS — F40240 Claustrophobia: Secondary | ICD-10-CM | POA: Diagnosis present

## 2023-04-06 DIAGNOSIS — R6510 Systemic inflammatory response syndrome (SIRS) of non-infectious origin without acute organ dysfunction: Secondary | ICD-10-CM | POA: Diagnosis not present

## 2023-04-06 DIAGNOSIS — E785 Hyperlipidemia, unspecified: Secondary | ICD-10-CM | POA: Diagnosis present

## 2023-04-06 DIAGNOSIS — M797 Fibromyalgia: Secondary | ICD-10-CM | POA: Diagnosis present

## 2023-04-06 DIAGNOSIS — S22070A Wedge compression fracture of T9-T10 vertebra, initial encounter for closed fracture: Secondary | ICD-10-CM | POA: Diagnosis not present

## 2023-04-06 DIAGNOSIS — M8448XA Pathological fracture, other site, initial encounter for fracture: Secondary | ICD-10-CM | POA: Diagnosis not present

## 2023-04-06 DIAGNOSIS — Z85820 Personal history of malignant melanoma of skin: Secondary | ICD-10-CM | POA: Diagnosis not present

## 2023-04-06 DIAGNOSIS — E7800 Pure hypercholesterolemia, unspecified: Secondary | ICD-10-CM | POA: Diagnosis not present

## 2023-04-06 DIAGNOSIS — Z886 Allergy status to analgesic agent status: Secondary | ICD-10-CM

## 2023-04-06 DIAGNOSIS — C787 Secondary malignant neoplasm of liver and intrahepatic bile duct: Secondary | ICD-10-CM | POA: Diagnosis present

## 2023-04-06 DIAGNOSIS — M533 Sacrococcygeal disorders, not elsewhere classified: Secondary | ICD-10-CM | POA: Diagnosis not present

## 2023-04-06 DIAGNOSIS — R112 Nausea with vomiting, unspecified: Secondary | ICD-10-CM | POA: Diagnosis not present

## 2023-04-06 DIAGNOSIS — R590 Localized enlarged lymph nodes: Secondary | ICD-10-CM | POA: Diagnosis not present

## 2023-04-06 DIAGNOSIS — T50995A Adverse effect of other drugs, medicaments and biological substances, initial encounter: Secondary | ICD-10-CM | POA: Diagnosis present

## 2023-04-06 DIAGNOSIS — G43909 Migraine, unspecified, not intractable, without status migrainosus: Secondary | ICD-10-CM | POA: Diagnosis present

## 2023-04-06 DIAGNOSIS — C801 Malignant (primary) neoplasm, unspecified: Secondary | ICD-10-CM | POA: Diagnosis not present

## 2023-04-06 HISTORY — DX: Malignant melanoma of skin, unspecified: C43.9

## 2023-04-06 LAB — CBC WITH DIFFERENTIAL/PLATELET
Abs Immature Granulocytes: 0.01 10*3/uL (ref 0.00–0.07)
Basophils Absolute: 0 10*3/uL (ref 0.0–0.1)
Basophils Relative: 1 %
Eosinophils Absolute: 0.1 10*3/uL (ref 0.0–0.5)
Eosinophils Relative: 3 %
HCT: 36.5 % (ref 36.0–46.0)
Hemoglobin: 12.2 g/dL (ref 12.0–15.0)
Immature Granulocytes: 0 %
Lymphocytes Relative: 12 %
Lymphs Abs: 0.5 10*3/uL — ABNORMAL LOW (ref 0.7–4.0)
MCH: 31.9 pg (ref 26.0–34.0)
MCHC: 33.4 g/dL (ref 30.0–36.0)
MCV: 95.5 fL (ref 80.0–100.0)
Monocytes Absolute: 0.2 10*3/uL (ref 0.1–1.0)
Monocytes Relative: 5 %
Neutro Abs: 2.9 10*3/uL (ref 1.7–7.7)
Neutrophils Relative %: 79 %
Platelets: 287 10*3/uL (ref 150–400)
RBC: 3.82 MIL/uL — ABNORMAL LOW (ref 3.87–5.11)
RDW: 13.2 % (ref 11.5–15.5)
WBC: 3.7 10*3/uL — ABNORMAL LOW (ref 4.0–10.5)
nRBC: 0 % (ref 0.0–0.2)

## 2023-04-06 LAB — HEPATIC FUNCTION PANEL
ALT: 14 U/L (ref 0–44)
AST: 46 U/L — ABNORMAL HIGH (ref 15–41)
Albumin: 2 g/dL — ABNORMAL LOW (ref 3.5–5.0)
Alkaline Phosphatase: 113 U/L (ref 38–126)
Bilirubin, Direct: 0.2 mg/dL (ref 0.0–0.2)
Indirect Bilirubin: 0.4 mg/dL (ref 0.3–0.9)
Total Bilirubin: 0.6 mg/dL (ref 0.0–1.2)
Total Protein: 4.3 g/dL — ABNORMAL LOW (ref 6.5–8.1)

## 2023-04-06 LAB — LACTIC ACID, PLASMA
Lactic Acid, Venous: 1.3 mmol/L (ref 0.5–1.9)
Lactic Acid, Venous: 1.3 mmol/L (ref 0.5–1.9)

## 2023-04-06 LAB — TROPONIN I (HIGH SENSITIVITY)
Troponin I (High Sensitivity): 6 ng/L (ref <18)
Troponin I (High Sensitivity): 7 ng/L (ref <18)

## 2023-04-06 LAB — BASIC METABOLIC PANEL
Anion gap: 10 (ref 5–15)
BUN: 8 mg/dL (ref 6–20)
CO2: 21 mmol/L — ABNORMAL LOW (ref 22–32)
Calcium: 7.9 mg/dL — ABNORMAL LOW (ref 8.9–10.3)
Chloride: 103 mmol/L (ref 98–111)
Creatinine, Ser: 1.15 mg/dL — ABNORMAL HIGH (ref 0.44–1.00)
GFR, Estimated: 56 mL/min — ABNORMAL LOW (ref >60)
Glucose, Bld: 103 mg/dL — ABNORMAL HIGH (ref 70–99)
Potassium: 3.7 mmol/L (ref 3.5–5.1)
Sodium: 134 mmol/L — ABNORMAL LOW (ref 135–145)

## 2023-04-06 LAB — URINALYSIS, W/ REFLEX TO CULTURE (INFECTION SUSPECTED)
Bacteria, UA: NONE SEEN
Bilirubin Urine: NEGATIVE
Glucose, UA: NEGATIVE mg/dL
Hgb urine dipstick: NEGATIVE
Ketones, ur: NEGATIVE mg/dL
Leukocytes,Ua: NEGATIVE
Nitrite: NEGATIVE
Protein, ur: NEGATIVE mg/dL
Specific Gravity, Urine: 1.015 (ref 1.005–1.030)
pH: 8 (ref 5.0–8.0)

## 2023-04-06 LAB — RESP PANEL BY RT-PCR (RSV, FLU A&B, COVID)  RVPGX2
Influenza A by PCR: NEGATIVE
Influenza B by PCR: NEGATIVE
Resp Syncytial Virus by PCR: NEGATIVE
SARS Coronavirus 2 by RT PCR: NEGATIVE

## 2023-04-06 MED ORDER — CEFEPIME HCL 2 G IV SOLR
INTRAVENOUS | Status: AC
  Administered 2023-04-06: 2 g via INTRAVENOUS
  Filled 2023-04-06: qty 12.5

## 2023-04-06 MED ORDER — METRONIDAZOLE 500 MG/100ML IV SOLN
500.0000 mg | Freq: Once | INTRAVENOUS | Status: AC
  Administered 2023-04-06: 500 mg via INTRAVENOUS
  Filled 2023-04-06: qty 100

## 2023-04-06 MED ORDER — LACTATED RINGERS IV SOLN: INTRAVENOUS | Status: AC

## 2023-04-06 MED ORDER — IOHEXOL 350 MG/ML SOLN
75.0000 mL | Freq: Once | INTRAVENOUS | Status: AC | PRN
  Administered 2023-04-06: 75 mL via INTRAVENOUS

## 2023-04-06 MED ORDER — LACTATED RINGERS IV BOLUS (SEPSIS)
1000.0000 mL | Freq: Once | INTRAVENOUS | Status: AC
  Administered 2023-04-06: 1000 mL via INTRAVENOUS

## 2023-04-06 MED ORDER — ONDANSETRON HCL 4 MG/2ML IJ SOLN
4.0000 mg | Freq: Once | INTRAMUSCULAR | Status: AC
  Administered 2023-04-06: 4 mg via INTRAVENOUS
  Filled 2023-04-06: qty 2

## 2023-04-06 MED ORDER — LACTATED RINGERS IV BOLUS (SEPSIS)
500.0000 mL | Freq: Once | INTRAVENOUS | Status: AC
  Administered 2023-04-06: 500 mL via INTRAVENOUS

## 2023-04-06 MED ORDER — VANCOMYCIN HCL IN DEXTROSE 1-5 GM/200ML-% IV SOLN
1000.0000 mg | Freq: Once | INTRAVENOUS | Status: AC
  Administered 2023-04-06: 1000 mg via INTRAVENOUS
  Filled 2023-04-06: qty 200

## 2023-04-06 MED ORDER — SODIUM CHLORIDE 0.9 % IV SOLN
2.0000 g | Freq: Once | INTRAVENOUS | Status: AC
  Filled 2023-04-06: qty 12.5

## 2023-04-06 NOTE — ED Notes (Signed)
 Pt CT

## 2023-04-06 NOTE — Sepsis Progress Note (Signed)
 Elink monitoring for the code sepsis protocol.

## 2023-04-06 NOTE — ED Provider Notes (Signed)
 [place] EMERGENCY DEPARTMENT AT [facility] Provider Note   CSN: [id number] Arrival date & time: 04/06/23  2022     History {Add pertinent medical, surgical, social history, OB history to HPI:1} Chief Complaint  Patient presents with   Fever    Laura [name] is a 58 y.o. female.   Fever    Patient has a history of anxiety fibromyalgia migraines acid reflux hypercholesterolemia melanoma that has metastasized to the liver.  Patient is currently undergoing immunotherapy for her melanoma.  Patient's last infusion was on March 6.  Patient states she started developing a fever tonight at home.  She has had some congestion.  Patient also has noticed some swelling in her lower legs.  She also experienced pain and discomfort in her sacrum.  Patient has not noticed any redness or sores in her sacral region.  She has not had any dysuria.  She did have 1 episode of vomiting.  No diarrhea.  No chest pain no shortness of breath.  Home Medications Prior to Admission medications   Medication Sig Start Date End Date Taking? Authorizing Provider  binimetinib (MEKTOVI) 15 MG tablet Take 3 tablets (45 mg total) by mouth 2 (two) times daily. 03/16/23   Melven Sartorius, MD  docusate sodium (COLACE) 100 MG capsule Take 100-200 mg by mouth See admin instructions. 200 mg in the morning, 100 mg at bedtime    [provider]  dronabinol (MARINOL) 10 MG capsule Take 1 capsule (10 mg total) by mouth 3 (three) times daily. Patient taking differently: Take 10 mg by mouth 2 (two) times daily before a meal. 02/12/23   Pickenpack-Cousar, Arty Baumgartner, NP  encorafenib (BRAFTOVI) 75 MG capsule Take 6 capsules (450 mg total) by mouth daily. 03/16/23   Melven Sartorius, MD  famotidine (PEPCID) 20 MG tablet Take 20 mg by mouth 2 (two) times daily.    [provider]  fentaNYL (DURAGESIC) 12 MCG/HR Place 1 patch onto the skin every 3 (three) days. 03/11/23   Pickenpack-Cousar, Arty Baumgartner, NP   gabapentin (NEURONTIN) 100 MG capsule Take 1 capsule (100 mg total) by mouth 2 (two) times daily. 03/11/23   Melven Sartorius, MD  glycopyrrolate (ROBINUL) 1 MG tablet Take 1 tablet (1 mg total) by mouth 2 (two) times daily as needed. 03/16/23   Pickenpack-Cousar, Arty Baumgartner, NP  lidocaine-prilocaine (EMLA) cream Apply to port site 30-45 min prior to appointment. 03/16/23   Melven Sartorius, MD  OLANZapine (ZYPREXA) 2.5 MG tablet Take 1 tablet (2.5 mg total) by mouth at bedtime. 03/30/23   Melven Sartorius, MD  ondansetron (ZOFRAN-ODT) 8 MG disintegrating tablet Dissolve 1 tablet (8 mg total) by mouth every 8 (eight) hours as needed for nausea or vomiting. 04/01/23   Melven Sartorius, MD  potassium chloride (KLOR-CON M) 10 MEQ tablet Take 2 tablets (20 mEq total) by mouth 2 (two) times daily. 03/16/23   Melven Sartorius, MD  potassium chloride (KLOR-CON M) 10 MEQ tablet Take 2 tablets (20 mEq total) by mouth 2 (two) times daily. 03/16/23   Melven Sartorius, MD  prochlorperazine (COMPAZINE) 5 MG tablet Take 1 tablet (5 mg total) by mouth every 6 (six) hours as needed for nausea or vomiting. 03/01/23   Melven Sartorius, MD  rosuvastatin (CRESTOR) 10 MG tablet Take 10 mg by mouth at bedtime.    [provider]  scopolamine (TRANSDERM-SCOP) 1 MG/3DAYS Place 1 patch (1.5 mg total) onto the skin every 3 (  three) days. 02/15/23   Pickenpack-Cousar, Arty Baumgartner, NP  traMADol (ULTRAM) 50 MG tablet Take 1-2 tablets (50-100 mg total) by mouth every 6 (six) hours as needed for moderate pain (pain score 4-6) or severe pain (pain score 7-10). 03/11/23   Pickenpack-Cousar, Arty Baumgartner, NP  zolpidem (AMBIEN CR) 12.5 MG CR tablet Take 1 tablet (12.5 mg total) by mouth at bedtime as needed for sleep. 03/19/23 03/18/24  Pickenpack-Cousar, Arty Baumgartner, NP      Allergies    Aspirin and Tylenol [acetaminophen]    Review of Systems   Review of Systems  Constitutional:  Positive for fever.    Physical Exam Updated Vital Signs BP (!)  125/106 (BP Location: Right Arm)   Pulse (!) 149   Temp 99.3 F (37.4 C)   Resp 20   Ht 1.575 m (5\' 2" )   Wt 45.8 kg   SpO2 96%   BMI 18.47 kg/m  Physical Exam Vitals and nursing note reviewed.  Constitutional:      Appearance: She is well-developed. She is not diaphoretic.  HENT:     Head: Normocephalic and atraumatic.     Right Ear: External ear normal.     Left Ear: External ear normal.  Eyes:     General: No scleral icterus.       Right eye: No discharge.        Left eye: No discharge.     Conjunctiva/sclera: Conjunctivae normal.  Neck:     Trachea: No tracheal deviation.  Cardiovascular:     Rate and Rhythm: Regular rhythm. Tachycardia present.  Pulmonary:     Effort: Pulmonary effort is normal. No respiratory distress.     Breath sounds: Normal breath sounds. No stridor. No wheezing or rales.  Abdominal:     General: Bowel sounds are normal. There is no distension.     Palpations: Abdomen is soft.     Tenderness: There is no abdominal tenderness. There is no guarding or rebound.  Musculoskeletal:        General: No tenderness or deformity.     Cervical back: Neck supple.     Right lower leg: Edema present.     Left lower leg: Edema present.     Comments: Mild pitting edema noted bilateral lower extremities, no erythema  Skin:    General: Skin is warm and dry.     Findings: No rash.  Neurological:     General: No focal deficit present.     Mental Status: She is alert.     Cranial Nerves: No cranial nerve deficit, dysarthria or facial asymmetry.     Sensory: No sensory deficit.     Motor: No abnormal muscle tone or seizure activity.     Coordination: Coordination normal.  Psychiatric:        Mood and Affect: Mood normal.     ED Results / Procedures / Treatments   Labs (all labs ordered are listed, but only abnormal results are displayed) Labs Reviewed  BASIC METABOLIC PANEL - Abnormal; Notable for the following components:      Result Value   Sodium  134 (*)    CO2 21 (*)    Glucose, Bld 103 (*)    Creatinine, Ser 1.15 (*)    Calcium 7.9 (*)    GFR, Estimated 56 (*)    All other components within normal limits  CBC WITH DIFFERENTIAL/PLATELET - Abnormal; Notable for the following components:   WBC 3.7 (*)    RBC 3.82 (*)  Lymphs Abs 0.5 (*)    All other components within normal limits  RESP PANEL BY RT-PCR (RSV, FLU A&B, COVID)  [id number]  LACTIC ACID, PLASMA  LACTIC ACID, PLASMA  URINALYSIS, W/ REFLEX TO CULTURE (INFECTION SUSPECTED)  TROPONIN I (HIGH SENSITIVITY)    EKG EKG Interpretation Date/Time:  Tuesday April 06 2023 20:30:58 EDT Ventricular Rate:  154 PR Interval:  92 QRS Duration:  87 QT Interval:  282 QTC Calculation: 452 R Axis:   -22  Text Interpretation: Sinus tachycardia Borderline left axis deviation Low voltage, precordial leads RSR' in V1 or V2, probably normal variant Abnormal T, consider ischemia, diffuse leads , increased since last tracing Confirmed by Linwood Dibbles [phone]) on 04/06/2023 8:35:15 PM  Radiology No results found.  Procedures Procedures  {Document cardiac monitor, telemetry assessment procedure when appropriate:1}  Medications Ordered in ED Medications  lactated ringers infusion (has no administration in time range)  lactated ringers bolus 1,000 mL (has no administration in time range)    And  lactated ringers bolus 500 mL (has no administration in time range)  ceFEPIme (MAXIPIME) 2 g in sodium chloride 0.9 % 100 mL IVPB (has no administration in time range)  metroNIDAZOLE (FLAGYL) IVPB 500 mg (has no administration in time range)  vancomycin (VANCOCIN) IVPB 1000 mg/200 mL premix (has no administration in time range)    ED Course/ Medical Decision Making/ A&P   {   Click here for ABCD2, HEART and other calculatorsREFRESH Note before signing :1}                              Medical Decision Making Amount and/or Complexity of Data Reviewed Labs: ordered. Radiology:  ordered.  Risk Prescription drug management.   ***  {Document critical care time when appropriate:1} {Document review of labs and clinical decision tools ie heart score, Chads2Vasc2 etc:1}  {Document your independent review of radiology images, and any outside records:1} {Document your discussion with family members, caretakers, and with consultants:1} {Document social determinants of health affecting pt's care:1} {Document your decision making why or why not admission, treatments were needed:1} Final Clinical Impression(s) / ED Diagnoses Final diagnoses:  None    Rx / DC Orders ED Discharge Orders     None

## 2023-04-06 NOTE — ED Provider Triage Note (Signed)
 Emergency Medicine Provider Triage Evaluation Note  Laura [name] , a 58 y.o. female  was evaluated in triage.  Pt complains of fever and nausea for two days. History of stage 4 metastatic melanoma. On immunotherapy, last treatment 04/01/2023.  Review of Systems  Positive: Fever, nausea, fever of 102 at home Negative: Chest pain, shortness of breath  Physical Exam  BP (!) 125/106 (BP Location: Right Arm)   Pulse (!) 149   Temp 99.3 F (37.4 C)   Resp 20   Ht 5\' 2"  (1.575 m)   Wt 45.8 kg   SpO2 96%   BMI 18.47 kg/m  Gen:   Awake, no distress   Resp:  Normal effort  MSK:   Moves extremities without difficulty  Other:    Medical Decision Making  Medically screening exam initiated at 8:45 PM.  Appropriate orders placed.  Laura [name] was informed that the remainder of the evaluation will be completed by another provider, this initial triage assessment does not replace that evaluation, and the importance of remaining in the ED until their evaluation is complete.     Felicie Morn, NP 04/06/23 2057

## 2023-04-06 NOTE — ED Triage Notes (Signed)
 Pt reports fever x2 days with congestion. Pt denies SOB. Reports body aches and a "fire-poker" pain in tail bone. HR 150 in triage. Pt denies CP, dizziness, lightheadedness, weakness. Pt currently receiving treatment for Stage 4 melanoma.

## 2023-04-07 ENCOUNTER — Encounter (HOSPITAL_COMMUNITY): Payer: Self-pay | Admitting: Internal Medicine

## 2023-04-07 ENCOUNTER — Inpatient Hospital Stay: Payer: BC Managed Care – PPO

## 2023-04-07 ENCOUNTER — Telehealth: Payer: Self-pay

## 2023-04-07 ENCOUNTER — Inpatient Hospital Stay (HOSPITAL_COMMUNITY)

## 2023-04-07 DIAGNOSIS — Z801 Family history of malignant neoplasm of trachea, bronchus and lung: Secondary | ICD-10-CM | POA: Diagnosis not present

## 2023-04-07 DIAGNOSIS — C787 Secondary malignant neoplasm of liver and intrahepatic bile duct: Secondary | ICD-10-CM | POA: Diagnosis present

## 2023-04-07 DIAGNOSIS — Z90710 Acquired absence of both cervix and uterus: Secondary | ICD-10-CM | POA: Diagnosis not present

## 2023-04-07 DIAGNOSIS — R1013 Epigastric pain: Secondary | ICD-10-CM | POA: Diagnosis not present

## 2023-04-07 DIAGNOSIS — R112 Nausea with vomiting, unspecified: Secondary | ICD-10-CM | POA: Diagnosis present

## 2023-04-07 DIAGNOSIS — R6510 Systemic inflammatory response syndrome (SIRS) of non-infectious origin without acute organ dysfunction: Secondary | ICD-10-CM

## 2023-04-07 DIAGNOSIS — C439 Malignant melanoma of skin, unspecified: Secondary | ICD-10-CM

## 2023-04-07 DIAGNOSIS — Z981 Arthrodesis status: Secondary | ICD-10-CM | POA: Diagnosis not present

## 2023-04-07 DIAGNOSIS — Z87891 Personal history of nicotine dependence: Secondary | ICD-10-CM | POA: Diagnosis not present

## 2023-04-07 DIAGNOSIS — F411 Generalized anxiety disorder: Secondary | ICD-10-CM | POA: Diagnosis present

## 2023-04-07 DIAGNOSIS — S32010A Wedge compression fracture of first lumbar vertebra, initial encounter for closed fracture: Secondary | ICD-10-CM

## 2023-04-07 DIAGNOSIS — C7951 Secondary malignant neoplasm of bone: Secondary | ICD-10-CM | POA: Diagnosis present

## 2023-04-07 DIAGNOSIS — E782 Mixed hyperlipidemia: Secondary | ICD-10-CM

## 2023-04-07 DIAGNOSIS — M8458XA Pathological fracture in neoplastic disease, other specified site, initial encounter for fracture: Secondary | ICD-10-CM

## 2023-04-07 DIAGNOSIS — E7800 Pure hypercholesterolemia, unspecified: Secondary | ICD-10-CM | POA: Diagnosis present

## 2023-04-07 DIAGNOSIS — R509 Fever, unspecified: Secondary | ICD-10-CM | POA: Insufficient documentation

## 2023-04-07 DIAGNOSIS — T50995A Adverse effect of other drugs, medicaments and biological substances, initial encounter: Secondary | ICD-10-CM | POA: Diagnosis present

## 2023-04-07 DIAGNOSIS — D72819 Decreased white blood cell count, unspecified: Secondary | ICD-10-CM | POA: Diagnosis present

## 2023-04-07 DIAGNOSIS — S22060A Wedge compression fracture of T7-T8 vertebra, initial encounter for closed fracture: Secondary | ICD-10-CM | POA: Diagnosis not present

## 2023-04-07 DIAGNOSIS — E785 Hyperlipidemia, unspecified: Secondary | ICD-10-CM | POA: Diagnosis present

## 2023-04-07 DIAGNOSIS — M545 Low back pain, unspecified: Secondary | ICD-10-CM | POA: Diagnosis not present

## 2023-04-07 DIAGNOSIS — B349 Viral infection, unspecified: Secondary | ICD-10-CM | POA: Diagnosis present

## 2023-04-07 DIAGNOSIS — Z886 Allergy status to analgesic agent status: Secondary | ICD-10-CM | POA: Diagnosis not present

## 2023-04-07 DIAGNOSIS — M419 Scoliosis, unspecified: Secondary | ICD-10-CM | POA: Diagnosis present

## 2023-04-07 DIAGNOSIS — I9589 Other hypotension: Secondary | ICD-10-CM | POA: Diagnosis present

## 2023-04-07 DIAGNOSIS — C7931 Secondary malignant neoplasm of brain: Secondary | ICD-10-CM | POA: Diagnosis present

## 2023-04-07 DIAGNOSIS — E876 Hypokalemia: Secondary | ICD-10-CM | POA: Diagnosis present

## 2023-04-07 DIAGNOSIS — Z9049 Acquired absence of other specified parts of digestive tract: Secondary | ICD-10-CM | POA: Diagnosis not present

## 2023-04-07 DIAGNOSIS — Z803 Family history of malignant neoplasm of breast: Secondary | ICD-10-CM | POA: Diagnosis not present

## 2023-04-07 DIAGNOSIS — K5909 Other constipation: Secondary | ICD-10-CM | POA: Diagnosis not present

## 2023-04-07 DIAGNOSIS — M797 Fibromyalgia: Secondary | ICD-10-CM | POA: Diagnosis present

## 2023-04-07 DIAGNOSIS — Z79899 Other long term (current) drug therapy: Secondary | ICD-10-CM | POA: Diagnosis not present

## 2023-04-07 DIAGNOSIS — Y929 Unspecified place or not applicable: Secondary | ICD-10-CM | POA: Diagnosis not present

## 2023-04-07 DIAGNOSIS — Z85820 Personal history of malignant melanoma of skin: Secondary | ICD-10-CM | POA: Diagnosis not present

## 2023-04-07 LAB — COMPREHENSIVE METABOLIC PANEL
ALT: 13 U/L (ref 0–44)
AST: 33 U/L (ref 15–41)
Albumin: 2 g/dL — ABNORMAL LOW (ref 3.5–5.0)
Alkaline Phosphatase: 112 U/L (ref 38–126)
Anion gap: 5 (ref 5–15)
BUN: 8 mg/dL (ref 6–20)
CO2: 23 mmol/L (ref 22–32)
Calcium: 7.2 mg/dL — ABNORMAL LOW (ref 8.9–10.3)
Chloride: 103 mmol/L (ref 98–111)
Creatinine, Ser: 1 mg/dL (ref 0.44–1.00)
GFR, Estimated: >60 mL/min (ref >60)
Glucose, Bld: 95 mg/dL (ref 70–99)
Potassium: 3.6 mmol/L (ref 3.5–5.1)
Sodium: 131 mmol/L — ABNORMAL LOW (ref 135–145)
Total Bilirubin: 0.5 mg/dL (ref 0.0–1.2)
Total Protein: 4.2 g/dL — ABNORMAL LOW (ref 6.5–8.1)

## 2023-04-07 LAB — CBC WITH DIFFERENTIAL/PLATELET
Abs Immature Granulocytes: 0.02 10*3/uL (ref 0.00–0.07)
Basophils Absolute: 0 10*3/uL (ref 0.0–0.1)
Basophils Relative: 1 %
Eosinophils Absolute: 0.1 10*3/uL (ref 0.0–0.5)
Eosinophils Relative: 3 %
HCT: 29.2 % — ABNORMAL LOW (ref 36.0–46.0)
Hemoglobin: 9.7 g/dL — ABNORMAL LOW (ref 12.0–15.0)
Immature Granulocytes: 1 %
Lymphocytes Relative: 16 %
Lymphs Abs: 0.4 10*3/uL — ABNORMAL LOW (ref 0.7–4.0)
MCH: 32.2 pg (ref 26.0–34.0)
MCHC: 33.2 g/dL (ref 30.0–36.0)
MCV: 97 fL (ref 80.0–100.0)
Monocytes Absolute: 0.2 10*3/uL (ref 0.1–1.0)
Monocytes Relative: 6 %
Neutro Abs: 1.9 10*3/uL (ref 1.7–7.7)
Neutrophils Relative %: 73 %
Platelets: 220 10*3/uL (ref 150–400)
RBC: 3.01 MIL/uL — ABNORMAL LOW (ref 3.87–5.11)
RDW: 13.2 % (ref 11.5–15.5)
WBC: 2.6 10*3/uL — ABNORMAL LOW (ref 4.0–10.5)
nRBC: 0 % (ref 0.0–0.2)

## 2023-04-07 LAB — MAGNESIUM
Magnesium: 1.8 mg/dL (ref 1.7–2.4)
Magnesium: 1.9 mg/dL (ref 1.7–2.4)

## 2023-04-07 LAB — SEDIMENTATION RATE: Sed Rate: 9 mm/h (ref 0–22)

## 2023-04-07 LAB — C-REACTIVE PROTEIN: CRP: 7.2 mg/dL — ABNORMAL HIGH (ref <1.0)

## 2023-04-07 MED ORDER — PROMETHAZINE HCL 25 MG RE SUPP: 25.0000 mg | Freq: Four times a day (QID) | RECTAL | Status: DC | PRN

## 2023-04-07 MED ORDER — PROMETHAZINE HCL 25 MG PO TABS
25.0000 mg | ORAL_TABLET | Freq: Four times a day (QID) | ORAL | Status: DC | PRN
Start: 2023-04-07 — End: 2023-04-08
  Administered 2023-04-08: 25 mg via ORAL
  Filled 2023-04-07: qty 1

## 2023-04-07 MED ORDER — BINIMETINIB 15 MG PO TABS
45.0000 mg | ORAL_TABLET | Freq: Two times a day (BID) | ORAL | Status: DC
Start: 2023-04-07 — End: 2023-04-07

## 2023-04-07 MED ORDER — ROSUVASTATIN CALCIUM 10 MG PO TABS
10.0000 mg | ORAL_TABLET | Freq: Every day | ORAL | Status: DC
  Administered 2023-04-07: 10 mg via ORAL
  Filled 2023-04-07: qty 1

## 2023-04-07 MED ORDER — DRONABINOL 5 MG PO CAPS
10.0000 mg | ORAL_CAPSULE | Freq: Two times a day (BID) | ORAL | Status: DC
Start: 2023-04-07 — End: 2023-04-08
  Administered 2023-04-07 – 2023-04-08 (×3): 10 mg via ORAL
  Filled 2023-04-07 (×3): qty 2

## 2023-04-07 MED ORDER — GADOBUTROL 1 MMOL/ML IV SOLN
4.0000 mL | Freq: Once | INTRAVENOUS | Status: AC | PRN
  Administered 2023-04-07: 4 mL via INTRAVENOUS

## 2023-04-07 MED ORDER — LORAZEPAM 2 MG/ML IJ SOLN
0.5000 mg | Freq: Once | INTRAMUSCULAR | Status: DC
  Filled 2023-04-07: qty 1

## 2023-04-07 MED ORDER — GABAPENTIN 100 MG PO CAPS
100.0000 mg | ORAL_CAPSULE | Freq: Every evening | ORAL | Status: DC
  Administered 2023-04-08: 100 mg via ORAL
  Filled 2023-04-07: qty 1

## 2023-04-07 MED ORDER — FAMOTIDINE 20 MG PO TABS
20.0000 mg | ORAL_TABLET | Freq: Two times a day (BID) | ORAL | Status: DC
  Administered 2023-04-07: 20 mg via ORAL
  Filled 2023-04-07: qty 1

## 2023-04-07 MED ORDER — ONDANSETRON HCL 4 MG/2ML IJ SOLN
4.0000 mg | Freq: Once | INTRAMUSCULAR | Status: AC
  Administered 2023-04-07: 4 mg via INTRAVENOUS
  Filled 2023-04-07: qty 2

## 2023-04-07 MED ORDER — NALOXONE HCL 0.4 MG/ML IJ SOLN: 0.4000 mg | INTRAMUSCULAR | Status: DC | PRN

## 2023-04-07 MED ORDER — BINIMETINIB 15 MG PO TABS
45.0000 mg | ORAL_TABLET | Freq: Two times a day (BID) | ORAL | Status: DC
  Administered 2023-04-07 – 2023-04-08 (×2): 45 mg via ORAL
  Filled 2023-04-07 (×2): qty 3

## 2023-04-07 MED ORDER — SODIUM CHLORIDE 0.9 % IV SOLN
INTRAVENOUS | Status: DC
Start: 2023-04-07 — End: 2023-04-08

## 2023-04-07 MED ORDER — SIMETHICONE 80 MG PO CHEW: 80.0000 mg | CHEWABLE_TABLET | Freq: Four times a day (QID) | ORAL | Status: DC | PRN

## 2023-04-07 MED ORDER — HYDROMORPHONE HCL 1 MG/ML IJ SOLN
0.5000 mg | INTRAMUSCULAR | Status: DC | PRN
  Administered 2023-04-07 – 2023-04-08 (×3): 1 mg via INTRAVENOUS
  Filled 2023-04-07 (×3): qty 1

## 2023-04-07 MED ORDER — IBUPROFEN 200 MG PO TABS: 600.0000 mg | ORAL_TABLET | Freq: Four times a day (QID) | ORAL | Status: DC | PRN

## 2023-04-07 MED ORDER — LORAZEPAM 2 MG/ML IJ SOLN
1.0000 mg | Freq: Once | INTRAMUSCULAR | Status: AC
  Administered 2023-04-07: 1 mg via INTRAVENOUS
  Filled 2023-04-07: qty 1

## 2023-04-07 MED ORDER — VANCOMYCIN HCL IN DEXTROSE 1-5 GM/200ML-% IV SOLN
1000.0000 mg | INTRAVENOUS | Status: DC
  Administered 2023-04-08: 1000 mg via INTRAVENOUS
  Filled 2023-04-07: qty 200

## 2023-04-07 MED ORDER — FAMOTIDINE IN NACL 20-0.9 MG/50ML-% IV SOLN
20.0000 mg | Freq: Two times a day (BID) | INTRAVENOUS | Status: DC
  Administered 2023-04-07 – 2023-04-08 (×2): 20 mg via INTRAVENOUS
  Filled 2023-04-07 (×2): qty 50

## 2023-04-07 MED ORDER — SCOPOLAMINE 1 MG/3DAYS TD PT72
1.0000 | MEDICATED_PATCH | TRANSDERMAL | Status: DC
  Administered 2023-04-08: 1.5 mg via TRANSDERMAL
  Filled 2023-04-07: qty 1

## 2023-04-07 MED ORDER — SODIUM CHLORIDE 0.9 % IV SOLN
2.0000 g | Freq: Two times a day (BID) | INTRAVENOUS | Status: DC
  Administered 2023-04-07 – 2023-04-08 (×3): 2 g via INTRAVENOUS
  Filled 2023-04-07 (×3): qty 12.5

## 2023-04-07 MED ORDER — ENCORAFENIB 75 MG PO CAPS
450.0000 mg | ORAL_CAPSULE | Freq: Every day | ORAL | Status: DC
  Administered 2023-04-08: 450 mg via ORAL
  Filled 2023-04-07 (×2): qty 6

## 2023-04-07 MED ORDER — ENCORAFENIB 75 MG PO CAPS
450.0000 mg | ORAL_CAPSULE | Freq: Every day | ORAL | Status: DC
Start: 2023-04-07 — End: 2023-04-07

## 2023-04-07 MED ORDER — MORPHINE SULFATE (PF) 4 MG/ML IV SOLN
4.0000 mg | Freq: Once | INTRAVENOUS | Status: AC
  Administered 2023-04-07: 4 mg via INTRAVENOUS
  Filled 2023-04-07: qty 1

## 2023-04-07 MED ORDER — LIDOCAINE 5 % EX PTCH
1.0000 | MEDICATED_PATCH | CUTANEOUS | Status: DC
  Administered 2023-04-08: 1 via TRANSDERMAL
  Filled 2023-04-07: qty 1

## 2023-04-07 MED ORDER — MELATONIN 3 MG PO TABS: 3.0000 mg | ORAL_TABLET | Freq: Every evening | ORAL | Status: DC | PRN

## 2023-04-07 MED ORDER — HYDROMORPHONE HCL 1 MG/ML IJ SOLN
0.5000 mg | INTRAMUSCULAR | Status: DC | PRN
  Administered 2023-04-07 (×3): 0.5 mg via INTRAVENOUS
  Filled 2023-04-07 (×3): qty 1

## 2023-04-07 MED ORDER — POTASSIUM CHLORIDE 10 MEQ/100ML IV SOLN
10.0000 meq | INTRAVENOUS | Status: AC
  Administered 2023-04-07 (×4): 10 meq via INTRAVENOUS
  Filled 2023-04-07 (×4): qty 100

## 2023-04-07 MED ORDER — SODIUM CHLORIDE 0.9 % IV SOLN
25.0000 mg | Freq: Four times a day (QID) | INTRAVENOUS | Status: DC | PRN
  Administered 2023-04-07: 25 mg via INTRAVENOUS
  Filled 2023-04-07: qty 25

## 2023-04-07 MED ORDER — ONDANSETRON HCL 4 MG/2ML IJ SOLN
4.0000 mg | Freq: Four times a day (QID) | INTRAMUSCULAR | Status: DC | PRN
  Administered 2023-04-07: 4 mg via INTRAVENOUS
  Filled 2023-04-07: qty 2

## 2023-04-07 NOTE — Progress Notes (Signed)
 Pharmacy Antibiotic Note  Laura [name] is a 58 y.o. female admitted on 04/06/2023 with sepsis.  Pharmacy has been consulted for Cefepime + Vancomycin dosing.  Plan: Cefepime 2gm IV q12h Vancomycin 1gm IV q36h to target AUC 400-550.  Estimated AUC on this regimen = 538. Monitor renal function and cx data    Height: 5\' 2"  (157.5 cm) Weight: 45.8 kg (101 lb) IBW/kg (Calculated) : 50.1  Temp (24hrs), Avg:99.7 F (37.6 C), Min:99.3 F (37.4 C), Max:100.1 F (37.8 C)  Recent Labs  Lab 04/01/23 0927 04/06/23 2040 04/06/23 2052 04/06/23 2224  WBC 4.4 3.7*  --   --   CREATININE 1.18* 1.15*  --   --   LATICACIDVEN  --   --  1.3 1.3    Estimated Creatinine Clearance: 40 mL/min (A) (by C-G formula based on SCr of 1.15 mg/dL (H)).    Allergies  Allergen Reactions   Aspirin Swelling and Other (See Comments)    SWELLING REACTION UNSPECIFIED   Tylenol [Acetaminophen] Swelling    Antimicrobials this admission: 3/11 Cefepime >>  3/11 Vancomycin >>  3/11 Metronidazole >>  Dose adjustments this admission:  Microbiology results: 3/11 BCx:  3/11 Resp PCR: negative  Thank you for allowing pharmacy to be a part of this patient's care.  Junita Push PharmD 04/07/2023 1:53 AM

## 2023-04-07 NOTE — ED Notes (Signed)
 Pt advised by pharmacy to resume home meds for cancer tx.  Pt advised, spouse has meds at bedside.

## 2023-04-07 NOTE — ED Notes (Signed)
 Pt received breakfast tray

## 2023-04-07 NOTE — H&P (Signed)
 History and Physical      Laura [name] ZOX:[id number] DOB: Sep 23, 1965 DOA: 04/06/2023; DOS: 04/07/2023  PCP: [name], [name]  Patient coming from: home   I have personally briefly reviewed patient's old medical records in [facility]  Chief Complaint: Subjective fever  HPI: Laura [name] is a 58 y.o. female with medical history significant for metastatic melanoma, hyperlipidemia, who is admitted to [facility] on 04/06/2023 with SIRS criteria after presenting from home to [facility] ED complaining of fever.   The patient reports 2 days of subjective fever with new onset sharp, midline, nonradiating low back discomfort in the absence of any recent preceding trauma or fall.  Her subjective fever has not been associated chills, rigors, or full body myalgias.  She denies any new neck stiffness, cough, shortness of breath, dysuria or gross hematuria.  No recent chest pain.  Regarding her low back discomfort, she denies any associated acute focal weakness or any associated acute focal numbness/paresthesias, including no saddle anesthesia.  Denies any recent fecal incontinence or urinary retention.  Her medical history is notable for metastatic melanoma, for which she follows with Dr. Cherly Hensen as her outpatient oncologist.    ED Course:  Vital signs in the ED were notable for the following: Temperature max 100.1; heart rates in the 130s to 140s initially, subsequent decreasing of the 1 teens following initiation of IV fluids and IV antibiotics, as further detailed below.;  Systolic blood pressures in the low 100s to 120s; respiratory rate 14-24, oxygen saturation 97 to 100% on room air.  Labs were notable for the following: CMP was notable for the following: Creatinine 1.15 compared to 1.18 on 04/01/2023, calcium adjusted for hypoalbuminemia noted to be 9.5, avidin 2.0, AST 46.  Otherwise, liver enzymes were within normal limits.  Lactic acid x 2 values were both found to be 1.3.  CBC  notable for white cell count 3700 with absolute neutrophil count of 2900 , hemoglobin 12.2, platelet count 287.  Urinalysis showed no white blood cells and was leukocyte esterase/nitrate negative.  Blood cultures x 2 were collected prior to initiation of IV antibiotics and COVID, influenza, RSV PCR were all found to be negative.  Per my interpretation, EKG in ED demonstrated the following: EKG showed sinus tachycardia with heart rate 154, normal intervals, and diffuse T wave inversion, with nonspecific ST depression in leads II, V4, V5, without any evidence of ST elevation.  Imaging in the ED, per corresponding formal radiology read, was notable for the following: CTA chest with PE protocol, in comparison to most recent prior CT scan from 12-24, shows no evidence of acute pulmonary embolism or any evidence of acute cardiopulmonary process, including evidence of evidence of edema, effusion, or pneumothorax, while showing interval development of T8 vertebral body fracture with fracture clefts involving both the superior and inferior endplate and posterior convex bowing of the vertebral body without significant canal stenosis.  The CT imaging also shows interval development of L1 vertebral body fracture with multiple fracture planes involving the anterior and posterior vertebral bodies consistent with a burst fracture without associated retropulsion or canal stenosis.  CT abdomen/pelvis with contrast showed no evidence of acute intra-abdominal or acute intrapelvic process.  While in the ED, the following were administered: Morphine 4 mg IV x 1 dose, Zofran 4 mg IV x 2 doses, IV vancomycin, cefepime, IV Flagyl.  Lactated Ringer's x 1.5 L bolus followed by initiation continuous LR running at 150 cc/h.  Subsequently, the patient  was admitted for further evaluation and management of presenting SIRS criteria as well as interval development of multilevel compression fractures in the setting of presenting acute low  back discomfort.    Review of Systems: As per HPI otherwise 10 point review of systems negative.   Past Medical History:  Diagnosis Date   Anxiety    Arthritis    Complication of anesthesia    Fibromyalgia    Gallstones 04/20/2018   GERD (gastroesophageal reflux disease)    History of blood transfusion    post Hysterectomy   Hypercholesteremia    Melanoma (HCC)    on back and excised   Migraine    PONV (postoperative nausea and vomiting)    Tobacco abuse    Vertebral artery disease (HCC)    history of left vertebral artery dissection 2011    Past Surgical History:  Procedure Laterality Date   ABDOMINAL HYSTERECTOMY     ANTERIOR FUSION CERVICAL SPINE  2008   BONE GRAFT HIP ILIAC CREST Left    Left Hip to Left Wrist   CHOLECYSTECTOMY N/A 04/20/2018   Procedure: LAPAROSCOPIC CHOLECYSTECTOMY;  Surgeon: Claud Kelp, MD;  Location: [facility] OR;  Service: General;  Laterality: N/A;   IR IMAGING GUIDED PORT INSERTION  03/05/2023   RADIOLOGY WITH ANESTHESIA N/A 12/17/2022   Procedure: MRI BRAIN WITH AND WITHOUT CONTRAST, MRI LUMBAR SPINE WITH AND WITHOUT CONTRAST WITH ANESTHESIA;  Surgeon: Radiologist, Medication, MD;  Location: [facility] OR;  Service: Radiology;  Laterality: N/A;   RADIOLOGY WITH ANESTHESIA N/A 03/04/2023   Procedure: MRI WITH ANESTHESIA OF BRAIN WITH AND WITHOUT CONTRAST;  Surgeon: Radiologist, Medication, MD;  Location: [facility] OR;  Service: Radiology;  Laterality: N/A;   thumb surgery Left 2018   [facility]- Tendons Carpal tunnel   WRIST SURGERY  1994    Social History:  reports that she quit smoking about 7 years ago. Her smoking use included cigarettes. She started smoking about 27 years ago. She has a 20 pack-year smoking history. She has never used smokeless tobacco. She reports that she does not drink alcohol and does not use drugs.   Allergies  Allergen Reactions   Aspirin Swelling and Other (See Comments)    SWELLING REACTION UNSPECIFIED   Tylenol [Acetaminophen]  Swelling    Family History  Problem Relation Age of Onset   Lung cancer Father        smoking hx   Breast cancer Sister        diagnosed over the age of 69   Breast cancer Sister        diagnosed over the age of 54    Family history reviewed and not pertinent    Prior to Admission medications   Medication Sig Start Date End Date Taking? Authorizing Provider  binimetinib (MEKTOVI) 15 MG tablet Take 3 tablets (45 mg total) by mouth 2 (two) times daily. 03/16/23   Melven Sartorius, MD  docusate sodium (COLACE) 100 MG capsule Take 100-200 mg by mouth See admin instructions. 200 mg in the morning, 100 mg at bedtime    [provider]  dronabinol (MARINOL) 10 MG capsule Take 1 capsule (10 mg total) by mouth 3 (three) times daily. Patient taking differently: Take 10 mg by mouth 2 (two) times daily before a meal. 02/12/23   Pickenpack-Cousar, Arty Baumgartner, NP  encorafenib (BRAFTOVI) 75 MG capsule Take 6 capsules (450 mg total) by mouth daily. 03/16/23   Melven Sartorius, MD  famotidine (PEPCID) 20 MG tablet Take 20  mg by mouth 2 (two) times daily.    [provider]  fentaNYL (DURAGESIC) 12 MCG/HR Place 1 patch onto the skin every 3 (three) days. 03/11/23   Pickenpack-Cousar, Arty Baumgartner, NP  gabapentin (NEURONTIN) 100 MG capsule Take 1 capsule (100 mg total) by mouth 2 (two) times daily. 03/11/23   Melven Sartorius, MD  glycopyrrolate (ROBINUL) 1 MG tablet Take 1 tablet (1 mg total) by mouth 2 (two) times daily as needed. 03/16/23   Pickenpack-Cousar, Arty Baumgartner, NP  lidocaine-prilocaine (EMLA) cream Apply to port site 30-45 min prior to appointment. 03/16/23   Melven Sartorius, MD  OLANZapine (ZYPREXA) 2.5 MG tablet Take 1 tablet (2.5 mg total) by mouth at bedtime. 03/30/23   Melven Sartorius, MD  ondansetron (ZOFRAN-ODT) 8 MG disintegrating tablet Dissolve 1 tablet (8 mg total) by mouth every 8 (eight) hours as needed for nausea or vomiting. 04/01/23   Melven Sartorius, MD  potassium chloride  (KLOR-CON M) 10 MEQ tablet Take 2 tablets (20 mEq total) by mouth 2 (two) times daily. 03/16/23   Melven Sartorius, MD  potassium chloride (KLOR-CON M) 10 MEQ tablet Take 2 tablets (20 mEq total) by mouth 2 (two) times daily. 03/16/23   Melven Sartorius, MD  prochlorperazine (COMPAZINE) 5 MG tablet Take 1 tablet (5 mg total) by mouth every 6 (six) hours as needed for nausea or vomiting. 03/01/23   Melven Sartorius, MD  rosuvastatin (CRESTOR) 10 MG tablet Take 10 mg by mouth at bedtime.    [provider]  scopolamine (TRANSDERM-SCOP) 1 MG/3DAYS Place 1 patch (1.5 mg total) onto the skin every 3 (three) days. 02/15/23   Pickenpack-Cousar, Arty Baumgartner, NP  traMADol (ULTRAM) 50 MG tablet Take 1-2 tablets (50-100 mg total) by mouth every 6 (six) hours as needed for moderate pain (pain score 4-6) or severe pain (pain score 7-10). 03/11/23   Pickenpack-Cousar, Arty Baumgartner, NP  zolpidem (AMBIEN CR) 12.5 MG CR tablet Take 1 tablet (12.5 mg total) by mouth at bedtime as needed for sleep. 03/19/23 03/18/24  Pickenpack-CousarArty Baumgartner, NP     Objective    Physical Exam: Vitals:   04/06/23 2330 04/07/23 0000 04/07/23 0048 04/07/23 0120  BP: 122/85 127/80    Pulse: (!) 129 (!) 128 (!) 167 (!) 132  Resp: 14 13 (!) 24 19  Temp:   100.1 F (37.8 C)   TempSrc:   Oral   SpO2: 97% 99% 98% 95%  Weight:      Height:        General: appears to be stated age; alert, oriented Skin: warm, dry, no rash Head:  AT/[place] Mouth:  Oral mucosa membranes appear dry, normal dentition Neck: supple; trachea midline Heart: Mildly tachycardic, but regular; did not appreciate any M/R/G Lungs: CTAB, did not appreciate any wheezes, rales, or rhonchi Abdomen: + BS; soft, ND, NT Vascular: 2+ pedal pulses b/l; 2+ radial pulses b/l Extremities: no peripheral edema, no muscle wasting Neuro: sensation intact in upper and lower extremities b/l; in the setting of her acute low back pain, I deferred evaluation strength involving the lower  extremities at this time.    Labs on Admission: I have personally reviewed following labs and imaging studies  CBC: Recent Labs  Lab 04/01/23 0927 04/06/23 2040  WBC 4.4 3.7*  NEUTROABS 2.7 2.9  HGB 11.0* 12.2  HCT 32.4* 36.5  MCV 95.0 95.5  PLT 304 287   Basic Metabolic Panel: Recent Labs  Lab 04/01/23  1610 04/06/23 2040  NA 135 134*  K 3.4* 3.7  CL 103 103  CO2 28 21*  GLUCOSE 89 103*  BUN 12 8  CREATININE 1.18* 1.15*  CALCIUM 7.7* 7.9*   GFR: Estimated Creatinine Clearance: 40 mL/min (A) (by C-G formula based on SCr of 1.15 mg/dL (H)). Liver Function Tests: Recent Labs  Lab 04/01/23 0927 04/06/23 2224  AST 20 46*  ALT 10 14  ALKPHOS 152* 113  BILITOT 0.5 0.6  PROT 4.8* 4.3*  ALBUMIN 2.5* 2.0*   No results for input(s): "LIPASE", "AMYLASE" in the last 168 hours. No results for input(s): "AMMONIA" in the last 168 hours. Coagulation Profile: No results for input(s): "INR", "PROTIME" in the last 168 hours. Cardiac Enzymes: No results for input(s): "CKTOTAL", "CKMB", "CKMBINDEX", "TROPONINI" in the last 168 hours. BNP (last 3 results) No results for input(s): "PROBNP" in the last 8760 hours. HbA1C: No results for input(s): "HGBA1C" in the last 72 hours. CBG: No results for input(s): "GLUCAP" in the last 168 hours. Lipid Profile: No results for input(s): "CHOL", "HDL", "LDLCALC", "TRIG", "CHOLHDL", "LDLDIRECT" in the last 72 hours. Thyroid Function Tests: No results for input(s): "TSH", "T4TOTAL", "FREET4", "T3FREE", "THYROIDAB" in the last 72 hours. Anemia Panel: No results for input(s): "VITAMINB12", "FOLATE", "FERRITIN", "TIBC", "IRON", "RETICCTPCT" in the last 72 hours. Urine analysis:    Component Value Date/Time   COLORURINE STRAW (A) 04/06/2023 2323   APPEARANCEUR CLEAR 04/06/2023 2323   LABSPEC 1.015 04/06/2023 2323   PHURINE 8.0 04/06/2023 2323   GLUCOSEU NEGATIVE 04/06/2023 2323   HGBUR NEGATIVE 04/06/2023 2323   HGBUR negative  01/07/2010 1438   BILIRUBINUR NEGATIVE 04/06/2023 2323   KETONESUR NEGATIVE 04/06/2023 2323   PROTEINUR NEGATIVE 04/06/2023 2323   UROBILINOGEN 0.2 05/01/2014 1000   NITRITE NEGATIVE 04/06/2023 2323   LEUKOCYTESUR NEGATIVE 04/06/2023 2323    Radiological Exams on Admission: CT Angio Chest PE W and/or Wo Contrast Result Date: 04/07/2023 CLINICAL DATA:  High probability pulmonary embolism, fever, chest congestion EXAM: CT ANGIOGRAPHY CHEST WITH CONTRAST TECHNIQUE: Multidetector CT imaging of the chest was performed using the standard protocol during bolus administration of intravenous contrast. Multiplanar CT image reconstructions and MIPs were obtained to evaluate the vascular anatomy. RADIATION DOSE REDUCTION: This exam was performed according to the departmental dose-optimization program which includes automated exposure control, adjustment of the mA and/or kV according to patient size and/or use of iterative reconstruction technique. CONTRAST:  75mL OMNIPAQUE IOHEXOL 350 MG/ML SOLN COMPARISON:  12/28/2022 FINDINGS: Cardiovascular: Adequate opacification of the pulmonary arterial tree. No intraluminal filling defect identified to suggest acute pulmonary embolism. Central pulmonary arteries are of normal caliber. No significant coronary artery calcification. There is thinning of the left ventricular apex suggesting prior myocardial infarction in this location. Mild left ventricular dilation. Global cardiac size within normal limits. Stable trace pericardial fluid. No pericardial effusion. Mild atherosclerotic calcification within the thoracic aorta. No aortic aneurysm. Right internal jugular chest port tip seen within the superior right atrium. Mediastinum/Nodes: Previously noted pathologic mediastinal adenopathy has resolved. Visualized thyroid is unremarkable. Esophagus is unremarkable. Lungs/Pleura: Left upper lobe pulmonary nodule favored to represent a pulmonary metastasis has undergone interval  cavitation decrease in size now measuring 10 mm in keeping with interval response to therapy. Mild emphysema. Mild bibasilar atelectasis. No pneumothorax or pleural effusion. Upper Abdomen: No acute abnormality. Musculoskeletal: Innumerable lytic lesions are seen throughout the axial skeleton predominantly within the sternum and thoracolumbar spine, with development of pathologic fractures of the distal sternal body, T3  and T4 vertebral bodies with resultant accentuated focal thoracic kyphosis, T8 vertebral body with fracture clefts involving both the superior and inferior endplate and posterior convex bowing the vertebral body without significant canal stenosis. L1 vertebral body with multiple fracture planes involving the anterior and posterior vertebral bodies in keeping a burst fracture without associated retropulsion or canal stenosis. Resultant lumbar dextroscoliosis secondary to asymmetric left vertebral body height loss. Fracture anteroinferior aspect of the L2 vertebral body also noted. Despite this, there is increasing sclerosis of the axial skeleton and multiple largely lytic lesions with no soft tissue component on prior examination demonstrate interval decrease in size and/or resolution of the soft tissue component in keeping with response to therapy. Review of the MIP images confirms the above findings. IMPRESSION: 1. No pulmonary embolism. No acute intrathoracic pathology identified. 2. Interval response to therapy. Interval cavitation and decrease in size of the left upper lobe pulmonary nodule favored to represent a pulmonary metastasis. Previously noted pathologic mediastinal adenopathy has resolved. Extensive osseous metastatic disease demonstrates probable interval response to therapy with resolution of multiple metastatic soft tissue masses improvement in numerous lytic lesions. 3. Interval development of numerous pathologic fractures as outlined above. Surgical consultation for consideration of  stabilization T8 and L1 fractures is recommended. Aortic Atherosclerosis (ICD10-I70.0) and Emphysema (ICD10-J43.9). Electronically Signed   By: Helyn Numbers M.D.   On: 04/07/2023 00:21   CT ABDOMEN PELVIS W CONTRAST Result Date: 04/07/2023 CLINICAL DATA:  Sepsis fever x2 days with congestion. Pt denies SOB. Reports body aches and a "fire-poker" pain in tail bone. HR 150 in triage. Pt denies CP, dizziness. Metastatic melanoma. EXAM: CT ABDOMEN AND PELVIS WITH CONTRAST TECHNIQUE: Multidetector CT imaging of the abdomen and pelvis was performed using the standard protocol following bolus administration of intravenous contrast. RADIATION DOSE REDUCTION: This exam was performed according to the departmental dose-optimization program which includes automated exposure control, adjustment of the mA and/or kV according to patient size and/or use of iterative reconstruction technique. CONTRAST:  75mL OMNIPAQUE IOHEXOL 350 MG/ML SOLN COMPARISON:  PET CT 02/15/2023, CT angiography chest 04/06/2023, x-ray lumbar spine 03/10/2023 FINDINGS: Lower chest: Please see separately dictated CT angiography chest 04/06/2023. Port-A-Cath tip at the superior cavoatrial junction. Hepatobiliary: Redemonstration of scattered multiple hepatic hypodense lesions that appear smaller in size with as an example a left hepatic lobe lesion measuring 3.3 x 2.7 cm (from 7.5 x 4.8 cm). Status post cholecystectomy. No biliary dilatation. Pancreas: No focal lesion. Normal pancreatic contour. No surrounding inflammatory changes. No main pancreatic ductal dilatation. Spleen: Normal in size without focal abnormality.  Splenule noted. Adrenals/Urinary Tract: No adrenal nodule bilaterally. Bilateral kidneys enhance symmetrically. No hydronephrosis. No hydroureter. The urinary bladder is unremarkable. Stomach/Bowel: Stomach is within normal limits. No evidence of bowel wall thickening or dilatation. Appendix appears normal. Vascular/Lymphatic: No abdominal  aorta or iliac aneurysm. Severe atherosclerotic plaque of the aorta and its branches. No abdominal, pelvic, or inguinal lymphadenopathy. Reproductive: Status post hysterectomy. No adnexal masses. Other: No intraperitoneal free fluid. No intraperitoneal free gas. No organized fluid collection. Musculoskeletal: No abdominal wall hernia or abnormality. Diffuse heterogeneous appearance of the axial and appendicular skeleton with underlying lytic metastatic lesions. No acute displaced fracture. Chronic L5 burst fracture with is at least 85% vertebral body height loss and 7 mm retropulsion into the central canal. Chronic L1 compression fracture with at least 70% vertebral body height loss. Chronic L2 vertebral body fracture anterior inferiorly. IMPRESSION: 1. No acute intra-abdominal or intrapelvic abnormality in a patient with  metastatic disease. 2. Interval decrease in size of multiple hepatic metastases. 3. Axial and appendicular skeleton lytic metastatic lesions. 4. Chronic L1, L2, L5 fractures. 5.  Aortic Atherosclerosis (ICD10-I70.0). Electronically Signed   By: Tish Frederickson M.D.   On: 04/07/2023 00:08   DG Chest 2 View Result Date: 04/06/2023 CLINICAL DATA:  Fever for 2 days with congestion.  Body aches. EXAM: CHEST - 2 VIEW COMPARISON:  01/03/2023, 03/17/2022. FINDINGS: The heart size and mediastinal contours are within normal limits. No consolidation, effusion, or pneumothorax. A right-sided chest port terminates over the superior vena cava. Cervical spinal fusion hardware is noted. There is a compression deformity in the midthoracic spine with with loss of vertebral body height of 40%. No retropulsed element is seen. The previously described lytic lesions in the bones are not well demonstrated radiographically. IMPRESSION: 1. No active cardiopulmonary disease. 2. Compression deformity in the midthoracic spine, which is new from the previous exam and possible pathologic fracture given history of osseous  metastasis. Electronically Signed   By: Thornell Sartorius M.D.   On: 04/06/2023 22:34      Assessment/Plan    Principal Problem:   SIRS (systemic inflammatory response syndrome) (HCC) Active Problems:   Metastatic melanoma (HCC)   Acute low back pain   Leukopenia   Closed wedge compression fracture of T8 vertebra (HCC)   Closed compression fracture of body of L1 vertebra (HCC)   HLD (hyperlipidemia)     #) SIRS criteria present: Presentation associated with leukopenia, with tachycardia and tachypnea.  He also has an elevated temperature that does not meet quantitative threshold for fever, noting temperature max in the ED of 100.1.  Patient has a history of metastatic melanoma on chemotherapy, and while she is leukopenic, she is not currently neutropenic, noting presenting absolute neutrophil count of 2900.  She has been normotensive throughout her ED course.  Lactic acid x 2 were nonelevated, as further quantified above  No overt evidence of specific underlying infectious process, including CT a chest which showed no evidence of infiltrate to suggest pneumonia, while CT Abdo/pelvis showed no evidence of acute intra-abdominal or acute intrapelvic process, urinalysis was inconsistent with UTI, and COVID, influenza, RSV PCR were all negative.  Blood cultures x 2 were collected prior to initiation of IV vancomycin, cefepime, and Flagyl.   In the absence of overt underlying infection at this time, criteria are not currently met for sepsis.  However, in this patient that is immunocompromised in the setting of metastatic melanoma on chemotherapy, is already leukopenic, will continue IV antibiotics overnight, and consult her outpatient oncologist for further recommendations.   Of note, she reports a history of allergy to Tylenol, will proceed with ibuprofen on as needed basis as preferred antipyretic, noting history of peripheral edema and response to NSAIDs, as a known common nonallergic side  effect.  Plan: Repeat CBC with diff in the AM.  Monitor strict I's and O's and daily weights.  Monitor on telemetry.  Follow-up result of blood cultures x 2.  Check ESR, CRP.  MRI of the thoracic and lumbar spine, setting of acute low back pain.  For now, continue IV vancomycin and cefepime. I have added her outpatient oncologist, Dr. Geanie Berlin to the treatment team starting at 7 AM on 3/12.                     #) Acute multilevel vertebral body compression fractures: The setting of presenting acute low back discomfort, without any evidence  of red flag signs, today's CT a chest, relative to CT imaging from 12-24 shows interval development of multi level vertebral body compression fractures, most notably at T8 and L1, as further detailed above. Will likely ultimately warrant neurosurgery consultation to assess for surgical intervention, but without need for urgent overnight intervention given the absence of any red flag symptoms.  No beds at [facility] currently available.C in the meantime, we will further assess the pathologic nature of his fractures, including further assessment of canal/cord involvement.   Plan: MRI of the thoracic and lumbar spine with and without contrast.  Prn IV Ativan prior to MRI imaging given the patient's history of claustrophobia.  Add CRP, ESR.  Lidoderm patch.  As needed IV Dilaudid.  Fall precautions ordered.  PT/OT. I have added her outpatient oncologist, Dr. Geanie Berlin to the treatment team starting at 7 AM on 3/12.  Continue outpatient gabapentin for neuropathic pain.                  #) Metastatic melanoma: Documented history of such, for which she follows with Dr. Cherly Hensen as her outpatient oncologist.  Appears to be on Binimetinib and Encorafenib as an outpatient.    Plan: I have added her outpatient oncologist, Dr. Geanie Berlin to the treatment team starting at 7 AM on 3/12.  Continue outpatient Binimetinib and Encorafeni.                      #) Hyperlipidemia: documented h/o such. On rosuvastatin as outpatient.   Plan: continue home statin.      DVT prophylaxis: SCD's   Code Status: Full code Family Communication: none Disposition Plan: Per Rounding Team Consults called: I have added her outpatient oncologist, Dr. Geanie Berlin to the treatment team starting at 7 AM on 3/12. ;  Admission status: Inpatient    I SPENT GREATER THAN 75  MINUTES IN CLINICAL CARE TIME/MEDICAL DECISION-MAKING IN COMPLETING THIS ADMISSION.     Chaney Born Abbigaile Rockman DO [facility] From 7PM - 7AM   04/07/2023, 1:39 AM

## 2023-04-07 NOTE — ED Notes (Addendum)
 Pt has remained tachycardic,  pt is actively vomiting also causing spikes in HR,  she stated phenergan was the only this that has helped recently.

## 2023-04-07 NOTE — ED Notes (Signed)
 Pt unable to consume breakfast, one episode of emesis - zofran provided

## 2023-04-07 NOTE — ED Notes (Signed)
 Pt ambulated to and from BR w/o need of assistance.

## 2023-04-07 NOTE — Progress Notes (Signed)
 Patient ID: Laura [name], female   DOB: 01/11/1966, 58 y.o.   MRN: [id number] Spoke with Dr. Cherly Hensen today. Unlikely that instrumentation could be used as there is widespread metastatic disease in the spine. I have recommended a thoracic, lumbar mri with and without contrast. Will review and speak with patient after.

## 2023-04-07 NOTE — Progress Notes (Signed)
 [facility] Progress Note  Patient: Laura [name]     WUJ:[id number]  DOA: 04/06/2023   PCP: [name], [name]       Brief hospital course: This is a 58 year old female with metastatic melanoma who presents to the hospital with 2 days of fever.  Temperature noted to be 100.1 degree in the ED.  Heart rate in the 130s to 140s.  She has no complaints of cough, shortness of breath or flulike symptoms.  She admits to having chronic nausea.  No complaints of dysuria or new rash.  She also admits to a new pain in her left buttock area which feels like a "hot poker" and does not radiate down her leg. In the ED, the patient was felt to have sepsis and started on broad-spectrum antibiotics. Imaging also revealed interval development of  T8 and L1 vertebral body fractures.  Subjective:  Having dry heaves today with severe nausea.  Not able to drink liquids.  At home she was unable to tolerate solid food and was mostly dependent on liquids.  No complaints of abdominal pain and no diarrhea.  Assessment and Plan: Principal Problem:   SIRS (systemic inflammatory response syndrome)  -Low-grade fevers at home-tachycardia in the hospital - Blood cultures pending - Respiratory panel negative-no respiratory symptoms - UA negative - If blood cultures remain negative over the next 24 hours, would recommend discontinuing antibiotics  Active Problems: Nausea/vomiting - The patient has chronic nausea and feels it may be secondary to her "cancer medication" - Continue IV fluids and antiemetics  Hypokalemia - Secondary to above - Replace    Metastatic melanoma -Holding binimetinib and encorafenib    Acute left sacral pain New compression fractures in T8 and L1 - Etiology unclear but she does have numerous bone metastases which could be the cause -Continue fentanyl patch, gabapentin       Code Status: Full Code Total time on patient care: 35 min DVT prophylaxis:  SCDs Start:  04/07/23 0132     Objective:   Vitals:   04/07/23 0910 04/07/23 1100 04/07/23 1300 04/07/23 1412  BP: 119/81 122/81 124/87   Pulse: (!) 114 (!) 105 (!) 106   Resp: 14 16 (!) 21   Temp: 98.9 F (37.2 C)   98.8 F (37.1 C)  TempSrc: Oral   Oral  SpO2: 99% 94% 95%   Weight:      Height:       Filed Weights   04/06/23 2028  Weight: 45.8 kg   Exam: General exam: Appears uncomfortable - retching and vomiting HEENT: oral mucosa moist Respiratory system: Clear to auscultation.  Cardiovascular system: S1 & S2 heard  Gastrointestinal system: Abdomen soft, non-tender, nondistended. Normal bowel sounds   Extremities: No cyanosis, clubbing - mild edema of right foot Psychiatry:  Mood & affect appropriate.   CBC: Recent Labs  Lab 04/01/23 0927 04/06/23 2040 04/07/23 0600  WBC 4.4 3.7* 2.6*  NEUTROABS 2.7 2.9 1.9  HGB 11.0* 12.2 9.7*  HCT 32.4* 36.5 29.2*  MCV 95.0 95.5 97.0  PLT 304 287 220   Basic Metabolic Panel: Recent Labs  Lab 04/01/23 0927 04/06/23 2040 04/06/23 2336 04/07/23 0600  NA 135 134*  --  131*  K 3.4* 3.7  --  3.6  CL 103 103  --  103  CO2 28 21*  --  23  GLUCOSE 89 103*  --  95  BUN 12 8  --  8  CREATININE 1.18* 1.15*  --  1.00  CALCIUM 7.7* 7.9*  --  7.2*  MG  --   --  1.8 1.9     Scheduled Meds:  binimetinib  45 mg Oral BID   encorafenib  450 mg Oral Daily   famotidine  20 mg Oral BID   gabapentin  100 mg Oral QPM   lidocaine  1 patch Transdermal Q24H   LORazepam  0.5 mg Intravenous Once   rosuvastatin  10 mg Oral QHS    Imaging and lab data personally reviewed   Author: Calvert Cantor  04/07/2023 3:18 PM  To contact [facility]>   Check the care team in [facility] and look for the attending/consulting [facility] provider listed  Log into [email or web address] and use [facility] universal password   Go to> "[facility]"  and find provider  If you still have difficulty reaching the provider, please page the [facility] (Director on Call) for the  Hospitalists listed on amion

## 2023-04-07 NOTE — Telephone Encounter (Signed)
 Leotis Shames, RN reported that pt did not show up for her IVF appt today at 0830 and she noted that pt was in the ED. TC to pt's husband, Jorja Loa, who states that he took pt to the ED yesterday evening due to fever of 101. He says that she had a fever 2 days prior as well, but it had resolved until yesterday evening. He reports that pt was informed in the ED that she had fractured another vertebrae, and the plan at this point is for pt to be admitted to the hospital. Husband concerned that pt has not had her oral chemo today. Encouraged him to request that admitting MD write an order for pt to be allowed to take home medication (oral chemo), and he verbalizes understanding. Informed Tim that Dr. Cherly Hensen would be notified about pt's hospitalization.

## 2023-04-07 NOTE — ED Notes (Signed)
 Pt off the floor to MRI,  MRI will message ER RN when complete to meet at elevator so ER RN can take patient to [phone]

## 2023-04-07 NOTE — ED Notes (Signed)
 Pt reports having heartburn and burping.  MD notified

## 2023-04-07 NOTE — Progress Notes (Addendum)
 Laura [name]   DOB:Dec 23, 1965   EA#:[id number]    ASSESSMENT & PLAN:  58 y.o. w with know metastatic melanoma with liver, brain and bone metastases presented with fever, pain the buttock found new T spine fracture. Clinically pain mainly in the buttock. ROS found visual change with persistent blurred vision, nausea difficulty to control. Discussed with obtain MRI of sacrum and brain.  Discussed CT findings, bone metastases is progression though very good response in the liver. Her LFT essentially normalized. Spoke to Neurosurgery, will obtain MRI of T spine.   Discussed GOC again. Patient is tearful as expected. She denies feeling worse clinically. She was walking and doing more in the house and attributed her buttock pain from more activities.  Discussed treatment may not result in persistent response given her progression on CT even though some areas are. If CNS progression, hospice is reasonable. She would like continue treatment for now and we will obtain further imaging.  MRI ordered. Ok to use Ativan 1 mg before MRI IV phenergan as needed. Continue IVF Agree if no growth from cultures in the next 24 hours, stop antibiotics Continue pain control Continue home Mektovi and BRAFtovi.   Will continue to follow with you.  All questions were answered.    The total time spent in the appointment was 60 minutes encounter with patients including review of chart and various tests results, discussions about plan of care and coordination of care plan  Melven Sartorius, MD 04/07/2023 4:46 PM  Subjective:  Laura [name] came in due to fever. Denies any s/o UTI, dysuria, coughing, short of breath, abdominal pain, diarrhea, open wound. Pain in the buttock comes and goes worsening but report she has been walking in the house and doing more activities. She thought she may have over do it. She has no paresthesia, lower extremity weakness. CT showed new pathologic fracture in the T8 and L1. There is no new mid  back pain, no band like sensation around the chest or mid body.   Objective:  Vitals:   04/07/23 1412 04/07/23 1610  BP:  134/86  Pulse:  (!) 104  Resp:  11  Temp: 98.8 F (37.1 C)   SpO2:  99%     Intake/Output Summary (Last 24 hours) at 04/07/2023 1646 Last data filed at 04/07/2023 0951 Gross per 24 hour  Intake 201.01 ml  Output --  Net 201.01 ml    GENERAL: alert, no distress and comfortable SKIN: skin color pale EYES: normal, sclera clear OROPHARYNX: moist, no exudate, no erythema.  LUNGS:  normal breathing effort.  ABDOMEN: abdomen soft, non-tender and non-distended Musculoskeletal: no lower extremity edema NEURO: alert with fluent speech, no focal motor/sensory deficits. Strength and sensation equal bilaterally. CN II-XII intact   Labs:  Recent Labs    04/01/23 0927 04/06/23 2040 04/06/23 2224 04/07/23 0600  NA 135 134*  --  131*  K 3.4* 3.7  --  3.6  CL 103 103  --  103  CO2 28 21*  --  23  GLUCOSE 89 103*  --  95  BUN 12 8  --  8  CREATININE 1.18* 1.15*  --  1.00  CALCIUM 7.7* 7.9*  --  7.2*  GFRNONAA 55* 56*  --  >60  PROT 4.8*  --  4.3* 4.2*  ALBUMIN 2.5*  --  2.0* 2.0*  AST 20  --  46* 33  ALT 10  --  14 13  ALKPHOS 152*  --  113 112  BILITOT 0.5  --  0.6 0.5  BILIDIR  --   --  0.2  --   IBILI  --   --  0.4  --     Studies:  CT Angio Chest PE W and/or Wo Contrast Result Date: 04/07/2023 CLINICAL DATA:  High probability pulmonary embolism, fever, chest congestion EXAM: CT ANGIOGRAPHY CHEST WITH CONTRAST TECHNIQUE: Multidetector CT imaging of the chest was performed using the standard protocol during bolus administration of intravenous contrast. Multiplanar CT image reconstructions and MIPs were obtained to evaluate the vascular anatomy. RADIATION DOSE REDUCTION: This exam was performed according to the departmental dose-optimization program which includes automated exposure control, adjustment of the mA and/or kV according to patient size and/or use  of iterative reconstruction technique. CONTRAST:  75mL OMNIPAQUE IOHEXOL 350 MG/ML SOLN COMPARISON:  12/28/2022 FINDINGS: Cardiovascular: Adequate opacification of the pulmonary arterial tree. No intraluminal filling defect identified to suggest acute pulmonary embolism. Central pulmonary arteries are of normal caliber. No significant coronary artery calcification. There is thinning of the left ventricular apex suggesting prior myocardial infarction in this location. Mild left ventricular dilation. Global cardiac size within normal limits. Stable trace pericardial fluid. No pericardial effusion. Mild atherosclerotic calcification within the thoracic aorta. No aortic aneurysm. Right internal jugular chest port tip seen within the superior right atrium. Mediastinum/Nodes: Previously noted pathologic mediastinal adenopathy has resolved. Visualized thyroid is unremarkable. Esophagus is unremarkable. Lungs/Pleura: Left upper lobe pulmonary nodule favored to represent a pulmonary metastasis has undergone interval cavitation decrease in size now measuring 10 mm in keeping with interval response to therapy. Mild emphysema. Mild bibasilar atelectasis. No pneumothorax or pleural effusion. Upper Abdomen: No acute abnormality. Musculoskeletal: Innumerable lytic lesions are seen throughout the axial skeleton predominantly within the sternum and thoracolumbar spine, with development of pathologic fractures of the distal sternal body, T3 and T4 vertebral bodies with resultant accentuated focal thoracic kyphosis, T8 vertebral body with fracture clefts involving both the superior and inferior endplate and posterior convex bowing the vertebral body without significant canal stenosis. L1 vertebral body with multiple fracture planes involving the anterior and posterior vertebral bodies in keeping a burst fracture without associated retropulsion or canal stenosis. Resultant lumbar dextroscoliosis secondary to asymmetric left vertebral  body height loss. Fracture anteroinferior aspect of the L2 vertebral body also noted. Despite this, there is increasing sclerosis of the axial skeleton and multiple largely lytic lesions with no soft tissue component on prior examination demonstrate interval decrease in size and/or resolution of the soft tissue component in keeping with response to therapy. Review of the MIP images confirms the above findings. IMPRESSION: 1. No pulmonary embolism. No acute intrathoracic pathology identified. 2. Interval response to therapy. Interval cavitation and decrease in size of the left upper lobe pulmonary nodule favored to represent a pulmonary metastasis. Previously noted pathologic mediastinal adenopathy has resolved. Extensive osseous metastatic disease demonstrates probable interval response to therapy with resolution of multiple metastatic soft tissue masses improvement in numerous lytic lesions. 3. Interval development of numerous pathologic fractures as outlined above. Surgical consultation for consideration of stabilization T8 and L1 fractures is recommended. Aortic Atherosclerosis (ICD10-I70.0) and Emphysema (ICD10-J43.9). Electronically Signed   By: Helyn Numbers M.D.   On: 04/07/2023 00:21   CT ABDOMEN PELVIS W CONTRAST Result Date: 04/07/2023 CLINICAL DATA:  Sepsis fever x2 days with congestion. Pt denies SOB. Reports body aches and a "fire-poker" pain in tail bone. HR 150 in triage. Pt denies CP, dizziness. Metastatic melanoma. EXAM: CT ABDOMEN AND PELVIS WITH CONTRAST  TECHNIQUE: Multidetector CT imaging of the abdomen and pelvis was performed using the standard protocol following bolus administration of intravenous contrast. RADIATION DOSE REDUCTION: This exam was performed according to the departmental dose-optimization program which includes automated exposure control, adjustment of the mA and/or kV according to patient size and/or use of iterative reconstruction technique. CONTRAST:  75mL OMNIPAQUE  IOHEXOL 350 MG/ML SOLN COMPARISON:  PET CT 02/15/2023, CT angiography chest 04/06/2023, x-ray lumbar spine 03/10/2023 FINDINGS: Lower chest: Please see separately dictated CT angiography chest 04/06/2023. Port-A-Cath tip at the superior cavoatrial junction. Hepatobiliary: Redemonstration of scattered multiple hepatic hypodense lesions that appear smaller in size with as an example a left hepatic lobe lesion measuring 3.3 x 2.7 cm (from 7.5 x 4.8 cm). Status post cholecystectomy. No biliary dilatation. Pancreas: No focal lesion. Normal pancreatic contour. No surrounding inflammatory changes. No main pancreatic ductal dilatation. Spleen: Normal in size without focal abnormality.  Splenule noted. Adrenals/Urinary Tract: No adrenal nodule bilaterally. Bilateral kidneys enhance symmetrically. No hydronephrosis. No hydroureter. The urinary bladder is unremarkable. Stomach/Bowel: Stomach is within normal limits. No evidence of bowel wall thickening or dilatation. Appendix appears normal. Vascular/Lymphatic: No abdominal aorta or iliac aneurysm. Severe atherosclerotic plaque of the aorta and its branches. No abdominal, pelvic, or inguinal lymphadenopathy. Reproductive: Status post hysterectomy. No adnexal masses. Other: No intraperitoneal free fluid. No intraperitoneal free gas. No organized fluid collection. Musculoskeletal: No abdominal wall hernia or abnormality. Diffuse heterogeneous appearance of the axial and appendicular skeleton with underlying lytic metastatic lesions. No acute displaced fracture. Chronic L5 burst fracture with is at least 85% vertebral body height loss and 7 mm retropulsion into the central canal. Chronic L1 compression fracture with at least 70% vertebral body height loss. Chronic L2 vertebral body fracture anterior inferiorly. IMPRESSION: 1. No acute intra-abdominal or intrapelvic abnormality in a patient with metastatic disease. 2. Interval decrease in size of multiple hepatic metastases. 3.  Axial and appendicular skeleton lytic metastatic lesions. 4. Chronic L1, L2, L5 fractures. 5.  Aortic Atherosclerosis (ICD10-I70.0). Electronically Signed   By: Tish Frederickson M.D.   On: 04/07/2023 00:08   DG Chest 2 View Result Date: 04/06/2023 CLINICAL DATA:  Fever for 2 days with congestion.  Body aches. EXAM: CHEST - 2 VIEW COMPARISON:  01/03/2023, 03/17/2022. FINDINGS: The heart size and mediastinal contours are within normal limits. No consolidation, effusion, or pneumothorax. A right-sided chest port terminates over the superior vena cava. Cervical spinal fusion hardware is noted. There is a compression deformity in the midthoracic spine with with loss of vertebral body height of 40%. No retropulsed element is seen. The previously described lytic lesions in the bones are not well demonstrated radiographically. IMPRESSION: 1. No active cardiopulmonary disease. 2. Compression deformity in the midthoracic spine, which is new from the previous exam and possible pathologic fracture given history of osseous metastasis. Electronically Signed   By: Thornell Sartorius M.D.   On: 04/06/2023 22:34   XR Lumbar Spine Complete Result Date: 03/10/2023 XRs of the lumbar spine from 03/10/2023 were independently reviewed and interpreted, showing scoliosis with apex to the right at L1.  Fracture seen of the L5 vertebral with height loss that measures 52% when compared to the L4 vertebra.  Anterior height loss noted at the L1 vertebra consistent with fracture. No other fractures seen.  No evidence of instability on flexion/extension views.

## 2023-04-07 NOTE — ED Notes (Signed)
Pt updated on MRI

## 2023-04-07 NOTE — Progress Notes (Signed)
 PT Cancellation Note  Patient Details Name: Laura [name] MRN: [id number] DOB: [birth year]   Cancelled Treatment:    Reason Eval/Treat Not Completed: Patient not medically ready MRI spine pending, Noted vertebral fractures,radiology results recommend surgical consult for stabilization . Will require recommendations for  precautions prior to proceeding with PT for   mobility.  Will follow and initiate when cleared .  [name] PT Acute Rehabilitation Services Office [phone]    Laura [name] 04/07/2023, 7:16 AM

## 2023-04-07 NOTE — ED Provider Notes (Signed)
 Patient signed out pending CT imaging.  CT imaging does not show any obvious source of infection.  Could be early sepsis or bacteremia.  No evidence of PE.  There are 2 new pathologic fractures of T8 and L1.  1 concerning for potential need for intervention.  On my repeat evaluation, patient is persistently tachycardic.  She is now complaining of a migraine headache.  Given her persistent tachycardia and concerns for bacteremia, will plan for admit.  Will defer consultation with neurosurgery to admitting team for daytime consultation as this is not emergent.  Physical Exam  BP 122/85   Pulse (!) 129   Temp 99.3 F (37.4 C)   Resp 14   Ht 1.575 m (5\' 2" )   Wt 45.8 kg   SpO2 97%   BMI 18.47 kg/m   Physical Exam Awake, alert, no acute distress, tachycardic Procedures  Procedures  ED Course / MDM   Clinical Course as of 04/07/23 0047  Tue Apr 06, 2023  2321 CBC shows white count decreased.  No anemia.  Metabolic panel shows slight decrease in sodium, decreased calcium increased creatinine [JK]  2321 Chest x-ray without signs of pneumonia.  Possible new compression deformity in the mid thoracic spine [JK]    Clinical Course User Index [JK] Linwood Dibbles, MD   Medical Decision Making Amount and/or Complexity of Data Reviewed Labs: ordered. Radiology: ordered.  Risk Prescription drug management. Decision regarding hospitalization.   Problem List Items Addressed This Visit       Other   Tachycardia   Other Visit Diagnoses       Pathological fracture of vertebra due to neoplastic disease, initial encounter    -  Primary     Fever, unspecified fever cause                 Shon Baton, MD 04/07/23 [phone]

## 2023-04-07 NOTE — Progress Notes (Addendum)
 Prior-To-Admission Oral Chemotherapy for Treatment of Oncologic Disease   Order noted from Dr. Arlean Hopping to continue prior-to-admission oral chemotherapy regimen of binimetinib/encorafenib.  Procedure Per Pharmacy & Therapeutics Committee Policy: Orders for continuation of home oral chemotherapy for treatment of an oncologic disease will be held unless approved by an oncologist during current admission.    For patients receiving oncology care at [facility], inpatient pharmacist contacts patient's oncologist during regular office hours to review. If earlier review is medically necessary, attending physician consults [facility] on-call oncologist   For patients receiving oncology care outside of [facility], attending physician consults patient's oncologist to review. If this oncologist or their coverage cannot be reached, attending physician consults [facility] on-call oncologist   Oral chemotherapy continuation order is on hold pending oncologist review, [facility] oncologist Cherly Hensen will be notified by inpatient pharmacy during office hours   Junita Push Pharmd 04/07/2023, 6:27 AM   Contacted Dr Cherly Hensen via Secure Chat, he indicated should continue the [facility] and [facility] during this admission.    Will contact patient/family to obtain home supply for dispensing from Pharmacy.   Otho Bellows PharmD 9:43 AM 04/07/2023

## 2023-04-08 ENCOUNTER — Other Ambulatory Visit (HOSPITAL_COMMUNITY): Payer: Self-pay

## 2023-04-08 ENCOUNTER — Other Ambulatory Visit: Payer: Self-pay

## 2023-04-08 ENCOUNTER — Other Ambulatory Visit: Payer: Self-pay | Admitting: Nurse Practitioner

## 2023-04-08 DIAGNOSIS — E876 Hypokalemia: Secondary | ICD-10-CM

## 2023-04-08 DIAGNOSIS — R6510 Systemic inflammatory response syndrome (SIRS) of non-infectious origin without acute organ dysfunction: Secondary | ICD-10-CM | POA: Diagnosis not present

## 2023-04-08 DIAGNOSIS — G893 Neoplasm related pain (acute) (chronic): Secondary | ICD-10-CM

## 2023-04-08 DIAGNOSIS — C7951 Secondary malignant neoplasm of bone: Secondary | ICD-10-CM | POA: Diagnosis not present

## 2023-04-08 DIAGNOSIS — K5909 Other constipation: Secondary | ICD-10-CM | POA: Diagnosis not present

## 2023-04-08 DIAGNOSIS — S22070A Wedge compression fracture of T9-T10 vertebra, initial encounter for closed fracture: Secondary | ICD-10-CM

## 2023-04-08 DIAGNOSIS — M8458XA Pathological fracture in neoplastic disease, other specified site, initial encounter for fracture: Secondary | ICD-10-CM | POA: Diagnosis not present

## 2023-04-08 DIAGNOSIS — R112 Nausea with vomiting, unspecified: Secondary | ICD-10-CM

## 2023-04-08 DIAGNOSIS — C439 Malignant melanoma of skin, unspecified: Secondary | ICD-10-CM | POA: Diagnosis not present

## 2023-04-08 LAB — COMPREHENSIVE METABOLIC PANEL
ALT: 11 U/L (ref 0–44)
AST: 21 U/L (ref 15–41)
Albumin: 1.6 g/dL — ABNORMAL LOW (ref 3.5–5.0)
Alkaline Phosphatase: 88 U/L (ref 38–126)
Anion gap: 4 — ABNORMAL LOW (ref 5–15)
BUN: 9 mg/dL (ref 6–20)
CO2: 22 mmol/L (ref 22–32)
Calcium: 7.3 mg/dL — ABNORMAL LOW (ref 8.9–10.3)
Chloride: 108 mmol/L (ref 98–111)
Creatinine, Ser: 0.92 mg/dL (ref 0.44–1.00)
GFR, Estimated: >60 mL/min (ref >60)
Glucose, Bld: 103 mg/dL — ABNORMAL HIGH (ref 70–99)
Potassium: 3.5 mmol/L (ref 3.5–5.1)
Sodium: 134 mmol/L — ABNORMAL LOW (ref 135–145)
Total Bilirubin: 0.6 mg/dL (ref 0.0–1.2)
Total Protein: 3.5 g/dL — ABNORMAL LOW (ref 6.5–8.1)

## 2023-04-08 LAB — LACTATE DEHYDROGENASE: LDH: 260 U/L — ABNORMAL HIGH (ref 98–192)

## 2023-04-08 MED ORDER — PROMETHAZINE HCL 25 MG PO TABS
25.0000 mg | ORAL_TABLET | Freq: Three times a day (TID) | ORAL | 0 refills | Status: DC | PRN
  Filled 2023-04-08: qty 60, 20d supply, fill #0

## 2023-04-08 MED ORDER — IBUPROFEN 600 MG PO TABS
600.0000 mg | ORAL_TABLET | Freq: Four times a day (QID) | ORAL | 0 refills | Status: DC | PRN
Start: 2023-04-08 — End: 2023-05-20
  Filled 2023-04-08: qty 30, 8d supply, fill #0

## 2023-04-08 MED ORDER — FENTANYL 12 MCG/HR TD PT72
1.0000 | MEDICATED_PATCH | TRANSDERMAL | Status: DC
  Administered 2023-04-08: 1 via TRANSDERMAL
  Filled 2023-04-08: qty 1

## 2023-04-08 MED ORDER — HYDROMORPHONE HCL 2 MG PO TABS
1.0000 mg | ORAL_TABLET | Freq: Four times a day (QID) | ORAL | 0 refills | Status: AC | PRN
  Filled 2023-04-08: qty 20, 10d supply, fill #0

## 2023-04-08 MED ORDER — LIDOCAINE-PRILOCAINE 2.5-2.5 % EX CREA
TOPICAL_CREAM | Freq: Once | CUTANEOUS | Status: AC
  Filled 2023-04-08: qty 5

## 2023-04-08 MED ORDER — POLYETHYLENE GLYCOL 3350 17 G PO PACK
17.0000 g | PACK | Freq: Every day | ORAL | Status: DC | PRN
  Administered 2023-04-08: 17 g via ORAL
  Filled 2023-04-08: qty 1

## 2023-04-08 MED ORDER — VANCOMYCIN HCL 1250 MG/250ML IV SOLN: 1250.0000 mg | INTRAVENOUS | Status: DC

## 2023-04-08 MED ORDER — CHLORHEXIDINE GLUCONATE CLOTH 2 % EX PADS
6.0000 | MEDICATED_PAD | Freq: Every day | CUTANEOUS | Status: DC
Start: 2023-04-08 — End: 2023-04-08
  Administered 2023-04-08: 6 via TOPICAL

## 2023-04-08 MED ORDER — HEPARIN SOD (PORK) LOCK FLUSH 100 UNIT/ML IV SOLN
500.0000 [IU] | INTRAVENOUS | Status: AC | PRN
  Administered 2023-04-08: 500 [IU]

## 2023-04-08 MED ORDER — SODIUM CHLORIDE 0.9% FLUSH: 10.0000 mL | INTRAVENOUS | Status: DC | PRN

## 2023-04-08 MED ORDER — ZOLEDRONIC ACID 4 MG/5ML IV CONC
3.5000 mg | Freq: Once | INTRAVENOUS | Status: AC
  Administered 2023-04-08: 3.5 mg via INTRAVENOUS
  Filled 2023-04-08: qty 4.38

## 2023-04-08 MED ORDER — DOCUSATE SODIUM 100 MG PO CAPS
100.0000 mg | ORAL_CAPSULE | Freq: Two times a day (BID) | ORAL | Status: DC | PRN
  Administered 2023-04-08: 100 mg via ORAL
  Filled 2023-04-08: qty 1

## 2023-04-08 NOTE — TOC Initial Note (Signed)
 Transition of Care [facility]) - Initial/Assessment Note    Patient Details  Name: Laura [name] MRN: [id number] Date of Birth: [birth year]  Transition [facility]) CM/SW Contact:    Lanier Clam, RN Phone Number: 04/08/2023, 11:10 AM  Clinical Narrative: d/c plan home. Noted PT eval await recc.                  Expected Discharge Plan: Home/Self Care Barriers to Discharge: Continued Medical Work up   Patient Goals and CMS Choice Patient states their goals for this hospitalization and ongoing recovery are:: Home CMS Medicare.gov Compare Post Acute Care list provided to:: Patient Choice offered to / list presented to : Patient [place] ownership interest in [facility].provided to:: Patient    Expected Discharge Plan and Services                                              Prior Living Arrangements/Services                       Activities of Daily Living   ADL Screening (condition at time of admission) Independently performs ADLs?: Yes (appropriate for developmental age) Is the patient deaf or have difficulty hearing?: No Does the patient have difficulty seeing, even when wearing glasses/contacts?: No Does the patient have difficulty concentrating, remembering, or making decisions?: Yes  Permission Sought/Granted                  Emotional Assessment              Admission diagnosis:  Other specified hypotension [I95.89] Dyspepsia [R10.13] Anxiety state [F41.1] Tachycardia [R00.0] Fever [R50.9] Metastatic melanoma (HCC) [C43.9] Fever, unspecified fever cause [R50.9] Pathological fracture of vertebra due to neoplastic disease, initial encounter [M84.58XA] Patient Active Problem List   Diagnosis Date Noted   Fever 04/07/2023   SIRS (systemic inflammatory response syndrome) (HCC) 04/07/2023   Acute low back pain 04/07/2023   Leukopenia 04/07/2023   Closed wedge compression fracture of T8 vertebra (HCC) 04/07/2023    Closed compression fracture of body of L1 vertebra (HCC) 04/07/2023   HLD (hyperlipidemia) 04/07/2023   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 04/07/2023   Anxiety state 04/07/2023   Dyspepsia 02/19/2023   Hypotension 02/02/2023   At risk for side effect of medication 02/01/2023   Nausea with vomiting 01/11/2023   Decreased oral intake 01/11/2023   Liver dysfunction 01/06/2023   Antineoplastic chemotherapy induced anemia 01/04/2023   Dehydration 01/04/2023   Decreased appetite 01/04/2023   Metastatic melanoma (HCC) 01/04/2023   Acute metabolic encephalopathy 01/04/2023   Tachycardia 01/03/2023   Acute encephalopathy 12/28/2022   Hypokalemia 12/28/2022   AKI (acute kidney injury) (HCC) 12/28/2022   Acute respiratory failure with hypoxia (HCC) 12/28/2022   Renal insufficiency 12/11/2022   Constipation 12/07/2022   Cancer associated pain 12/07/2022   Melanoma metastatic to liver (HCC) 12/04/2022   Gallstones 04/20/2018   Tobacco abuse 05/18/2014   Vertebral artery disease (HCC) 05/18/2014   Heel pain 06/12/2010   ACHILLES TENDINITIS 01/07/2010   DYSURIA 01/07/2010   MIGRAINE HEADACHE 11/14/2009   DISC DISEASE, CERVICAL 11/14/2009   PCP:  [name], [name] Pharmacy:   [facility] [address] [place] [address] Phone: [phone] Fax: [phone]  [facility], [place] -  [address] [place] [address] Phone: [phone] Fax: [phone]  [facility] [address] [place], [place] - [address] [facility] [address] [place] [address] Phone: [phone] Fax: [phone]     Social Drivers of Health (SDOH) Social History: SDOH Screenings   Food Insecurity: No Food Insecurity (04/07/2023)  Housing: Low Risk  (04/07/2023)  Transportation Needs: No Transportation Needs (04/07/2023)  Utilities: Not At Risk (04/07/2023)  Alcohol Screen:  Low Risk  (12/11/2022)  Depression (PHQ2-9): Low Risk  (12/11/2022)  Tobacco Use: Medium Risk (04/07/2023)   SDOH Interventions:     Readmission Risk Interventions     No data to display

## 2023-04-08 NOTE — Progress Notes (Signed)
 Discharge instructions reviewed with patient and spouse. All questions answered. All belongings accounted for. Patient to follow up with MD in  1 weeks. Patient medications hand delivered from outpatient pharmacy and inpatient pharamcy.  Assisted via WC to private vehicle.

## 2023-04-08 NOTE — Progress Notes (Signed)
 OT Cancellation Note  Patient Details Name: Laura [name] MRN: [id number] DOB: 20-Jun-1965   Cancelled Treatment:    Reason Eval/Treat Not Completed: OT screened, no needs identified, will sign off  OT screened for needs. Patient amb without assistance in room and reports independence with BADL's and mobility. Pain resolved. Has reacher for home use and husband with provide home support needs. Pt deferred OT eval at this time.    Savera Donson OT/L Acute Rehabilitation Department  [phone]   04/08/2023, 3:58 PM

## 2023-04-08 NOTE — Discharge Summary (Signed)
 Physician Discharge Summary  Laura [name] UJW:[id number] DOB: [birth year] DOA: 04/06/2023  PCP: [name], [name]  Admit date: 04/06/2023 Discharge date: 04/08/2023 Discharging to: home Recommendations for Outpatient Follow-up:  F/u on pain medications F/u  on WBC count  Consults:  oncology     Discharge Diagnoses:   Principal Problem:   SIRS (systemic inflammatory response syndrome) (HCC) Active Problems:   Cancer, metastatic to bone (HCC)   Metastatic melanoma (HCC)   Acute low back pain   Leukopenia   Closed wedge compression fracture of T8 vertebra (HCC)   Closed compression fracture of body of L1 vertebra (HCC)   HLD (hyperlipidemia)   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  Temperature noted to be 100.1 degree in the ED.  Heart rate in the 130s to 140s.  She has no complaints of cough, shortness of breath or flulike symptoms.  She admits to having chronic nausea.  No complaints of dysuria or new rash.  She also admits to a new pain in her left buttock area which feels like a "hot poker" and does not radiate down her leg. In the ED, the patient was felt to have sepsis and started on broad-spectrum antibiotics. Imaging also revealed interval development of  T8 and L1 vertebral body fractures.  Principal Problem:   SIRS (systemic inflammatory response syndrome)  -Low-grade fevers at home-tachycardia in the hospital - Blood cultures negative - Respiratory panel negative-no respiratory symptoms - UA negative  - ? If viral etiology   Active Problems: Nausea/vomiting - The patient has chronic nausea and feels it may be secondary to her "cancer medication" - improved with Phenergan   Hypokalemia - Secondary to above - Replaced     Metastatic melanoma -Holding binimetinib and encorafenib     Acute  left sacral pain New compression fractures in T8 and L1 - Etiology unclear but she does have numerous bone metastases which could be the cause -Continue fentanyl patch, gabapentin- pain improved with IV Dilaudid- have switched to oral dilaudid    Leukopenia - cause uncertain- may be a result of a possible viral infection - recommend outpt f/u for improvement        Body mass index is 19.5 kg/m. Nutrition Status:          Discharge Instructions   Allergies as of 04/08/2023       Reactions   Aspirin Swelling, Other (See Comments)   SWELLING REACTION UNSPECIFIED   Tylenol [acetaminophen] Swelling        Medication List     TAKE these medications    binimetinib 15 MG tablet Commonly known as: MEKTOVI Take 3 tablets (45 mg total) by mouth 2 (two) times daily.   docusate sodium 100 MG capsule Commonly known as: COLACE Take 100-200 mg by mouth daily.   dronabinol 10 MG capsule Commonly known as: MARINOL Take 1 capsule (10 mg total) by mouth 3 (three) times daily. What changed: when to take this   encorafenib 75 MG capsule Commonly known as: BRAFTOVI Take 6 capsules (450 mg total) by mouth daily.   famotidine 20 MG tablet Commonly known as: PEPCID Take 20 mg by mouth 2 (two) times daily.   fentaNYL 12 MCG/HR Commonly known as: DURAGESIC Place 1 patch onto the skin every 3 (three) days.   gabapentin 100 MG capsule  Commonly known as: NEURONTIN Take 1 capsule (100 mg total) by mouth 2 (two) times daily. What changed: when to take this   glycopyrrolate 1 MG tablet Commonly known as: Robinul Take 1 tablet (1 mg total) by mouth 2 (two) times daily as needed.   HYDROmorphone 2 MG tablet Commonly known as: Dilaudid Take 0.5 tablets (1 mg total) by mouth every 6 (six) hours as needed for up to 10 days for severe pain (pain score 7-10).   ibuprofen 600 MG tablet Commonly known as: ADVIL Take 1 tablet (600 mg total) by mouth every 6 (six) hours as needed for  fever, headache or mild pain (pain score 1-3).   lidocaine-prilocaine cream Commonly known as: EMLA Apply to port site 30-45 min prior to appointment.   OLANZapine 2.5 MG tablet Commonly known as: ZyPREXA Take 1 tablet (2.5 mg total) by mouth at bedtime.   ondansetron 8 MG disintegrating tablet Commonly known as: ZOFRAN-ODT Dissolve 1 tablet (8 mg total) by mouth every 8 (eight) hours as needed for nausea or vomiting.   potassium chloride 10 MEQ tablet Commonly known as: KLOR-CON M Take 2 tablets (20 mEq total) by mouth 2 (two) times daily.   promethazine 25 MG tablet Commonly known as: PHENERGAN Take 1 tablet (25 mg total) by mouth every 8 (eight) hours as needed for nausea or vomiting.   rosuvastatin 10 MG tablet Commonly known as: CRESTOR Take 10 mg by mouth at bedtime.   scopolamine 1 MG/3DAYS Commonly known as: TRANSDERM-SCOP Place 1 patch (1.5 mg total) onto the skin every 3 (three) days.   traMADol 50 MG tablet Commonly known as: ULTRAM Take 1-2 tablets (50-100 mg total) by mouth every 6 (six) hours as needed for moderate pain (pain score 4-6) or severe pain (pain score 7-10).   zolpidem 12.5 MG CR tablet Commonly known as: Ambien CR Take 1 tablet (12.5 mg total) by mouth at bedtime as needed for sleep.            The results of significant diagnostics from this hospitalization (including imaging, microbiology, ancillary and laboratory) are listed below for reference.    MR SACRUM SI JOINTS W WO CONTRAST Result Date: 04/08/2023 CLINICAL DATA:  Sacral pain.  Metastatic melanoma EXAM: MRI SACRUM WITHOUT AND WITH CONTRAST TECHNIQUE: Multiplanar and multiecho pulse sequences of the sacrum were obtained without and with intravenous contrast. CONTRAST:  4mL GADAVIST GADOBUTROL 1 MMOL/ML IV SOLN COMPARISON:  CT 04/06/2023 and 12/28/2022, PET-CT 02/15/2023, MRI 12/17/2022 FINDINGS: Bones/Joint/Cartilage Innumerable marrow replacing bone lesions throughout the sacrum,  pelvis, proximal femurs, and included lower lumbar spine. Lesions avidly enhance on postcontrast sequences. Chronic pathologic fracture of the L5 vertebral body. No additional pathologic fractures are identified. SI joints are intact without diastasis. Ligaments Intact. Muscles and Tendons Intramuscular edema within the bilateral iliacus and bilateral piriformis muscles. There is also intramuscular edema within the proximal adductor compartment musculature bilaterally. No acute tendinous abnormality within the field of view. Soft tissues No decubitus ulcer. No presacral mass. Trace presacral free fluid. No extraosseous soft tissue masses are identified. IMPRESSION: 1. Innumerable marrow replacing bone lesions throughout the sacrum, pelvis, proximal femurs, and included lower lumbar spine, compatible with known metastatic disease. 2. Chronic pathologic fracture of the L5 vertebral body. No additional pathologic fractures are identified. 3. Intramuscular edema within the bilateral iliacus and bilateral piriformis muscles. There is also intramuscular edema within the proximal adductor compartment musculature bilaterally. Findings are nonspecific and may be related to muscle strain or myositis.  Electronically Signed   By: Duanne Guess D.O.   On: 04/08/2023 09:54   MR THORACIC SPINE W WO CONTRAST Result Date: 04/07/2023 CLINICAL DATA:  T-spine pathologic fracture EXAM: MRI THORACIC WITHOUT AND WITH CONTRAST TECHNIQUE: Multiplanar and multiecho pulse sequences of the thoracic spine were obtained without and with intravenous contrast. CONTRAST:  4mL GADAVIST GADOBUTROL 1 MMOL/ML IV SOLN COMPARISON:  CTA chest 04/06/2023 FINDINGS: Alignment:  Physiologic. Vertebrae: There are compression fractures at T3, T4, T8 and L1, as demonstrated on the recent CT. No specific features to indicate acuity. There is heterogeneous contrast enhancement throughout the spinal column consistent with widespread osseous metastases. Cord:   Normal signal and morphology. Paraspinal and other soft tissues: Numerous hyperintense T2-weighted signal lesions within the liver. Small left pleural effusion. Disc levels: There is no spinal canal stenosis.  No nerve root impingement. IMPRESSION: 1. Widespread osseous metastases throughout the spinal column. 2. Compression fractures at T3, T4, T8 and L1, as demonstrated on the recent CT. No specific features to indicate acuity. 3. No spinal canal stenosis or nerve root impingement. 4. Small left pleural effusion. Electronically Signed   By: Deatra Robinson M.D.   On: 04/07/2023 22:54   MR BRAIN W WO CONTRAST Result Date: 04/07/2023 CLINICAL DATA:  Brain neoplasm, treatment response EXAM: MRI HEAD WITHOUT AND WITH CONTRAST TECHNIQUE: Multiplanar, multiecho pulse sequences of the brain and surrounding structures were obtained without and with intravenous contrast. CONTRAST:  4mL GADAVIST GADOBUTROL 1 MMOL/ML IV SOLN COMPARISON:  03/04/2023 FINDINGS: Brain: No acute infarct, mass effect or extra-axial collection. No acute or chronic hemorrhage. There is multifocal hyperintense T2-weighted signal within the white matter. Parenchymal volume and CSF spaces are normal. The midline structures are normal. Unchanged appearance of multiple punctate lesions: 1. Anterior right insula series 18, image 105 2. Anterior left frontal cortex, image 110 3. Right frontal lobe, image 120 4. Right frontal lobe, image 129 5. Left frontal lobe lesion has slightly decreased in size, image 152 Vascular: Normal flow voids. Skull and upper cervical spine: Numerous small enhancing lesions of the calvarium, unchanged. Sinuses/Orbits:No paranasal sinus fluid levels or advanced mucosal thickening. No mastoid or middle ear effusion. Normal orbits. IMPRESSION: 1. Unchanged or decreased size of multiple punctate metastases. No new lesions. 2. Unchanged appearance of numerous small lesions of the calvarium. Electronically Signed   By: Deatra Robinson  M.D.   On: 04/07/2023 22:23   CT Angio Chest PE W and/or Wo Contrast Result Date: 04/07/2023 CLINICAL DATA:  High probability pulmonary embolism, fever, chest congestion EXAM: CT ANGIOGRAPHY CHEST WITH CONTRAST TECHNIQUE: Multidetector CT imaging of the chest was performed using the standard protocol during bolus administration of intravenous contrast. Multiplanar CT image reconstructions and MIPs were obtained to evaluate the vascular anatomy. RADIATION DOSE REDUCTION: This exam was performed according to the departmental dose-optimization program which includes automated exposure control, adjustment of the mA and/or kV according to patient size and/or use of iterative reconstruction technique. CONTRAST:  75mL OMNIPAQUE IOHEXOL 350 MG/ML SOLN COMPARISON:  12/28/2022 FINDINGS: Cardiovascular: Adequate opacification of the pulmonary arterial tree. No intraluminal filling defect identified to suggest acute pulmonary embolism. Central pulmonary arteries are of normal caliber. No significant coronary artery calcification. There is thinning of the left ventricular apex suggesting prior myocardial infarction in this location. Mild left ventricular dilation. Global cardiac size within normal limits. Stable trace pericardial fluid. No pericardial effusion. Mild atherosclerotic calcification within the thoracic aorta. No aortic aneurysm. Right internal jugular chest port tip seen within  the superior right atrium. Mediastinum/Nodes: Previously noted pathologic mediastinal adenopathy has resolved. Visualized thyroid is unremarkable. Esophagus is unremarkable. Lungs/Pleura: Left upper lobe pulmonary nodule favored to represent a pulmonary metastasis has undergone interval cavitation decrease in size now measuring 10 mm in keeping with interval response to therapy. Mild emphysema. Mild bibasilar atelectasis. No pneumothorax or pleural effusion. Upper Abdomen: No acute abnormality. Musculoskeletal: Innumerable lytic lesions  are seen throughout the axial skeleton predominantly within the sternum and thoracolumbar spine, with development of pathologic fractures of the distal sternal body, T3 and T4 vertebral bodies with resultant accentuated focal thoracic kyphosis, T8 vertebral body with fracture clefts involving both the superior and inferior endplate and posterior convex bowing the vertebral body without significant canal stenosis. L1 vertebral body with multiple fracture planes involving the anterior and posterior vertebral bodies in keeping a burst fracture without associated retropulsion or canal stenosis. Resultant lumbar dextroscoliosis secondary to asymmetric left vertebral body height loss. Fracture anteroinferior aspect of the L2 vertebral body also noted. Despite this, there is increasing sclerosis of the axial skeleton and multiple largely lytic lesions with no soft tissue component on prior examination demonstrate interval decrease in size and/or resolution of the soft tissue component in keeping with response to therapy. Review of the MIP images confirms the above findings. IMPRESSION: 1. No pulmonary embolism. No acute intrathoracic pathology identified. 2. Interval response to therapy. Interval cavitation and decrease in size of the left upper lobe pulmonary nodule favored to represent a pulmonary metastasis. Previously noted pathologic mediastinal adenopathy has resolved. Extensive osseous metastatic disease demonstrates probable interval response to therapy with resolution of multiple metastatic soft tissue masses improvement in numerous lytic lesions. 3. Interval development of numerous pathologic fractures as outlined above. Surgical consultation for consideration of stabilization T8 and L1 fractures is recommended. Aortic Atherosclerosis (ICD10-I70.0) and Emphysema (ICD10-J43.9). Electronically Signed   By: Helyn Numbers M.D.   On: 04/07/2023 00:21   CT ABDOMEN PELVIS W CONTRAST Result Date: 04/07/2023 CLINICAL  DATA:  Sepsis fever x2 days with congestion. Pt denies SOB. Reports body aches and a "fire-poker" pain in tail bone. HR 150 in triage. Pt denies CP, dizziness. Metastatic melanoma. EXAM: CT ABDOMEN AND PELVIS WITH CONTRAST TECHNIQUE: Multidetector CT imaging of the abdomen and pelvis was performed using the standard protocol following bolus administration of intravenous contrast. RADIATION DOSE REDUCTION: This exam was performed according to the departmental dose-optimization program which includes automated exposure control, adjustment of the mA and/or kV according to patient size and/or use of iterative reconstruction technique. CONTRAST:  75mL OMNIPAQUE IOHEXOL 350 MG/ML SOLN COMPARISON:  PET CT 02/15/2023, CT angiography chest 04/06/2023, x-ray lumbar spine 03/10/2023 FINDINGS: Lower chest: Please see separately dictated CT angiography chest 04/06/2023. Port-A-Cath tip at the superior cavoatrial junction. Hepatobiliary: Redemonstration of scattered multiple hepatic hypodense lesions that appear smaller in size with as an example a left hepatic lobe lesion measuring 3.3 x 2.7 cm (from 7.5 x 4.8 cm). Status post cholecystectomy. No biliary dilatation. Pancreas: No focal lesion. Normal pancreatic contour. No surrounding inflammatory changes. No main pancreatic ductal dilatation. Spleen: Normal in size without focal abnormality.  Splenule noted. Adrenals/Urinary Tract: No adrenal nodule bilaterally. Bilateral kidneys enhance symmetrically. No hydronephrosis. No hydroureter. The urinary bladder is unremarkable. Stomach/Bowel: Stomach is within normal limits. No evidence of bowel wall thickening or dilatation. Appendix appears normal. Vascular/Lymphatic: No abdominal aorta or iliac aneurysm. Severe atherosclerotic plaque of the aorta and its branches. No abdominal, pelvic, or inguinal lymphadenopathy. Reproductive: Status post hysterectomy. No  adnexal masses. Other: No intraperitoneal free fluid. No intraperitoneal  free gas. No organized fluid collection. Musculoskeletal: No abdominal wall hernia or abnormality. Diffuse heterogeneous appearance of the axial and appendicular skeleton with underlying lytic metastatic lesions. No acute displaced fracture. Chronic L5 burst fracture with is at least 85% vertebral body height loss and 7 mm retropulsion into the central canal. Chronic L1 compression fracture with at least 70% vertebral body height loss. Chronic L2 vertebral body fracture anterior inferiorly. IMPRESSION: 1. No acute intra-abdominal or intrapelvic abnormality in a patient with metastatic disease. 2. Interval decrease in size of multiple hepatic metastases. 3. Axial and appendicular skeleton lytic metastatic lesions. 4. Chronic L1, L2, L5 fractures. 5.  Aortic Atherosclerosis (ICD10-I70.0). Electronically Signed   By: Tish Frederickson M.D.   On: 04/07/2023 00:08   DG Chest 2 View Result Date: 04/06/2023 CLINICAL DATA:  Fever for 2 days with congestion.  Body aches. EXAM: CHEST - 2 VIEW COMPARISON:  01/03/2023, 03/17/2022. FINDINGS: The heart size and mediastinal contours are within normal limits. No consolidation, effusion, or pneumothorax. A right-sided chest port terminates over the superior vena cava. Cervical spinal fusion hardware is noted. There is a compression deformity in the midthoracic spine with with loss of vertebral body height of 40%. No retropulsed element is seen. The previously described lytic lesions in the bones are not well demonstrated radiographically. IMPRESSION: 1. No active cardiopulmonary disease. 2. Compression deformity in the midthoracic spine, which is new from the previous exam and possible pathologic fracture given history of osseous metastasis. Electronically Signed   By: Thornell Sartorius M.D.   On: 04/06/2023 22:34   XR Lumbar Spine Complete Result Date: 03/10/2023 XRs of the lumbar spine from 03/10/2023 were independently reviewed and interpreted, showing scoliosis with apex to the  right at L1.  Fracture seen of the L5 vertebral with height loss that measures 52% when compared to the L4 vertebra.  Anterior height loss noted at the L1 vertebra consistent with fracture. No other fractures seen.  No evidence of instability on flexion/extension views.  Labs:   Basic Metabolic Panel: Recent Labs  Lab 04/06/23 2040 04/06/23 2336 04/07/23 0600 04/08/23 0540  NA 134*  --  131* 134*  K 3.7  --  3.6 3.5  CL 103  --  103 108  CO2 21*  --  23 22  GLUCOSE 103*  --  95 103*  BUN 8  --  8 9  CREATININE 1.15*  --  1.00 0.92  CALCIUM 7.9*  --  7.2* 7.3*  MG  --  1.8 1.9  --      CBC: Recent Labs  Lab 04/06/23 2040 04/07/23 0600  WBC 3.7* 2.6*  NEUTROABS 2.9 1.9  HGB 12.2 9.7*  HCT 36.5 29.2*  MCV 95.5 97.0  PLT 287 220         SIGNED:   Calvert Cantor, MD  [facility] 04/08/2023, 2:04 PM

## 2023-04-08 NOTE — Progress Notes (Signed)
 Pharmacy Antibiotic Note  Laura [name] is a 58 y.o. female admitted on 04/06/2023 with sepsis.  On 3/12, Pharmacy consulted for Cefepime + Vancomycin dosing.  Today, 04/08/23 Afebrile, WBC 2.6, and SCr down to 0.92 and est CrCl 53 ml/min   Plan: Continue Cefepime 2gm IV q12h Increase Vancomycin to 1250 mg IV q36h to target AUC 400-550.  Estimated AUC on this regimen = 496. Monitor clinical progress, renal function, vancomycin levels as indicated F/U C&S, abx deescalation / LOT    Height: 5\' 2"  (157.5 cm) Weight: 48.4 kg (106 lb 9.8 oz) IBW/kg (Calculated) : 50.1  Temp (24hrs), Avg:98.1 F (36.7 C), Min:97.6 F (36.4 C), Max:98.8 F (37.1 C)  Recent Labs  Lab 04/01/23 0927 04/06/23 2040 04/06/23 2052 04/06/23 2224 04/07/23 0600 04/08/23 0540  WBC 4.4 3.7*  --   --  2.6*  --   CREATININE 1.18* 1.15*  --   --  1.00 0.92  LATICACIDVEN  --   --  1.3 1.3  --   --     Estimated Creatinine Clearance: 52.8 mL/min (by C-G formula based on SCr of 0.92 mg/dL).    Allergies  Allergen Reactions   Aspirin Swelling and Other (See Comments)    SWELLING REACTION UNSPECIFIED   Tylenol [Acetaminophen] Swelling    Antimicrobials this admission: 3/11 Cefepime >>  3/11 Vancomycin >>  3/11 Metronidazole >>  Dose adjustments this admission: 3/13 increase vancomycin from 1000 mg to 1250 mg IV q36h Microbiology results: 3/11 BCx: sent 3/11 Resp PCR: negative  Thank you for allowing pharmacy to be a part of this patient's care.  Selinda Eon, PharmD, BCPS Clinical Pharmacist [place] 04/08/2023 9:22 AM

## 2023-04-08 NOTE — Progress Notes (Signed)
 PT Cancellation Note/ Screen  Patient Details Name: [name] MRN: [id number] DOB: 04-May-1965   Cancelled Treatment:    Reason Eval/Treat Not Completed: PT screened, no needs identified, will sign off Pt reports being up independently to bathroom earlier.  She denies issues with mobility and currently not having pain.  Pt politely declined need for PT and reports husband has already purchased some DME for home.  Pt reports d/c home today.  PT to sign off.   Janan Halter [place] 04/08/2023, 2:09 PM Paulino Door, DPT Physical Therapist [facility] Services Office: [phone]

## 2023-04-08 NOTE — Progress Notes (Addendum)
 Laura [name]   DOB:August 08, 1965   ON#:[id number]    ASSESSMENT & PLAN:  58 y.o. w with known metastatic melanoma with liver, brain and bone metastases presented with fever, pain the buttock found new T spine fracture. Clinically pain mainly in the buttock. ROS found visual change with persistent blurred vision, nausea difficulty to control.   Pain improved. MRI showed multiple fractures and bone metastases. No thoracic level pain. Buttock pain improved and intermittent. No surgical intervention needed.   MRI of brain showed stable disease. CT showed response in the liver metatases.   She is able to ambulate with nurse today. No other new symptoms.  Report some constipation. No GU symptoms. No focal weakness in the lower extremities.  Nausea improved on phenergan.  Bone metastases from Melanoma Will continue oral MEKTOVI and BRAFTOVI. Calcium/vit D daily Zometa renal dosing  Nausea/vomtiing Prn phenergan Script of phenergan sent to [facility] pharmacy. Continue Pepcid twice daily  Constipation Bowel regimen ordered  Discussed with patient and husband.  All questions were answered.   Patient would like to go home if feeling well after oral intake this afternoon.  Thank you for the consult. Will follow up as outpatient.  Melven Sartorius, MD 04/08/2023 1:36 PM  Subjective:  Laura [name] reports feeling better. Report no back pain, and pain in the buttock seems improved. No focal weakness. Urine output adequate. She has constipation. Nurse reports ambulating to bathroom without issues. Nausea improved with phenergan. No n/v this morning after oral medications. She has not noticed new visual changes this morning.   Objective:  Vitals:   04/07/23 1926 04/08/23 0417  BP: 123/81 109/68  Pulse: (!) 115 (!) 101  Resp: 18 20  Temp: 98 F (36.7 C) 97.6 F (36.4 C)  SpO2: 100% 98%     Intake/Output Summary (Last 24 hours) at 04/08/2023 1336 Last data filed at 04/08/2023 0830 Gross per 24  hour  Intake 1833.22 ml  Output --  Net 1833.22 ml    GENERAL: alert, no distress and comfortable SKIN: skin color normal. No bed sores EYES: normal, sclera clear LUNGS: No wheeze, rales and clear to auscultation bilaterally with normal breathing effort.  HEART: regular rate & rhythm  ABDOMEN: abdomen soft, non-tender and non-distended Musculoskeletal: no lower extremity edema NEURO: alert with fluent speech, no focal motor/sensory deficits. Lower leg strength 5/5   Labs:  Recent Labs    04/06/23 2040 04/06/23 2224 04/07/23 0600 04/08/23 0540  NA 134*  --  131* 134*  K 3.7  --  3.6 3.5  CL 103  --  103 108  CO2 21*  --  23 22  GLUCOSE 103*  --  95 103*  BUN 8  --  8 9  CREATININE 1.15*  --  1.00 0.92  CALCIUM 7.9*  --  7.2* 7.3*  GFRNONAA 56*  --  >60 >60  PROT  --  4.3* 4.2* 3.5*  ALBUMIN  --  2.0* 2.0* 1.6*  AST  --  46* 33 21  ALT  --  14 13 11   ALKPHOS  --  113 112 88  BILITOT  --  0.6 0.5 0.6  BILIDIR  --  0.2  --   --   IBILI  --  0.4  --   --     Studies:  MR SACRUM SI JOINTS W WO CONTRAST Result Date: 04/08/2023 CLINICAL DATA:  Sacral pain.  Metastatic melanoma EXAM: MRI SACRUM WITHOUT AND WITH CONTRAST TECHNIQUE: Multiplanar and multiecho pulse  sequences of the sacrum were obtained without and with intravenous contrast. CONTRAST:  4mL GADAVIST GADOBUTROL 1 MMOL/ML IV SOLN COMPARISON:  CT 04/06/2023 and 12/28/2022, PET-CT 02/15/2023, MRI 12/17/2022 FINDINGS: Bones/Joint/Cartilage Innumerable marrow replacing bone lesions throughout the sacrum, pelvis, proximal femurs, and included lower lumbar spine. Lesions avidly enhance on postcontrast sequences. Chronic pathologic fracture of the L5 vertebral body. No additional pathologic fractures are identified. SI joints are intact without diastasis. Ligaments Intact. Muscles and Tendons Intramuscular edema within the bilateral iliacus and bilateral piriformis muscles. There is also intramuscular edema within the proximal  adductor compartment musculature bilaterally. No acute tendinous abnormality within the field of view. Soft tissues No decubitus ulcer. No presacral mass. Trace presacral free fluid. No extraosseous soft tissue masses are identified. IMPRESSION: 1. Innumerable marrow replacing bone lesions throughout the sacrum, pelvis, proximal femurs, and included lower lumbar spine, compatible with known metastatic disease. 2. Chronic pathologic fracture of the L5 vertebral body. No additional pathologic fractures are identified. 3. Intramuscular edema within the bilateral iliacus and bilateral piriformis muscles. There is also intramuscular edema within the proximal adductor compartment musculature bilaterally. Findings are nonspecific and may be related to muscle strain or myositis. Electronically Signed   By: Duanne Guess D.O.   On: 04/08/2023 09:54   MR THORACIC SPINE W WO CONTRAST Result Date: 04/07/2023 CLINICAL DATA:  T-spine pathologic fracture EXAM: MRI THORACIC WITHOUT AND WITH CONTRAST TECHNIQUE: Multiplanar and multiecho pulse sequences of the thoracic spine were obtained without and with intravenous contrast. CONTRAST:  4mL GADAVIST GADOBUTROL 1 MMOL/ML IV SOLN COMPARISON:  CTA chest 04/06/2023 FINDINGS: Alignment:  Physiologic. Vertebrae: There are compression fractures at T3, T4, T8 and L1, as demonstrated on the recent CT. No specific features to indicate acuity. There is heterogeneous contrast enhancement throughout the spinal column consistent with widespread osseous metastases. Cord:  Normal signal and morphology. Paraspinal and other soft tissues: Numerous hyperintense T2-weighted signal lesions within the liver. Small left pleural effusion. Disc levels: There is no spinal canal stenosis.  No nerve root impingement. IMPRESSION: 1. Widespread osseous metastases throughout the spinal column. 2. Compression fractures at T3, T4, T8 and L1, as demonstrated on the recent CT. No specific features to indicate  acuity. 3. No spinal canal stenosis or nerve root impingement. 4. Small left pleural effusion. Electronically Signed   By: Deatra Robinson M.D.   On: 04/07/2023 22:54   MR BRAIN W WO CONTRAST Result Date: 04/07/2023 CLINICAL DATA:  Brain neoplasm, treatment response EXAM: MRI HEAD WITHOUT AND WITH CONTRAST TECHNIQUE: Multiplanar, multiecho pulse sequences of the brain and surrounding structures were obtained without and with intravenous contrast. CONTRAST:  4mL GADAVIST GADOBUTROL 1 MMOL/ML IV SOLN COMPARISON:  03/04/2023 FINDINGS: Brain: No acute infarct, mass effect or extra-axial collection. No acute or chronic hemorrhage. There is multifocal hyperintense T2-weighted signal within the white matter. Parenchymal volume and CSF spaces are normal. The midline structures are normal. Unchanged appearance of multiple punctate lesions: 1. Anterior right insula series 18, image 105 2. Anterior left frontal cortex, image 110 3. Right frontal lobe, image 120 4. Right frontal lobe, image 129 5. Left frontal lobe lesion has slightly decreased in size, image 152 Vascular: Normal flow voids. Skull and upper cervical spine: Numerous small enhancing lesions of the calvarium, unchanged. Sinuses/Orbits:No paranasal sinus fluid levels or advanced mucosal thickening. No mastoid or middle ear effusion. Normal orbits. IMPRESSION: 1. Unchanged or decreased size of multiple punctate metastases. No new lesions. 2. Unchanged appearance of numerous small lesions of the  calvarium. Electronically Signed   By: Deatra Robinson M.D.   On: 04/07/2023 22:23   CT Angio Chest PE W and/or Wo Contrast Result Date: 04/07/2023 CLINICAL DATA:  High probability pulmonary embolism, fever, chest congestion EXAM: CT ANGIOGRAPHY CHEST WITH CONTRAST TECHNIQUE: Multidetector CT imaging of the chest was performed using the standard protocol during bolus administration of intravenous contrast. Multiplanar CT image reconstructions and MIPs were obtained to  evaluate the vascular anatomy. RADIATION DOSE REDUCTION: This exam was performed according to the departmental dose-optimization program which includes automated exposure control, adjustment of the mA and/or kV according to patient size and/or use of iterative reconstruction technique. CONTRAST:  75mL OMNIPAQUE IOHEXOL 350 MG/ML SOLN COMPARISON:  12/28/2022 FINDINGS: Cardiovascular: Adequate opacification of the pulmonary arterial tree. No intraluminal filling defect identified to suggest acute pulmonary embolism. Central pulmonary arteries are of normal caliber. No significant coronary artery calcification. There is thinning of the left ventricular apex suggesting prior myocardial infarction in this location. Mild left ventricular dilation. Global cardiac size within normal limits. Stable trace pericardial fluid. No pericardial effusion. Mild atherosclerotic calcification within the thoracic aorta. No aortic aneurysm. Right internal jugular chest port tip seen within the superior right atrium. Mediastinum/Nodes: Previously noted pathologic mediastinal adenopathy has resolved. Visualized thyroid is unremarkable. Esophagus is unremarkable. Lungs/Pleura: Left upper lobe pulmonary nodule favored to represent a pulmonary metastasis has undergone interval cavitation decrease in size now measuring 10 mm in keeping with interval response to therapy. Mild emphysema. Mild bibasilar atelectasis. No pneumothorax or pleural effusion. Upper Abdomen: No acute abnormality. Musculoskeletal: Innumerable lytic lesions are seen throughout the axial skeleton predominantly within the sternum and thoracolumbar spine, with development of pathologic fractures of the distal sternal body, T3 and T4 vertebral bodies with resultant accentuated focal thoracic kyphosis, T8 vertebral body with fracture clefts involving both the superior and inferior endplate and posterior convex bowing the vertebral body without significant canal stenosis. L1  vertebral body with multiple fracture planes involving the anterior and posterior vertebral bodies in keeping a burst fracture without associated retropulsion or canal stenosis. Resultant lumbar dextroscoliosis secondary to asymmetric left vertebral body height loss. Fracture anteroinferior aspect of the L2 vertebral body also noted. Despite this, there is increasing sclerosis of the axial skeleton and multiple largely lytic lesions with no soft tissue component on prior examination demonstrate interval decrease in size and/or resolution of the soft tissue component in keeping with response to therapy. Review of the MIP images confirms the above findings. IMPRESSION: 1. No pulmonary embolism. No acute intrathoracic pathology identified. 2. Interval response to therapy. Interval cavitation and decrease in size of the left upper lobe pulmonary nodule favored to represent a pulmonary metastasis. Previously noted pathologic mediastinal adenopathy has resolved. Extensive osseous metastatic disease demonstrates probable interval response to therapy with resolution of multiple metastatic soft tissue masses improvement in numerous lytic lesions. 3. Interval development of numerous pathologic fractures as outlined above. Surgical consultation for consideration of stabilization T8 and L1 fractures is recommended. Aortic Atherosclerosis (ICD10-I70.0) and Emphysema (ICD10-J43.9). Electronically Signed   By: Helyn Numbers M.D.   On: 04/07/2023 00:21   CT ABDOMEN PELVIS W CONTRAST Result Date: 04/07/2023 CLINICAL DATA:  Sepsis fever x2 days with congestion. Pt denies SOB. Reports body aches and a "fire-poker" pain in tail bone. HR 150 in triage. Pt denies CP, dizziness. Metastatic melanoma. EXAM: CT ABDOMEN AND PELVIS WITH CONTRAST TECHNIQUE: Multidetector CT imaging of the abdomen and pelvis was performed using the standard protocol following bolus administration of  intravenous contrast. RADIATION DOSE REDUCTION: This exam  was performed according to the departmental dose-optimization program which includes automated exposure control, adjustment of the mA and/or kV according to patient size and/or use of iterative reconstruction technique. CONTRAST:  75mL OMNIPAQUE IOHEXOL 350 MG/ML SOLN COMPARISON:  PET CT 02/15/2023, CT angiography chest 04/06/2023, x-ray lumbar spine 03/10/2023 FINDINGS: Lower chest: Please see separately dictated CT angiography chest 04/06/2023. Port-A-Cath tip at the superior cavoatrial junction. Hepatobiliary: Redemonstration of scattered multiple hepatic hypodense lesions that appear smaller in size with as an example a left hepatic lobe lesion measuring 3.3 x 2.7 cm (from 7.5 x 4.8 cm). Status post cholecystectomy. No biliary dilatation. Pancreas: No focal lesion. Normal pancreatic contour. No surrounding inflammatory changes. No main pancreatic ductal dilatation. Spleen: Normal in size without focal abnormality.  Splenule noted. Adrenals/Urinary Tract: No adrenal nodule bilaterally. Bilateral kidneys enhance symmetrically. No hydronephrosis. No hydroureter. The urinary bladder is unremarkable. Stomach/Bowel: Stomach is within normal limits. No evidence of bowel wall thickening or dilatation. Appendix appears normal. Vascular/Lymphatic: No abdominal aorta or iliac aneurysm. Severe atherosclerotic plaque of the aorta and its branches. No abdominal, pelvic, or inguinal lymphadenopathy. Reproductive: Status post hysterectomy. No adnexal masses. Other: No intraperitoneal free fluid. No intraperitoneal free gas. No organized fluid collection. Musculoskeletal: No abdominal wall hernia or abnormality. Diffuse heterogeneous appearance of the axial and appendicular skeleton with underlying lytic metastatic lesions. No acute displaced fracture. Chronic L5 burst fracture with is at least 85% vertebral body height loss and 7 mm retropulsion into the central canal. Chronic L1 compression fracture with at least 70% vertebral  body height loss. Chronic L2 vertebral body fracture anterior inferiorly. IMPRESSION: 1. No acute intra-abdominal or intrapelvic abnormality in a patient with metastatic disease. 2. Interval decrease in size of multiple hepatic metastases. 3. Axial and appendicular skeleton lytic metastatic lesions. 4. Chronic L1, L2, L5 fractures. 5.  Aortic Atherosclerosis (ICD10-I70.0). Electronically Signed   By: Tish Frederickson M.D.   On: 04/07/2023 00:08   DG Chest 2 View Result Date: 04/06/2023 CLINICAL DATA:  Fever for 2 days with congestion.  Body aches. EXAM: CHEST - 2 VIEW COMPARISON:  01/03/2023, 03/17/2022. FINDINGS: The heart size and mediastinal contours are within normal limits. No consolidation, effusion, or pneumothorax. A right-sided chest port terminates over the superior vena cava. Cervical spinal fusion hardware is noted. There is a compression deformity in the midthoracic spine with with loss of vertebral body height of 40%. No retropulsed element is seen. The previously described lytic lesions in the bones are not well demonstrated radiographically. IMPRESSION: 1. No active cardiopulmonary disease. 2. Compression deformity in the midthoracic spine, which is new from the previous exam and possible pathologic fracture given history of osseous metastasis. Electronically Signed   By: Thornell Sartorius M.D.   On: 04/06/2023 22:34   XR Lumbar Spine Complete Result Date: 03/10/2023 XRs of the lumbar spine from 03/10/2023 were independently reviewed and interpreted, showing scoliosis with apex to the right at L1.  Fracture seen of the L5 vertebral with height loss that measures 52% when compared to the L4 vertebra.  Anterior height loss noted at the L1 vertebra consistent with fracture. No other fractures seen.  No evidence of instability on flexion/extension views.

## 2023-04-08 NOTE — Progress Notes (Signed)
 Patient ID: Laura [name], female   DOB: [birth year], 58 y.o.   MRN: [id number] Mri reviewed. The thoracic spine is littered with metastatic disease. I do not believe there is a role for instrumentation for [name]. Can offer a kyphoplasty but utility is dubious. Will speak with patient later today.

## 2023-04-09 ENCOUNTER — Other Ambulatory Visit: Payer: Self-pay

## 2023-04-09 ENCOUNTER — Other Ambulatory Visit (HOSPITAL_COMMUNITY): Payer: Self-pay

## 2023-04-09 MED ORDER — DRONABINOL 10 MG PO CAPS
10.0000 mg | ORAL_CAPSULE | Freq: Three times a day (TID) | ORAL | 0 refills | Status: DC
  Filled 2023-04-09: qty 90, 30d supply, fill #0

## 2023-04-09 MED ORDER — GABAPENTIN 100 MG PO CAPS
100.0000 mg | ORAL_CAPSULE | Freq: Two times a day (BID) | ORAL | 0 refills | Status: DC
  Filled 2023-04-09: qty 60, 30d supply, fill #0

## 2023-04-12 ENCOUNTER — Other Ambulatory Visit: Payer: Self-pay | Admitting: Nurse Practitioner

## 2023-04-12 DIAGNOSIS — C7931 Secondary malignant neoplasm of brain: Secondary | ICD-10-CM

## 2023-04-12 DIAGNOSIS — Z515 Encounter for palliative care: Secondary | ICD-10-CM

## 2023-04-12 DIAGNOSIS — G893 Neoplasm related pain (acute) (chronic): Secondary | ICD-10-CM

## 2023-04-12 DIAGNOSIS — C7951 Secondary malignant neoplasm of bone: Secondary | ICD-10-CM

## 2023-04-12 LAB — CULTURE, BLOOD (ROUTINE X 2)
Culture: NO GROWTH
Culture: NO GROWTH
Special Requests: ADEQUATE

## 2023-04-13 ENCOUNTER — Other Ambulatory Visit: Payer: Self-pay

## 2023-04-13 ENCOUNTER — Other Ambulatory Visit (HOSPITAL_COMMUNITY): Payer: Self-pay

## 2023-04-13 ENCOUNTER — Other Ambulatory Visit: Payer: BC Managed Care – PPO

## 2023-04-13 ENCOUNTER — Ambulatory Visit: Payer: BC Managed Care – PPO

## 2023-04-13 MED ORDER — FENTANYL 12 MCG/HR TD PT72
1.0000 | MEDICATED_PATCH | TRANSDERMAL | 0 refills | Status: DC
  Filled 2023-04-13: qty 10, 30d supply, fill #0

## 2023-04-13 NOTE — Progress Notes (Unsigned)
 [name] Care Team: [name], [name] as PCP - General (Family Medicine) Pickenpack-Cousar, Arty Baumgartner, NP as Nurse Practitioner [facility])  Clinic Day:  04/15/2023  Referring physician: Melven Sartorius, MD  ASSESSMENT & PLAN:   Assessment & Plan: Diagnosis: 11/25/2022. Diffuse liver, bone, lung metastases. Brain metastases after starting systemic treatment Treatment: 12/11/2022. First line ipi/nivo. Completed 2 cycles. 12/14/22-12/28/22 radiation to L-spine 30Gy/56fx 12/14  vemurafenib and cobimetinib due to disease uncontrolled or worsening symptoms, liver metastases.  Clinically stable with liver, bone, brain metastases.  We discussed clinical progress today.  Laura [name] has improvement clinically.  Will continue current management.  Report any new side effects and new symptoms.  Melanoma metastatic to liver [facility]) LFT improved. PET showed response. Continue monitor. encorafenib  450 mg once daily with binimetinib 45 mg twice daily. PET scan about end of March, ordered today CBC, CMP, LDH on  with follow up Continue nivolumab  Folate deficiency anemia Folic acid 1 mg daily  Cancer, metastatic to bone (HCC) Continue Mektovi and Braftovi Laura [name] has no new symptoms.  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 Not need phenergan Continue pepcid.    Laura [name] understands Laura plans discussed today and is in agreement with them.  Laura [name] knows to contact our office if Laura [name] develops concerns prior to Laura [name] next appointment.  Melven Sartorius, MD  [facility] - A DEPT OF [facility] [address] [place] [address] Dept: [phone] Dept Fax: [phone]   Orders Placed This Encounter  Procedures   Lactate dehydrogenase    Standing Status:   Future    Expected  Date:   06/10/2023    Expiration Date:   06/09/2024   CBC with Differential (Cancer Center Only)    Standing Status:   Future    Expected Date:   06/10/2023    Expiration Date:   06/09/2024   CMP (Cancer Center only)    Standing Status:   Future    Expected Date:   06/10/2023    Expiration Date:   06/09/2024   TSH    Standing Status:   Future    Expected Date:   06/10/2023    Expiration Date:   06/09/2024   T4, free    Standing Status:   Future    Expected Date:   06/10/2023    Expiration Date:   06/09/2024   Lactate dehydrogenase    Standing Status:   Future    Expected Date:   06/24/2023    Expiration Date:   06/23/2024   CBC with Differential (Cancer Center Only)    Standing Status:   Future    Expected Date:   06/24/2023    Expiration Date:   06/23/2024   CMP (Cancer Center only)    Standing Status:   Future    Expected Date:   06/24/2023    Expiration Date:   06/23/2024   TSH    Standing Status:   Future    Expected Date:   06/24/2023    Expiration Date:   06/23/2024   T4, free    Standing Status:   Future    Expected Date:   06/24/2023    Expiration Date:   06/23/2024      CHIEF COMPLAINT:  CC: melanoma  Current Treatment: Mektovi, Braftovi, nivolumab  INTERVAL HISTORY:  Laura [name] is here today for repeat clinical assessment. Laura [name] denies fevers or chills.  Reports previous pain in Laura buttock has improved until yesterday when Laura [name] did feel much going up and down and walking.  Laura [name] needed ibuprofen just once a day.  Laura [name] denies any bone pain, back pain or new hip pain.  No weakness in Laura leg and has been walking around more.  Overall Laura [name] is feeling better and also has improved Laura [name] oral intake including fluids and solid food.  Laura [name] is trying not to use wheelchair much.  1 headache resolved.  No persistent headaches.  Blurry vision improved.  No new visual change.  Nausea has been controlled better.  He has not needed Phenergan.  Zofran and Compazine as needed has been working along with  scopolamine patch.  Laura [name] is taking Laura [name] Pepcid.  Laura [name] denies any coughing, shortness of breath, abdominal pain.  No diarrhea.  I have reviewed Laura past medical history, past surgical history, social history and family history with Laura [name] and they are unchanged from previous note.  ALLERGIES:  is allergic to aspirin and tylenol [acetaminophen].  MEDICATIONS:  Current Outpatient Medications  Medication Sig Dispense Refill   binimetinib (MEKTOVI) 15 MG tablet Take 3 tablets (45 mg total) by mouth 2 (two) times daily. 180 tablet 11   docusate sodium (COLACE) 100 MG capsule Take 100-200 mg by mouth daily.     dronabinol (MARINOL) 10 MG capsule Take 1 capsule (10 mg total) by mouth 3 (three) times daily. 90 capsule 0   encorafenib (BRAFTOVI) 75 MG capsule Take 6 capsules (450 mg total) by mouth daily. 180 capsule 11   famotidine (PEPCID) 20 MG tablet Take 20 mg by mouth 2 (two) times daily.     fentaNYL (DURAGESIC) 12 MCG/HR Place 1 patch onto Laura skin every 3 (three) days. 10 patch 0   gabapentin (NEURONTIN) 100 MG capsule Take 1 capsule (100 mg total) by mouth 2 (two) times daily. 60 capsule 0   glycopyrrolate (ROBINUL) 1 MG tablet Take 1 tablet (1 mg total) by mouth 2 (two) times daily as needed. 30 tablet 2   HYDROmorphone (DILAUDID) 2 MG tablet Take 0.5 tablets (1 mg total) by mouth every 6 (six) hours as needed for up to 10 days for severe pain (pain score 7-10). 20 tablet 0   hydrOXYzine (ATARAX) 25 MG tablet Take 25 mg by mouth daily.     ibuprofen (ADVIL) 600 MG tablet Take 1 tablet (600 mg total) by mouth every 6 (six) hours as needed for fever, headache or mild pain (pain score 1-3). 30 tablet 0   lidocaine-prilocaine (EMLA) cream Apply to port site 30-45 min prior to appointment. 30 g 6   OLANZapine (ZYPREXA) 2.5 MG tablet Take 1 tablet (2.5 mg total) by mouth at bedtime. 30 tablet 1   ondansetron (ZOFRAN-ODT) 8 MG disintegrating tablet Dissolve 1 tablet (8 mg total) by mouth every 8  (eight) hours as needed for nausea or vomiting. 60 tablet 2   potassium chloride (KLOR-CON M) 10 MEQ tablet Take 2 tablets (20 mEq total) by mouth 2 (two) times daily. 120 tablet 1   promethazine (PHENERGAN) 25 MG tablet Take 1 tablet (25 mg total) by mouth every 8 (eight) hours as needed for nausea or vomiting. 60 tablet 0   rosuvastatin (CRESTOR) 10 MG tablet Take 10 mg by mouth at bedtime.     scopolamine (TRANSDERM-SCOP) 1 MG/3DAYS Place 1 patch (1.5 mg total) onto Laura  skin every 3 (three) days. 10 patch 12   traMADol (ULTRAM) 50 MG tablet Take 1-2 tablets (50-100 mg total) by mouth every 6 (six) hours as needed for moderate pain (pain score 4-6) or severe pain (pain score 7-10). 90 tablet 0   zolpidem (AMBIEN CR) 12.5 MG CR tablet Take 1 tablet (12.5 mg total) by mouth at bedtime as needed for sleep. 30 tablet 2   No current facility-administered medications for this visit.    HISTORY OF PRESENT ILLNESS:   Oncology History  Melanoma metastatic to liver (HCC)  11/16/2022 Imaging   Laura [name] presented to Laura emergency room on 11/16/2022 for back pain x 2 weeks and central chest pain for several months   CTA CAP IMPRESSION: 1. Negative for aortic dissection or aneurysm. 2. 1.2 cm left upper lobe nodule.  For 3. Multiple ill-defined liver lesions, suspicious for metastatic disease. 4. Multiple lytic osseous lesions, suspicious for metastatic disease. Pathologic fracture of Laura superior endplate of L5   11/25/2022 Pathology Results   1. Liver, needle/core biopsy, Left hepatic lobe lesion :       - METASTATIC MELANOMA.    12/04/2022 Initial Diagnosis   Melanoma of skin (HCC) Liver, bone metastases. 1.2 cm LUL nodule   12/07/2022 Cancer Staging   Staging form: Melanoma of Laura Skin, AJCC 8th Edition - Clinical: Stage IV (cTX, cN1, pM1d(0)) - Signed by Melven Sartorius, MD on 03/01/2023   12/09/2022 PET scan   PET: Diffuse bone and liver metastases.  Lung metastases.    12/11/2022 -   Chemotherapy   [name] is on Treatment Plan : MELANOMA Nivolumab (1) + Ipilimumab (3) q21d / Nivolumab (480) q28d     12/17/2022 Imaging   MRI brain 1. Three small right hemisphere brain metastases metastases ranging from 2 mm to 4 mm. Minimal hemosiderin. No associated cerebral edema or mass effect. No other metastatic disease identified in Laura brain.   2. Known osseous metastatic disease but no destructive lesion identified in Laura skull or visible cervical spine.   3. Moderate for age cerebral white matter changes most commonly due to small vessel disease.   12/17/2022 Imaging   MRI brain 1. Three small right hemisphere brain metastases metastases ranging from 2 mm to 4 mm. Minimal hemosiderin. No associated cerebral edema or mass effect. No other metastatic disease identified in Laura brain.   2. Known osseous metastatic disease but no destructive lesion identified in Laura skull or visible cervical spine.   3. Moderate for age cerebral white matter changes most commonly due to small vessel disease.   02/15/2023 PET scan   PET whole body  1. Laura degree metastatic disease defined on recent CT scan is less impressive on FDG PET scan. 2. There is intense hypermetabolic lesion in Laura lateral segment of Laura LEFT hepatic lobe which corresponds to a large 3.7 cm mass. Additional small hepatic lesions have mild metabolic activity. 3. Very low metabolic activity associated with a LEFT upper lobe pulmonary nodule which is decreased in size. Favor positive response to therapy. 4. Small LEFT hilar hypermetabolic node is indeterminate. 5. Widespread lytic lesions throughout Laura axillary appendicular skeleton. Majority these lesions do not have hypermetabolic activity. One soft tissue lesion in Laura LEFT sacral ala does have mild metabolic activity.       REVIEW OF SYSTEMS:   All relevant systems were reviewed with Laura [name] and are negative.   VITALS:  Blood pressure 117/75, pulse  (!) 112, temperature 97.7 F (36.5  C), temperature source Temporal, resp. rate 17, weight 105 lb 8 oz (47.9 kg), SpO2 98%.  Wt Readings from Last 3 Encounters:  04/15/23 105 lb 8 oz (47.9 kg)  04/08/23 106 lb 9.8 oz (48.4 kg)  04/01/23 101 lb 9.6 oz (46.1 kg)    Body mass index is 19.3 kg/m.  Performance status (ECOG): 2 - Symptomatic, <50% confined to bed  PHYSICAL EXAM:   GENERAL:alert, no distress and comfortable SKIN: skin color normal, no rashes and no jaundice EYES: normal, sclera clear OROPHARYNX: no exudate, no erythema    NECK: supple,  non-tender, without erythema around Laura port LUNGS: clear to auscultation with normal breathing effort.  No wheeze or rales HEART: Tachycardic ABDOMEN: abdomen soft, non-tender and nondistended Musculoskeletal: No ankle edema NEURO: alert, fluent speech, no focal motor/sensory deficits.  Strength and sensation equal bilaterally.  LABORATORY DATA:  I have reviewed Laura data as listed    Component Value Date/Time   NA 138 04/15/2023 1038   K 4.6 04/15/2023 1038   CL 111 04/15/2023 1038   CO2 25 04/15/2023 1038   GLUCOSE 90 04/15/2023 1038   BUN 9 04/15/2023 1038   CREATININE 0.73 04/15/2023 1038   CALCIUM 7.3 (L) 04/15/2023 1038   PROT 4.5 (L) 04/15/2023 1038   ALBUMIN 2.4 (L) 04/15/2023 1038   AST 19 04/15/2023 1038   ALT 9 04/15/2023 1038   ALKPHOS 117 04/15/2023 1038   BILITOT 0.4 04/15/2023 1038   GFRNONAA >60 04/15/2023 1038   GFRAA >60 04/20/2018 0823    No results found for: "SPEP", "UPEP"  Lab Results  Component Value Date   WBC 3.6 (L) 04/15/2023   NEUTROABS 1.9 04/15/2023   HGB 8.7 (L) 04/15/2023   HCT 26.6 (L) 04/15/2023   MCV 95.3 04/15/2023   PLT 362 04/15/2023      Chemistry      Component Value Date/Time   NA 138 04/15/2023 1038   K 4.6 04/15/2023 1038   CL 111 04/15/2023 1038   CO2 25 04/15/2023 1038   BUN 9 04/15/2023 1038   CREATININE 0.73 04/15/2023 1038      Component Value Date/Time    CALCIUM 7.3 (L) 04/15/2023 1038   ALKPHOS 117 04/15/2023 1038   AST 19 04/15/2023 1038   ALT 9 04/15/2023 1038   BILITOT 0.4 04/15/2023 1038       RADIOGRAPHIC STUDIES: I have personally reviewed Laura radiological images as listed and agreed with Laura findings in Laura report. MR SACRUM SI JOINTS W WO CONTRAST Result Date: 04/08/2023 CLINICAL DATA:  Sacral pain.  Metastatic melanoma EXAM: MRI SACRUM WITHOUT AND WITH CONTRAST TECHNIQUE: Multiplanar and multiecho pulse sequences of Laura sacrum were obtained without and with intravenous contrast. CONTRAST:  4mL GADAVIST GADOBUTROL 1 MMOL/ML IV SOLN COMPARISON:  CT 04/06/2023 and 12/28/2022, PET-CT 02/15/2023, MRI 12/17/2022 FINDINGS: Bones/Joint/Cartilage Innumerable marrow replacing bone lesions throughout Laura sacrum, pelvis, proximal femurs, and included lower lumbar spine. Lesions avidly enhance on postcontrast sequences. Chronic pathologic fracture of Laura L5 vertebral body. No additional pathologic fractures are identified. SI joints are intact without diastasis. Ligaments Intact. Muscles and Tendons Intramuscular edema within Laura bilateral iliacus and bilateral piriformis muscles. There is also intramuscular edema within Laura proximal adductor compartment musculature bilaterally. No acute tendinous abnormality within Laura field of view. Soft tissues No decubitus ulcer. No presacral mass. Trace presacral free fluid. No extraosseous soft tissue masses are identified. IMPRESSION: 1. Innumerable marrow replacing bone lesions throughout Laura sacrum, pelvis, proximal femurs, and included  lower lumbar spine, compatible with known metastatic disease. 2. Chronic pathologic fracture of Laura L5 vertebral body. No additional pathologic fractures are identified. 3. Intramuscular edema within Laura bilateral iliacus and bilateral piriformis muscles. There is also intramuscular edema within Laura proximal adductor compartment musculature bilaterally. Findings are nonspecific and  may be related to muscle strain or myositis. Electronically Signed   By: Duanne Guess D.O.   On: 04/08/2023 09:54   MR THORACIC SPINE W WO CONTRAST Result Date: 04/07/2023 CLINICAL DATA:  T-spine pathologic fracture EXAM: MRI THORACIC WITHOUT AND WITH CONTRAST TECHNIQUE: Multiplanar and multiecho pulse sequences of Laura thoracic spine were obtained without and with intravenous contrast. CONTRAST:  4mL GADAVIST GADOBUTROL 1 MMOL/ML IV SOLN COMPARISON:  CTA chest 04/06/2023 FINDINGS: Alignment:  Physiologic. Vertebrae: There are compression fractures at T3, T4, T8 and L1, as demonstrated on Laura recent CT. No specific features to indicate acuity. There is heterogeneous contrast enhancement throughout Laura spinal column consistent with widespread osseous metastases. Cord:  Normal signal and morphology. Paraspinal and other soft tissues: Numerous hyperintense T2-weighted signal lesions within Laura liver. Small left pleural effusion. Disc levels: There is no spinal canal stenosis.  No nerve root impingement. IMPRESSION: 1. Widespread osseous metastases throughout Laura spinal column. 2. Compression fractures at T3, T4, T8 and L1, as demonstrated on Laura recent CT. No specific features to indicate acuity. 3. No spinal canal stenosis or nerve root impingement. 4. Small left pleural effusion. Electronically Signed   By: Deatra Robinson M.D.   On: 04/07/2023 22:54   MR BRAIN W WO CONTRAST Result Date: 04/07/2023 CLINICAL DATA:  Brain neoplasm, treatment response EXAM: MRI HEAD WITHOUT AND WITH CONTRAST TECHNIQUE: Multiplanar, multiecho pulse sequences of Laura brain and surrounding structures were obtained without and with intravenous contrast. CONTRAST:  4mL GADAVIST GADOBUTROL 1 MMOL/ML IV SOLN COMPARISON:  03/04/2023 FINDINGS: Brain: No acute infarct, mass effect or extra-axial collection. No acute or chronic hemorrhage. There is multifocal hyperintense T2-weighted signal within Laura white matter. Parenchymal volume and CSF  spaces are normal. Laura midline structures are normal. Unchanged appearance of multiple punctate lesions: 1. Anterior right insula series 18, image 105 2. Anterior left frontal cortex, image 110 3. Right frontal lobe, image 120 4. Right frontal lobe, image 129 5. Left frontal lobe lesion has slightly decreased in size, image 152 Vascular: Normal flow voids. Skull and upper cervical spine: Numerous small enhancing lesions of Laura calvarium, unchanged. Sinuses/Orbits:No paranasal sinus fluid levels or advanced mucosal thickening. No mastoid or middle ear effusion. Normal orbits. IMPRESSION: 1. Unchanged or decreased size of multiple punctate metastases. No new lesions. 2. Unchanged appearance of numerous small lesions of Laura calvarium. Electronically Signed   By: Deatra Robinson M.D.   On: 04/07/2023 22:23   CT Angio Chest PE W and/or Wo Contrast Result Date: 04/07/2023 CLINICAL DATA:  High probability pulmonary embolism, fever, chest congestion EXAM: CT ANGIOGRAPHY CHEST WITH CONTRAST TECHNIQUE: Multidetector CT imaging of Laura chest was performed using Laura standard protocol during bolus administration of intravenous contrast. Multiplanar CT image reconstructions and MIPs were obtained to evaluate Laura vascular anatomy. RADIATION DOSE REDUCTION: This exam was performed according to Laura departmental dose-optimization program which includes automated exposure control, adjustment of the mA and/or kV according to [name] size and/or use of iterative reconstruction technique. CONTRAST:  75mL OMNIPAQUE IOHEXOL 350 MG/ML SOLN COMPARISON:  12/28/2022 FINDINGS: Cardiovascular: Adequate opacification of Laura pulmonary arterial tree. No intraluminal filling defect identified to suggest acute pulmonary embolism. Central pulmonary arteries are  of normal caliber. No significant coronary artery calcification. There is thinning of Laura left ventricular apex suggesting prior myocardial infarction in this location. Mild left ventricular  dilation. Global cardiac size within normal limits. Stable trace pericardial fluid. No pericardial effusion. Mild atherosclerotic calcification within Laura thoracic aorta. No aortic aneurysm. Right internal jugular chest port tip seen within Laura superior right atrium. Mediastinum/Nodes: Previously noted pathologic mediastinal adenopathy has resolved. Visualized thyroid is unremarkable. Esophagus is unremarkable. Lungs/Pleura: Left upper lobe pulmonary nodule favored to represent a pulmonary metastasis has undergone interval cavitation decrease in size now measuring 10 mm in keeping with interval response to therapy. Mild emphysema. Mild bibasilar atelectasis. No pneumothorax or pleural effusion. Upper Abdomen: No acute abnormality. Musculoskeletal: Innumerable lytic lesions are seen throughout Laura axial skeleton predominantly within Laura sternum and thoracolumbar spine, with development of pathologic fractures of Laura distal sternal body, T3 and T4 vertebral bodies with resultant accentuated focal thoracic kyphosis, T8 vertebral body with fracture clefts involving both Laura superior and inferior endplate and posterior convex bowing Laura vertebral body without significant canal stenosis. L1 vertebral body with multiple fracture planes involving Laura anterior and posterior vertebral bodies in keeping a burst fracture without associated retropulsion or canal stenosis. Resultant lumbar dextroscoliosis secondary to asymmetric left vertebral body height loss. Fracture anteroinferior aspect of Laura L2 vertebral body also noted. Despite this, there is increasing sclerosis of Laura axial skeleton and multiple largely lytic lesions with no soft tissue component on prior examination demonstrate interval decrease in size and/or resolution of Laura soft tissue component in keeping with response to therapy. Review of Laura MIP images confirms Laura above findings. IMPRESSION: 1. No pulmonary embolism. No acute intrathoracic pathology  identified. 2. Interval response to therapy. Interval cavitation and decrease in size of Laura left upper lobe pulmonary nodule favored to represent a pulmonary metastasis. Previously noted pathologic mediastinal adenopathy has resolved. Extensive osseous metastatic disease demonstrates probable interval response to therapy with resolution of multiple metastatic soft tissue masses improvement in numerous lytic lesions. 3. Interval development of numerous pathologic fractures as outlined above. Surgical consultation for consideration of stabilization T8 and L1 fractures is recommended. Aortic Atherosclerosis (ICD10-I70.0) and Emphysema (ICD10-J43.9). Electronically Signed   By: Helyn Numbers M.D.   On: 04/07/2023 00:21   CT ABDOMEN PELVIS W CONTRAST Result Date: 04/07/2023 CLINICAL DATA:  Sepsis fever x2 days with congestion. Pt denies SOB. Reports body aches and a "fire-poker" pain in tail bone. HR 150 in triage. Pt denies CP, dizziness. Metastatic melanoma. EXAM: CT ABDOMEN AND PELVIS WITH CONTRAST TECHNIQUE: Multidetector CT imaging of Laura abdomen and pelvis was performed using Laura standard protocol following bolus administration of intravenous contrast. RADIATION DOSE REDUCTION: This exam was performed according to Laura departmental dose-optimization program which includes automated exposure control, adjustment of the mA and/or kV according to [name] size and/or use of iterative reconstruction technique. CONTRAST:  75mL OMNIPAQUE IOHEXOL 350 MG/ML SOLN COMPARISON:  PET CT 02/15/2023, CT angiography chest 04/06/2023, x-ray lumbar spine 03/10/2023 FINDINGS: Lower chest: Please see separately dictated CT angiography chest 04/06/2023. Port-A-Cath tip at Laura superior cavoatrial junction. Hepatobiliary: Redemonstration of scattered multiple hepatic hypodense lesions that appear smaller in size with as an example a left hepatic lobe lesion measuring 3.3 x 2.7 cm (from 7.5 x 4.8 cm). Status post cholecystectomy. No  biliary dilatation. Pancreas: No focal lesion. Normal pancreatic contour. No surrounding inflammatory changes. No main pancreatic ductal dilatation. Spleen: Normal in size without focal abnormality.  Splenule noted. Adrenals/Urinary Tract: No adrenal  nodule bilaterally. Bilateral kidneys enhance symmetrically. No hydronephrosis. No hydroureter. Laura urinary bladder is unremarkable. Stomach/Bowel: Stomach is within normal limits. No evidence of bowel wall thickening or dilatation. Appendix appears normal. Vascular/Lymphatic: No abdominal aorta or iliac aneurysm. Severe atherosclerotic plaque of Laura aorta and its branches. No abdominal, pelvic, or inguinal lymphadenopathy. Reproductive: Status post hysterectomy. No adnexal masses. Other: No intraperitoneal free fluid. No intraperitoneal free gas. No organized fluid collection. Musculoskeletal: No abdominal wall hernia or abnormality. Diffuse heterogeneous appearance of Laura axial and appendicular skeleton with underlying lytic metastatic lesions. No acute displaced fracture. Chronic L5 burst fracture with is at least 85% vertebral body height loss and 7 mm retropulsion into Laura central canal. Chronic L1 compression fracture with at least 70% vertebral body height loss. Chronic L2 vertebral body fracture anterior inferiorly. IMPRESSION: 1. No acute intra-abdominal or intrapelvic abnormality in a [name] with metastatic disease. 2. Interval decrease in size of multiple hepatic metastases. 3. Axial and appendicular skeleton lytic metastatic lesions. 4. Chronic L1, L2, L5 fractures. 5.  Aortic Atherosclerosis (ICD10-I70.0). Electronically Signed   By: Tish Frederickson M.D.   On: 04/07/2023 00:08   DG Chest 2 View Result Date: 04/06/2023 CLINICAL DATA:  Fever for 2 days with congestion.  Body aches. EXAM: CHEST - 2 VIEW COMPARISON:  01/03/2023, 03/17/2022. FINDINGS: Laura heart size and mediastinal contours are within normal limits. No consolidation, effusion, or  pneumothorax. A right-sided chest port terminates over Laura superior vena cava. Cervical spinal fusion hardware is noted. There is a compression deformity in Laura midthoracic spine with with loss of vertebral body height of 40%. No retropulsed element is seen. Laura previously described lytic lesions in Laura bones are not well demonstrated radiographically. IMPRESSION: 1. No active cardiopulmonary disease. 2. Compression deformity in Laura midthoracic spine, which is new from Laura previous exam and possible pathologic fracture given history of osseous metastasis. Electronically Signed   By: Thornell Sartorius M.D.   On: 04/06/2023 22:34

## 2023-04-13 NOTE — Assessment & Plan Note (Signed)
 LFT improved. PET showed response. Continue monitor. encorafenib  450 mg once daily with binimetinib 45 mg twice daily. PET scan about end of March, ordered today CBC, CMP, LDH on  with follow up Continue nivolumab

## 2023-04-14 DIAGNOSIS — C7931 Secondary malignant neoplasm of brain: Secondary | ICD-10-CM | POA: Diagnosis not present

## 2023-04-14 DIAGNOSIS — M8458XD Pathological fracture in neoplastic disease, other specified site, subsequent encounter for fracture with routine healing: Secondary | ICD-10-CM | POA: Diagnosis not present

## 2023-04-14 DIAGNOSIS — F411 Generalized anxiety disorder: Secondary | ICD-10-CM | POA: Diagnosis not present

## 2023-04-15 ENCOUNTER — Inpatient Hospital Stay: Payer: BC Managed Care – PPO

## 2023-04-15 ENCOUNTER — Telehealth: Payer: Self-pay

## 2023-04-15 ENCOUNTER — Other Ambulatory Visit: Payer: Self-pay | Admitting: *Deleted

## 2023-04-15 ENCOUNTER — Other Ambulatory Visit (HOSPITAL_COMMUNITY): Payer: Self-pay

## 2023-04-15 ENCOUNTER — Inpatient Hospital Stay (HOSPITAL_BASED_OUTPATIENT_CLINIC_OR_DEPARTMENT_OTHER): Payer: BC Managed Care – PPO

## 2023-04-15 ENCOUNTER — Other Ambulatory Visit: Payer: Self-pay

## 2023-04-15 VITALS — HR 101

## 2023-04-15 VITALS — BP 117/75 | HR 112 | Temp 97.7°F | Resp 17 | Wt 105.5 lb

## 2023-04-15 DIAGNOSIS — C787 Secondary malignant neoplasm of liver and intrahepatic bile duct: Secondary | ICD-10-CM

## 2023-04-15 DIAGNOSIS — C7951 Secondary malignant neoplasm of bone: Secondary | ICD-10-CM | POA: Diagnosis not present

## 2023-04-15 DIAGNOSIS — M8458XS Pathological fracture in neoplastic disease, other specified site, sequela: Secondary | ICD-10-CM

## 2023-04-15 DIAGNOSIS — C439 Malignant melanoma of skin, unspecified: Secondary | ICD-10-CM | POA: Diagnosis present

## 2023-04-15 DIAGNOSIS — R112 Nausea with vomiting, unspecified: Secondary | ICD-10-CM | POA: Diagnosis not present

## 2023-04-15 DIAGNOSIS — D529 Folate deficiency anemia, unspecified: Secondary | ICD-10-CM | POA: Diagnosis not present

## 2023-04-15 DIAGNOSIS — Z5112 Encounter for antineoplastic immunotherapy: Secondary | ICD-10-CM | POA: Diagnosis not present

## 2023-04-15 DIAGNOSIS — G893 Neoplasm related pain (acute) (chronic): Secondary | ICD-10-CM | POA: Diagnosis not present

## 2023-04-15 DIAGNOSIS — D520 Dietary folate deficiency anemia: Secondary | ICD-10-CM

## 2023-04-15 DIAGNOSIS — Z515 Encounter for palliative care: Secondary | ICD-10-CM

## 2023-04-15 DIAGNOSIS — C7931 Secondary malignant neoplasm of brain: Secondary | ICD-10-CM | POA: Diagnosis not present

## 2023-04-15 DIAGNOSIS — Z79620 Long term (current) use of immunosuppressive biologic: Secondary | ICD-10-CM | POA: Diagnosis not present

## 2023-04-15 DIAGNOSIS — R110 Nausea: Secondary | ICD-10-CM

## 2023-04-15 DIAGNOSIS — E860 Dehydration: Secondary | ICD-10-CM

## 2023-04-15 LAB — CBC WITH DIFFERENTIAL (CANCER CENTER ONLY)
Abs Immature Granulocytes: 0.02 10*3/uL (ref 0.00–0.07)
Basophils Absolute: 0 10*3/uL (ref 0.0–0.1)
Basophils Relative: 1 %
Eosinophils Absolute: 0 10*3/uL (ref 0.0–0.5)
Eosinophils Relative: 0 %
HCT: 26.6 % — ABNORMAL LOW (ref 36.0–46.0)
Hemoglobin: 8.7 g/dL — ABNORMAL LOW (ref 12.0–15.0)
Immature Granulocytes: 1 %
Lymphocytes Relative: 37 %
Lymphs Abs: 1.4 10*3/uL (ref 0.7–4.0)
MCH: 31.2 pg (ref 26.0–34.0)
MCHC: 32.7 g/dL (ref 30.0–36.0)
MCV: 95.3 fL (ref 80.0–100.0)
Monocytes Absolute: 0.4 10*3/uL (ref 0.1–1.0)
Monocytes Relative: 10 %
Neutro Abs: 1.9 10*3/uL (ref 1.7–7.7)
Neutrophils Relative %: 51 %
Platelet Count: 362 10*3/uL (ref 150–400)
RBC: 2.79 MIL/uL — ABNORMAL LOW (ref 3.87–5.11)
RDW: 14.3 % (ref 11.5–15.5)
WBC Count: 3.6 10*3/uL — ABNORMAL LOW (ref 4.0–10.5)
nRBC: 0 % (ref 0.0–0.2)

## 2023-04-15 LAB — CMP (CANCER CENTER ONLY)
ALT: 9 U/L (ref 0–44)
AST: 19 U/L (ref 15–41)
Albumin: 2.4 g/dL — ABNORMAL LOW (ref 3.5–5.0)
Alkaline Phosphatase: 117 U/L (ref 38–126)
Anion gap: 2 — ABNORMAL LOW (ref 5–15)
BUN: 9 mg/dL (ref 6–20)
CO2: 25 mmol/L (ref 22–32)
Calcium: 7.3 mg/dL — ABNORMAL LOW (ref 8.9–10.3)
Chloride: 111 mmol/L (ref 98–111)
Creatinine: 0.73 mg/dL (ref 0.44–1.00)
GFR, Estimated: >60 mL/min (ref >60)
Glucose, Bld: 90 mg/dL (ref 70–99)
Potassium: 4.6 mmol/L (ref 3.5–5.1)
Sodium: 138 mmol/L (ref 135–145)
Total Bilirubin: 0.4 mg/dL (ref 0.0–1.2)
Total Protein: 4.5 g/dL — ABNORMAL LOW (ref 6.5–8.1)

## 2023-04-15 LAB — T4, FREE: Free T4: 0.97 ng/dL (ref 0.61–1.12)

## 2023-04-15 LAB — FERRITIN: Ferritin: 237 ng/mL (ref 11–307)

## 2023-04-15 LAB — TSH: TSH: 2.953 u[IU]/mL (ref 0.350–4.500)

## 2023-04-15 LAB — LACTATE DEHYDROGENASE: LDH: 339 U/L — ABNORMAL HIGH (ref 98–192)

## 2023-04-15 LAB — FOLATE: Folate: 2.5 ng/mL — ABNORMAL LOW (ref >5.9)

## 2023-04-15 MED ORDER — SCOPOLAMINE 1 MG/3DAYS TD PT72
1.0000 | MEDICATED_PATCH | TRANSDERMAL | 12 refills | Status: DC
  Filled 2023-04-16: qty 10, 30d supply, fill #0
  Filled 2023-05-07 – 2023-05-09 (×2): qty 10, 30d supply, fill #1
  Filled 2023-05-31 – 2023-06-08 (×2): qty 10, 30d supply, fill #2

## 2023-04-15 MED ORDER — SODIUM CHLORIDE 0.9 % IV SOLN
240.0000 mg | Freq: Once | INTRAVENOUS | Status: AC
  Administered 2023-04-15: 240 mg via INTRAVENOUS
  Filled 2023-04-15: qty 24

## 2023-04-15 MED ORDER — SODIUM CHLORIDE 0.9% FLUSH
10.0000 mL | Freq: Once | INTRAVENOUS | Status: AC | PRN
  Administered 2023-04-15: 10 mL

## 2023-04-15 MED ORDER — SODIUM CHLORIDE 0.9 % IV SOLN: INTRAVENOUS | Status: DC

## 2023-04-15 MED ORDER — HEPARIN SOD (PORK) LOCK FLUSH 100 UNIT/ML IV SOLN
500.0000 [IU] | Freq: Once | INTRAVENOUS | Status: AC | PRN
  Administered 2023-04-15: 500 [IU]

## 2023-04-15 MED ORDER — SODIUM CHLORIDE 0.9% FLUSH
10.0000 mL | INTRAVENOUS | Status: DC | PRN
  Administered 2023-04-15: 10 mL

## 2023-04-15 NOTE — Patient Instructions (Signed)
 [facility] - A DEPT OF [facility]  Discharge Instructions: Thank you for choosing [facility] to provide your oncology and hematology care.   If you have a lab appointment with the Cancer Center, please go directly to the Cancer Center and check in at the registration area.   Wear comfortable clothing and clothing appropriate for easy access to any Portacath or PICC line.   We strive to give you quality time with your provider. You may need to reschedule your appointment if you arrive late (15 or more minutes).  Arriving late affects you and other patients whose appointments are after yours.  Also, if you miss three or more appointments without notifying the office, you may be dismissed from the clinic at the provider's discretion.      For prescription refill requests, have your pharmacy contact our office and allow 72 hours for refills to be completed.    Today you received the following chemotherapy and/or immunotherapy agents: Opdivo      To help prevent nausea and vomiting after your treatment, we encourage you to take your nausea medication as directed.  BELOW ARE SYMPTOMS THAT SHOULD BE REPORTED IMMEDIATELY: *FEVER GREATER THAN 100.4 F (38 C) OR HIGHER *CHILLS OR SWEATING *NAUSEA AND VOMITING THAT IS NOT CONTROLLED WITH YOUR NAUSEA MEDICATION *UNUSUAL SHORTNESS OF BREATH *UNUSUAL BRUISING OR BLEEDING *URINARY PROBLEMS (pain or burning when urinating, or frequent urination) *BOWEL PROBLEMS (unusual diarrhea, constipation, pain near the anus) TENDERNESS IN MOUTH AND THROAT WITH OR WITHOUT PRESENCE OF ULCERS (sore throat, sores in mouth, or a toothache) UNUSUAL RASH, SWELLING OR PAIN  UNUSUAL VAGINAL DISCHARGE OR ITCHING   Items with * indicate a potential emergency and should be followed up as soon as possible or go to the Emergency Department if any problems should occur.  Please show the CHEMOTHERAPY ALERT CARD or IMMUNOTHERAPY  ALERT CARD at check-in to the Emergency Department and triage nurse.  Should you have questions after your visit or need to cancel or reschedule your appointment, please contact [facility] - A DEPT OF [facility]  Dept: [phone]  and follow the prompts.  Office hours are 8:00 a.m. to 4:30 p.m. Monday - Friday. Please note that voicemails left after 4:00 p.m. may not be returned until the following business day.  We are closed weekends and major holidays. You have access to a nurse at all times for urgent questions. Please call the main number to the clinic Dept: [phone] and follow the prompts.   For any non-urgent questions, you may also contact your provider using MyChart. We now offer e-Visits for anyone 57 and older to request care online for non-urgent symptoms. For details visit [email or web address].   Also download the MyChart app! Go to the app store, search "MyChart", open the app, select , and log in with your MyChart username and password.

## 2023-04-15 NOTE — Assessment & Plan Note (Signed)
 Zometa every 4 weeks Calcium & vitamin D daily

## 2023-04-15 NOTE — Telephone Encounter (Signed)
 Completed - Informed patient of updated changes made to appts.

## 2023-04-15 NOTE — Progress Notes (Signed)
 Patient presents to infusion today with port accessed. Per patient, while inpatient in the hospital, an RN had difficulty with port, patient states she Banker) "pressed on" the port. Upon accessing patient port today, she states they had difficulty again getting it accessed, but was able to do so successfully. Patient also informed this RN that she has noticed her port felt like it was in a different position. She made MD aware of these findings also during office visit today. This RN was able to obtained adequate blood return and received okay to continue with treatment today using port from MD. A port evaluation with IR was order, they will contact patient to schedule. Patient made aware and agreeable to plan.

## 2023-04-15 NOTE — Assessment & Plan Note (Signed)
Folic acid 1mg daily

## 2023-04-15 NOTE — Assessment & Plan Note (Signed)
 Continue olanzapine Continue scopolamine patch. Not need phenergan Continue pepcid.

## 2023-04-15 NOTE — Assessment & Plan Note (Signed)
 Continue Mektovi and Braftovi She has no new symptoms.

## 2023-04-16 ENCOUNTER — Telehealth: Payer: Self-pay

## 2023-04-16 ENCOUNTER — Other Ambulatory Visit (HOSPITAL_COMMUNITY): Payer: Self-pay

## 2023-04-16 NOTE — Telephone Encounter (Signed)
 Completed - Notified patient of appts scheduled until May15th.

## 2023-04-19 ENCOUNTER — Other Ambulatory Visit (HOSPITAL_COMMUNITY): Payer: Self-pay

## 2023-04-19 ENCOUNTER — Other Ambulatory Visit: Payer: Self-pay

## 2023-04-20 ENCOUNTER — Ambulatory Visit (HOSPITAL_COMMUNITY)
Admission: RE | Admit: 2023-04-20 | Discharge: 2023-04-20 | Disposition: A | Discharge status: Home / Self Care | Source: Ambulatory Visit

## 2023-04-20 ENCOUNTER — Other Ambulatory Visit: Payer: Self-pay

## 2023-04-20 DIAGNOSIS — Z452 Encounter for adjustment and management of vascular access device: Secondary | ICD-10-CM | POA: Diagnosis not present

## 2023-04-20 DIAGNOSIS — C787 Secondary malignant neoplasm of liver and intrahepatic bile duct: Secondary | ICD-10-CM | POA: Diagnosis not present

## 2023-04-20 HISTORY — PX: IR CV LINE INJECTION: IMG2294

## 2023-04-20 MED ORDER — HEPARIN SOD (PORK) LOCK FLUSH 100 UNIT/ML IV SOLN
INTRAVENOUS | Status: AC
Start: 2023-04-20 — End: ?
  Filled 2023-04-20: qty 5

## 2023-04-20 MED ORDER — HEPARIN SOD (PORK) LOCK FLUSH 100 UNIT/ML IV SOLN
INTRAVENOUS | Status: AC
  Filled 2023-04-20: qty 5

## 2023-04-21 ENCOUNTER — Inpatient Hospital Stay: Payer: BC Managed Care – PPO

## 2023-04-21 ENCOUNTER — Inpatient Hospital Stay (HOSPITAL_BASED_OUTPATIENT_CLINIC_OR_DEPARTMENT_OTHER): Payer: BC Managed Care – PPO | Admitting: Nurse Practitioner

## 2023-04-21 ENCOUNTER — Encounter: Payer: Self-pay | Admitting: Nurse Practitioner

## 2023-04-21 VITALS — BP 102/83 | HR 127 | Temp 98.2°F | Resp 16

## 2023-04-21 DIAGNOSIS — Z515 Encounter for palliative care: Secondary | ICD-10-CM | POA: Diagnosis not present

## 2023-04-21 DIAGNOSIS — G893 Neoplasm related pain (acute) (chronic): Secondary | ICD-10-CM | POA: Diagnosis not present

## 2023-04-21 DIAGNOSIS — Z5112 Encounter for antineoplastic immunotherapy: Secondary | ICD-10-CM | POA: Diagnosis not present

## 2023-04-21 DIAGNOSIS — C787 Secondary malignant neoplasm of liver and intrahepatic bile duct: Secondary | ICD-10-CM

## 2023-04-21 DIAGNOSIS — E860 Dehydration: Secondary | ICD-10-CM

## 2023-04-21 DIAGNOSIS — C7931 Secondary malignant neoplasm of brain: Secondary | ICD-10-CM | POA: Diagnosis not present

## 2023-04-21 DIAGNOSIS — C439 Malignant melanoma of skin, unspecified: Secondary | ICD-10-CM | POA: Diagnosis not present

## 2023-04-21 DIAGNOSIS — C7951 Secondary malignant neoplasm of bone: Secondary | ICD-10-CM | POA: Diagnosis not present

## 2023-04-21 DIAGNOSIS — R530 Neoplastic (malignant) related fatigue: Secondary | ICD-10-CM

## 2023-04-21 DIAGNOSIS — Z79620 Long term (current) use of immunosuppressive biologic: Secondary | ICD-10-CM | POA: Diagnosis not present

## 2023-04-21 DIAGNOSIS — R110 Nausea: Secondary | ICD-10-CM | POA: Diagnosis not present

## 2023-04-21 DIAGNOSIS — D529 Folate deficiency anemia, unspecified: Secondary | ICD-10-CM | POA: Diagnosis not present

## 2023-04-21 DIAGNOSIS — R112 Nausea with vomiting, unspecified: Secondary | ICD-10-CM | POA: Diagnosis not present

## 2023-04-21 MED ORDER — SODIUM CHLORIDE 0.9% FLUSH: 10.0000 mL | Freq: Once | INTRAVENOUS | Status: DC | PRN

## 2023-04-21 MED ORDER — SODIUM CHLORIDE 0.9 % IV SOLN
INTRAVENOUS | Status: AC
Start: 2023-04-21 — End: 2023-04-21

## 2023-04-21 MED ORDER — HEPARIN SOD (PORK) LOCK FLUSH 100 UNIT/ML IV SOLN: 500.0000 [IU] | Freq: Once | INTRAVENOUS | Status: DC | PRN

## 2023-04-21 NOTE — Progress Notes (Signed)
 Palliative Medicine [facility]  Telephone:([phone] [phone]) [phone]   Name: Laura [name] Date: 04/21/2023 MRN: [id number]  DOB: 09-13-1965  Patient Care Team: [name], [name] as PCP - General (Family Medicine) Pickenpack-Cousar, Arty Baumgartner, NP as Nurse Practitioner [facility] and Palliative Medicine)    INTERVAL HISTORY: Laura [name] is a 58 y.o. female with oncologic medical history including malignant melanoma (10/2022) with metastatic disease to the bone. As well as a history of hypertension, hyperlipidemia, GERD, and arthritis. Palliative ask to see for symptom management and goals of care.   SOCIAL HISTORY:     reports that she quit smoking about 7 years ago. Her smoking use included cigarettes. She started smoking about 27 years ago. She has a 20 pack-year smoking history. She has never used smokeless tobacco. She reports that she does not drink alcohol and does not use drugs.  ADVANCE DIRECTIVES:  None on file   CODE STATUS: Full code  PAST MEDICAL HISTORY: Past Medical History:  Diagnosis Date   Anxiety    Arthritis    Complication of anesthesia    Fibromyalgia    Gallstones 04/20/2018   GERD (gastroesophageal reflux disease)    History of blood transfusion    post Hysterectomy   Hypercholesteremia    Melanoma (HCC)    on back and excised   Metastatic melanoma (HCC)    Migraine    PONV (postoperative nausea and vomiting)    Tobacco abuse    Vertebral artery disease (HCC)    history of left vertebral artery dissection 2011    ALLERGIES:  is allergic to aspirin and tylenol [acetaminophen].  MEDICATIONS:  Current Outpatient Medications  Medication Sig Dispense Refill   binimetinib (MEKTOVI) 15 MG tablet Take 3 tablets (45 mg total) by mouth 2 (two) times daily. 180 tablet 11   docusate sodium (COLACE) 100 MG capsule Take 100-200 mg by mouth daily.     dronabinol (MARINOL) 10 MG capsule Take 1 capsule (10 mg total) by  mouth 3 (three) times daily. 90 capsule 0   encorafenib (BRAFTOVI) 75 MG capsule Take 6 capsules (450 mg total) by mouth daily. 180 capsule 11   famotidine (PEPCID) 20 MG tablet Take 20 mg by mouth 2 (two) times daily.     fentaNYL (DURAGESIC) 12 MCG/HR Place 1 patch onto the skin every 3 (three) days. 10 patch 0   gabapentin (NEURONTIN) 100 MG capsule Take 1 capsule (100 mg total) by mouth 2 (two) times daily. 60 capsule 0   glycopyrrolate (ROBINUL) 1 MG tablet Take 1 tablet (1 mg total) by mouth 2 (two) times daily as needed. 30 tablet 2   hydrOXYzine (ATARAX) 25 MG tablet Take 25 mg by mouth daily.     ibuprofen (ADVIL) 600 MG tablet Take 1 tablet (600 mg total) by mouth every 6 (six) hours as needed for fever, headache or mild pain (pain score 1-3). 30 tablet 0   lidocaine-prilocaine (EMLA) cream Apply to port site 30-45 min prior to appointment. 30 g 6   OLANZapine (ZYPREXA) 2.5 MG tablet Take 1 tablet (2.5 mg total) by mouth at bedtime. 30 tablet 1   ondansetron (ZOFRAN-ODT) 8 MG disintegrating tablet Dissolve 1 tablet (8 mg total) by mouth every 8 (eight) hours as needed for nausea or vomiting. 60 tablet 2   potassium chloride (KLOR-CON M) 10 MEQ tablet Take 2 tablets (20 mEq total) by mouth 2 (two) times daily. 120 tablet 1   promethazine (PHENERGAN)  25 MG tablet Take 1 tablet (25 mg total) by mouth every 8 (eight) hours as needed for nausea or vomiting. 60 tablet 0   rosuvastatin (CRESTOR) 10 MG tablet Take 10 mg by mouth at bedtime.     scopolamine (TRANSDERM-SCOP) 1 MG/3DAYS Place 1 patch (1.5 mg total) onto the skin every 3 (three) days. 10 patch 12   traMADol (ULTRAM) 50 MG tablet Take 1-2 tablets (50-100 mg total) by mouth every 6 (six) hours as needed for moderate pain (pain score 4-6) or severe pain (pain score 7-10). 90 tablet 0   zolpidem (AMBIEN CR) 12.5 MG CR tablet Take 1 tablet (12.5 mg total) by mouth at bedtime as needed for sleep. 30 tablet 2   No current  facility-administered medications for this visit.    VITAL SIGNS: There were no vitals taken for this visit. There were no vitals filed for this visit.  Estimated body mass index is 19.3 kg/m as calculated from the following:   Height as of 04/06/23: 5\' 2"  (1.575 m).   Weight as of 04/15/23: 105 lb 8 oz (47.9 kg).   PERFORMANCE STATUS (ECOG) : 1 - Symptomatic but completely ambulatory  Physical Exam General: NAD Cardiovascular: regular rate and rhythm Pulmonary: normal breathing pattern Extremities: no edema, no joint deformities Skin: no rashes Neurological: AAO x3  IMPRESSION: Discussed the use of AI scribe software for clinical note transcription with the patient, who gave verbal consent to proceed.  History of Present Illness Laura [name] is a 58 year old female who presents with worsening back pain. Patient seen in infusion. Tolerating without difficulty. Accompanied by her son. Her appetite is good, and she denies constipation. Weight stable at 105lb.  We discussed her pain at length.  Patient recently discharged from the hospital where she received treatment for SIRS.  During this hospital stay patient was found to have compression fracture of T8 and L1 specific to her metastatic disease.  During hospitalization she was able to tolerate IV and oral hydromorphone.  [name] reports she is experiencing significant pain that has been worsening over the past week. The pain is severe, rated at 6 or 7 out of 10, and prevents her from standing up straight. She has been attempting to increase her physical activity by walking instead of using a wheelchair and performing household tasks like making beds, which she finds time-consuming due to her pain.  Her pain also limits her ability to perform certain tasks for long periods of time requiring frequent rest breaks.  She is currently taking tramadol, two tablets every four to six hours, but it does not provide relief.  Regimen also  includes fentanyl 12.5 mcg patch, which initially was managing her pain well, but she feels that its effectiveness has diminished. She feels that her condition has worsened.  Patient was discharged home with hydromorphone 1 mg every 6 hours as needed.  Advised patient to continue with her breakthrough medications.  Education provided on increasing her fentanyl patch to 25 mcg.  Patient will utilize home dose applying to 12.5 mcg patches until she exhaust her home supply.  We will plan to continue to closely monitor and adjust regimen as needed.  All questions answered and support provided.  I discussed the importance of continued conversation with family and their medical providers regarding overall plan of care and treatment options, ensuring decisions are within the context of the patients values and GOCs. Assessment & Plan Cancer related pain Current pain management with tramadol and  fentanyl patch is inadequate. Considered increasing fentanyl due to previous effectiveness and tolerability. - Increase fentanyl patch by adding a second 12 mcg/hour patch (25 mcg). Monitor for drowsiness or confusion. Remove second patch if adverse effects occur. -Gabapentin 100 mg twice daily - Continue tramadol and hydromorphone as prescribed. - Follow up via phone on Friday to assess pain control and side effects. - Contact provider via MyChart for any issues before follow-up.  Nausea Nausea currently controlled on current on regimen.  Appetite has improved. -Continue famotidine 20 mg twice daily -Robinul 1 mg twice daily as needed -Olanzapine 2.5 mg at bedtime -Ondansetron 8 mg every 8 hours as needed -Promethazine 25 mg every 8 hours as needed -Scopolamine patch 1.5 change every 72 hours  I will plan to see patient back in the clinic 3-4 weeks.  Sooner if needed.  Patient expressed understanding and was in agreement with this plan. She also understands that She can call the clinic at any time with any  questions, concerns, or complaints.   Any controlled substances utilized were prescribed in the context of palliative care. PDMP has been reviewed.   Visit consisted of counseling and education dealing with the complex and emotionally intense issues of symptom management and palliative care in the setting of serious and potentially life-threatening illness.  Willette Alma, AGPCNP-BC  Palliative Medicine Team/[facility]

## 2023-04-23 ENCOUNTER — Telehealth: Payer: Self-pay

## 2023-04-23 NOTE — Telephone Encounter (Signed)
 Attempted to call pt to check in on increase in pain medication, no answer, LVM and callback number.

## 2023-04-26 ENCOUNTER — Other Ambulatory Visit: Payer: Self-pay

## 2023-04-26 ENCOUNTER — Telehealth: Payer: Self-pay

## 2023-04-26 ENCOUNTER — Other Ambulatory Visit (HOSPITAL_COMMUNITY): Payer: Self-pay

## 2023-04-26 ENCOUNTER — Other Ambulatory Visit: Payer: Self-pay | Admitting: Nurse Practitioner

## 2023-04-26 DIAGNOSIS — G893 Neoplasm related pain (acute) (chronic): Secondary | ICD-10-CM

## 2023-04-26 DIAGNOSIS — Z515 Encounter for palliative care: Secondary | ICD-10-CM

## 2023-04-26 DIAGNOSIS — C7931 Secondary malignant neoplasm of brain: Secondary | ICD-10-CM

## 2023-04-26 DIAGNOSIS — C7951 Secondary malignant neoplasm of bone: Secondary | ICD-10-CM

## 2023-04-26 MED ORDER — TRAMADOL HCL 50 MG PO TABS
50.0000 mg | ORAL_TABLET | Freq: Four times a day (QID) | ORAL | 0 refills | Status: DC | PRN
  Filled 2023-04-26: qty 90, 12d supply, fill #0

## 2023-04-26 NOTE — Telephone Encounter (Signed)
 Pt sent in refill request for tramadol, RN called pt husband and confirmed she is no longer taking dilaudid. Pt is tolerating fentanyl patch well, tramadol refill sent to [facility].

## 2023-04-26 NOTE — Telephone Encounter (Signed)
 VM received from [place], RN Case Manager w/ [employer], reporting that pt told her that she had fallen over the weekend. TC to pt to obtain details and reached her husband. Pt's husband states that pt fell in the kitchen, hitting her back on a metal trash can, on the morning of 3/29. Pt told her husband that she did not feel faint or dizzy prior to falling and does not understood why she fell, and she does not believe she lost consciousness. Pt reports no pain in her back, but states it is a little sore to the touch. Husband denies redness, bruising, and/or swelling to the area. Informed that Dr. Cherly Hensen will be notified of this. Pt's husband states that he wants to "run something by Dr. Cherly Hensen before pt's visit on 4/3." He thinks that pt is beginning to decline, and he states, "I'm afraid she will wake up one day and not remember me," so he wants to take pt on a cruise in late May "before things get worse." He states pt wants to get away as well, although this will mean that some of her appointments will have to be rearranged. Informed that Dr. Cherly Hensen will be notified of this and that it can be discussed at her OV on 4/3. Advised, in the meantime, to notify the office if pt has dizziness or feels lightheaded or complains of back pain that could be related to her fall. He verbalizes understanding.

## 2023-04-27 ENCOUNTER — Other Ambulatory Visit: Payer: Self-pay

## 2023-04-27 ENCOUNTER — Ambulatory Visit: Payer: BC Managed Care – PPO

## 2023-04-27 ENCOUNTER — Other Ambulatory Visit: Payer: BC Managed Care – PPO

## 2023-04-27 ENCOUNTER — Other Ambulatory Visit (HOSPITAL_COMMUNITY): Payer: Self-pay

## 2023-04-27 MED ORDER — PROCHLORPERAZINE MALEATE 10 MG PO TABS
10.0000 mg | ORAL_TABLET | Freq: Four times a day (QID) | ORAL | 2 refills | Status: DC | PRN
  Filled 2023-04-27 (×2): qty 30, 8d supply, fill #0
  Filled 2023-05-05: qty 30, 8d supply, fill #1
  Filled 2023-05-20: qty 30, 8d supply, fill #2

## 2023-04-27 NOTE — Assessment & Plan Note (Signed)
Folic acid 1mg daily

## 2023-04-27 NOTE — Progress Notes (Unsigned)
 [facility] OFFICE PROGRESS NOTE  Patient Care Team: [name], [name] as PCP - General (Family Medicine) Pickenpack-Cousar, Arty Baumgartner, NP as Nurse Practitioner [facility] and Palliative Medicine)  Diagnosis: 11/25/2022. Diffuse liver, bone, lung metastases. Brain metastases after starting systemic treatment.  Rapidly rising liver enzymes, LDH with profound disease burden. Treatment: 12/11/2022. First line ipi/nivo. Completed 2 cycles.  Admitted for worsening LFT, large amount of metastases in the liver. 12/14/22-12/28/22 radiation to L-spine 30Gy/28fx 01/09/23  vemurafenib and cobimetinib due to disease uncontrolled or worsening symptoms, liver metastases. 03/24/23 switched to Braftovi and Mektovi due to suboptimal CNS response.   04/07/23 MRI of the brain showed interval response in some area of CNS metastases and stable disease in others.  Restaging PET pending.  Clinically with improvement of functional status.  Patient would like to go on a trip on a cruise, will extend nivolumab treatment to every 4 weeks at 480 mg each.  New persistent visual disturbance with blurry vision.  Possible clinically targeted therapy.  Will refer for ophthalmology.  New symptoms of dysuria.  Will need ER evaluation today Assessment & Plan Melanoma metastatic to liver (HCC) LFT stable. New PET pending.  Continue encorafenib  450 mg once daily with binimetinib 45 mg twice daily. CBC, CMP, LDH every visit with follow up Continue nivolumab, can change to every 4 weeks dosage once stable Dietary folate deficiency anemia Folic acid 1 mg daily Pathological fracture of vertebra due to neoplastic disease, sequela Zometa every 4 weeks Calcium & vitamin D daily Cancer associated pain Continue fentanyl patch Continue follow-up with palliative care  Nausea and vomiting, unspecified vomiting type Continue olanzapine nightly Continue scopolamine patch. Compazine and zofran as needed Continue  pepcid. Insomnia, unspecified type Refill ambien Dysuria UA today Blurred vision Persistent Potential side effect from targeted therapy. Will refer to ophthalmology   Orders Placed This Encounter  Procedures   Urinalysis, Complete w Microscopic    Standing Status:   Future    Expiration Date:   04/28/2024     Melven Sartorius, MD  INTERVAL HISTORY: Patient returns for follow-up.  Overall she is feeling better.  Report eating and drinking has improved.  She is using Marinol with help.  Report of fall over the weekend accidentally on her buttock.  This resulted in bruising back pain.  Some pain started this morning after PET scan.  Otherwise, she continues to feel better compared to few months ago.  No saddle paresthesia.  Some urinary tingling on urination today.  No fever or chills.  No bowel issues and able to have bowel movement and passing gas regularly.  Diarrhea.  Blurry vision.   Oncology History  Melanoma metastatic to liver [facility])  11/16/2022 Imaging   She presented to the emergency room on 11/16/2022 for back pain x 2 weeks and central chest pain for several months   CTA CAP IMPRESSION: 1. Negative for aortic dissection or aneurysm. 2. 1.2 cm left upper lobe nodule.  For 3. Multiple ill-defined liver lesions, suspicious for metastatic disease. 4. Multiple lytic osseous lesions, suspicious for metastatic disease. Pathologic fracture of the superior endplate of L5   11/25/2022 Pathology Results   1. Liver, needle/core biopsy, Left hepatic lobe lesion :       - METASTATIC MELANOMA.    12/04/2022 Initial Diagnosis   Melanoma of skin (HCC) Liver, bone metastases. 1.2 cm LUL nodule   12/07/2022 Cancer Staging   Staging form: Melanoma of the Skin, AJCC 8th Edition - Clinical:  Stage IV (cTX, cN1, pM1d(0)) - Signed by Melven Sartorius, MD on 03/01/2023   12/09/2022 PET scan   PET: Diffuse bone and liver metastases.  Lung metastases.    12/11/2022 -  Chemotherapy   Patient is  on Treatment Plan : MELANOMA Nivolumab (1) + Ipilimumab (3) q21d / Nivolumab (480) q28d     12/17/2022 Imaging   MRI brain 1. Three small right hemisphere brain metastases metastases ranging from 2 mm to 4 mm. Minimal hemosiderin. No associated cerebral edema or mass effect. No other metastatic disease identified in the brain.   2. Known osseous metastatic disease but no destructive lesion identified in the skull or visible cervical spine.   3. Moderate for age cerebral white matter changes most commonly due to small vessel disease.   12/17/2022 Imaging   MRI brain 1. Three small right hemisphere brain metastases metastases ranging from 2 mm to 4 mm. Minimal hemosiderin. No associated cerebral edema or mass effect. No other metastatic disease identified in the brain.   2. Known osseous metastatic disease but no destructive lesion identified in the skull or visible cervical spine.   3. Moderate for age cerebral white matter changes most commonly due to small vessel disease.   02/15/2023 PET scan   PET whole body  1. The degree metastatic disease defined on recent CT scan is less impressive on FDG PET scan. 2. There is intense hypermetabolic lesion in the lateral segment of the LEFT hepatic lobe which corresponds to a large 3.7 cm mass. Additional small hepatic lesions have mild metabolic activity. 3. Very low metabolic activity associated with a LEFT upper lobe pulmonary nodule which is decreased in size. Favor positive response to therapy. 4. Small LEFT hilar hypermetabolic node is indeterminate. 5. Widespread lytic lesions throughout the axillary appendicular skeleton. Majority these lesions do not have hypermetabolic activity. One soft tissue lesion in the LEFT sacral ala does have mild metabolic activity.      PHYSICAL EXAMINATION: ECOG PERFORMANCE STATUS: 2 - Symptomatic, <50% confined to bed  Vitals:   04/29/23 0855  BP: 107/80  Pulse: (!) 131  Resp: 16  Temp:  99.7 F (37.6 C)  SpO2: 100%   Filed Weights   04/29/23 0855  Weight: 99 lb 12.8 oz (45.3 kg)   Gen: no distress Lungs: Normal effort Abdomen: Soft, nontender and nondistended Skin: No rash Neuro: Lower extremity strength 5 out of 5 and equal bilaterally sensation equal bilaterally  Relevant data reviewed during this visit included lab and imaging.

## 2023-04-27 NOTE — Assessment & Plan Note (Signed)
 LFT stable. New PET pending.  Continue encorafenib  450 mg once daily with binimetinib 45 mg twice daily. CBC, CMP, LDH every visit with follow up Continue nivolumab, can change to every 4 weeks dosage once stable

## 2023-04-29 ENCOUNTER — Telehealth: Payer: Self-pay

## 2023-04-29 ENCOUNTER — Encounter (HOSPITAL_COMMUNITY)
Admission: RE | Admit: 2023-04-29 | Discharge: 2023-04-29 | Disposition: A | Discharge status: Home / Self Care | Source: Ambulatory Visit

## 2023-04-29 ENCOUNTER — Inpatient Hospital Stay: Payer: BC Managed Care – PPO | Attending: Physician Assistant

## 2023-04-29 ENCOUNTER — Inpatient Hospital Stay (HOSPITAL_BASED_OUTPATIENT_CLINIC_OR_DEPARTMENT_OTHER): Payer: BC Managed Care – PPO

## 2023-04-29 ENCOUNTER — Other Ambulatory Visit (HOSPITAL_COMMUNITY): Payer: Self-pay

## 2023-04-29 ENCOUNTER — Inpatient Hospital Stay: Payer: BC Managed Care – PPO

## 2023-04-29 VITALS — BP 107/80 | HR 131 | Temp 99.7°F | Resp 16 | Wt 99.8 lb

## 2023-04-29 VITALS — HR 128

## 2023-04-29 DIAGNOSIS — C787 Secondary malignant neoplasm of liver and intrahepatic bile duct: Secondary | ICD-10-CM

## 2023-04-29 DIAGNOSIS — R300 Dysuria: Secondary | ICD-10-CM | POA: Insufficient documentation

## 2023-04-29 DIAGNOSIS — I10 Essential (primary) hypertension: Secondary | ICD-10-CM | POA: Diagnosis not present

## 2023-04-29 DIAGNOSIS — R7402 Elevation of levels of lactic acid dehydrogenase (LDH): Secondary | ICD-10-CM | POA: Diagnosis not present

## 2023-04-29 DIAGNOSIS — G4700 Insomnia, unspecified: Secondary | ICD-10-CM | POA: Insufficient documentation

## 2023-04-29 DIAGNOSIS — C7800 Secondary malignant neoplasm of unspecified lung: Secondary | ICD-10-CM | POA: Diagnosis not present

## 2023-04-29 DIAGNOSIS — R112 Nausea with vomiting, unspecified: Secondary | ICD-10-CM | POA: Insufficient documentation

## 2023-04-29 DIAGNOSIS — C439 Malignant melanoma of skin, unspecified: Secondary | ICD-10-CM | POA: Insufficient documentation

## 2023-04-29 DIAGNOSIS — R748 Abnormal levels of other serum enzymes: Secondary | ICD-10-CM | POA: Diagnosis not present

## 2023-04-29 DIAGNOSIS — H538 Other visual disturbances: Secondary | ICD-10-CM | POA: Diagnosis not present

## 2023-04-29 DIAGNOSIS — L659 Nonscarring hair loss, unspecified: Secondary | ICD-10-CM | POA: Insufficient documentation

## 2023-04-29 DIAGNOSIS — G893 Neoplasm related pain (acute) (chronic): Secondary | ICD-10-CM

## 2023-04-29 DIAGNOSIS — M1990 Unspecified osteoarthritis, unspecified site: Secondary | ICD-10-CM | POA: Insufficient documentation

## 2023-04-29 DIAGNOSIS — E876 Hypokalemia: Secondary | ICD-10-CM | POA: Diagnosis not present

## 2023-04-29 DIAGNOSIS — M8458XS Pathological fracture in neoplastic disease, other specified site, sequela: Secondary | ICD-10-CM

## 2023-04-29 DIAGNOSIS — C7931 Secondary malignant neoplasm of brain: Secondary | ICD-10-CM | POA: Diagnosis not present

## 2023-04-29 DIAGNOSIS — D520 Dietary folate deficiency anemia: Secondary | ICD-10-CM

## 2023-04-29 DIAGNOSIS — C7951 Secondary malignant neoplasm of bone: Secondary | ICD-10-CM | POA: Diagnosis not present

## 2023-04-29 DIAGNOSIS — Z5112 Encounter for antineoplastic immunotherapy: Secondary | ICD-10-CM | POA: Diagnosis not present

## 2023-04-29 DIAGNOSIS — E860 Dehydration: Secondary | ICD-10-CM | POA: Insufficient documentation

## 2023-04-29 LAB — URINALYSIS, COMPLETE (UACMP) WITH MICROSCOPIC
Bilirubin Urine: NEGATIVE
Glucose, UA: NEGATIVE mg/dL
Hgb urine dipstick: NEGATIVE
Ketones, ur: 5 mg/dL — AB
Leukocytes,Ua: NEGATIVE
Nitrite: NEGATIVE
Protein, ur: NEGATIVE mg/dL
Specific Gravity, Urine: 1.015 (ref 1.005–1.030)
pH: 5 (ref 5.0–8.0)

## 2023-04-29 LAB — CBC WITH DIFFERENTIAL (CANCER CENTER ONLY)
Abs Immature Granulocytes: 0.01 10*3/uL (ref 0.00–0.07)
Basophils Absolute: 0 10*3/uL (ref 0.0–0.1)
Basophils Relative: 0 %
Eosinophils Absolute: 0 10*3/uL (ref 0.0–0.5)
Eosinophils Relative: 0 %
HCT: 30.1 % — ABNORMAL LOW (ref 36.0–46.0)
Hemoglobin: 9.9 g/dL — ABNORMAL LOW (ref 12.0–15.0)
Immature Granulocytes: 0 %
Lymphocytes Relative: 15 %
Lymphs Abs: 0.8 10*3/uL (ref 0.7–4.0)
MCH: 31.1 pg (ref 26.0–34.0)
MCHC: 32.9 g/dL (ref 30.0–36.0)
MCV: 94.7 fL (ref 80.0–100.0)
Monocytes Absolute: 0.3 10*3/uL (ref 0.1–1.0)
Monocytes Relative: 5 %
Neutro Abs: 4 10*3/uL (ref 1.7–7.7)
Neutrophils Relative %: 80 %
Platelet Count: 312 10*3/uL (ref 150–400)
RBC: 3.18 MIL/uL — ABNORMAL LOW (ref 3.87–5.11)
RDW: 14 % (ref 11.5–15.5)
WBC Count: 5.1 10*3/uL (ref 4.0–10.5)
nRBC: 0 % (ref 0.0–0.2)

## 2023-04-29 LAB — CMP (CANCER CENTER ONLY)
ALT: 6 U/L (ref 0–44)
AST: 16 U/L (ref 15–41)
Albumin: 2.8 g/dL — ABNORMAL LOW (ref 3.5–5.0)
Alkaline Phosphatase: 96 U/L (ref 38–126)
Anion gap: 7 (ref 5–15)
BUN: 13 mg/dL (ref 6–20)
CO2: 24 mmol/L (ref 22–32)
Calcium: 7.1 mg/dL — ABNORMAL LOW (ref 8.9–10.3)
Chloride: 104 mmol/L (ref 98–111)
Creatinine: 1.01 mg/dL — ABNORMAL HIGH (ref 0.44–1.00)
GFR, Estimated: >60 mL/min (ref >60)
Glucose, Bld: 181 mg/dL — ABNORMAL HIGH (ref 70–99)
Potassium: 3.6 mmol/L (ref 3.5–5.1)
Sodium: 135 mmol/L (ref 135–145)
Total Bilirubin: 0.3 mg/dL (ref 0.0–1.2)
Total Protein: 5.1 g/dL — ABNORMAL LOW (ref 6.5–8.1)

## 2023-04-29 LAB — LACTATE DEHYDROGENASE: LDH: 290 U/L — ABNORMAL HIGH (ref 98–192)

## 2023-04-29 LAB — GLUCOSE, CAPILLARY: Glucose-Capillary: 96 mg/dL (ref 70–99)

## 2023-04-29 MED ORDER — SODIUM CHLORIDE 0.9 % IV SOLN: INTRAVENOUS | Status: DC

## 2023-04-29 MED ORDER — SODIUM CHLORIDE 0.9 % IV SOLN
240.0000 mg | Freq: Once | INTRAVENOUS | Status: AC
  Administered 2023-04-29: 240 mg via INTRAVENOUS
  Filled 2023-04-29: qty 24

## 2023-04-29 MED ORDER — SODIUM CHLORIDE 0.9% FLUSH
10.0000 mL | INTRAVENOUS | Status: DC | PRN
Start: 2023-04-29 — End: 2023-04-29
  Administered 2023-04-29: 10 mL

## 2023-04-29 MED ORDER — ZOLPIDEM TARTRATE ER 12.5 MG PO TBCR
12.5000 mg | EXTENDED_RELEASE_TABLET | Freq: Every evening | ORAL | 2 refills | Status: DC | PRN
  Filled 2023-04-29 – 2023-05-20 (×2): qty 30, 30d supply, fill #0
  Filled 2023-05-31 – 2023-06-27 (×3): qty 30, 30d supply, fill #1

## 2023-04-29 MED ORDER — HEPARIN SOD (PORK) LOCK FLUSH 100 UNIT/ML IV SOLN
500.0000 [IU] | Freq: Once | INTRAVENOUS | Status: AC | PRN
Start: 2023-04-29 — End: 2023-04-29
  Administered 2023-04-29: 500 [IU]

## 2023-04-29 MED ORDER — FLUDEOXYGLUCOSE F - 18 (FDG) INJECTION
5.2000 | Freq: Once | INTRAVENOUS | Status: AC | PRN
  Administered 2023-04-29: 5.2 via INTRAVENOUS

## 2023-04-29 MED ORDER — SODIUM CHLORIDE 0.9% FLUSH
10.0000 mL | Freq: Once | INTRAVENOUS | Status: AC | PRN
  Administered 2023-04-29: 10 mL

## 2023-04-29 NOTE — Assessment & Plan Note (Addendum)
 Continue olanzapine nightly Continue scopolamine patch. Compazine and zofran as needed Continue pepcid.

## 2023-04-29 NOTE — Assessment & Plan Note (Signed)
UA today

## 2023-04-29 NOTE — Telephone Encounter (Signed)
 Scheduling questions. I asked the Patient their preference for days of the week and times that work best for them.

## 2023-04-29 NOTE — Addendum Note (Signed)
 Addended by: Geanie Berlin on: 04/29/2023 12:47 PM   Modules accepted: Orders

## 2023-04-29 NOTE — Assessment & Plan Note (Addendum)
 Persistent Potential side effect from targeted therapy. Will refer to ophthalmology

## 2023-04-29 NOTE — Assessment & Plan Note (Addendum)
 Zometa every 4 weeks Calcium & vitamin D daily

## 2023-04-29 NOTE — Assessment & Plan Note (Addendum)
 Continue fentanyl patch Continue follow-up with palliative care

## 2023-04-30 ENCOUNTER — Telehealth: Payer: Self-pay

## 2023-04-30 NOTE — Telephone Encounter (Signed)
 TC to pt to assess urinary status. Spoke w/ husband and pt (speaker phone). Pt reports frequency of urine w/ "tingling" two days ago, but this has subsided and she now denies any problems. She reports that she has urinated only once this morning and she did not experience any dysuria, hematuria, or "tingling." Informed that a very small amount of bacteria was noted in her urine yesterday, which could indicate a UTI. Advised to push PO fluids and drink some cranberry juice if she likes this. Advised to notify office if she develops urinary frequency, dysuria or tingling w/ urination, and/or fever, and reiterated calling the main line at [phone] over the weekend. Pt and husband verbalize understanding. Dr. Cherly Hensen notified of the above information and says there is no need for antibiotics at this time.

## 2023-04-30 NOTE — Telephone Encounter (Signed)
-----   Message from Melven Sartorius sent at 04/30/2023 11:07 AM EDT ----- Angelica Chessman if she has UTI symptoms, please give one time dose of fosfomycin 3 gm. If no symptoms today, will not need antibiotics. Thanks.

## 2023-05-01 DIAGNOSIS — R0602 Shortness of breath: Secondary | ICD-10-CM | POA: Diagnosis not present

## 2023-05-01 DIAGNOSIS — C7931 Secondary malignant neoplasm of brain: Secondary | ICD-10-CM | POA: Diagnosis not present

## 2023-05-01 DIAGNOSIS — C439 Malignant melanoma of skin, unspecified: Secondary | ICD-10-CM | POA: Diagnosis not present

## 2023-05-05 ENCOUNTER — Inpatient Hospital Stay: Payer: BC Managed Care – PPO

## 2023-05-05 ENCOUNTER — Inpatient Hospital Stay: Admitting: Dietician

## 2023-05-05 VITALS — BP 105/82 | HR 104 | Temp 98.2°F | Resp 16

## 2023-05-05 DIAGNOSIS — C7931 Secondary malignant neoplasm of brain: Secondary | ICD-10-CM | POA: Diagnosis not present

## 2023-05-05 DIAGNOSIS — L659 Nonscarring hair loss, unspecified: Secondary | ICD-10-CM | POA: Diagnosis not present

## 2023-05-05 DIAGNOSIS — H538 Other visual disturbances: Secondary | ICD-10-CM | POA: Diagnosis not present

## 2023-05-05 DIAGNOSIS — G4700 Insomnia, unspecified: Secondary | ICD-10-CM | POA: Diagnosis not present

## 2023-05-05 DIAGNOSIS — I10 Essential (primary) hypertension: Secondary | ICD-10-CM | POA: Diagnosis not present

## 2023-05-05 DIAGNOSIS — R112 Nausea with vomiting, unspecified: Secondary | ICD-10-CM | POA: Diagnosis not present

## 2023-05-05 DIAGNOSIS — M1990 Unspecified osteoarthritis, unspecified site: Secondary | ICD-10-CM | POA: Diagnosis not present

## 2023-05-05 DIAGNOSIS — C439 Malignant melanoma of skin, unspecified: Secondary | ICD-10-CM | POA: Diagnosis not present

## 2023-05-05 DIAGNOSIS — C787 Secondary malignant neoplasm of liver and intrahepatic bile duct: Secondary | ICD-10-CM | POA: Diagnosis not present

## 2023-05-05 DIAGNOSIS — E860 Dehydration: Secondary | ICD-10-CM | POA: Diagnosis not present

## 2023-05-05 DIAGNOSIS — Z5112 Encounter for antineoplastic immunotherapy: Secondary | ICD-10-CM | POA: Diagnosis not present

## 2023-05-05 DIAGNOSIS — R300 Dysuria: Secondary | ICD-10-CM | POA: Diagnosis not present

## 2023-05-05 DIAGNOSIS — C7800 Secondary malignant neoplasm of unspecified lung: Secondary | ICD-10-CM | POA: Diagnosis not present

## 2023-05-05 DIAGNOSIS — G893 Neoplasm related pain (acute) (chronic): Secondary | ICD-10-CM | POA: Diagnosis not present

## 2023-05-05 DIAGNOSIS — E876 Hypokalemia: Secondary | ICD-10-CM | POA: Diagnosis not present

## 2023-05-05 DIAGNOSIS — C7951 Secondary malignant neoplasm of bone: Secondary | ICD-10-CM | POA: Diagnosis not present

## 2023-05-05 MED ORDER — SODIUM CHLORIDE 0.9 % IV SOLN
INTRAVENOUS | Status: AC
Start: 2023-05-05 — End: 2023-05-05

## 2023-05-05 MED ORDER — HEPARIN SOD (PORK) LOCK FLUSH 100 UNIT/ML IV SOLN
500.0000 [IU] | Freq: Once | INTRAVENOUS | Status: AC | PRN
Start: 2023-05-05 — End: 2023-05-05
  Administered 2023-05-05: 500 [IU]

## 2023-05-05 MED ORDER — SODIUM CHLORIDE 0.9% FLUSH
10.0000 mL | Freq: Once | INTRAVENOUS | Status: AC | PRN
  Administered 2023-05-05: 10 mL

## 2023-05-05 NOTE — Progress Notes (Signed)
 Nutrition Follow-up:   Pt with malignant melanoma metastatic to brain, liver, bone. She is receiving nivolumab q28d.   Patient receiving IVF today  Met with patient in infusion. She reports eating better since starting marinol. Pt continues to have days that she does not want anything. This usually happens once a week. Yesterday pt had omelette for breakfast, chicken and rice for lunch, loaded salad (eggs, bacon, [place], mushrooms) for dinner. She inconsistently drinks CIB for added calories/protein. Patient has intermittent episodes of nausea and vomiting. She "threw her guts up" this morning. Pt takes zofran-odt which helps. Bowel movements are much better. Had a BM last night.   Medications: marinol (3/14)  Labs: 4/3 - glucose 181, Cr 1.01, albumin 2.8, Corrected Ca 8.14  Anthropometrics: Last wt 99 lb 12.8 oz on 4/3 decreased ~6% in 2 weeks (severe for time frame)  3/20 - 105 lb 8 oz 3/6 - 101 lb 9.6 oz 2/5 - 108 lb 3.9 oz   NUTRITION DIAGNOSIS: Inadequate oral intake - continues, however slowly improving   INTERVENTION:  Reinforced importance of adequate calorie/protein energy intake Encourage high calorie high protein foods to promote wt gain Recommend daily CIB with whole milk + increasing to 3/day when po is challenging Pt agreeable to bedtime snack (spoonful of PB, glass of whole milk) Continue marinol for appetite    MONITORING, EVALUATION, GOAL: wt trends, intake   NEXT VISIT: Thursday May 8 during infusion

## 2023-05-05 NOTE — Patient Instructions (Signed)

## 2023-05-06 ENCOUNTER — Ambulatory Visit (HOSPITAL_COMMUNITY): Payer: BC Managed Care – PPO

## 2023-05-07 ENCOUNTER — Other Ambulatory Visit: Payer: Self-pay | Admitting: Nurse Practitioner

## 2023-05-07 ENCOUNTER — Other Ambulatory Visit: Payer: Self-pay

## 2023-05-07 ENCOUNTER — Other Ambulatory Visit (HOSPITAL_COMMUNITY): Payer: Self-pay

## 2023-05-07 DIAGNOSIS — Z515 Encounter for palliative care: Secondary | ICD-10-CM

## 2023-05-07 DIAGNOSIS — G893 Neoplasm related pain (acute) (chronic): Secondary | ICD-10-CM

## 2023-05-07 DIAGNOSIS — C7931 Secondary malignant neoplasm of brain: Secondary | ICD-10-CM

## 2023-05-07 DIAGNOSIS — C7951 Secondary malignant neoplasm of bone: Secondary | ICD-10-CM

## 2023-05-07 MED ORDER — GABAPENTIN 100 MG PO CAPS
100.0000 mg | ORAL_CAPSULE | Freq: Two times a day (BID) | ORAL | 0 refills | Status: DC
  Filled 2023-05-07 – 2023-05-12 (×2): qty 60, 30d supply, fill #0

## 2023-05-07 MED ORDER — DRONABINOL 10 MG PO CAPS
10.0000 mg | ORAL_CAPSULE | Freq: Three times a day (TID) | ORAL | 0 refills | Status: DC
  Filled 2023-05-07 – 2023-05-12 (×2): qty 90, 30d supply, fill #0

## 2023-05-07 MED ORDER — TRAMADOL HCL 50 MG PO TABS
50.0000 mg | ORAL_TABLET | Freq: Four times a day (QID) | ORAL | 0 refills | Status: DC | PRN
  Filled 2023-05-07: qty 90, 12d supply, fill #0

## 2023-05-08 ENCOUNTER — Other Ambulatory Visit: Payer: Self-pay

## 2023-05-08 DIAGNOSIS — E876 Hypokalemia: Secondary | ICD-10-CM

## 2023-05-10 ENCOUNTER — Other Ambulatory Visit: Payer: Self-pay

## 2023-05-10 ENCOUNTER — Other Ambulatory Visit: Payer: Self-pay | Admitting: Nurse Practitioner

## 2023-05-10 ENCOUNTER — Other Ambulatory Visit (HOSPITAL_COMMUNITY): Payer: Self-pay

## 2023-05-10 MED ORDER — FENTANYL 25 MCG/HR TD PT72
1.0000 | MEDICATED_PATCH | TRANSDERMAL | 0 refills | Status: DC
  Filled 2023-05-10 – 2023-05-12 (×2): qty 10, 30d supply, fill #0

## 2023-05-10 MED ORDER — POTASSIUM CHLORIDE CRYS ER 10 MEQ PO TBCR
20.0000 meq | EXTENDED_RELEASE_TABLET | Freq: Two times a day (BID) | ORAL | 1 refills | Status: DC
  Filled 2023-05-10: qty 120, 30d supply, fill #0

## 2023-05-11 ENCOUNTER — Other Ambulatory Visit: Payer: Self-pay

## 2023-05-11 ENCOUNTER — Other Ambulatory Visit: Payer: BC Managed Care – PPO

## 2023-05-11 ENCOUNTER — Ambulatory Visit: Payer: BC Managed Care – PPO

## 2023-05-11 ENCOUNTER — Telehealth: Payer: Self-pay

## 2023-05-11 ENCOUNTER — Other Ambulatory Visit (HOSPITAL_COMMUNITY): Payer: Self-pay

## 2023-05-11 NOTE — Telephone Encounter (Signed)
 Letter written for pt, approving her to travel on a cruise beginning on 06/18/23 (pending no onset of acute symptoms prior to that time) per Dr. Alita Irwin. Husband made aware on MyChart to pick this letter up, along w/ a copy of pt's most recent OV note and med list, at pt's infusion appt on 05/13/23.

## 2023-05-12 ENCOUNTER — Other Ambulatory Visit (HOSPITAL_COMMUNITY): Payer: Self-pay

## 2023-05-12 ENCOUNTER — Telehealth: Payer: Self-pay

## 2023-05-12 ENCOUNTER — Other Ambulatory Visit: Payer: Self-pay

## 2023-05-12 NOTE — Telephone Encounter (Signed)
 After being notified that pt's most recent labs were done on 4/3 and she does not have a lab appointment tomorrow prior to her infusion appointment, contacted Dr. Alita Irwin and informed him of this. He states pt is okay to receive treatment tomorrow without any additional labs. Notified infusion charge Charity fundraiser.

## 2023-05-13 ENCOUNTER — Encounter: Payer: Self-pay | Admitting: Nurse Practitioner

## 2023-05-13 ENCOUNTER — Inpatient Hospital Stay (HOSPITAL_BASED_OUTPATIENT_CLINIC_OR_DEPARTMENT_OTHER): Admitting: Nurse Practitioner

## 2023-05-13 ENCOUNTER — Other Ambulatory Visit (HOSPITAL_COMMUNITY): Payer: Self-pay

## 2023-05-13 ENCOUNTER — Inpatient Hospital Stay

## 2023-05-13 VITALS — BP 129/84 | HR 93 | Temp 97.7°F | Resp 18 | Ht 62.0 in | Wt 100.0 lb

## 2023-05-13 DIAGNOSIS — E860 Dehydration: Secondary | ICD-10-CM

## 2023-05-13 DIAGNOSIS — C787 Secondary malignant neoplasm of liver and intrahepatic bile duct: Secondary | ICD-10-CM | POA: Diagnosis not present

## 2023-05-13 DIAGNOSIS — R530 Neoplastic (malignant) related fatigue: Secondary | ICD-10-CM | POA: Diagnosis not present

## 2023-05-13 DIAGNOSIS — C439 Malignant melanoma of skin, unspecified: Secondary | ICD-10-CM | POA: Diagnosis not present

## 2023-05-13 DIAGNOSIS — R630 Anorexia: Secondary | ICD-10-CM

## 2023-05-13 DIAGNOSIS — C7951 Secondary malignant neoplasm of bone: Secondary | ICD-10-CM | POA: Diagnosis not present

## 2023-05-13 DIAGNOSIS — C7800 Secondary malignant neoplasm of unspecified lung: Secondary | ICD-10-CM | POA: Diagnosis not present

## 2023-05-13 DIAGNOSIS — G4700 Insomnia, unspecified: Secondary | ICD-10-CM | POA: Diagnosis not present

## 2023-05-13 DIAGNOSIS — G893 Neoplasm related pain (acute) (chronic): Secondary | ICD-10-CM

## 2023-05-13 DIAGNOSIS — I10 Essential (primary) hypertension: Secondary | ICD-10-CM | POA: Diagnosis not present

## 2023-05-13 DIAGNOSIS — E876 Hypokalemia: Secondary | ICD-10-CM | POA: Diagnosis not present

## 2023-05-13 DIAGNOSIS — L659 Nonscarring hair loss, unspecified: Secondary | ICD-10-CM | POA: Diagnosis not present

## 2023-05-13 DIAGNOSIS — Z515 Encounter for palliative care: Secondary | ICD-10-CM

## 2023-05-13 DIAGNOSIS — H538 Other visual disturbances: Secondary | ICD-10-CM | POA: Diagnosis not present

## 2023-05-13 DIAGNOSIS — R300 Dysuria: Secondary | ICD-10-CM | POA: Diagnosis not present

## 2023-05-13 DIAGNOSIS — Z5112 Encounter for antineoplastic immunotherapy: Secondary | ICD-10-CM | POA: Diagnosis not present

## 2023-05-13 DIAGNOSIS — R110 Nausea: Secondary | ICD-10-CM

## 2023-05-13 DIAGNOSIS — C7931 Secondary malignant neoplasm of brain: Secondary | ICD-10-CM | POA: Diagnosis not present

## 2023-05-13 DIAGNOSIS — R112 Nausea with vomiting, unspecified: Secondary | ICD-10-CM | POA: Diagnosis not present

## 2023-05-13 DIAGNOSIS — M1990 Unspecified osteoarthritis, unspecified site: Secondary | ICD-10-CM | POA: Diagnosis not present

## 2023-05-13 MED ORDER — ZOLEDRONIC ACID 4 MG/5ML IV CONC
3.5000 mg | Freq: Once | INTRAVENOUS | Status: AC
  Administered 2023-05-13: 3.5 mg via INTRAVENOUS
  Filled 2023-05-13: qty 4.38

## 2023-05-13 MED ORDER — SODIUM CHLORIDE 0.9 % IV SOLN: INTRAVENOUS | Status: AC

## 2023-05-13 MED ORDER — SODIUM CHLORIDE 0.9 % IV SOLN: INTRAVENOUS | Status: DC

## 2023-05-13 MED ORDER — SODIUM CHLORIDE 0.9 % IV SOLN
480.0000 mg | Freq: Once | INTRAVENOUS | Status: AC
  Administered 2023-05-13: 480 mg via INTRAVENOUS
  Filled 2023-05-13: qty 48

## 2023-05-13 MED ORDER — SODIUM CHLORIDE 0.9 % IV SOLN
1.0000 g | Freq: Once | INTRAVENOUS | Status: AC
  Administered 2023-05-13: 1 g via INTRAVENOUS
  Filled 2023-05-13: qty 10

## 2023-05-13 MED ORDER — HEPARIN SOD (PORK) LOCK FLUSH 100 UNIT/ML IV SOLN
500.0000 [IU] | Freq: Once | INTRAVENOUS | Status: AC | PRN
  Administered 2023-05-13: 500 [IU]

## 2023-05-13 MED ORDER — SODIUM CHLORIDE 0.9% FLUSH
10.0000 mL | INTRAVENOUS | Status: DC | PRN
  Administered 2023-05-13: 10 mL

## 2023-05-13 NOTE — Progress Notes (Signed)
 Ok to proceed with Zometa today with corrected Ca=8.1. Ca 1g IVPB order entered per Dr. Alita Irwin.  Kjersti Dittmer, PharmD, MBA

## 2023-05-13 NOTE — Progress Notes (Signed)
 Per Dr. Alita Irwin, okay to proceed w/ treatment today w/ HR 119.

## 2023-05-13 NOTE — Progress Notes (Signed)
 Palliative Medicine [facility]  [phone] [phone]   Name: Laura [name] Date: 05/13/2023 MRN: [id number]  DOB: 08/21/1965  Patient Care Team: [name], [name] as PCP - General (Family Medicine) Pickenpack-Cousar, Arty Baumgartner, NP as Nurse Practitioner [facility] and Palliative Medicine)    INTERVAL HISTORY: Laura [name] is a 57 y.o. female with oncologic medical history including malignant melanoma (10/2022) with metastatic disease to the bone. As well as a history of hypertension, hyperlipidemia, GERD, and arthritis. Palliative ask to see for symptom management and goals of care.   SOCIAL HISTORY:     reports that she quit smoking about 7 years ago. Her smoking use included cigarettes. She started smoking about 27 years ago. She has a 20 pack-year smoking history. She has never used smokeless tobacco. She reports that she does not drink alcohol and does not use drugs.  ADVANCE DIRECTIVES:  None on file   CODE STATUS: Full code  PAST MEDICAL HISTORY: Past Medical History:  Diagnosis Date   Anxiety    Arthritis    Complication of anesthesia    Fibromyalgia    Gallstones 04/20/2018   GERD (gastroesophageal reflux disease)    History of blood transfusion    post Hysterectomy   Hypercholesteremia    Melanoma (HCC)    on back and excised   Metastatic melanoma (HCC)    Migraine    PONV (postoperative nausea and vomiting)    Tobacco abuse    Vertebral artery disease (HCC)    history of left vertebral artery dissection 2011    ALLERGIES:  is allergic to aspirin and tylenol [acetaminophen].  MEDICATIONS:  Current Outpatient Medications  Medication Sig Dispense Refill   binimetinib (MEKTOVI) 15 MG tablet Take 3 tablets (45 mg total) by mouth 2 (two) times daily. 180 tablet 11   docusate sodium (COLACE) 100 MG capsule Take 100-200 mg by mouth daily.     dronabinol (MARINOL) 10 MG capsule Take 1 capsule (10 mg total) by  mouth 3 (three) times daily. 90 capsule 0   encorafenib (BRAFTOVI) 75 MG capsule Take 6 capsules (450 mg total) by mouth daily. 180 capsule 11   famotidine (PEPCID) 20 MG tablet Take 20 mg by mouth 2 (two) times daily.     fentaNYL (DURAGESIC) 25 MCG/HR Place 1 patch onto the skin every 3 (three) days. 10 patch 0   gabapentin (NEURONTIN) 100 MG capsule Take 1 capsule (100 mg total) by mouth 2 (two) times daily. 60 capsule 0   glycopyrrolate (ROBINUL) 1 MG tablet Take 1 tablet (1 mg total) by mouth 2 (two) times daily as needed. 30 tablet 2   hydrOXYzine (ATARAX) 25 MG tablet Take 25 mg by mouth daily.     ibuprofen (ADVIL) 600 MG tablet Take 1 tablet (600 mg total) by mouth every 6 (six) hours as needed for fever, headache or mild pain (pain score 1-3). 30 tablet 0   lidocaine-prilocaine (EMLA) cream Apply to port site 30-45 min prior to appointment. 30 g 6   OLANZapine (ZYPREXA) 2.5 MG tablet Take 1 tablet (2.5 mg total) by mouth at bedtime. 30 tablet 1   ondansetron (ZOFRAN-ODT) 8 MG disintegrating tablet Dissolve 1 tablet (8 mg total) by mouth every 8 (eight) hours as needed for nausea or vomiting. 60 tablet 2   potassium chloride (KLOR-CON M) 10 MEQ tablet Take 2 tablets (20 mEq total) by mouth 2 (two) times daily. 120 tablet 1   prochlorperazine (COMPAZINE)  10 MG tablet Take 1 tablet (10 mg total) by mouth every 6 (six) hours as needed for nausea or vomiting. 30 tablet 2   rosuvastatin (CRESTOR) 10 MG tablet Take 10 mg by mouth at bedtime.     scopolamine (TRANSDERM-SCOP) 1 MG/3DAYS Place 1 patch (1.5 mg total) onto the skin every 3 (three) days. 10 patch 12   traMADol (ULTRAM) 50 MG tablet Take 1-2 tablets (50-100 mg total) by mouth every 6 (six) hours as needed for moderate pain (pain score 4-6) or severe pain (pain score 7-10). 90 tablet 0   zolpidem (AMBIEN CR) 12.5 MG CR tablet Take 1 tablet (12.5 mg total) by mouth at bedtime as needed for sleep. 30 tablet 2   No current  facility-administered medications for this visit.   Facility-Administered Medications Ordered in Other Visits  Medication Dose Route Frequency Provider Last Rate Last Admin   0.9 %  sodium chloride infusion   Intravenous Continuous Geanie Berlin C, MD       sodium chloride flush (NS) 0.9 % injection 10 mL  10 mL Intracatheter PRN Melven Sartorius, MD   10 mL at 05/13/23 1310    VITAL SIGNS: There were no vitals taken for this visit. There were no vitals filed for this visit.  Estimated body mass index is 18.29 kg/m as calculated from the following:   Height as of an earlier encounter on 05/13/23: 5\' 2"  (1.575 m).   Weight as of an earlier encounter on 05/13/23: 100 lb (45.4 kg).   PERFORMANCE STATUS (ECOG) : 1 - Symptomatic but completely ambulatory  Physical Exam General: NAD Cardiovascular: regular rate and rhythm Pulmonary: normal breathing pattern Extremities: no edema, no joint deformities Skin: no rashes Neurological: AAO x3  IMPRESSION: Discussed the use of AI scribe software for clinical note transcription with the patient, who gave verbal consent to proceed.  History of Present Illness Laura [name] is a 58 year old female who presents for follow-up regarding her pain management. Patient seen during infusion. No acute distress. Continues to do well overall. Emotional expressing recent observation of hair loss and thinning. Emotional support provided. She continues to remain as active as possible. She experiences periodic nausea and vomiting, though these symptoms have decreased in frequency compared to before. She feels better overall and is eating more consistently, though not as much as she should. Weight stable at 100lbs.   She experiences ongoing lower back pain, which occasionally radiates to her arm. The pain persists but is being managed with a Fentanyl patch and as needed Tramadol, which she finds effective. No adjustments to regimen at this time.   All questions  answered and support provided.   I discussed the importance of continued conversation with family and their medical providers regarding overall plan of care and treatment options, ensuring decisions are within the context of the patients values and GOCs. Assessment & Plan Cancer related pain Pain managed with fentanyl patch and tramadol. Back brace discussed but may increase pain. Tolerating regimen without difficulty.  - Fentanyl patch 25 mcg.  -Gabapentin 100 mg twice daily - Continue tramadol as needed - Contact provider via MyChart for any issues before follow-up.  Nausea and vomiting Intermittent symptoms improved with current management. -Continue famotidine 20 mg twice daily -Robinul 1 mg twice daily as needed -Olanzapine 2.5 mg at bedtime -Ondansetron 8 mg every 8 hours as needed -Promethazine 25 mg every 8 hours as needed -Scopolamine patch 1.5 change every 72 hours Nutritional intake Improvement noted but  intake still suboptimal. - Encourage improved nutritional intake and monitor.  I will plan to see patient back in the clinic 3-4 weeks.  Sooner if needed.  Patient expressed understanding and was in agreement with this plan. She also understands that She can call the clinic at any time with any questions, concerns, or complaints.   Any controlled substances utilized were prescribed in the context of palliative care. PDMP has been reviewed.   Visit consisted of counseling and education dealing with the complex and emotionally intense issues of symptom management and palliative care in the setting of serious and potentially life-threatening illness.  Dellia Ferguson, AGPCNP-BC  Palliative Medicine Team/[facility]

## 2023-05-13 NOTE — Patient Instructions (Signed)
 [facility] - A DEPT OF [facility]  Discharge Instructions: Thank you for choosing [facility] to provide your oncology and hematology care.   If you have a lab appointment with the Cancer Center, please go directly to the Cancer Center and check in at the registration area.   Wear comfortable clothing and clothing appropriate for easy access to any Portacath or PICC line.   We strive to give you quality time with your provider. You may need to reschedule your appointment if you arrive late (15 or more minutes).  Arriving late affects you and other patients whose appointments are after yours.  Also, if you miss three or more appointments without notifying the office, you may be dismissed from the clinic at the provider's discretion.      For prescription refill requests, have your pharmacy contact our office and allow 72 hours for refills to be completed.    Today you received the following chemotherapy and/or immunotherapy agents :  Nivolumab,  Calcium replacement.   To help prevent nausea and vomiting after your treatment, we encourage you to take your nausea medication as directed.  BELOW ARE SYMPTOMS THAT SHOULD BE REPORTED IMMEDIATELY: *FEVER GREATER THAN 100.4 F (38 C) OR HIGHER *CHILLS OR SWEATING *NAUSEA AND VOMITING THAT IS NOT CONTROLLED WITH YOUR NAUSEA MEDICATION *UNUSUAL SHORTNESS OF BREATH *UNUSUAL BRUISING OR BLEEDING *URINARY PROBLEMS (pain or burning when urinating, or frequent urination) *BOWEL PROBLEMS (unusual diarrhea, constipation, pain near the anus) TENDERNESS IN MOUTH AND THROAT WITH OR WITHOUT PRESENCE OF ULCERS (sore throat, sores in mouth, or a toothache) UNUSUAL RASH, SWELLING OR PAIN  UNUSUAL VAGINAL DISCHARGE OR ITCHING   Items with * indicate a potential emergency and should be followed up as soon as possible or go to the Emergency Department if any problems should occur.  Please show the CHEMOTHERAPY ALERT  CARD or IMMUNOTHERAPY ALERT CARD at check-in to the Emergency Department and triage nurse.  Should you have questions after your visit or need to cancel or reschedule your appointment, please contact [facility] - A DEPT OF [facility]  Dept: [phone]  and follow the prompts.  Office hours are 8:00 a.m. to 4:30 p.m. Monday - Friday. Please note that voicemails left after 4:00 p.m. may not be returned until the following business day.  We are closed weekends and major holidays. You have access to a nurse at all times for urgent questions. Please call the main number to the clinic Dept: [phone] and follow the prompts.   For any non-urgent questions, you may also contact your provider using MyChart. We now offer e-Visits for anyone 64 and older to request care online for non-urgent symptoms. For details visit [email or web address].   Also download the MyChart app! Go to the app store, search "MyChart", open the app, select [facility], and log in with your MyChart username and password.

## 2023-05-14 ENCOUNTER — Other Ambulatory Visit (HOSPITAL_COMMUNITY): Payer: Self-pay

## 2023-05-17 ENCOUNTER — Other Ambulatory Visit: Payer: Self-pay

## 2023-05-18 NOTE — Assessment & Plan Note (Signed)
 Stable. Persistent Potential side effect from targeted therapy. Will refer to ophthalmology

## 2023-05-18 NOTE — Assessment & Plan Note (Signed)
Folic acid 1mg daily

## 2023-05-18 NOTE — Assessment & Plan Note (Signed)
 Continue Mektovi  and Braftovi  She has no new symptoms. Zometa  today

## 2023-05-18 NOTE — Progress Notes (Unsigned)
 [facility] OFFICE PROGRESS NOTE  Patient Care Team: Darnelle Elders, [name] as PCP - General (Family Medicine) Pickenpack-Cousar, Giles Labrum, NP as Nurse Practitioner [facility] and Palliative Medicine)  Diagnosis: 11/25/2022. Diffuse liver, bone, lung metastases. Brain metastases after starting systemic treatment.  Rapidly rising liver enzymes, LDH with profound disease burden. Treatment: 12/11/2022. First line ipi/nivo. Completed 2 cycles.  Admitted for worsening LFT, large amount of metastases in the liver. 12/14/22-12/28/22 radiation to L-spine 30Gy/36fx 01/09/23  vemurafenib  and cobimetinib due to disease uncontrolled or worsening symptoms, liver metastases. 03/24/23 switched to Braftovi  and Mektovi  due to suboptimal CNS response.   04/07/23 MRI of the brain showed interval response in some area of CNS metastases and stable disease in others. Assessment & Plan Melanoma metastatic to liver (HCC) LFT stable.  Repeat PET in June Continue encorafenib   450 mg once daily with binimetinib  45 mg twice daily. CBC, CMP, LDH every visit with follow up Continue nivolumab , can change to every 4 weeks dosage once stable  No orders of the defined types were placed in this encounter.    [name], MD  INTERVAL HISTORY: Patient returns for follow-up.  Oncology History  Melanoma metastatic to liver [facility])  11/16/2022 Imaging   She presented to the emergency room on 11/16/2022 for back pain x 2 weeks and central chest pain for several months   CTA CAP IMPRESSION: 1. Negative for aortic dissection or aneurysm. 2. 1.2 cm left upper lobe nodule.  For 3. Multiple ill-defined liver lesions, suspicious for metastatic disease. 4. Multiple lytic osseous lesions, suspicious for metastatic disease. Pathologic fracture of the superior endplate of L5   11/25/2022 Pathology Results   1. Liver, needle/core biopsy, Left hepatic lobe lesion :       - METASTATIC MELANOMA.    12/04/2022  Initial Diagnosis   Melanoma of skin (HCC) Liver, bone metastases. 1.2 cm LUL nodule   12/07/2022 Cancer Staging   Staging form: Melanoma of the Skin, AJCC 8th Edition - Clinical: Stage IV (cTX, cN1, pM1d(0)) - Signed by [name], MD on 03/01/2023   12/09/2022 PET scan   PET: Diffuse bone and liver metastases.  Lung metastases.    12/11/2022 -  Chemotherapy   Patient is on Treatment Plan : MELANOMA Nivolumab  (1) + Ipilimumab  (3) q21d / Nivolumab  (480) q28d     12/17/2022 Imaging   MRI brain 1. Three small right hemisphere brain metastases metastases ranging from 2 mm to 4 mm. Minimal hemosiderin. No associated cerebral edema or mass effect. No other metastatic disease identified in the brain.   2. Known osseous metastatic disease but no destructive lesion identified in the skull or visible cervical spine.   3. Moderate for age cerebral white matter changes most commonly due to small vessel disease.   12/17/2022 Imaging   MRI brain 1. Three small right hemisphere brain metastases metastases ranging from 2 mm to 4 mm. Minimal hemosiderin. No associated cerebral edema or mass effect. No other metastatic disease identified in the brain.   2. Known osseous metastatic disease but no destructive lesion identified in the skull or visible cervical spine.   3. Moderate for age cerebral white matter changes most commonly due to small vessel disease.   02/15/2023 PET scan   PET whole body  1. The degree metastatic disease defined on recent CT scan is less impressive on FDG PET scan. 2. There is intense hypermetabolic lesion in the lateral segment of the LEFT hepatic lobe which corresponds to  a large 3.7 cm mass. Additional small hepatic lesions have mild metabolic activity. 3. Very low metabolic activity associated with a LEFT upper lobe pulmonary nodule which is decreased in size. Favor positive response to therapy. 4. Small LEFT hilar hypermetabolic node is indeterminate. 5.  Widespread lytic lesions throughout the axillary appendicular skeleton. Majority these lesions do not have hypermetabolic activity. One soft tissue lesion in the LEFT sacral ala does have mild metabolic activity.      PHYSICAL EXAMINATION: ECOG PERFORMANCE STATUS: {CHL ONC ECOG [phone]}  There were no vitals filed for this visit. There were no vitals filed for this visit.  GENERAL: alert, no distress and comfortable SKIN: skin color normal and no bruising or petechiae or jaundice on exposed skin EYES: normal, sclera clear OROPHARYNX: no exudate  NECK: No palpable mass LYMPH:  no palpable cervical, axillary lymphadenopathy  LUNGS: clear to auscultation and percussion with normal breathing effort HEART: regular rate & rhythm  ABDOMEN: abdomen soft, non-tender and nondistended. Musculoskeletal: no edema NEURO: no focal motor/sensory deficits  Relevant data reviewed during this visit included ***

## 2023-05-18 NOTE — Assessment & Plan Note (Addendum)
 LFT stable.  Repeat PET in June Continue encorafenib   450 mg once daily with binimetinib  45 mg twice daily. CBC, CMP, LDH every visit with follow up Continue nivolumab , can change to every 4 weeks dosage once stable

## 2023-05-18 NOTE — Assessment & Plan Note (Signed)
 Zometa every 4 weeks Calcium & vitamin D daily

## 2023-05-19 ENCOUNTER — Other Ambulatory Visit: Payer: Self-pay

## 2023-05-19 ENCOUNTER — Inpatient Hospital Stay: Payer: BC Managed Care – PPO

## 2023-05-19 DIAGNOSIS — C787 Secondary malignant neoplasm of liver and intrahepatic bile duct: Secondary | ICD-10-CM

## 2023-05-20 ENCOUNTER — Other Ambulatory Visit: Payer: Self-pay

## 2023-05-20 ENCOUNTER — Inpatient Hospital Stay

## 2023-05-20 ENCOUNTER — Inpatient Hospital Stay (HOSPITAL_BASED_OUTPATIENT_CLINIC_OR_DEPARTMENT_OTHER)

## 2023-05-20 ENCOUNTER — Other Ambulatory Visit (HOSPITAL_COMMUNITY): Payer: Self-pay

## 2023-05-20 ENCOUNTER — Telehealth: Payer: Self-pay

## 2023-05-20 VITALS — BP 103/78 | HR 107 | Temp 97.8°F | Resp 18 | Ht 62.0 in | Wt 101.7 lb

## 2023-05-20 DIAGNOSIS — Z5112 Encounter for antineoplastic immunotherapy: Secondary | ICD-10-CM | POA: Diagnosis not present

## 2023-05-20 DIAGNOSIS — C787 Secondary malignant neoplasm of liver and intrahepatic bile duct: Secondary | ICD-10-CM

## 2023-05-20 DIAGNOSIS — C7951 Secondary malignant neoplasm of bone: Secondary | ICD-10-CM | POA: Diagnosis not present

## 2023-05-20 DIAGNOSIS — C7931 Secondary malignant neoplasm of brain: Secondary | ICD-10-CM

## 2023-05-20 DIAGNOSIS — C7800 Secondary malignant neoplasm of unspecified lung: Secondary | ICD-10-CM | POA: Diagnosis not present

## 2023-05-20 DIAGNOSIS — H538 Other visual disturbances: Secondary | ICD-10-CM

## 2023-05-20 DIAGNOSIS — G4700 Insomnia, unspecified: Secondary | ICD-10-CM | POA: Diagnosis not present

## 2023-05-20 DIAGNOSIS — L659 Nonscarring hair loss, unspecified: Secondary | ICD-10-CM | POA: Diagnosis not present

## 2023-05-20 DIAGNOSIS — E860 Dehydration: Secondary | ICD-10-CM | POA: Diagnosis not present

## 2023-05-20 DIAGNOSIS — D520 Dietary folate deficiency anemia: Secondary | ICD-10-CM | POA: Diagnosis not present

## 2023-05-20 DIAGNOSIS — R5383 Other fatigue: Secondary | ICD-10-CM | POA: Insufficient documentation

## 2023-05-20 DIAGNOSIS — L299 Pruritus, unspecified: Secondary | ICD-10-CM | POA: Insufficient documentation

## 2023-05-20 DIAGNOSIS — R112 Nausea with vomiting, unspecified: Secondary | ICD-10-CM | POA: Diagnosis not present

## 2023-05-20 DIAGNOSIS — E876 Hypokalemia: Secondary | ICD-10-CM

## 2023-05-20 DIAGNOSIS — M8458XS Pathological fracture in neoplastic disease, other specified site, sequela: Secondary | ICD-10-CM | POA: Diagnosis not present

## 2023-05-20 DIAGNOSIS — C439 Malignant melanoma of skin, unspecified: Secondary | ICD-10-CM | POA: Diagnosis not present

## 2023-05-20 DIAGNOSIS — M1990 Unspecified osteoarthritis, unspecified site: Secondary | ICD-10-CM | POA: Diagnosis not present

## 2023-05-20 DIAGNOSIS — L640 Drug-induced androgenic alopecia: Secondary | ICD-10-CM

## 2023-05-20 DIAGNOSIS — G893 Neoplasm related pain (acute) (chronic): Secondary | ICD-10-CM | POA: Diagnosis not present

## 2023-05-20 DIAGNOSIS — I10 Essential (primary) hypertension: Secondary | ICD-10-CM | POA: Diagnosis not present

## 2023-05-20 DIAGNOSIS — R300 Dysuria: Secondary | ICD-10-CM | POA: Diagnosis not present

## 2023-05-20 LAB — CBC WITH DIFFERENTIAL (CANCER CENTER ONLY)
Abs Immature Granulocytes: 0.02 10*3/uL (ref 0.00–0.07)
Basophils Absolute: 0 10*3/uL (ref 0.0–0.1)
Basophils Relative: 1 %
Eosinophils Absolute: 0.2 10*3/uL (ref 0.0–0.5)
Eosinophils Relative: 4 %
HCT: 32.2 % — ABNORMAL LOW (ref 36.0–46.0)
Hemoglobin: 10.5 g/dL — ABNORMAL LOW (ref 12.0–15.0)
Immature Granulocytes: 0 %
Lymphocytes Relative: 24 %
Lymphs Abs: 1.3 10*3/uL (ref 0.7–4.0)
MCH: 30.6 pg (ref 26.0–34.0)
MCHC: 32.6 g/dL (ref 30.0–36.0)
MCV: 93.9 fL (ref 80.0–100.0)
Monocytes Absolute: 0.4 10*3/uL (ref 0.1–1.0)
Monocytes Relative: 7 %
Neutro Abs: 3.4 10*3/uL (ref 1.7–7.7)
Neutrophils Relative %: 64 %
Platelet Count: 349 10*3/uL (ref 150–400)
RBC: 3.43 MIL/uL — ABNORMAL LOW (ref 3.87–5.11)
RDW: 13.4 % (ref 11.5–15.5)
WBC Count: 5.3 10*3/uL (ref 4.0–10.5)
nRBC: 0 % (ref 0.0–0.2)

## 2023-05-20 LAB — T4, FREE: Free T4: 0.86 ng/dL (ref 0.61–1.12)

## 2023-05-20 LAB — CMP (CANCER CENTER ONLY)
ALT: 10 U/L (ref 0–44)
AST: 24 U/L (ref 15–41)
Albumin: 2.9 g/dL — ABNORMAL LOW (ref 3.5–5.0)
Alkaline Phosphatase: 106 U/L (ref 38–126)
Anion gap: 2 — ABNORMAL LOW (ref 5–15)
BUN: 10 mg/dL (ref 6–20)
CO2: 25 mmol/L (ref 22–32)
Calcium: 8.5 mg/dL — ABNORMAL LOW (ref 8.9–10.3)
Chloride: 110 mmol/L (ref 98–111)
Creatinine: 1.03 mg/dL — ABNORMAL HIGH (ref 0.44–1.00)
GFR, Estimated: >60 mL/min (ref >60)
Glucose, Bld: 125 mg/dL — ABNORMAL HIGH (ref 70–99)
Potassium: 4.3 mmol/L (ref 3.5–5.1)
Sodium: 137 mmol/L (ref 135–145)
Total Bilirubin: 0.2 mg/dL (ref 0.0–1.2)
Total Protein: 5.5 g/dL — ABNORMAL LOW (ref 6.5–8.1)

## 2023-05-20 LAB — LACTATE DEHYDROGENASE: LDH: 569 U/L — ABNORMAL HIGH (ref 98–192)

## 2023-05-20 LAB — TSH: TSH: 2.17 u[IU]/mL (ref 0.350–4.500)

## 2023-05-20 MED ORDER — SODIUM CHLORIDE 0.9% FLUSH
10.0000 mL | Freq: Once | INTRAVENOUS | Status: AC | PRN
Start: 2023-05-20 — End: 2023-05-20
  Administered 2023-05-20: 10 mL

## 2023-05-20 MED ORDER — SODIUM CHLORIDE 0.9 % IV SOLN
INTRAVENOUS | Status: DC
Start: 2023-05-20 — End: 2023-05-20

## 2023-05-20 MED ORDER — SODIUM CHLORIDE 0.9 % IV SOLN: INTRAVENOUS | Status: AC

## 2023-05-20 MED ORDER — POTASSIUM CHLORIDE 20 MEQ PO PACK
20.0000 meq | PACK | Freq: Two times a day (BID) | ORAL | 3 refills | Status: DC
  Filled 2023-05-20: qty 60, 30d supply, fill #0
  Filled 2023-05-31 – 2023-06-27 (×4): qty 60, 30d supply, fill #1

## 2023-05-20 NOTE — Assessment & Plan Note (Addendum)
 Trial of anti-itching moisturizer Hydroxyzine as needed 2 times daily

## 2023-05-20 NOTE — Assessment & Plan Note (Addendum)
 Continue to improve oral intake Due to nausea and vomiting IVF 1 L NS weekly.

## 2023-05-20 NOTE — Assessment & Plan Note (Signed)
 In the back. No rash Hydroxyzine twice daily as needed.

## 2023-05-20 NOTE — Assessment & Plan Note (Addendum)
 Potassium chloride  20 mEq twice daily. Will change to packet due to n/v

## 2023-05-20 NOTE — Patient Instructions (Signed)

## 2023-05-20 NOTE — Telephone Encounter (Signed)
 Pt given wig information and order for custom cranial prosthetic (medical wig) while in infusion today. Pt asks about ophthalmology referral that was placed by Dr. Alita Irwin a few weeks ago. TC to Dr. Basilio Both office to see if they have received the referral Dr. Alita Irwin placed on 4/3. Receptionist states they do not receive referrals through Epic. Pt's demographic sheet and Dr. Joseph Nickel OV note from 04/29/23 faxed to [facility] at [phone]. Pt notified.

## 2023-05-20 NOTE — Assessment & Plan Note (Addendum)
 Mostly with pills Continue olanzapine  nightly Continue scopolamine  patch. Compazine  and zofran  and compazine  as needed Continue pepcid .

## 2023-05-20 NOTE — Assessment & Plan Note (Addendum)
 3 days of more fatigue on higher dose of Nivo. Resolved Continue monitor for toxicity.  Increase oral intake and fluid

## 2023-05-24 ENCOUNTER — Other Ambulatory Visit (HOSPITAL_COMMUNITY): Payer: Self-pay

## 2023-05-25 ENCOUNTER — Ambulatory Visit: Payer: BC Managed Care – PPO

## 2023-05-25 ENCOUNTER — Other Ambulatory Visit: Payer: BC Managed Care – PPO

## 2023-05-25 ENCOUNTER — Other Ambulatory Visit: Payer: Self-pay

## 2023-05-25 ENCOUNTER — Other Ambulatory Visit (HOSPITAL_COMMUNITY): Payer: Self-pay

## 2023-05-25 DIAGNOSIS — R21 Rash and other nonspecific skin eruption: Secondary | ICD-10-CM

## 2023-05-25 MED ORDER — IBUPROFEN 600 MG PO TABS
600.0000 mg | ORAL_TABLET | Freq: Four times a day (QID) | ORAL | 0 refills | Status: DC | PRN
  Filled 2023-05-25: qty 30, 8d supply, fill #0

## 2023-05-25 MED ORDER — TRIAMCINOLONE ACETONIDE 0.5 % EX OINT
1.0000 | TOPICAL_OINTMENT | Freq: Two times a day (BID) | CUTANEOUS | 0 refills | Status: DC
  Filled 2023-05-25: qty 30, 15d supply, fill #0

## 2023-05-25 NOTE — Progress Notes (Signed)
 Kenalog ointment sent [facility] for rash.

## 2023-05-27 ENCOUNTER — Inpatient Hospital Stay: Attending: Physician Assistant

## 2023-05-27 ENCOUNTER — Other Ambulatory Visit: Payer: Self-pay

## 2023-05-27 ENCOUNTER — Encounter (HOSPITAL_COMMUNITY): Payer: Self-pay | Admitting: Emergency Medicine

## 2023-05-27 ENCOUNTER — Ambulatory Visit: Payer: BC Managed Care – PPO

## 2023-05-27 ENCOUNTER — Emergency Department (HOSPITAL_COMMUNITY)

## 2023-05-27 ENCOUNTER — Other Ambulatory Visit: Payer: BC Managed Care – PPO

## 2023-05-27 ENCOUNTER — Emergency Department (HOSPITAL_COMMUNITY)
Admission: EM | Admit: 2023-05-27 | Discharge: 2023-05-27 | Disposition: A | Discharge status: Home / Self Care | Attending: Emergency Medicine | Admitting: Emergency Medicine

## 2023-05-27 VITALS — BP 99/70 | HR 122 | Temp 98.2°F | Resp 16

## 2023-05-27 DIAGNOSIS — I10 Essential (primary) hypertension: Secondary | ICD-10-CM | POA: Insufficient documentation

## 2023-05-27 DIAGNOSIS — R112 Nausea with vomiting, unspecified: Secondary | ICD-10-CM | POA: Insufficient documentation

## 2023-05-27 DIAGNOSIS — R531 Weakness: Secondary | ICD-10-CM | POA: Diagnosis not present

## 2023-05-27 DIAGNOSIS — R1084 Generalized abdominal pain: Secondary | ICD-10-CM | POA: Diagnosis not present

## 2023-05-27 DIAGNOSIS — R52 Pain, unspecified: Secondary | ICD-10-CM

## 2023-05-27 DIAGNOSIS — M549 Dorsalgia, unspecified: Secondary | ICD-10-CM | POA: Insufficient documentation

## 2023-05-27 DIAGNOSIS — C7800 Secondary malignant neoplasm of unspecified lung: Secondary | ICD-10-CM | POA: Insufficient documentation

## 2023-05-27 DIAGNOSIS — E860 Dehydration: Secondary | ICD-10-CM | POA: Insufficient documentation

## 2023-05-27 DIAGNOSIS — Z79620 Long term (current) use of immunosuppressive biologic: Secondary | ICD-10-CM | POA: Insufficient documentation

## 2023-05-27 DIAGNOSIS — R000 Tachycardia, unspecified: Secondary | ICD-10-CM | POA: Diagnosis not present

## 2023-05-27 DIAGNOSIS — R109 Unspecified abdominal pain: Secondary | ICD-10-CM | POA: Insufficient documentation

## 2023-05-27 DIAGNOSIS — R110 Nausea: Secondary | ICD-10-CM

## 2023-05-27 DIAGNOSIS — C7931 Secondary malignant neoplasm of brain: Secondary | ICD-10-CM | POA: Insufficient documentation

## 2023-05-27 DIAGNOSIS — Z5112 Encounter for antineoplastic immunotherapy: Secondary | ICD-10-CM | POA: Insufficient documentation

## 2023-05-27 DIAGNOSIS — R1011 Right upper quadrant pain: Secondary | ICD-10-CM | POA: Diagnosis not present

## 2023-05-27 DIAGNOSIS — G893 Neoplasm related pain (acute) (chronic): Secondary | ICD-10-CM | POA: Insufficient documentation

## 2023-05-27 DIAGNOSIS — Z515 Encounter for palliative care: Secondary | ICD-10-CM | POA: Insufficient documentation

## 2023-05-27 DIAGNOSIS — C787 Secondary malignant neoplasm of liver and intrahepatic bile duct: Secondary | ICD-10-CM | POA: Insufficient documentation

## 2023-05-27 DIAGNOSIS — R197 Diarrhea, unspecified: Secondary | ICD-10-CM | POA: Insufficient documentation

## 2023-05-27 DIAGNOSIS — N289 Disorder of kidney and ureter, unspecified: Secondary | ICD-10-CM | POA: Insufficient documentation

## 2023-05-27 DIAGNOSIS — M1990 Unspecified osteoarthritis, unspecified site: Secondary | ICD-10-CM | POA: Insufficient documentation

## 2023-05-27 DIAGNOSIS — C7951 Secondary malignant neoplasm of bone: Secondary | ICD-10-CM | POA: Diagnosis not present

## 2023-05-27 DIAGNOSIS — K5900 Constipation, unspecified: Secondary | ICD-10-CM | POA: Insufficient documentation

## 2023-05-27 DIAGNOSIS — C799 Secondary malignant neoplasm of unspecified site: Secondary | ICD-10-CM | POA: Diagnosis not present

## 2023-05-27 DIAGNOSIS — C439 Malignant melanoma of skin, unspecified: Secondary | ICD-10-CM | POA: Insufficient documentation

## 2023-05-27 DIAGNOSIS — D649 Anemia, unspecified: Secondary | ICD-10-CM | POA: Insufficient documentation

## 2023-05-27 DIAGNOSIS — C7889 Secondary malignant neoplasm of other digestive organs: Secondary | ICD-10-CM | POA: Diagnosis not present

## 2023-05-27 LAB — COMPREHENSIVE METABOLIC PANEL WITH GFR
ALT: 12 U/L (ref 0–44)
AST: 30 U/L (ref 15–41)
Albumin: 2.1 g/dL — ABNORMAL LOW (ref 3.5–5.0)
Alkaline Phosphatase: 72 U/L (ref 38–126)
Anion gap: 5 (ref 5–15)
BUN: 12 mg/dL (ref 6–20)
CO2: 19 mmol/L — ABNORMAL LOW (ref 22–32)
Calcium: 7.3 mg/dL — ABNORMAL LOW (ref 8.9–10.3)
Chloride: 112 mmol/L — ABNORMAL HIGH (ref 98–111)
Creatinine, Ser: 1.14 mg/dL — ABNORMAL HIGH (ref 0.44–1.00)
GFR, Estimated: 57 mL/min — ABNORMAL LOW (ref >60)
Glucose, Bld: 139 mg/dL — ABNORMAL HIGH (ref 70–99)
Potassium: 3.7 mmol/L (ref 3.5–5.1)
Sodium: 136 mmol/L (ref 135–145)
Total Bilirubin: 0.4 mg/dL (ref 0.0–1.2)
Total Protein: 4.5 g/dL — ABNORMAL LOW (ref 6.5–8.1)

## 2023-05-27 LAB — CBC WITH DIFFERENTIAL/PLATELET
Abs Immature Granulocytes: 0.02 10*3/uL (ref 0.00–0.07)
Basophils Absolute: 0 10*3/uL (ref 0.0–0.1)
Basophils Relative: 1 %
Eosinophils Absolute: 0.1 10*3/uL (ref 0.0–0.5)
Eosinophils Relative: 2 %
HCT: 25.3 % — ABNORMAL LOW (ref 36.0–46.0)
Hemoglobin: 8 g/dL — ABNORMAL LOW (ref 12.0–15.0)
Immature Granulocytes: 1 %
Lymphocytes Relative: 31 %
Lymphs Abs: 1.3 10*3/uL (ref 0.7–4.0)
MCH: 30.5 pg (ref 26.0–34.0)
MCHC: 31.6 g/dL (ref 30.0–36.0)
MCV: 96.6 fL (ref 80.0–100.0)
Monocytes Absolute: 0.2 10*3/uL (ref 0.1–1.0)
Monocytes Relative: 5 %
Neutro Abs: 2.5 10*3/uL (ref 1.7–7.7)
Neutrophils Relative %: 60 %
Platelets: 286 10*3/uL (ref 150–400)
RBC: 2.62 MIL/uL — ABNORMAL LOW (ref 3.87–5.11)
RDW: 13.4 % (ref 11.5–15.5)
WBC: 4.1 10*3/uL (ref 4.0–10.5)
nRBC: 0 % (ref 0.0–0.2)

## 2023-05-27 LAB — LIPASE, BLOOD: Lipase: 36 U/L (ref 11–51)

## 2023-05-27 MED ORDER — HEPARIN SOD (PORK) LOCK FLUSH 100 UNIT/ML IV SOLN
500.0000 [IU] | Freq: Once | INTRAVENOUS | Status: AC | PRN
  Administered 2023-05-27: 500 [IU]

## 2023-05-27 MED ORDER — IOHEXOL 300 MG/ML  SOLN
100.0000 mL | Freq: Once | INTRAMUSCULAR | Status: AC | PRN
  Administered 2023-05-27: 80 mL via INTRAVENOUS

## 2023-05-27 MED ORDER — SODIUM CHLORIDE 0.9 % IV SOLN: INTRAVENOUS | Status: AC

## 2023-05-27 MED ORDER — SODIUM CHLORIDE 0.9 % IV BOLUS
1000.0000 mL | Freq: Once | INTRAVENOUS | Status: AC
  Administered 2023-05-27: 1000 mL via INTRAVENOUS

## 2023-05-27 MED ORDER — MORPHINE SULFATE (PF) 2 MG/ML IV SOLN
2.0000 mg | Freq: Once | INTRAVENOUS | Status: AC
  Administered 2023-05-27: 2 mg via INTRAVENOUS
  Filled 2023-05-27: qty 1

## 2023-05-27 MED ORDER — ONDANSETRON HCL 4 MG/2ML IJ SOLN
INTRAMUSCULAR | Status: AC
  Administered 2023-05-27: 4 mg via INTRAVENOUS
  Filled 2023-05-27: qty 2

## 2023-05-27 MED ORDER — FAMOTIDINE IN NACL 20-0.9 MG/50ML-% IV SOLN
20.0000 mg | Freq: Once | INTRAVENOUS | Status: AC
  Administered 2023-05-27: 20 mg via INTRAVENOUS
  Filled 2023-05-27: qty 50

## 2023-05-27 MED ORDER — SODIUM CHLORIDE 0.9 % IV SOLN: INTRAVENOUS | Status: DC

## 2023-05-27 MED ORDER — SODIUM CHLORIDE 0.9% FLUSH
10.0000 mL | Freq: Once | INTRAVENOUS | Status: AC | PRN
  Administered 2023-05-27: 10 mL

## 2023-05-27 MED ORDER — ONDANSETRON HCL 4 MG/2ML IJ SOLN: 4.0000 mg | Freq: Once | INTRAMUSCULAR | Status: AC

## 2023-05-27 MED ORDER — PROCHLORPERAZINE EDISYLATE 10 MG/2ML IJ SOLN
8.0000 mg | Freq: Once | INTRAMUSCULAR | Status: AC
  Administered 2023-05-27: 8 mg via INTRAVENOUS
  Filled 2023-05-27: qty 2

## 2023-05-27 MED ORDER — HEPARIN SOD (PORK) LOCK FLUSH 100 UNIT/ML IV SOLN
500.0000 [IU] | Freq: Once | INTRAVENOUS | Status: AC
  Administered 2023-05-27: 500 [IU]
  Filled 2023-05-27: qty 5

## 2023-05-27 NOTE — Discharge Instructions (Signed)
 Please be sure to follow-up with your oncology team tomorrow via telephone.  Return here for concerning changes in your condition.

## 2023-05-27 NOTE — ED Provider Notes (Signed)
 [place] EMERGENCY DEPARTMENT AT [facility] Provider Note   CSN: [id number] Arrival date & time: 05/27/23  1144     History  Chief Complaint  Patient presents with   Abdominal Pain    Laura [name] is a 58 y.o. female.  HPI Patient presents with concern for pain across her entire thoracoabdominal area, back and weakness. Patient is receiving therapy for metastatic cancer.  Today she had an infusion, notes that soon after discharge felt weak, pain in the pharyngeal areas.  She knows there is metastatic disease in her back, liver.  No syncope, no fever, no vomiting.  Patient has a fentanyl  patch, complains of pain beyond her baseline. She was subsequently a code by family members to assist with the history, corroborate details.    Home Medications Prior to Admission medications   Medication Sig Start Date End Date Taking? Authorizing Provider  binimetinib  (MEKTOVI ) 15 MG tablet Take 3 tablets (45 mg total) by mouth 2 (two) times daily. 03/16/23   [name], MD  calcium  carbonate (OS-CAL - DOSED IN MG OF ELEMENTAL CALCIUM ) 1250 (500 Ca) MG tablet Take 1 tablet by mouth daily at 2 PM. Pt takes w/ Vitamin D    [provider]  docusate sodium  (COLACE) 100 MG capsule Take 100-200 mg by mouth daily.    [provider]  dronabinol  (MARINOL ) 10 MG capsule Take 1 capsule (10 mg total) by mouth 3 (three) times daily. 05/07/23   Pickenpack-Cousar, Giles Labrum, NP  encorafenib  (BRAFTOVI ) 75 MG capsule Take 6 capsules (450 mg total) by mouth daily. 03/16/23   [name], MD  famotidine  (PEPCID ) 20 MG tablet Take 20 mg by mouth 2 (two) times daily.    [provider]  fentaNYL  (DURAGESIC ) 25 MCG/HR Place 1 patch onto the skin every 3 (three) days. 05/10/23   Pickenpack-Cousar, Athena N, NP  gabapentin  (NEURONTIN ) 100 MG capsule Take 1 capsule (100 mg total) by mouth 2 (two) times daily. 05/07/23   [name], MD  glycopyrrolate  (ROBINUL ) 1 MG  tablet Take 1 tablet (1 mg total) by mouth 2 (two) times daily as needed. 03/16/23   Pickenpack-Cousar, Athena N, NP  hydrOXYzine (ATARAX) 25 MG tablet Take 25 mg by mouth daily. 04/14/23   [provider]  ibuprofen  (ADVIL ) 600 MG tablet Take 1 tablet (600 mg total) by mouth every 6 (six) hours as needed for fever, headache or mild pain 05/25/23     lidocaine -prilocaine  (EMLA ) cream Apply to port site 30-45 min prior to appointment. 03/16/23   [name], MD  OLANZapine  (ZYPREXA ) 2.5 MG tablet Take 1 tablet (2.5 mg total) by mouth at bedtime. 03/30/23   [name], MD  ondansetron  (ZOFRAN -ODT) 8 MG disintegrating tablet Dissolve 1 tablet (8 mg total) by mouth every 8 (eight) hours as needed for nausea or vomiting. 04/01/23   [name], MD  potassium chloride  (KLOR-CON ) 20 MEQ packet Mix 1 packet (20 mEq) and take by mouth 2 (two) times daily as directed. 05/20/23   [name], MD  prochlorperazine  (COMPAZINE ) 10 MG tablet Take 1 tablet (10 mg total) by mouth every 6 (six) hours as needed for nausea or vomiting. 04/27/23   Pickenpack-Cousar, Athena N, NP  rosuvastatin  (CRESTOR ) 10 MG tablet Take 10 mg by mouth at bedtime.    [provider]  scopolamine  (TRANSDERM-SCOP) 1 MG/3DAYS Place 1 patch (1.5 mg total) onto the skin every 3 (three) days. 04/15/23  [name], MD  traMADol  (ULTRAM ) 50 MG tablet Take 1-2 tablets (50-100 mg total) by mouth every 6 (six) hours as needed for moderate pain (pain score 4-6) or severe pain (pain score 7-10). 05/07/23   Pickenpack-Cousar, Athena N, NP  triamcinolone  ointment (KENALOG ) 0.5 % Apply topically to affected area twice daily 05/25/23   [name], MD  zolpidem  (AMBIEN  CR) 12.5 MG CR tablet Take 1 tablet (12.5 mg total) by mouth at bedtime as needed for sleep. 04/29/23 04/28/24  [name], MD      Allergies    Aspirin and Tylenol  [acetaminophen ]    Review of Systems   Review of Systems  Physical Exam Updated Vital  Signs BP 103/72 (BP Location: Right Arm)   Pulse (!) 108   Temp 98.2 F (36.8 C) (Oral)   Resp 16   Ht 5\' 2"  (1.575 m)   Wt 45.8 kg   SpO2 98%   BMI 18.47 kg/m  Physical Exam Vitals and nursing note reviewed.  Constitutional:      General: She is not in acute distress.    Appearance: She is well-developed.  HENT:     Head: Normocephalic and atraumatic.  Eyes:     Conjunctiva/sclera: Conjunctivae normal.  Cardiovascular:     Rate and Rhythm: Regular rhythm. Tachycardia present.  Pulmonary:     Effort: Pulmonary effort is normal. No respiratory distress.     Breath sounds: Normal breath sounds. No stridor.  Abdominal:     General: There is no distension.  Skin:    General: Skin is warm and dry.  Neurological:     Mental Status: She is alert and oriented to person, place, and time.     Cranial Nerves: No cranial nerve deficit.  Psychiatric:        Mood and Affect: Mood normal.     ED Results / Procedures / Treatments   Labs (all labs ordered are listed, but only abnormal results are displayed) Labs Reviewed  COMPREHENSIVE METABOLIC PANEL WITH GFR - Abnormal; Notable for the following components:      Result Value   Chloride 112 (*)    CO2 19 (*)    Glucose, Bld 139 (*)    Creatinine, Ser 1.14 (*)    Calcium  7.3 (*)    Total Protein 4.5 (*)    Albumin 2.1 (*)    GFR, Estimated 57 (*)    All other components within normal limits  CBC WITH DIFFERENTIAL/PLATELET - Abnormal; Notable for the following components:   RBC 2.62 (*)    Hemoglobin 8.0 (*)    HCT 25.3 (*)    All other components within normal limits  LIPASE, BLOOD    EKG None  Radiology CT L-SPINE NO CHARGE Result Date: 05/27/2023 CLINICAL DATA:  Right upper quadrant abdominal pain. Cancer patient. Chemotherapy 2 weeks ago. Nausea and vomiting. EXAM: CT Lumbar Spine with contrast TECHNIQUE: Technique: Multiplanar CT images of the lumbar spine were reconstructed from contemporary CT of the Abdomen and  Pelvis. RADIATION DOSE REDUCTION: This exam was performed according to the departmental dose-optimization program which includes automated exposure control, adjustment of the mA and/or kV according to patient size and/or use of iterative reconstruction technique. CONTRAST:  No additional IV contrast material was administered for this reconstructed examination. COMPARISON:  CT abdomen and pelvis 04/06/2023 FINDINGS: Segmentation: 5 lumbar type vertebral bodies. Alignment: Lumbar scoliosis convex towards the right. No anterior subluxations. Vertebrae: Lucent and sclerotic changes throughout the lumbar vertebrae  consistent with diffuse metastatic disease and similar to prior study. Multiple vertebral compression fractures. L5 vertebra demonstrates fragmentation with about 50-70% loss of height consistent with pathologic fracture. No change. Focal compression of the superior endplate of L4 without change consistent with pathologic fracture. Minimal depression fracture of the inferior endplate of L2 consistent with pathologic fracture, unchanged. Diffuse compression and fragmentation of L1 with about 50-70% loss of height. No change. Compression fractures also identified at the T8 and T9 vertebra, not included in the prior study. Mild retropulsion of fracture fragments at L1 and L5. Metastatic lesions demonstrated in the posterior elements and in the visualized sacrum and pelvis. Paraspinal and other soft tissues: No abnormal paraspinal soft tissue mass or infiltration. Disc levels: Degenerative changes throughout with mild disc space narrowing and endplate osteophyte formation. Degenerative changes in the facet joints. IMPRESSION: 1. Diffuse metastatic disease throughout the lumbar spine with multiple pathologic compression fractures as above. 2. Underlying degenerative changes. 3. No significant change since prior study. Electronically Signed   By: Boyce Byes M.D.   On: 05/27/2023 15:57   CT ABDOMEN PELVIS W  CONTRAST Result Date: 05/27/2023 CLINICAL DATA:  Acute nonlocalized abdominal pain. Right upper quadrant abdominal pain. Chemotherapy 2 weeks ago for cancer. Nausea and vomiting for 3 days. EXAM: CT ABDOMEN AND PELVIS WITH CONTRAST TECHNIQUE: Multidetector CT imaging of the abdomen and pelvis was performed using the standard protocol following bolus administration of intravenous contrast. RADIATION DOSE REDUCTION: This exam was performed according to the departmental dose-optimization program which includes automated exposure control, adjustment of the mA and/or kV according to patient size and/or use of iterative reconstruction technique. CONTRAST:  80mL OMNIPAQUE  IOHEXOL  300 MG/ML  SOLN COMPARISON:  PET-CT 04/29/2023.  CT abdomen and pelvis 04/06/2023 FINDINGS: Lower chest: Atelectasis or infiltration in the lung bases. Nodules in the left base, largest measuring 7 mm diameter. Series 5, image 26. Nodules are similar to prior study. Hepatobiliary: Numerous hypoenhancing liver metastases. Largest is in segment 2 of the left lobe of the liver measuring 3.7 cm diameter. Liver lesions appear to have progressed in size and conspicuity since the previous study. Surgical absence of the gallbladder. No bile duct dilatation. Pancreas: Unremarkable. No pancreatic ductal dilatation or surrounding inflammatory changes. Spleen: Focal low-attenuation lesion in the anterior spleen measuring 1.3 cm diameter. This is enlarged since prior study, worrisome for metastatic disease. Adrenals/Urinary Tract: Adrenal glands are unremarkable. Kidneys are normal, without renal calculi, focal lesion, or hydronephrosis. Bladder is unremarkable. Stomach/Bowel: Stomach is decompressed. There is evidence of edematous wall thickening in the pylorus and duodenal bulb region possibly representing peptic ulcer disease. The small bowel and colon are not abnormally distended. No wall thickening or inflammatory changes. Stool fills the colon. Appendix  is not identified. Vascular/Lymphatic: Aortic atherosclerosis. No enlarged abdominal or pelvic lymph nodes. Reproductive: Status post hysterectomy. No adnexal masses. Other: Small to moderate free fluid throughout the abdomen and pelvis, progressing since prior study. This may indicate ascites or peritoneal neoplastic spread. No free air. Abdominal wall musculature appears intact. Musculoskeletal: Degenerative changes in the spine. Multiple lucent bone lesions with multiple compression fractures consistent with diffuse metastatic disease and pathologic compression fractures. Compression fractures are demonstrated at L5, L2, L1, and T8 and T9. Compression deformities appear similar to prior study. IMPRESSION: 1. Multiple hepatic metastases demonstrating progression since prior study. 2. Focal lesion in the spleen is enlarged since prior study, worrisome for metastatic disease. 3. Progressing abdominal ascites may indicate liver dysfunction or peritoneal metastatic disease.  4. Diffuse bone metastasis with multiple vertebral compression fractures, unchanged. 5. Aortic atherosclerosis. 6. Wall thickening and edema of the pyloric region of the stomach and duodenal bulb suggesting peptic ulcer disease or gastroduodenitis. 7. Atelectasis or infiltration in the lung bases. 8. Lung base nodules measuring up to 7 mm without change. These may be metastatic. Electronically Signed   By: Boyce Byes M.D.   On: 05/27/2023 15:50   DG Chest 2 View Result Date: 05/27/2023 CLINICAL DATA:  Hypertension. EXAM: CHEST - 2 VIEW COMPARISON:  April 06, 2023. FINDINGS: The heart size and mediastinal contours are within normal limits. Both lungs are clear. Right internal jugular Port-A-Cath is unchanged. Old midthoracic compression fracture is noted. IMPRESSION: No active cardiopulmonary disease. Electronically Signed   By: Rosalene Colon M.D.   On: 05/27/2023 14:05    Procedures Procedures    Medications Ordered in  ED Medications  0.9 %  sodium chloride  infusion ( Intravenous New Bag/Given 05/27/23 1234)  famotidine  (PEPCID ) IVPB 20 mg premix (has no administration in time range)  ondansetron  (ZOFRAN ) injection 4 mg (4 mg Intravenous Given 05/27/23 1350)  iohexol  (OMNIPAQUE ) 300 MG/ML solution 100 mL (80 mLs Intravenous Contrast Given 05/27/23 1428)  morphine  (PF) 2 MG/ML injection 2 mg (2 mg Intravenous Given 05/27/23 1520)  sodium chloride  0.9 % bolus 1,000 mL (1,000 mLs Intravenous New Bag/Given 05/27/23 1620)    ED Course/ Medical Decision Making/ A&P                                 Medical Decision Making Adult female with metastatic cancer presents with pain in multiple areas, weakness same day as receiving infusion therapy.  Broad differential including infusion reaction, dehydration, infection, bacteremia, sepsis, pneumonia, progression of disease. Labs CT x-ray all started, analgesia offered, initially declined then excepted. Cardiac 105 sinus tach abnormal  Pulse ox 100% room air normal  Amount and/or Complexity of Data Reviewed Independent Historian: spouse External Data Reviewed: notes. Labs: ordered. Decision-making details documented in ED Course. Radiology: ordered and independent interpretation performed. Decision-making details documented in ED Course.  Risk Decision regarding hospitalization. Diagnosis or treatment significantly limited by social determinants of health.  Update: Patient accompanied by multiple family members 4:44 PM All results available and reviewed the CT, x-ray, labs.  Findings concerning for progression of disease, no obvious evidence for pneumonia, the patient is afebrile, without cough or shortness of breath.  She does have tachycardia, mild hypotension, is concerning, consistent with dehydration.  No evidence for bacteremia, sepsis, given concern for ongoing pain, weakness, progression of disease, we discussed admission versus discharge, the patient has a  preference for the latter, will follow-up with her oncology team tomorrow via telephone.        Final Clinical Impression(s) / ED Diagnoses Final diagnoses:  Pain  Dehydration    Rx / DC Orders ED Discharge Orders     None         Dorenda Gandy, MD 05/27/23 1644

## 2023-05-27 NOTE — ED Triage Notes (Addendum)
 Pt bib [facility] for right upper quadrant abdominal pain. She did have fluids today at cancer center Chemo 2 weeks ago. Nausea and vomiting the last three days.

## 2023-05-27 NOTE — Patient Instructions (Signed)

## 2023-05-28 ENCOUNTER — Other Ambulatory Visit: Payer: Self-pay

## 2023-05-28 ENCOUNTER — Telehealth: Payer: Self-pay

## 2023-05-28 DIAGNOSIS — C787 Secondary malignant neoplasm of liver and intrahepatic bile duct: Secondary | ICD-10-CM

## 2023-05-28 DIAGNOSIS — R110 Nausea: Secondary | ICD-10-CM

## 2023-05-28 NOTE — Telephone Encounter (Signed)
 Laura [name] scheduled Laura [name]'s appointment, and they are aware of all appointment details.

## 2023-05-31 ENCOUNTER — Other Ambulatory Visit: Payer: Self-pay | Admitting: Nurse Practitioner

## 2023-05-31 ENCOUNTER — Other Ambulatory Visit (HOSPITAL_COMMUNITY): Payer: Self-pay

## 2023-05-31 ENCOUNTER — Inpatient Hospital Stay (HOSPITAL_BASED_OUTPATIENT_CLINIC_OR_DEPARTMENT_OTHER)

## 2023-05-31 ENCOUNTER — Ambulatory Visit (HOSPITAL_BASED_OUTPATIENT_CLINIC_OR_DEPARTMENT_OTHER): Admitting: Nurse Practitioner

## 2023-05-31 ENCOUNTER — Encounter: Payer: Self-pay | Admitting: Nurse Practitioner

## 2023-05-31 ENCOUNTER — Other Ambulatory Visit: Payer: Self-pay

## 2023-05-31 VITALS — BP 105/75 | HR 120 | Temp 97.4°F | Resp 18 | Ht 62.0 in | Wt 97.1 lb

## 2023-05-31 DIAGNOSIS — D649 Anemia, unspecified: Secondary | ICD-10-CM

## 2023-05-31 DIAGNOSIS — Z515 Encounter for palliative care: Secondary | ICD-10-CM

## 2023-05-31 DIAGNOSIS — R0602 Shortness of breath: Secondary | ICD-10-CM | POA: Diagnosis not present

## 2023-05-31 DIAGNOSIS — C439 Malignant melanoma of skin, unspecified: Secondary | ICD-10-CM | POA: Diagnosis not present

## 2023-05-31 DIAGNOSIS — R634 Abnormal weight loss: Secondary | ICD-10-CM

## 2023-05-31 DIAGNOSIS — C7951 Secondary malignant neoplasm of bone: Secondary | ICD-10-CM

## 2023-05-31 DIAGNOSIS — C7800 Secondary malignant neoplasm of unspecified lung: Secondary | ICD-10-CM | POA: Diagnosis not present

## 2023-05-31 DIAGNOSIS — Z79620 Long term (current) use of immunosuppressive biologic: Secondary | ICD-10-CM | POA: Diagnosis not present

## 2023-05-31 DIAGNOSIS — R630 Anorexia: Secondary | ICD-10-CM

## 2023-05-31 DIAGNOSIS — Z9189 Other specified personal risk factors, not elsewhere classified: Secondary | ICD-10-CM

## 2023-05-31 DIAGNOSIS — C787 Secondary malignant neoplasm of liver and intrahepatic bile duct: Secondary | ICD-10-CM

## 2023-05-31 DIAGNOSIS — R197 Diarrhea, unspecified: Secondary | ICD-10-CM | POA: Diagnosis not present

## 2023-05-31 DIAGNOSIS — R110 Nausea: Secondary | ICD-10-CM

## 2023-05-31 DIAGNOSIS — G893 Neoplasm related pain (acute) (chronic): Secondary | ICD-10-CM

## 2023-05-31 DIAGNOSIS — Z5112 Encounter for antineoplastic immunotherapy: Secondary | ICD-10-CM | POA: Diagnosis not present

## 2023-05-31 DIAGNOSIS — N289 Disorder of kidney and ureter, unspecified: Secondary | ICD-10-CM

## 2023-05-31 DIAGNOSIS — R530 Neoplastic (malignant) related fatigue: Secondary | ICD-10-CM

## 2023-05-31 DIAGNOSIS — M1990 Unspecified osteoarthritis, unspecified site: Secondary | ICD-10-CM | POA: Diagnosis not present

## 2023-05-31 DIAGNOSIS — R112 Nausea with vomiting, unspecified: Secondary | ICD-10-CM | POA: Diagnosis not present

## 2023-05-31 DIAGNOSIS — C7931 Secondary malignant neoplasm of brain: Secondary | ICD-10-CM | POA: Diagnosis not present

## 2023-05-31 DIAGNOSIS — I10 Essential (primary) hypertension: Secondary | ICD-10-CM | POA: Diagnosis not present

## 2023-05-31 DIAGNOSIS — K5900 Constipation, unspecified: Secondary | ICD-10-CM | POA: Diagnosis not present

## 2023-05-31 MED ORDER — GABAPENTIN 100 MG PO CAPS
100.0000 mg | ORAL_CAPSULE | Freq: Two times a day (BID) | ORAL | 0 refills | Status: DC
  Filled 2023-05-31 – 2023-06-09 (×2): qty 60, 30d supply, fill #0

## 2023-05-31 MED ORDER — DRONABINOL 10 MG PO CAPS
10.0000 mg | ORAL_CAPSULE | Freq: Three times a day (TID) | ORAL | 0 refills | Status: DC
  Filled 2023-05-31 – 2023-06-09 (×2): qty 90, 30d supply, fill #0

## 2023-05-31 MED ORDER — PROCHLORPERAZINE MALEATE 10 MG PO TABS
10.0000 mg | ORAL_TABLET | Freq: Four times a day (QID) | ORAL | 2 refills | Status: DC | PRN
  Filled 2023-05-31 – 2023-06-01 (×2): qty 30, 8d supply, fill #0
  Filled 2023-06-09: qty 30, 8d supply, fill #1
  Filled 2023-07-05: qty 30, 8d supply, fill #2

## 2023-05-31 MED ORDER — OLANZAPINE 2.5 MG PO TABS
2.5000 mg | ORAL_TABLET | Freq: Every day | ORAL | 1 refills | Status: DC
  Filled 2023-05-31 – 2023-07-02 (×6): qty 30, 30d supply, fill #0

## 2023-05-31 MED ORDER — TRAMADOL HCL 50 MG PO TABS
50.0000 mg | ORAL_TABLET | Freq: Four times a day (QID) | ORAL | 0 refills | Status: DC | PRN
  Filled 2023-05-31: qty 90, 12d supply, fill #0

## 2023-05-31 MED ORDER — FENTANYL 25 MCG/HR TD PT72
1.0000 | MEDICATED_PATCH | TRANSDERMAL | 0 refills | Status: DC
  Filled 2023-05-31 – 2023-06-10 (×4): qty 10, 30d supply, fill #0

## 2023-05-31 NOTE — Assessment & Plan Note (Addendum)
 Monitor LFTs before treatment Last ECG in March  Monitor for diarrhea, nausea, rash, MSK pain, visual changes Monitor for irAEs

## 2023-05-31 NOTE — Progress Notes (Signed)
 [facility] OFFICE PROGRESS NOTE  Patient Care Team: Darnelle Elders, [name] as PCP - General (Family Medicine) Pickenpack-Cousar, Giles Labrum, NP as Nurse Practitioner [facility] and Palliative Medicine)  Diagnosis: 11/25/2022. Diffuse liver, bone, lung metastases. Brain metastases after starting systemic treatment.  Rapidly rising liver enzymes, LDH with profound disease burden. Treatment: 12/11/2022. First line ipi/nivo. Completed 2 cycles.  Admitted for worsening LFT, large amount of metastases in the liver. 12/14/22-12/28/22 radiation to L-spine 30Gy/96fx Needed rapid response due to systemic disease burden 01/09/23  vemurafenib  and cobimetinib due to disease uncontrolled or worsening symptoms, liver metastases. 03/24/23 switched to Braftovi  and Mektovi  due to suboptimal CNS response.   04/07/23 MRI of the brain showed interval response in some area of CNS metastases and stable disease in others.  New CT concerning for progression. Clinically feeling better most days. "Walking into the clinic today" without wheelchair. Discussed CT results. Follow up PET whole body. Discuss relatlimab-nivo can be an alternative option.  Assessment & Plan Melanoma metastatic to liver (HCC) LFT stable.  CT from ED showed progression. Discuss results and considering change to relatlimab-nivo if PET confirms progression. Continue encorafenib   450 mg once daily with binimetinib  45 mg twice daily. PET in early June CBC, CMP, LDH every visit with follow up  Normocytic anemia Worsening last week Will repeat folate, check b12 Renal insufficiency Stable Continue to increase fluid intake Decreased appetite Continues Marinol  to 10 mg twice daily.  Nausea and vomiting, unspecified vomiting type Continue olanzapine  nightly Continue scopolamine  patch. Compazine  and zofran  and compazine  as needed Continue pepcid  twice daily. At risk for side effect of medication Monitor LFTs before treatment Last  ECG in March  Monitor for diarrhea, nausea, rash, MSK pain, visual changes Monitor for irAEs     [name], MD  INTERVAL HISTORY: Patient returns for follow-up. Did not feel well after infusion last week and went to ED. CT concerns for progression. She took dilaudid  and resulted in more fatigue and sleeping most of the day over the weekend. So she stopped dilaudid . No new persistent headache, diplopia. No other systemic symptoms.  Oncology History  Melanoma metastatic to liver [facility])  11/16/2022 Imaging   She presented to the emergency room on 11/16/2022 for back pain x 2 weeks and central chest pain for several months   CTA CAP IMPRESSION: 1. Negative for aortic dissection or aneurysm. 2. 1.2 cm left upper lobe nodule.  For 3. Multiple ill-defined liver lesions, suspicious for metastatic disease. 4. Multiple lytic osseous lesions, suspicious for metastatic disease. Pathologic fracture of the superior endplate of L5   11/25/2022 Pathology Results   1. Liver, needle/core biopsy, Left hepatic lobe lesion :       - METASTATIC MELANOMA.    12/04/2022 Initial Diagnosis   Melanoma of skin (HCC) Liver, bone metastases. 1.2 cm LUL nodule   12/07/2022 Cancer Staging   Staging form: Melanoma of the Skin, AJCC 8th Edition - Clinical: Stage IV (cTX, cN1, pM1d(0)) - Signed by [name], MD on 03/01/2023   12/09/2022 PET scan   PET: Diffuse bone and liver metastases.  Lung metastases.    12/11/2022 -  Chemotherapy   Patient is on Treatment Plan : MELANOMA Nivolumab  (1) + Ipilimumab  (3) q21d / Nivolumab  (480) q28d     12/17/2022 Imaging   MRI brain 1. Three small right hemisphere brain metastases metastases ranging from 2 mm to 4 mm. Minimal hemosiderin. No associated cerebral edema or mass effect. No other metastatic disease  identified in the brain.   2. Known osseous metastatic disease but no destructive lesion identified in the skull or visible cervical spine.   3.  Moderate for age cerebral white matter changes most commonly due to small vessel disease.   12/17/2022 Imaging   MRI brain 1. Three small right hemisphere brain metastases metastases ranging from 2 mm to 4 mm. Minimal hemosiderin. No associated cerebral edema or mass effect. No other metastatic disease identified in the brain.   2. Known osseous metastatic disease but no destructive lesion identified in the skull or visible cervical spine.   3. Moderate for age cerebral white matter changes most commonly due to small vessel disease.   02/15/2023 PET scan   PET whole body  1. The degree metastatic disease defined on recent CT scan is less impressive on FDG PET scan. 2. There is intense hypermetabolic lesion in the lateral segment of the LEFT hepatic lobe which corresponds to a large 3.7 cm mass. Additional small hepatic lesions have mild metabolic activity. 3. Very low metabolic activity associated with a LEFT upper lobe pulmonary nodule which is decreased in size. Favor positive response to therapy. 4. Small LEFT hilar hypermetabolic node is indeterminate. 5. Widespread lytic lesions throughout the axillary appendicular skeleton. Majority these lesions do not have hypermetabolic activity. One soft tissue lesion in the LEFT sacral ala does have mild metabolic activity.      PHYSICAL EXAMINATION: ECOG PERFORMANCE STATUS: 2 - Symptomatic, <50% confined to bed  Vitals:   05/31/23 1032  BP: 105/75  Pulse: (!) 120  Resp: 18  Temp: (!) 97.4 F (36.3 C)  SpO2: 100%   Filed Weights   05/31/23 1032  Weight: 97 lb 1.6 oz (44 kg)    GENERAL: alert, no distress and comfortable SKIN: skin color normal and no jaundice  EYES: normal, sclera clear OROPHARYNX: no exudate  NECK: No palpable mass LUNGS: clear to auscultation and percussion with normal breathing effort HEART: regular rate & rhythm  ABDOMEN: abdomen soft, non-tender and nondistended. Musculoskeletal: no edema NEURO:  no focal motor/sensory deficits  Relevant data reviewed during this visit included labs and imaging. I personally reviewed her imaging.

## 2023-05-31 NOTE — Assessment & Plan Note (Addendum)
 Continue olanzapine  nightly Continue scopolamine  patch. Compazine  and zofran  and compazine  as needed Continue pepcid  twice daily.

## 2023-05-31 NOTE — Progress Notes (Signed)
 Palliative Medicine [facility]  [phone] [phone]   Name: Laura [name] Date: 05/31/2023 MRN: [id number]  DOB: 05-15-1965  Patient Care Team: Darnelle Elders, [name] as PCP - General (Family Medicine) Pickenpack-Cousar, Giles Labrum, NP as Nurse Practitioner [facility] and Palliative Medicine)    INTERVAL HISTORY: Laura [name] is a 58 y.o. female with oncologic medical history including malignant melanoma (10/2022) with metastatic disease to the bone. As well as a history of hypertension, hyperlipidemia, GERD, and arthritis. Palliative ask to see for symptom management and goals of care.   SOCIAL HISTORY:     reports that she quit smoking about 7 years ago. Her smoking use included cigarettes. She started smoking about 27 years ago. She has a 20 pack-year smoking history. She has never used smokeless tobacco. She reports that she does not drink alcohol and does not use drugs.  ADVANCE DIRECTIVES:  None on file   CODE STATUS: Full code  PAST MEDICAL HISTORY: Past Medical History:  Diagnosis Date   Anxiety    Arthritis    Complication of anesthesia    Fibromyalgia    Gallstones 04/20/2018   GERD (gastroesophageal reflux disease)    History of blood transfusion    post Hysterectomy   Hypercholesteremia    Melanoma (HCC)    on back and excised   Metastatic melanoma (HCC)    Migraine    PONV (postoperative nausea and vomiting)    Tobacco abuse    Vertebral artery disease (HCC)    history of left vertebral artery dissection 2011    ALLERGIES:  is allergic to aspirin and tylenol  [acetaminophen ].  MEDICATIONS:  Current Outpatient Medications  Medication Sig Dispense Refill   binimetinib  (MEKTOVI ) 15 MG tablet Take 3 tablets (45 mg total) by mouth 2 (two) times daily. 180 tablet 11   calcium  carbonate (OS-CAL - DOSED IN MG OF ELEMENTAL CALCIUM ) 1250 (500 Ca) MG tablet Take 1 tablet by mouth daily at 2 PM. Pt takes w/ Vitamin D      docusate sodium  (COLACE) 100 MG capsule Take 100-200 mg by mouth daily.     dronabinol  (MARINOL ) 10 MG capsule Take 1 capsule (10 mg total) by mouth 3 (three) times daily. 90 capsule 0   encorafenib  (BRAFTOVI ) 75 MG capsule Take 6 capsules (450 mg total) by mouth daily. 180 capsule 11   famotidine  (PEPCID ) 20 MG tablet Take 20 mg by mouth 2 (two) times daily.     fentaNYL  (DURAGESIC ) 25 MCG/HR Place 1 patch onto the skin every 3 (three) days. 10 patch 0   gabapentin  (NEURONTIN ) 100 MG capsule Take 1 capsule (100 mg total) by mouth 2 (two) times daily. 60 capsule 0   glycopyrrolate  (ROBINUL ) 1 MG tablet Take 1 tablet (1 mg total) by mouth 2 (two) times daily as needed. 30 tablet 2   hydrOXYzine (ATARAX) 25 MG tablet Take 25 mg by mouth daily.     ibuprofen  (ADVIL ) 600 MG tablet Take 1 tablet (600 mg total) by mouth every 6 (six) hours as needed for fever, headache or mild pain 30 tablet 0   lidocaine -prilocaine  (EMLA ) cream Apply to port site 30-45 min prior to appointment. 30 g 6   OLANZapine  (ZYPREXA ) 2.5 MG tablet Take 1 tablet (2.5 mg total) by mouth at bedtime. 30 tablet 1   ondansetron  (ZOFRAN -ODT) 8 MG disintegrating tablet Dissolve 1 tablet (8 mg total) by mouth every 8 (eight) hours as needed for nausea or vomiting. 60  tablet 2   potassium chloride  (KLOR-CON ) 20 MEQ packet Mix 1 packet (20 mEq) and take by mouth 2 (two) times daily as directed. 60 packet 3   prochlorperazine  (COMPAZINE ) 10 MG tablet Take 1 tablet (10 mg total) by mouth every 6 (six) hours as needed for nausea or vomiting. 30 tablet 2   rosuvastatin  (CRESTOR ) 10 MG tablet Take 10 mg by mouth at bedtime.     scopolamine  (TRANSDERM-SCOP) 1 MG/3DAYS Place 1 patch (1.5 mg total) onto the skin every 3 (three) days. 10 patch 12   traMADol  (ULTRAM ) 50 MG tablet Take 1-2 tablets (50-100 mg total) by mouth every 6 (six) hours as needed for moderate pain (pain score 4-6) or severe pain (pain score 7-10). 90 tablet 0   triamcinolone   ointment (KENALOG ) 0.5 % Apply topically to affected area twice daily 30 g 0   zolpidem  (AMBIEN  CR) 12.5 MG CR tablet Take 1 tablet (12.5 mg total) by mouth at bedtime as needed for sleep. 30 tablet 2   No current facility-administered medications for this visit.    VITAL SIGNS: There were no vitals taken for this visit. There were no vitals filed for this visit.  Estimated body mass index is 17.76 kg/m as calculated from the following:   Height as of an earlier encounter on 05/31/23: 5\' 2"  (1.575 m).   Weight as of an earlier encounter on 05/31/23: 97 lb 1.6 oz (44 kg).   PERFORMANCE STATUS (ECOG) : 1 - Symptomatic but completely ambulatory  Physical Exam General: NAD Cardiovascular: regular rate and rhythm Pulmonary: normal breathing pattern Extremities: no edema, no joint deformities Skin: no rashes Neurological: AAO x3  IMPRESSION: Discussed the use of AI scribe software for clinical note transcription with the patient, who gave verbal consent to proceed.  History of Present Illness Laura [name] is a 58 year old female who presents to clinic for symptom management follow-up.  She is complaining of increased fatigue and pain management issues.  Accompanied by her husband.  She experiences increased fatigue, which she describes as more pronounced than usual. She attributes this fatigue to her ongoing cancer treatment and pain recent episodes of uncontrolled pain. Her energy levels have not improved, and she feels more tired than during previous visits.  Patient is hopeful for improvement as she and husband are planning a well-deserved cruise vacation in the next couple of weeks.  Her appetite has been variable, with some improvement noted recently. Overexertion, such as engaging in activities like cleaning, leads to nausea and vomiting, which she associates with either overheating or fatigue. Over the weekend, she primarily consumed pot roast which she enjoyed.  Nausea is better  controlled with antiemetics including scopolamine  patch.  Patient's current weight is stable at 97 pounds.  We discussed her pain at length.  Mrs. Filipe pain management includes tramadol ,  two tablets every four hours as needed, and a 25 mcg fentanyl  patch.  Recent ED visit due to increased pain and dehydration.  Concerns for compression fractures causing increased back pain.  Over the weekend recommended patient increase gabapentin  100 mg to 3 times daily in addition to tramadol  1-2 tablets every 4-6 hours as needed for moderate to severe pain.  Patient reports pain is much better controlled during visit today.  Education provided on potential increase in fentanyl  patch if pain was to worsen despite current regimen.  She and husband verbalized understanding and appreciation.  I discussed the importance of continued conversation with family and their medical  providers regarding overall plan of care and treatment options, ensuring decisions are within the context of the patients values and GOCs. Assessment & Plan Cancer related pain Pain managed with fentanyl  patch and tramadol .  Recent ED visit due to increased pain. - Fentanyl  patch 25 mcg.  Discussed possibility of increasing to 37 mcg if pain worsens on current regimen. -Gabapentin  100 mg 3 times daily - Continue tramadol  50-100 mg every 4-6 hours as needed.   - Contact provider via MyChart for any issues before follow-up.  Nausea and vomiting Intermittent symptoms improved with current management. -Continue famotidine  20 mg twice daily -Robinul  1 mg twice daily as needed -Olanzapine  2.5 mg at bedtime -Ondansetron  8 mg every 8 hours as needed -Promethazine  25 mg every 8 hours as needed -Scopolamine  patch 1.5 change every 72 hours  Nutritional intake Improvement noted but intake still suboptimal. - Encourage improved nutritional intake and monitor  I will plan to see patient back in the clinic 3-4 weeks.  Sooner if needed.  Patient  expressed understanding and was in agreement with this plan. She also understands that She can call the clinic at any time with any questions, concerns, or complaints.   Any controlled substances utilized were prescribed in the context of palliative care. PDMP has been reviewed.   Visit consisted of counseling and education dealing with the complex and emotionally intense issues of symptom management and palliative care in the setting of serious and potentially life-threatening illness.  Dellia Ferguson, AGPCNP-BC  Palliative Medicine Team/[facility]

## 2023-05-31 NOTE — Assessment & Plan Note (Addendum)
 Stable. Continue to increase fluid intake.

## 2023-05-31 NOTE — Assessment & Plan Note (Addendum)
 Worsening last week Will repeat folate, check b12

## 2023-05-31 NOTE — Assessment & Plan Note (Addendum)
 Continues Marinol to 10 mg twice daily.

## 2023-05-31 NOTE — Assessment & Plan Note (Addendum)
 LFT stable.  CT from ED showed progression. Discuss results and considering change to relatlimab-nivo if PET confirms progression. Continue encorafenib   450 mg once daily with binimetinib  45 mg twice daily. PET in early June CBC, CMP, LDH every visit with follow up

## 2023-06-01 ENCOUNTER — Other Ambulatory Visit (HOSPITAL_COMMUNITY): Payer: Self-pay

## 2023-06-02 ENCOUNTER — Emergency Department (HOSPITAL_COMMUNITY)

## 2023-06-02 ENCOUNTER — Encounter (HOSPITAL_COMMUNITY): Payer: Self-pay | Admitting: Emergency Medicine

## 2023-06-02 ENCOUNTER — Other Ambulatory Visit: Payer: Self-pay

## 2023-06-02 ENCOUNTER — Emergency Department (HOSPITAL_COMMUNITY)
Admission: EM | Admit: 2023-06-02 | Discharge: 2023-06-03 | Disposition: A | Discharge status: Home / Self Care | Attending: Emergency Medicine | Admitting: Emergency Medicine

## 2023-06-02 DIAGNOSIS — Z85820 Personal history of malignant melanoma of skin: Secondary | ICD-10-CM | POA: Insufficient documentation

## 2023-06-02 DIAGNOSIS — D649 Anemia, unspecified: Secondary | ICD-10-CM | POA: Insufficient documentation

## 2023-06-02 DIAGNOSIS — R410 Disorientation, unspecified: Secondary | ICD-10-CM | POA: Diagnosis not present

## 2023-06-02 DIAGNOSIS — R42 Dizziness and giddiness: Secondary | ICD-10-CM | POA: Diagnosis not present

## 2023-06-02 DIAGNOSIS — R4182 Altered mental status, unspecified: Secondary | ICD-10-CM | POA: Diagnosis not present

## 2023-06-02 DIAGNOSIS — T50905A Adverse effect of unspecified drugs, medicaments and biological substances, initial encounter: Secondary | ICD-10-CM | POA: Diagnosis not present

## 2023-06-02 DIAGNOSIS — R638 Other symptoms and signs concerning food and fluid intake: Secondary | ICD-10-CM | POA: Insufficient documentation

## 2023-06-02 DIAGNOSIS — R531 Weakness: Secondary | ICD-10-CM | POA: Diagnosis not present

## 2023-06-02 DIAGNOSIS — I959 Hypotension, unspecified: Secondary | ICD-10-CM | POA: Diagnosis not present

## 2023-06-02 DIAGNOSIS — T50995A Adverse effect of other drugs, medicaments and biological substances, initial encounter: Secondary | ICD-10-CM | POA: Diagnosis not present

## 2023-06-02 DIAGNOSIS — R000 Tachycardia, unspecified: Secondary | ICD-10-CM | POA: Diagnosis not present

## 2023-06-02 LAB — CBC WITH DIFFERENTIAL/PLATELET
Abs Immature Granulocytes: 0.03 10*3/uL (ref 0.00–0.07)
Basophils Absolute: 0 10*3/uL (ref 0.0–0.1)
Basophils Relative: 1 %
Eosinophils Absolute: 0.2 10*3/uL (ref 0.0–0.5)
Eosinophils Relative: 4 %
HCT: 27.8 % — ABNORMAL LOW (ref 36.0–46.0)
Hemoglobin: 8.8 g/dL — ABNORMAL LOW (ref 12.0–15.0)
Immature Granulocytes: 1 %
Lymphocytes Relative: 22 %
Lymphs Abs: 1.1 10*3/uL (ref 0.7–4.0)
MCH: 30.3 pg (ref 26.0–34.0)
MCHC: 31.7 g/dL (ref 30.0–36.0)
MCV: 95.9 fL (ref 80.0–100.0)
Monocytes Absolute: 0.5 10*3/uL (ref 0.1–1.0)
Monocytes Relative: 9 %
Neutro Abs: 3.3 10*3/uL (ref 1.7–7.7)
Neutrophils Relative %: 63 %
Platelets: 343 10*3/uL (ref 150–400)
RBC: 2.9 MIL/uL — ABNORMAL LOW (ref 3.87–5.11)
RDW: 14 % (ref 11.5–15.5)
WBC: 5.2 10*3/uL (ref 4.0–10.5)
nRBC: 0 % (ref 0.0–0.2)

## 2023-06-02 LAB — COMPREHENSIVE METABOLIC PANEL WITH GFR
ALT: 10 U/L (ref 0–44)
AST: 27 U/L (ref 15–41)
Albumin: 2.4 g/dL — ABNORMAL LOW (ref 3.5–5.0)
Alkaline Phosphatase: 83 U/L (ref 38–126)
Anion gap: 5 (ref 5–15)
BUN: 12 mg/dL (ref 6–20)
CO2: 24 mmol/L (ref 22–32)
Calcium: 7.5 mg/dL — ABNORMAL LOW (ref 8.9–10.3)
Chloride: 103 mmol/L (ref 98–111)
Creatinine, Ser: 1.1 mg/dL — ABNORMAL HIGH (ref 0.44–1.00)
GFR, Estimated: 59 mL/min — ABNORMAL LOW (ref >60)
Glucose, Bld: 79 mg/dL (ref 70–99)
Potassium: 3.9 mmol/L (ref 3.5–5.1)
Sodium: 132 mmol/L — ABNORMAL LOW (ref 135–145)
Total Bilirubin: 0.5 mg/dL (ref 0.0–1.2)
Total Protein: 5.3 g/dL — ABNORMAL LOW (ref 6.5–8.1)

## 2023-06-02 LAB — I-STAT CG4 LACTIC ACID, ED: Lactic Acid, Venous: 0.7 mmol/L (ref 0.5–1.9)

## 2023-06-02 LAB — PROTIME-INR
INR: 1 (ref 0.8–1.2)
Prothrombin Time: 13.7 s (ref 11.4–15.2)

## 2023-06-02 NOTE — ED Triage Notes (Signed)
 Patient presents from home due to AMS weakness and hypotension. She has had increased confusions and unable to identify members of her family. EMS administered 500 ml of fluid.   HX cancer  EMS vitals: 88/57 BP post 500 cc 127 CBG 100's P

## 2023-06-03 ENCOUNTER — Inpatient Hospital Stay: Admitting: Dietician

## 2023-06-03 ENCOUNTER — Inpatient Hospital Stay

## 2023-06-03 ENCOUNTER — Other Ambulatory Visit (HOSPITAL_COMMUNITY): Payer: Self-pay

## 2023-06-03 ENCOUNTER — Emergency Department (HOSPITAL_COMMUNITY)

## 2023-06-03 VITALS — BP 103/70 | HR 108 | Temp 98.7°F | Resp 14

## 2023-06-03 DIAGNOSIS — R4182 Altered mental status, unspecified: Secondary | ICD-10-CM | POA: Diagnosis not present

## 2023-06-03 DIAGNOSIS — R531 Weakness: Secondary | ICD-10-CM | POA: Diagnosis not present

## 2023-06-03 DIAGNOSIS — E860 Dehydration: Secondary | ICD-10-CM

## 2023-06-03 LAB — RAPID URINE DRUG SCREEN, HOSP PERFORMED
Amphetamines: NOT DETECTED
Barbiturates: NOT DETECTED
Benzodiazepines: NOT DETECTED
Cocaine: NOT DETECTED
Opiates: NOT DETECTED
Tetrahydrocannabinol: POSITIVE — AB

## 2023-06-03 LAB — URINALYSIS, W/ REFLEX TO CULTURE (INFECTION SUSPECTED)
Bacteria, UA: NONE SEEN
Bilirubin Urine: NEGATIVE
Glucose, UA: NEGATIVE mg/dL
Hgb urine dipstick: NEGATIVE
Ketones, ur: NEGATIVE mg/dL
Leukocytes,Ua: NEGATIVE
Nitrite: NEGATIVE
Protein, ur: NEGATIVE mg/dL
Specific Gravity, Urine: 1.008 (ref 1.005–1.030)
pH: 6 (ref 5.0–8.0)

## 2023-06-03 MED ORDER — SODIUM CHLORIDE 0.9 % IV BOLUS
500.0000 mL | Freq: Once | INTRAVENOUS | Status: DC
Start: 2023-06-03 — End: 2023-06-03

## 2023-06-03 MED ORDER — SODIUM CHLORIDE 0.9 % IV SOLN: INTRAVENOUS | Status: AC

## 2023-06-03 MED ORDER — SODIUM CHLORIDE 0.9 % IV BOLUS
1000.0000 mL | Freq: Once | INTRAVENOUS | Status: AC
  Administered 2023-06-03: 1000 mL via INTRAVENOUS

## 2023-06-03 MED ORDER — SODIUM CHLORIDE 0.9% FLUSH
10.0000 mL | Freq: Once | INTRAVENOUS | Status: AC | PRN
  Administered 2023-06-03: 10 mL

## 2023-06-03 MED ORDER — ONDANSETRON HCL 4 MG/2ML IJ SOLN
INTRAMUSCULAR | Status: AC
  Administered 2023-06-03: 4 mg
  Filled 2023-06-03: qty 2

## 2023-06-03 MED ORDER — HEPARIN SOD (PORK) LOCK FLUSH 100 UNIT/ML IV SOLN
500.0000 [IU] | Freq: Once | INTRAVENOUS | Status: AC | PRN
  Administered 2023-06-03: 500 [IU]

## 2023-06-03 MED ORDER — FAMOTIDINE IN NACL 20-0.9 MG/50ML-% IV SOLN
20.0000 mg | Freq: Once | INTRAVENOUS | Status: AC
  Administered 2023-06-03: 20 mg via INTRAVENOUS
  Filled 2023-06-03: qty 50

## 2023-06-03 NOTE — Progress Notes (Signed)
 Nutrition Follow-up:  Pt with malignant melanoma metastatic to brain, liver, bone. She is receiving nivolumab  q28d.   Met with patient in infusion. Husband is present. Patient is feeling tired today after ED visit last night secondary to AMS. Her appetite is about the same, says she is making herself eat and continues to lose weight. She is taking marinol  as prescribed for appetite. Patient has not eaten today. Agreeable to sliced cheese, crackers and applesauce during infusion. She is not drinking CIB daily. Pt and husband going on a cruise at end of month. She plans to do nothing but eat on her trip.    Medications: reviewed   Labs: Na 132, Cr 1.10, albumin 2.4  Anthropometrics: Wt 97 lb 1.6 oz on 5/5 - decreased (pt currently underwt for BMI 17.76)  4/9 - 9 lb 12.8 oz  3/20 - 105 lb 8 oz   NUTRITION DIAGNOSIS: Inadequate oral intake continues     INTERVENTION:  Discussed scheduled po intake - within first hour of waking followed by bites/sips q2-3h Encourage bedtime snack Continue marinol  for appetite    MONITORING, EVALUATION, GOAL: wt trends, intake   NEXT VISIT: Thursday June 12 during infusion

## 2023-06-03 NOTE — ED Provider Notes (Signed)
 [place] EMERGENCY DEPARTMENT AT [facility] Provider Note   CSN: [id number] Arrival date & time: 06/02/23  2243     History  Chief Complaint  Patient presents with   Altered Mental Status    Laura [name] is a 59 y.o. female.  The history is provided by the patient.  Altered Mental Status Presenting symptoms: confusion   Severity:  Moderate Most recent episode:  Today Episode history:  Single Timing:  Constant Progression:  Unchanged Chronicity:  New Context: not recent infection   Associated symptoms: no fever and no vomiting   Patient under care of oncology for metastatic melanoma presents with AMS.  Patient is on multiple home pain medications for her symptom relief and has not been eating and drinking well.  No fevers,  no diarrhea or emesis.  No cough.  No urinary symptoms     Past Medical History:  Diagnosis Date   Anxiety    Arthritis    Complication of anesthesia    Fibromyalgia    Gallstones 04/20/2018   GERD (gastroesophageal reflux disease)    History of blood transfusion    post Hysterectomy   Hypercholesteremia    Melanoma (HCC)    on back and excised   Metastatic melanoma (HCC)    Migraine    PONV (postoperative nausea and vomiting)    Tobacco abuse    Vertebral artery disease (HCC)    history of left vertebral artery dissection 2011     Home Medications Prior to Admission medications   Medication Sig Start Date End Date Taking? Authorizing Provider  binimetinib  (MEKTOVI ) 15 MG tablet Take 3 tablets (45 mg total) by mouth 2 (two) times daily. 03/16/23   [name], MD  calcium  carbonate (OS-CAL - DOSED IN MG OF ELEMENTAL CALCIUM ) 1250 (500 Ca) MG tablet Take 1 tablet by mouth daily at 2 PM. Pt takes w/ Vitamin D    [provider]  docusate sodium  (COLACE) 100 MG capsule Take 100-200 mg by mouth daily.    [provider]  dronabinol  (MARINOL ) 10 MG capsule Take 1 capsule (10 mg total) by mouth 3 (three)  times daily. 05/31/23   Pickenpack-Cousar, Giles Labrum, NP  encorafenib  (BRAFTOVI ) 75 MG capsule Take 6 capsules (450 mg total) by mouth daily. 03/16/23   [name], MD  famotidine  (PEPCID ) 20 MG tablet Take 20 mg by mouth 2 (two) times daily.    [provider]  fentaNYL  (DURAGESIC ) 25 MCG/HR Place 1 patch onto the skin every 3 (three) days. 05/31/23   Pickenpack-Cousar, Athena N, NP  gabapentin  (NEURONTIN ) 100 MG capsule Take 1 capsule (100 mg total) by mouth 2 (two) times daily. 05/31/23   [name], MD  glycopyrrolate  (ROBINUL ) 1 MG tablet Take 1 tablet (1 mg total) by mouth 2 (two) times daily as needed. 03/16/23   Pickenpack-Cousar, Athena N, NP  hydrOXYzine (ATARAX) 25 MG tablet Take 25 mg by mouth daily. 04/14/23   [provider]  ibuprofen  (ADVIL ) 600 MG tablet Take 1 tablet (600 mg total) by mouth every 6 (six) hours as needed for fever, headache or mild pain 05/25/23     lidocaine -prilocaine  (EMLA ) cream Apply to port site 30-45 min prior to appointment. 03/16/23   [name], MD  OLANZapine  (ZYPREXA ) 2.5 MG tablet Take 1 tablet (2.5 mg total) by mouth at bedtime. 05/31/23   [name], MD  ondansetron  (ZOFRAN -ODT) 8 MG disintegrating tablet Dissolve 1 tablet (8 mg total)  by mouth every 8 (eight) hours as needed for nausea or vomiting. 04/01/23   [name], MD  potassium chloride  (KLOR-CON ) 20 MEQ packet Mix 1 packet (20 mEq) and take by mouth 2 (two) times daily as directed. 05/20/23   [name], MD  prochlorperazine  (COMPAZINE ) 10 MG tablet Take 1 tablet (10 mg total) by mouth every 6 (six) hours as needed for nausea or vomiting. 05/31/23   Pickenpack-Cousar, Giles Labrum, NP  rosuvastatin  (CRESTOR ) 10 MG tablet Take 10 mg by mouth at bedtime.    [provider]  scopolamine  (TRANSDERM-SCOP) 1 MG/3DAYS Place 1 patch (1.5 mg total) onto the skin every 3 (three) days. 04/15/23   [name], MD  traMADol  (ULTRAM ) 50 MG tablet Take 1-2 tablets (50-100  mg total) by mouth every 6 (six) hours as needed for moderate pain (pain score 4-6) or severe pain (pain score 7-10). 05/31/23   Pickenpack-Cousar, Giles Labrum, NP  triamcinolone  ointment (KENALOG ) 0.5 % Apply topically to affected area twice daily 05/25/23   [name], MD  zolpidem  (AMBIEN  CR) 12.5 MG CR tablet Take 1 tablet (12.5 mg total) by mouth at bedtime as needed for sleep. 04/29/23 04/28/24  [name], MD      Allergies    Aspirin and Tylenol  [acetaminophen ]    Review of Systems   Review of Systems  Constitutional:  Negative for fever.  Respiratory:  Negative for cough.   Gastrointestinal:  Negative for vomiting.  Genitourinary:  Negative for dysuria.  Psychiatric/Behavioral:  Positive for confusion.   All other systems reviewed and are negative.   Physical Exam Updated Vital Signs BP 100/71   Pulse 88   Temp 97.9 F (36.6 C) (Oral)   Resp 18   SpO2 97%  Physical Exam Vitals and nursing note reviewed.  Constitutional:      General: She is not in acute distress.    Appearance: Normal appearance. She is well-developed.  HENT:     Head: Normocephalic and atraumatic.     Nose: Nose normal.     Mouth/Throat:     Mouth: Mucous membranes are moist.     Pharynx: Oropharynx is clear. No oropharyngeal exudate or posterior oropharyngeal erythema.  Eyes:     Pupils: Pupils are equal, round, and reactive to light.  Cardiovascular:     Rate and Rhythm: Normal rate and regular rhythm.     Pulses: Normal pulses.     Heart sounds: Normal heart sounds.  Pulmonary:     Effort: Pulmonary effort is normal. No respiratory distress.     Breath sounds: Normal breath sounds.  Abdominal:     General: Bowel sounds are normal. There is no distension.     Palpations: Abdomen is soft.     Tenderness: There is no abdominal tenderness. There is no guarding or rebound.  Musculoskeletal:        General: Normal range of motion.     Cervical back: Normal range of motion and neck supple.   Skin:    General: Skin is warm and dry.     Capillary Refill: Capillary refill takes less than 2 seconds.     Findings: No erythema or rash.  Neurological:     General: No focal deficit present.     Mental Status: She is alert and oriented to person, place, and time.     Deep Tendon Reflexes: Reflexes normal.  Psychiatric:        Mood and Affect: Mood normal.  ED Results / Procedures / Treatments   Labs (all labs ordered are listed, but only abnormal results are displayed) Results for orders placed or performed during the hospital encounter of 06/02/23  Comprehensive metabolic panel   Collection Time: 06/02/23 11:08 PM  Result Value Ref Range   Sodium 132 (L) 135 - 145 mmol/L   Potassium 3.9 3.5 - 5.1 mmol/L   Chloride 103 98 - 111 mmol/L   CO2 24 22 - 32 mmol/L   Glucose, Bld 79 70 - 99 mg/dL   BUN 12 6 - 20 mg/dL   Creatinine, Ser 4.09 (H) 0.44 - 1.00 mg/dL   Calcium  7.5 (L) 8.9 - 10.3 mg/dL   Total Protein 5.3 (L) 6.5 - 8.1 g/dL   Albumin 2.4 (L) 3.5 - 5.0 g/dL   AST 27 15 - 41 U/L   ALT 10 0 - 44 U/L   Alkaline Phosphatase 83 38 - 126 U/L   Total Bilirubin 0.5 0.0 - 1.2 mg/dL   GFR, Estimated 59 (L) >60 mL/min   Anion gap 5 5 - 15  CBC with Differential   Collection Time: 06/02/23 11:08 PM  Result Value Ref Range   WBC 5.2 4.0 - 10.5 K/uL   RBC 2.90 (L) 3.87 - 5.11 MIL/uL   Hemoglobin 8.8 (L) 12.0 - 15.0 g/dL   HCT 81.1 (L) 91.4 - 78.2 %   MCV 95.9 80.0 - 100.0 fL   MCH 30.3 26.0 - 34.0 pg   MCHC 31.7 30.0 - 36.0 g/dL   RDW 95.6 21.3 - 08.6 %   Platelets 343 150 - 400 K/uL   nRBC 0.0 0.0 - 0.2 %   Neutrophils Relative % 63 %   Neutro Abs 3.3 1.7 - 7.7 K/uL   Lymphocytes Relative 22 %   Lymphs Abs 1.1 0.7 - 4.0 K/uL   Monocytes Relative 9 %   Monocytes Absolute 0.5 0.1 - 1.0 K/uL   Eosinophils Relative 4 %   Eosinophils Absolute 0.2 0.0 - 0.5 K/uL   Basophils Relative 1 %   Basophils Absolute 0.0 0.0 - 0.1 K/uL   Immature Granulocytes 1 %   Abs  Immature Granulocytes 0.03 0.00 - 0.07 K/uL  Protime-INR   Collection Time: 06/02/23 11:08 PM  Result Value Ref Range   Prothrombin Time 13.7 11.4 - 15.2 seconds   INR 1.0 0.8 - 1.2  I-Stat Lactic Acid, ED   Collection Time: 06/02/23 11:12 PM  Result Value Ref Range   Lactic Acid, Venous 0.7 0.5 - 1.9 mmol/L  Urinalysis, w/ Reflex to Culture (Infection Suspected) -Urine, Clean Catch   Collection Time: 06/03/23  1:42 AM  Result Value Ref Range   Specimen Source URINE, CATHETERIZED    Color, Urine YELLOW YELLOW   APPearance CLEAR CLEAR   Specific Gravity, Urine 1.008 1.005 - 1.030   pH 6.0 5.0 - 8.0   Glucose, UA NEGATIVE NEGATIVE mg/dL   Hgb urine dipstick NEGATIVE NEGATIVE   Bilirubin Urine NEGATIVE NEGATIVE   Ketones, ur NEGATIVE NEGATIVE mg/dL   Protein, ur NEGATIVE NEGATIVE mg/dL   Nitrite NEGATIVE NEGATIVE   Leukocytes,Ua NEGATIVE NEGATIVE   RBC / HPF 0-5 0 - 5 RBC/hpf   WBC, UA 0-5 0 - 5 WBC/hpf   Bacteria, UA NONE SEEN NONE SEEN   Squamous Epithelial / HPF 0-5 0 - 5 /HPF  Rapid urine drug screen (hospital performed)   Collection Time: 06/03/23  1:42 AM  Result Value Ref Range   Opiates NONE DETECTED  NONE DETECTED   Cocaine NONE DETECTED NONE DETECTED   Benzodiazepines NONE DETECTED NONE DETECTED   Amphetamines NONE DETECTED NONE DETECTED   Tetrahydrocannabinol POSITIVE (A) NONE DETECTED   Barbiturates NONE DETECTED NONE DETECTED   CT Head Wo Contrast Result Date: 06/03/2023 CLINICAL DATA:  Altered mental status, weakness EXAM: CT HEAD WITHOUT CONTRAST TECHNIQUE: Contiguous axial images were obtained from the base of the skull through the vertex without intravenous contrast. RADIATION DOSE REDUCTION: This exam was performed according to the departmental dose-optimization program which includes automated exposure control, adjustment of the mA and/or kV according to patient size and/or use of iterative reconstruction technique. COMPARISON:  12/27/2022 FINDINGS: Brain: No  acute intracranial abnormality. Specifically, no hemorrhage, hydrocephalus, mass lesion, acute infarction, or significant intracranial injury. Vascular: No hyperdense vessel or unexpected calcification. Skull: No acute calvarial abnormality. Sinuses/Orbits: No acute findings Other: None IMPRESSION: No acute intracranial abnormality. Electronically Signed   By: Janeece Mechanic M.D.   On: 06/03/2023 00:35   DG Chest Portable 1 View Result Date: 06/02/2023 CLINICAL DATA:  Altered mental status EXAM: PORTABLE CHEST 1 VIEW COMPARISON:  05/27/2023 FINDINGS: Right chest wall Port-A-Cath tip in the mid SVC. Stable cardiomediastinal silhouette. Apical lordotic view of the lungs. No focal consolidation, pleural effusion, or pneumothorax. No displaced rib fracture. Compression fracture of a midthoracic vertebral body is unchanged. IMPRESSION: No active disease. Electronically Signed   By: Rozell Cornet M.D.   On: 06/02/2023 23:40   CT L-SPINE NO CHARGE Result Date: 05/27/2023 CLINICAL DATA:  Right upper quadrant abdominal pain. Cancer patient. Chemotherapy 2 weeks ago. Nausea and vomiting. EXAM: CT Lumbar Spine with contrast TECHNIQUE: Technique: Multiplanar CT images of the lumbar spine were reconstructed from contemporary CT of the Abdomen and Pelvis. RADIATION DOSE REDUCTION: This exam was performed according to the departmental dose-optimization program which includes automated exposure control, adjustment of the mA and/or kV according to patient size and/or use of iterative reconstruction technique. CONTRAST:  No additional IV contrast material was administered for this reconstructed examination. COMPARISON:  CT abdomen and pelvis 04/06/2023 FINDINGS: Segmentation: 5 lumbar type vertebral bodies. Alignment: Lumbar scoliosis convex towards the right. No anterior subluxations. Vertebrae: Lucent and sclerotic changes throughout the lumbar vertebrae consistent with diffuse metastatic disease and similar to prior study.  Multiple vertebral compression fractures. L5 vertebra demonstrates fragmentation with about 50-70% loss of height consistent with pathologic fracture. No change. Focal compression of the superior endplate of L4 without change consistent with pathologic fracture. Minimal depression fracture of the inferior endplate of L2 consistent with pathologic fracture, unchanged. Diffuse compression and fragmentation of L1 with about 50-70% loss of height. No change. Compression fractures also identified at the T8 and T9 vertebra, not included in the prior study. Mild retropulsion of fracture fragments at L1 and L5. Metastatic lesions demonstrated in the posterior elements and in the visualized sacrum and pelvis. Paraspinal and other soft tissues: No abnormal paraspinal soft tissue mass or infiltration. Disc levels: Degenerative changes throughout with mild disc space narrowing and endplate osteophyte formation. Degenerative changes in the facet joints. IMPRESSION: 1. Diffuse metastatic disease throughout the lumbar spine with multiple pathologic compression fractures as above. 2. Underlying degenerative changes. 3. No significant change since prior study. Electronically Signed   By: Boyce Byes M.D.   On: 05/27/2023 15:57   CT ABDOMEN PELVIS W CONTRAST Result Date: 05/27/2023 CLINICAL DATA:  Acute nonlocalized abdominal pain. Right upper quadrant abdominal pain. Chemotherapy 2 weeks ago for cancer. Nausea and vomiting for 3  days. EXAM: CT ABDOMEN AND PELVIS WITH CONTRAST TECHNIQUE: Multidetector CT imaging of the abdomen and pelvis was performed using the standard protocol following bolus administration of intravenous contrast. RADIATION DOSE REDUCTION: This exam was performed according to the departmental dose-optimization program which includes automated exposure control, adjustment of the mA and/or kV according to patient size and/or use of iterative reconstruction technique. CONTRAST:  80mL OMNIPAQUE  IOHEXOL  300  MG/ML  SOLN COMPARISON:  PET-CT 04/29/2023.  CT abdomen and pelvis 04/06/2023 FINDINGS: Lower chest: Atelectasis or infiltration in the lung bases. Nodules in the left base, largest measuring 7 mm diameter. Series 5, image 26. Nodules are similar to prior study. Hepatobiliary: Numerous hypoenhancing liver metastases. Largest is in segment 2 of the left lobe of the liver measuring 3.7 cm diameter. Liver lesions appear to have progressed in size and conspicuity since the previous study. Surgical absence of the gallbladder. No bile duct dilatation. Pancreas: Unremarkable. No pancreatic ductal dilatation or surrounding inflammatory changes. Spleen: Focal low-attenuation lesion in the anterior spleen measuring 1.3 cm diameter. This is enlarged since prior study, worrisome for metastatic disease. Adrenals/Urinary Tract: Adrenal glands are unremarkable. Kidneys are normal, without renal calculi, focal lesion, or hydronephrosis. Bladder is unremarkable. Stomach/Bowel: Stomach is decompressed. There is evidence of edematous wall thickening in the pylorus and duodenal bulb region possibly representing peptic ulcer disease. The small bowel and colon are not abnormally distended. No wall thickening or inflammatory changes. Stool fills the colon. Appendix is not identified. Vascular/Lymphatic: Aortic atherosclerosis. No enlarged abdominal or pelvic lymph nodes. Reproductive: Status post hysterectomy. No adnexal masses. Other: Small to moderate free fluid throughout the abdomen and pelvis, progressing since prior study. This may indicate ascites or peritoneal neoplastic spread. No free air. Abdominal wall musculature appears intact. Musculoskeletal: Degenerative changes in the spine. Multiple lucent bone lesions with multiple compression fractures consistent with diffuse metastatic disease and pathologic compression fractures. Compression fractures are demonstrated at L5, L2, L1, and T8 and T9. Compression deformities appear  similar to prior study. IMPRESSION: 1. Multiple hepatic metastases demonstrating progression since prior study. 2. Focal lesion in the spleen is enlarged since prior study, worrisome for metastatic disease. 3. Progressing abdominal ascites may indicate liver dysfunction or peritoneal metastatic disease. 4. Diffuse bone metastasis with multiple vertebral compression fractures, unchanged. 5. Aortic atherosclerosis. 6. Wall thickening and edema of the pyloric region of the stomach and duodenal bulb suggesting peptic ulcer disease or gastroduodenitis. 7. Atelectasis or infiltration in the lung bases. 8. Lung base nodules measuring up to 7 mm without change. These may be metastatic. Electronically Signed   By: Boyce Byes M.D.   On: 05/27/2023 15:50   DG Chest 2 View Result Date: 05/27/2023 CLINICAL DATA:  Hypertension. EXAM: CHEST - 2 VIEW COMPARISON:  April 06, 2023. FINDINGS: The heart size and mediastinal contours are within normal limits. Both lungs are clear. Right internal jugular Port-A-Cath is unchanged. Old midthoracic compression fracture is noted. IMPRESSION: No active cardiopulmonary disease. Electronically Signed   By: Rosalene Colon M.D.   On: 05/27/2023 14:05    Radiology CT Head Wo Contrast Result Date: 06/03/2023 CLINICAL DATA:  Altered mental status, weakness EXAM: CT HEAD WITHOUT CONTRAST TECHNIQUE: Contiguous axial images were obtained from the base of the skull through the vertex without intravenous contrast. RADIATION DOSE REDUCTION: This exam was performed according to the departmental dose-optimization program which includes automated exposure control, adjustment of the mA and/or kV according to patient size and/or use of iterative reconstruction technique. COMPARISON:  12/27/2022 FINDINGS: Brain: No acute intracranial abnormality. Specifically, no hemorrhage, hydrocephalus, mass lesion, acute infarction, or significant intracranial injury. Vascular: No hyperdense vessel or unexpected  calcification. Skull: No acute calvarial abnormality. Sinuses/Orbits: No acute findings Other: None IMPRESSION: No acute intracranial abnormality. Electronically Signed   By: Janeece Mechanic M.D.   On: 06/03/2023 00:35   DG Chest Portable 1 View Result Date: 06/02/2023 CLINICAL DATA:  Altered mental status EXAM: PORTABLE CHEST 1 VIEW COMPARISON:  05/27/2023 FINDINGS: Right chest wall Port-A-Cath tip in the mid SVC. Stable cardiomediastinal silhouette. Apical lordotic view of the lungs. No focal consolidation, pleural effusion, or pneumothorax. No displaced rib fracture. Compression fracture of a midthoracic vertebral body is unchanged. IMPRESSION: No active disease. Electronically Signed   By: Rozell Cornet M.D.   On: 06/02/2023 23:40    Procedures Procedures    Medications Ordered in ED Medications  ondansetron  (ZOFRAN ) 4 MG/2ML injection (4 mg  Given 06/03/23 0008)  sodium chloride  0.9 % bolus 1,000 mL (0 mLs Intravenous Stopped 06/03/23 0228)    ED Course/ Medical Decision Making/ A&P                                 Medical Decision Making Patient with decreased PO intake and confusion. Patient is receiving therapy for melanoma   Amount and/or Complexity of Data Reviewed Independent Historian: spouse    Details: See above  External Data Reviewed: notes.    Details: Previous notes reviewed  Labs: ordered.    Details: White count normal 5.2, anemia 8.8 but at baseline, normal platelets. Sodium 132, normal potassium normal creatinine.  Urine without UTI Radiology: ordered and independent interpretation performed.    Details: No PNA on CXR  Risk Risk Details: Patient ate a meal from [employer] in the ED. Is awake and alert.  Given IV fluids and ambulated without orthostasis.  I suspect this is a combination of medication effects and Poor PO intake.  I have advised increased liquids and bland diet and discussion with regular doctor regarding pain medication.  Stable for discharge.  Strict  returns,      Final Clinical Impression(s) / ED Diagnoses Final diagnoses:  Adverse effect of drug, initial encounter  Decreased oral intake   No signs of systemic illness or infection. The patient is nontoxic-appearing on exam and vital signs are within normal limits.  I have reviewed the triage vital signs and the nursing notes. Pertinent labs & imaging results that were available during my care of the patient were reviewed by me and considered in my medical decision making (see chart for details). After history, exam, and medical workup I feel the patient has been appropriately medically screened and is safe for discharge home. Pertinent diagnoses were discussed with the patient. Patient was given return precautions.  Rx / DC Orders ED Discharge Orders     None         Silas Muff, MD 06/03/23 [phone]

## 2023-06-03 NOTE — ED Notes (Signed)
 Pt eating [employer] burger and french fries. No choking noted. No vomiting afterward

## 2023-06-03 NOTE — Patient Instructions (Signed)

## 2023-06-07 ENCOUNTER — Other Ambulatory Visit (HOSPITAL_COMMUNITY): Payer: Self-pay

## 2023-06-08 ENCOUNTER — Other Ambulatory Visit: Payer: Self-pay

## 2023-06-08 ENCOUNTER — Other Ambulatory Visit (HOSPITAL_COMMUNITY): Payer: Self-pay

## 2023-06-09 ENCOUNTER — Other Ambulatory Visit (HOSPITAL_COMMUNITY): Payer: Self-pay

## 2023-06-09 ENCOUNTER — Other Ambulatory Visit: Payer: Self-pay

## 2023-06-09 ENCOUNTER — Other Ambulatory Visit: Payer: Self-pay | Admitting: Nurse Practitioner

## 2023-06-09 DIAGNOSIS — R21 Rash and other nonspecific skin eruption: Secondary | ICD-10-CM

## 2023-06-09 DIAGNOSIS — G893 Neoplasm related pain (acute) (chronic): Secondary | ICD-10-CM

## 2023-06-09 DIAGNOSIS — C7931 Secondary malignant neoplasm of brain: Secondary | ICD-10-CM

## 2023-06-09 DIAGNOSIS — C7951 Secondary malignant neoplasm of bone: Secondary | ICD-10-CM

## 2023-06-09 DIAGNOSIS — Z515 Encounter for palliative care: Secondary | ICD-10-CM

## 2023-06-09 MED ORDER — IBUPROFEN 600 MG PO TABS
600.0000 mg | ORAL_TABLET | Freq: Four times a day (QID) | ORAL | 0 refills | Status: DC | PRN
  Filled 2023-06-09: qty 12, 3d supply, fill #0
  Filled 2023-06-09: qty 18, 5d supply, fill #0

## 2023-06-09 MED ORDER — TRAMADOL HCL 50 MG PO TABS
50.0000 mg | ORAL_TABLET | Freq: Four times a day (QID) | ORAL | 0 refills | Status: DC | PRN
  Filled 2023-06-09: qty 90, 12d supply, fill #0

## 2023-06-09 NOTE — Assessment & Plan Note (Signed)
 Continue Mektovi  and Braftovi  She has no new symptoms. Zometa  today MRI of brain and PET when returned from cruise

## 2023-06-09 NOTE — Progress Notes (Unsigned)
 [facility] OFFICE PROGRESS NOTE  Patient Care Team: Darnelle Elders, [name] as PCP - General (Family Medicine) Pickenpack-Cousar, Giles Labrum, NP as Nurse Practitioner [facility] and Palliative Medicine)  Diagnosis: 11/25/2022. Diffuse liver, bone, lung metastases. Brain metastases after starting systemic treatment.  Rapidly rising liver enzymes, LDH with profound disease burden. Treatment: 12/11/2022. First line ipi/nivo. Completed 2 cycles.  Admitted for worsening LFT, large amount of metastases in the liver. 12/14/22-12/28/22 radiation to L-spine 30Gy/87fx Needed rapid response due to systemic disease burden 01/09/23  vemurafenib  and cobimetinib due to disease uncontrolled or worsening symptoms, liver metastases. 03/24/23 switched to Braftovi  and Mektovi  due to suboptimal CNS response.  Clinically doing better. Ambulating on her own. Less daytime sleepiness. No back pain today.  Intermittent nausea continue supportive measures as below. Assessment & Plan Cancer, metastatic to bone (HCC) Continue Mektovi  and Braftovi  She has no new symptoms. Zometa  today MRI of brain and PET when returned from cruise Melanoma metastatic to liver [facility]) LFT stable.  CT from ED showed progression. Discuss results and considering change to relatlimab-nivo if PET confirms progression. Continue encorafenib   450 mg once daily with binimetinib  45 mg twice daily. PET in early June CBC, CMP, LDH every visit with follow up  Nausea and vomiting, unspecified vomiting type Continue olanzapine  nightly Continue scopolamine  patch. Compazine  and zofran  and compazine  as needed Continue pepcid  twice daily. Other folate deficiency anemias Folic acid  1 mg daily Decreased appetite Refilled Marinol  today  Return as scheduled.   [name], MD  INTERVAL HISTORY: Patient returns for follow-up. Report of constipation but stool softeners resulted in diarrhea.  No dizziness or headaches or lightheaded.  She walked in herself today. No trouble urinating, coughing.   Not much back pain or hip pain. No trouble urinating.   No change in blurred vision. Not affecting her.  Oncology History  Melanoma metastatic to liver [facility])  11/16/2022 Imaging   She presented to the emergency room on 11/16/2022 for back pain x 2 weeks and central chest pain for several months   CTA CAP IMPRESSION: 1. Negative for aortic dissection or aneurysm. 2. 1.2 cm left upper lobe nodule.  For 3. Multiple ill-defined liver lesions, suspicious for metastatic disease. 4. Multiple lytic osseous lesions, suspicious for metastatic disease. Pathologic fracture of the superior endplate of L5   11/25/2022 Pathology Results   1. Liver, needle/core biopsy, Left hepatic lobe lesion :       - METASTATIC MELANOMA.    12/04/2022 Initial Diagnosis   Melanoma of skin (HCC) Liver, bone metastases. 1.2 cm LUL nodule   12/07/2022 Cancer Staging   Staging form: Melanoma of the Skin, AJCC 8th Edition - Clinical: Stage IV (cTX, cN1, pM1d(0)) - Signed by [name], MD on 03/01/2023   12/09/2022 PET scan   PET: Diffuse bone and liver metastases.  Lung metastases.    12/11/2022 -  Chemotherapy   Patient is on Treatment Plan : MELANOMA Nivolumab  (1) + Ipilimumab  (3) q21d / Nivolumab  (480) q28d     12/17/2022 Imaging   MRI brain 1. Three small right hemisphere brain metastases metastases ranging from 2 mm to 4 mm. Minimal hemosiderin. No associated cerebral edema or mass effect. No other metastatic disease identified in the brain.   2. Known osseous metastatic disease but no destructive lesion identified in the skull or visible cervical spine.   3. Moderate for age cerebral white matter changes most commonly due to small vessel disease.   12/17/2022 Imaging  MRI brain 1. Three small right hemisphere brain metastases metastases ranging from 2 mm to 4 mm. Minimal hemosiderin. No associated cerebral edema or mass effect. No  other metastatic disease identified in the brain.   2. Known osseous metastatic disease but no destructive lesion identified in the skull or visible cervical spine.   3. Moderate for age cerebral white matter changes most commonly due to small vessel disease.   02/15/2023 PET scan   PET whole body  1. The degree metastatic disease defined on recent CT scan is less impressive on FDG PET scan. 2. There is intense hypermetabolic lesion in the lateral segment of the LEFT hepatic lobe which corresponds to a large 3.7 cm mass. Additional small hepatic lesions have mild metabolic activity. 3. Very low metabolic activity associated with a LEFT upper lobe pulmonary nodule which is decreased in size. Favor positive response to therapy. 4. Small LEFT hilar hypermetabolic node is indeterminate. 5. Widespread lytic lesions throughout the axillary appendicular skeleton. Majority these lesions do not have hypermetabolic activity. One soft tissue lesion in the LEFT sacral ala does have mild metabolic activity.      PHYSICAL EXAMINATION: ECOG PERFORMANCE STATUS: 1 - Symptomatic but completely ambulatory  Vitals:   06/10/23 0935  BP: 106/88  Pulse: (!) 126  Resp: 15  Temp: 97.9 F (36.6 C)  SpO2: 100%   Filed Weights   06/10/23 0935  Weight: 96 lb 4.8 oz (43.7 kg)   GENERAL: alert, no distress and comfortable SKIN: skin color normal and no jaundice exposed skin EYES: normal, sclera clear OROPHARYNX: no exudate  NECK: No palpable mass LYMPH:  no palpable cervical lymphadenopathy  LUNGS: clear to auscultation and percussion with normal breathing effort HEART: tachycardic ABDOMEN: abdomen soft, non-tender and nondistended. Musculoskeletal: no edema NEURO: no focal motor/sensory deficits. Strength and sensation equal bilaterally.  Relevant data reviewed during this visit included labs.

## 2023-06-10 ENCOUNTER — Inpatient Hospital Stay

## 2023-06-10 ENCOUNTER — Other Ambulatory Visit (HOSPITAL_BASED_OUTPATIENT_CLINIC_OR_DEPARTMENT_OTHER): Payer: Self-pay

## 2023-06-10 ENCOUNTER — Inpatient Hospital Stay (HOSPITAL_BASED_OUTPATIENT_CLINIC_OR_DEPARTMENT_OTHER)

## 2023-06-10 ENCOUNTER — Other Ambulatory Visit (HOSPITAL_COMMUNITY): Payer: Self-pay

## 2023-06-10 ENCOUNTER — Other Ambulatory Visit: Payer: Self-pay

## 2023-06-10 ENCOUNTER — Encounter: Payer: Self-pay | Admitting: Nurse Practitioner

## 2023-06-10 ENCOUNTER — Encounter: Payer: Self-pay | Admitting: *Deleted

## 2023-06-10 ENCOUNTER — Inpatient Hospital Stay (HOSPITAL_BASED_OUTPATIENT_CLINIC_OR_DEPARTMENT_OTHER): Admitting: Nurse Practitioner

## 2023-06-10 VITALS — BP 106/88 | HR 126 | Temp 97.9°F | Resp 15 | Wt 96.3 lb

## 2023-06-10 DIAGNOSIS — R110 Nausea: Secondary | ICD-10-CM

## 2023-06-10 DIAGNOSIS — Z515 Encounter for palliative care: Secondary | ICD-10-CM | POA: Diagnosis not present

## 2023-06-10 DIAGNOSIS — R112 Nausea with vomiting, unspecified: Secondary | ICD-10-CM | POA: Diagnosis not present

## 2023-06-10 DIAGNOSIS — G893 Neoplasm related pain (acute) (chronic): Secondary | ICD-10-CM

## 2023-06-10 DIAGNOSIS — C787 Secondary malignant neoplasm of liver and intrahepatic bile duct: Secondary | ICD-10-CM

## 2023-06-10 DIAGNOSIS — R630 Anorexia: Secondary | ICD-10-CM | POA: Diagnosis not present

## 2023-06-10 DIAGNOSIS — C7951 Secondary malignant neoplasm of bone: Secondary | ICD-10-CM | POA: Diagnosis not present

## 2023-06-10 DIAGNOSIS — C439 Malignant melanoma of skin, unspecified: Secondary | ICD-10-CM | POA: Diagnosis not present

## 2023-06-10 DIAGNOSIS — D528 Other folate deficiency anemias: Secondary | ICD-10-CM

## 2023-06-10 DIAGNOSIS — I10 Essential (primary) hypertension: Secondary | ICD-10-CM | POA: Diagnosis not present

## 2023-06-10 DIAGNOSIS — R197 Diarrhea, unspecified: Secondary | ICD-10-CM | POA: Diagnosis not present

## 2023-06-10 DIAGNOSIS — N289 Disorder of kidney and ureter, unspecified: Secondary | ICD-10-CM | POA: Diagnosis not present

## 2023-06-10 DIAGNOSIS — E860 Dehydration: Secondary | ICD-10-CM

## 2023-06-10 DIAGNOSIS — Z79620 Long term (current) use of immunosuppressive biologic: Secondary | ICD-10-CM | POA: Diagnosis not present

## 2023-06-10 DIAGNOSIS — M1990 Unspecified osteoarthritis, unspecified site: Secondary | ICD-10-CM | POA: Diagnosis not present

## 2023-06-10 DIAGNOSIS — K5900 Constipation, unspecified: Secondary | ICD-10-CM | POA: Diagnosis not present

## 2023-06-10 DIAGNOSIS — C7800 Secondary malignant neoplasm of unspecified lung: Secondary | ICD-10-CM | POA: Diagnosis not present

## 2023-06-10 DIAGNOSIS — Z5112 Encounter for antineoplastic immunotherapy: Secondary | ICD-10-CM | POA: Diagnosis not present

## 2023-06-10 DIAGNOSIS — C7931 Secondary malignant neoplasm of brain: Secondary | ICD-10-CM | POA: Diagnosis not present

## 2023-06-10 DIAGNOSIS — R530 Neoplastic (malignant) related fatigue: Secondary | ICD-10-CM

## 2023-06-10 DIAGNOSIS — D649 Anemia, unspecified: Secondary | ICD-10-CM | POA: Diagnosis not present

## 2023-06-10 LAB — LACTATE DEHYDROGENASE: LDH: 1190 U/L — ABNORMAL HIGH (ref 98–192)

## 2023-06-10 LAB — CBC WITH DIFFERENTIAL (CANCER CENTER ONLY)
Abs Immature Granulocytes: 0.02 10*3/uL (ref 0.00–0.07)
Basophils Absolute: 0 10*3/uL (ref 0.0–0.1)
Basophils Relative: 1 %
Eosinophils Absolute: 0.1 10*3/uL (ref 0.0–0.5)
Eosinophils Relative: 1 %
HCT: 31.8 % — ABNORMAL LOW (ref 36.0–46.0)
Hemoglobin: 10.4 g/dL — ABNORMAL LOW (ref 12.0–15.0)
Immature Granulocytes: 0 %
Lymphocytes Relative: 18 %
Lymphs Abs: 1.2 10*3/uL (ref 0.7–4.0)
MCH: 29.3 pg (ref 26.0–34.0)
MCHC: 32.7 g/dL (ref 30.0–36.0)
MCV: 89.6 fL (ref 80.0–100.0)
Monocytes Absolute: 0.5 10*3/uL (ref 0.1–1.0)
Monocytes Relative: 7 %
Neutro Abs: 4.9 10*3/uL (ref 1.7–7.7)
Neutrophils Relative %: 73 %
Platelet Count: 505 10*3/uL — ABNORMAL HIGH (ref 150–400)
RBC: 3.55 MIL/uL — ABNORMAL LOW (ref 3.87–5.11)
RDW: 13.4 % (ref 11.5–15.5)
WBC Count: 6.7 10*3/uL (ref 4.0–10.5)
nRBC: 0 % (ref 0.0–0.2)

## 2023-06-10 LAB — CMP (CANCER CENTER ONLY)
ALT: 11 U/L (ref 0–44)
AST: 29 U/L (ref 15–41)
Albumin: 3.4 g/dL — ABNORMAL LOW (ref 3.5–5.0)
Alkaline Phosphatase: 122 U/L (ref 38–126)
Anion gap: 7 (ref 5–15)
BUN: 13 mg/dL (ref 6–20)
CO2: 24 mmol/L (ref 22–32)
Calcium: 8.5 mg/dL — ABNORMAL LOW (ref 8.9–10.3)
Chloride: 103 mmol/L (ref 98–111)
Creatinine: 1.03 mg/dL — ABNORMAL HIGH (ref 0.44–1.00)
GFR, Estimated: >60 mL/min (ref >60)
Glucose, Bld: 93 mg/dL (ref 70–99)
Potassium: 4.2 mmol/L (ref 3.5–5.1)
Sodium: 134 mmol/L — ABNORMAL LOW (ref 135–145)
Total Bilirubin: 0.5 mg/dL (ref 0.0–1.2)
Total Protein: 6.3 g/dL — ABNORMAL LOW (ref 6.5–8.1)

## 2023-06-10 LAB — TSH: TSH: 1.96 u[IU]/mL (ref 0.350–4.500)

## 2023-06-10 LAB — T4, FREE: Free T4: 1.44 ng/dL — ABNORMAL HIGH (ref 0.61–1.12)

## 2023-06-10 MED ORDER — HEPARIN SOD (PORK) LOCK FLUSH 100 UNIT/ML IV SOLN
500.0000 [IU] | Freq: Once | INTRAVENOUS | Status: AC | PRN
  Administered 2023-06-10: 500 [IU]

## 2023-06-10 MED ORDER — SODIUM CHLORIDE 0.9% FLUSH
10.0000 mL | Freq: Once | INTRAVENOUS | Status: AC | PRN
Start: 2023-06-10 — End: 2023-06-10
  Administered 2023-06-10: 10 mL

## 2023-06-10 MED ORDER — ZOLEDRONIC ACID 4 MG/100ML IV SOLN
4.0000 mg | Freq: Once | INTRAVENOUS | Status: AC
  Administered 2023-06-10: 4 mg via INTRAVENOUS
  Filled 2023-06-10: qty 100

## 2023-06-10 MED ORDER — LIDOCAINE-PRILOCAINE 2.5-2.5 % EX CREA
1.0000 | TOPICAL_CREAM | CUTANEOUS | 6 refills | Status: DC | PRN
  Filled 2023-06-10: qty 30, 10d supply, fill #0
  Filled 2023-07-05: qty 30, 10d supply, fill #1

## 2023-06-10 MED ORDER — SODIUM CHLORIDE 0.9% FLUSH
10.0000 mL | INTRAVENOUS | Status: DC | PRN
  Administered 2023-06-10: 10 mL

## 2023-06-10 MED ORDER — SODIUM CHLORIDE 0.9 % IV SOLN
480.0000 mg | Freq: Once | INTRAVENOUS | Status: AC
  Administered 2023-06-10: 480 mg via INTRAVENOUS
  Filled 2023-06-10: qty 48

## 2023-06-10 MED ORDER — DRONABINOL 10 MG PO CAPS
10.0000 mg | ORAL_CAPSULE | Freq: Three times a day (TID) | ORAL | 0 refills | Status: DC
  Filled 2023-06-10: qty 90, 30d supply, fill #0

## 2023-06-10 MED ORDER — SODIUM CHLORIDE 0.9 % IV SOLN: INTRAVENOUS | Status: DC

## 2023-06-10 MED ORDER — TRIAMCINOLONE ACETONIDE 0.5 % EX OINT
1.0000 | TOPICAL_OINTMENT | Freq: Two times a day (BID) | CUTANEOUS | 0 refills | Status: AC
Start: 2023-06-10 — End: ?
  Filled 2023-06-10: qty 30, 15d supply, fill #0

## 2023-06-10 NOTE — Assessment & Plan Note (Addendum)
 LFT stable.  CT from ED showed progression. Discuss results and considering change to relatlimab-nivo if PET confirms progression. Continue encorafenib   450 mg once daily with binimetinib  45 mg twice daily. PET in early June CBC, CMP, LDH every visit with follow up

## 2023-06-10 NOTE — Assessment & Plan Note (Addendum)
Folic acid 1mg daily

## 2023-06-10 NOTE — Assessment & Plan Note (Addendum)
 Refilled Marinol  today

## 2023-06-10 NOTE — Patient Instructions (Signed)
 [facility] - A DEPT OF [facility]  Discharge Instructions: Thank you for choosing [facility] to provide your oncology and hematology care.   If you have a lab appointment with the Cancer Center, please go directly to the Cancer Center and check in at the registration area.   Wear comfortable clothing and clothing appropriate for easy access to any Portacath or PICC line.   We strive to give you quality time with your provider. You may need to reschedule your appointment if you arrive late (15 or more minutes).  Arriving late affects you and other patients whose appointments are after yours.  Also, if you miss three or more appointments without notifying the office, you may be dismissed from the clinic at the provider's discretion.      For prescription refill requests, have your pharmacy contact our office and allow 72 hours for refills to be completed.    Today you received the following chemotherapy and/or immunotherapy agents: Opdivo      To help prevent nausea and vomiting after your treatment, we encourage you to take your nausea medication as directed.  BELOW ARE SYMPTOMS THAT SHOULD BE REPORTED IMMEDIATELY: *FEVER GREATER THAN 100.4 F (38 C) OR HIGHER *CHILLS OR SWEATING *NAUSEA AND VOMITING THAT IS NOT CONTROLLED WITH YOUR NAUSEA MEDICATION *UNUSUAL SHORTNESS OF BREATH *UNUSUAL BRUISING OR BLEEDING *URINARY PROBLEMS (pain or burning when urinating, or frequent urination) *BOWEL PROBLEMS (unusual diarrhea, constipation, pain near the anus) TENDERNESS IN MOUTH AND THROAT WITH OR WITHOUT PRESENCE OF ULCERS (sore throat, sores in mouth, or a toothache) UNUSUAL RASH, SWELLING OR PAIN  UNUSUAL VAGINAL DISCHARGE OR ITCHING   Items with * indicate a potential emergency and should be followed up as soon as possible or go to the Emergency Department if any problems should occur.  Please show the CHEMOTHERAPY ALERT CARD or IMMUNOTHERAPY  ALERT CARD at check-in to the Emergency Department and triage nurse.  Should you have questions after your visit or need to cancel or reschedule your appointment, please contact [facility] - A DEPT OF [facility]  Dept: [phone]  and follow the prompts.  Office hours are 8:00 a.m. to 4:30 p.m. Monday - Friday. Please note that voicemails left after 4:00 p.m. may not be returned until the following business day.  We are closed weekends and major holidays. You have access to a nurse at all times for urgent questions. Please call the main number to the clinic Dept: [phone] and follow the prompts.   For any non-urgent questions, you may also contact your provider using MyChart. We now offer e-Visits for anyone 57 and older to request care online for non-urgent symptoms. For details visit [email or web address].   Also download the MyChart app! Go to the app store, search "MyChart", open the app, select , and log in with your MyChart username and password.

## 2023-06-10 NOTE — Assessment & Plan Note (Addendum)
 Continue olanzapine  nightly Continue scopolamine  patch. Compazine  and zofran  and compazine  as needed Continue pepcid  twice daily.

## 2023-06-10 NOTE — Progress Notes (Signed)
 Palliative Medicine [facility]  [phone] [phone]   Name: Laura [name] Date: 06/10/2023 MRN: [id number]  DOB: 05-Jan-1966  Laura [name] Care Team: Darnelle Elders, [name] as PCP - General (Family Medicine) Pickenpack-Cousar, Giles Labrum, NP as Nurse Practitioner [facility] and Palliative Medicine)    INTERVAL HISTORY: Laura [name] is a 58 y.o. female with oncologic medical history including malignant melanoma (10/2022) with metastatic disease to the bone. As well as a history of hypertension, hyperlipidemia, GERD, and arthritis. Palliative ask to see for symptom management and goals of care.   SOCIAL HISTORY:     reports that she quit smoking about 7 years ago. Her smoking use included cigarettes. She started smoking about 27 years ago. She has a 20 pack-year smoking history. She has never used smokeless tobacco. She reports that she does not drink alcohol and does not use drugs.  ADVANCE DIRECTIVES:  None on file   CODE STATUS: Full code  PAST MEDICAL HISTORY: Past Medical History:  Diagnosis Date   Anxiety    Arthritis    Complication of anesthesia    Fibromyalgia    Gallstones 04/20/2018   GERD (gastroesophageal reflux disease)    History of blood transfusion    post Hysterectomy   Hypercholesteremia    Melanoma (HCC)    on back and excised   Metastatic melanoma (HCC)    Migraine    PONV (postoperative nausea and vomiting)    Tobacco abuse    Vertebral artery disease (HCC)    history of left vertebral artery dissection 2011    ALLERGIES:  is allergic to aspirin and tylenol  [acetaminophen ].  MEDICATIONS:  Current Outpatient Medications  Medication Sig Dispense Refill   binimetinib  (MEKTOVI ) 15 MG tablet Take 3 tablets (45 mg total) by mouth 2 (two) times daily. 180 tablet 11   calcium  carbonate (OS-CAL - DOSED IN MG OF ELEMENTAL CALCIUM ) 1250 (500 Ca) MG tablet Take 1 tablet by mouth daily at 2 PM. Pt takes w/ Vitamin D      docusate sodium  (COLACE) 100 MG capsule Take 100-200 mg by mouth daily.     dronabinol  (MARINOL ) 10 MG capsule Take 1 capsule (10 mg total) by mouth 3 (three) times daily. 90 capsule 0   encorafenib  (BRAFTOVI ) 75 MG capsule Take 6 capsules (450 mg total) by mouth daily. 180 capsule 11   famotidine  (PEPCID ) 20 MG tablet Take 20 mg by mouth 2 (two) times daily.     fentaNYL  (DURAGESIC ) 25 MCG/HR Place 1 patch onto the skin every 3 (three) days. 10 patch 0   gabapentin  (NEURONTIN ) 100 MG capsule Take 1 capsule (100 mg total) by mouth 2 (two) times daily. 60 capsule 0   glycopyrrolate  (ROBINUL ) 1 MG tablet Take 1 tablet (1 mg total) by mouth 2 (two) times daily as needed. 30 tablet 2   hydrOXYzine (ATARAX) 25 MG tablet Take 25 mg by mouth daily.     ibuprofen  (ADVIL ) 600 MG tablet Take 1 tablet (600 mg total) by mouth every 6 (six) hours as needed for fever, headache, or mild pain. 30 tablet 0   lidocaine -prilocaine  (EMLA ) cream Apply to port site 30-45 min prior to appointment. 30 g 6   OLANZapine  (ZYPREXA ) 2.5 MG tablet Take 1 tablet (2.5 mg total) by mouth at bedtime. 30 tablet 1   ondansetron  (ZOFRAN -ODT) 8 MG disintegrating tablet Dissolve 1 tablet (8 mg total) by mouth every 8 (eight) hours as needed for nausea or vomiting. 60  tablet 2   potassium chloride  (KLOR-CON ) 20 MEQ packet Mix 1 packet (20 mEq) and take by mouth 2 (two) times daily as directed. 60 packet 3   prochlorperazine  (COMPAZINE ) 10 MG tablet Take 1 tablet (10 mg total) by mouth every 6 (six) hours as needed for nausea or vomiting. 30 tablet 2   rosuvastatin  (CRESTOR ) 10 MG tablet Take 10 mg by mouth at bedtime.     scopolamine  (TRANSDERM-SCOP) 1 MG/3DAYS Place 1 patch (1.5 mg total) onto the skin every 3 (three) days. 10 patch 12   traMADol  (ULTRAM ) 50 MG tablet Take 1-2 tablets (50-100 mg total) by mouth every 6 (six) hours as needed for moderate pain (pain score 4-6) or severe pain (pain score 7-10). 90 tablet 0    triamcinolone  ointment (KENALOG ) 0.5 % Apply topically to affected area twice daily 30 g 0   zolpidem  (AMBIEN  CR) 12.5 MG CR tablet Take 1 tablet (12.5 mg total) by mouth at bedtime as needed for sleep. 30 tablet 2   No current facility-administered medications for this visit.    VITAL SIGNS: There were no vitals taken for this visit. There were no vitals filed for this visit.  Estimated body mass index is 17.61 kg/m as calculated from the following:   Height as of 05/31/23: 5\' 2"  (1.575 m).   Weight as of an earlier encounter on 06/10/23: 96 lb 4.8 oz (43.7 kg).   PERFORMANCE STATUS (ECOG) : 1 - Symptomatic but completely ambulatory  Physical Exam General: NAD Cardiovascular: regular rate and rhythm Pulmonary: normal breathing pattern Extremities: no edema, no joint deformities Skin: no rashes Neurological: AAO x3  IMPRESSION: Discussed the use of AI scribe software for clinical note transcription with the Laura [name], who gave verbal consent to proceed.  History of Present Illness Laura [name] is a 58 year old female who presents to clinic for symptom management follow-up.  No acute distress. Accompanied by her son. Ongoing fatigue however tries to remain as active as possible. She shares upcoming cruise which she is looking forward to. We discussed documentation for need of medications and increased hydration.   Her appetite fluctuates. Some days are better than others. Reports ongoing nausea which affects her appetite. Current weight 96 lbs. She is taking olanzapine  2.5 mg at bedtime in addition to as needed Zofran  and compazine . We discussed increasing olanzapine  to 5mg  at bedtime. Laura [name] also has Robinul  as needed. Scopolamine  patch in place. Continues to listen to her body as overexertion increases nausea.   We discussed her pain at length.  Laura [name] pain management includes tramadol ,  two tablets every four hours as needed, and a 25 mcg fentanyl  patch. Gabapentin  100 mg to 3  times daily. Tolerating without difficulty. Pain controlled overall on current regimen. Will continue to closely monitor.    I discussed the importance of continued conversation with family and their medical providers regarding overall plan of care and treatment options, ensuring decisions are within the context of the patients values and GOCs. Assessment & Plan Cancer related pain Pain managed with fentanyl  patch and tramadol .  Recent ED visit due to increased pain. - Fentanyl  patch 25 mcg.  Discussed possibility of increasing to 37 mcg if pain worsens on current regimen. -Gabapentin  100 mg 3 times daily - Continue tramadol  50-100 mg every 4-6 hours as needed.   - Contact provider via MyChart for any issues before follow-up.  Nausea and vomiting Intermittent symptoms improved with current management. -Continue famotidine  20 mg twice daily -  Robinul  1 mg twice daily as needed -Increase Olanzapine  5 mg at bedtime -Ondansetron  8 mg every 8 hours as needed -Promethazine  25 mg every 8 hours as needed -Scopolamine  patch 1.5 change every 72 hours  Nutritional intake Improvement noted but intake still suboptimal. - Encourage improved nutritional intake and monitor  I will plan to see Laura [name] back in the clinic 3-4 weeks.  Sooner if needed.  Laura [name] expressed understanding and was in agreement with this plan. She also understands that She can call the clinic at any time with any questions, concerns, or complaints.   Any controlled substances utilized were prescribed in the context of palliative care. PDMP has been reviewed.   Visit consisted of counseling and education dealing with the complex and emotionally intense issues of symptom management and palliative care in the setting of serious and potentially life-threatening illness.  Dellia Ferguson, AGPCNP-BC  Palliative Medicine Team/[facility]

## 2023-06-17 ENCOUNTER — Inpatient Hospital Stay

## 2023-06-17 ENCOUNTER — Other Ambulatory Visit: Payer: Self-pay | Admitting: Nurse Practitioner

## 2023-06-17 ENCOUNTER — Other Ambulatory Visit (HOSPITAL_COMMUNITY): Payer: Self-pay

## 2023-06-17 VITALS — BP 91/72 | HR 134 | Temp 97.7°F | Resp 15

## 2023-06-17 DIAGNOSIS — C787 Secondary malignant neoplasm of liver and intrahepatic bile duct: Secondary | ICD-10-CM

## 2023-06-17 DIAGNOSIS — Z5112 Encounter for antineoplastic immunotherapy: Secondary | ICD-10-CM | POA: Diagnosis not present

## 2023-06-17 DIAGNOSIS — D649 Anemia, unspecified: Secondary | ICD-10-CM | POA: Diagnosis not present

## 2023-06-17 DIAGNOSIS — C7951 Secondary malignant neoplasm of bone: Secondary | ICD-10-CM

## 2023-06-17 DIAGNOSIS — E860 Dehydration: Secondary | ICD-10-CM

## 2023-06-17 DIAGNOSIS — C439 Malignant melanoma of skin, unspecified: Secondary | ICD-10-CM | POA: Diagnosis not present

## 2023-06-17 DIAGNOSIS — K5900 Constipation, unspecified: Secondary | ICD-10-CM | POA: Diagnosis not present

## 2023-06-17 DIAGNOSIS — N289 Disorder of kidney and ureter, unspecified: Secondary | ICD-10-CM | POA: Diagnosis not present

## 2023-06-17 DIAGNOSIS — G893 Neoplasm related pain (acute) (chronic): Secondary | ICD-10-CM

## 2023-06-17 DIAGNOSIS — M1990 Unspecified osteoarthritis, unspecified site: Secondary | ICD-10-CM | POA: Diagnosis not present

## 2023-06-17 DIAGNOSIS — C7800 Secondary malignant neoplasm of unspecified lung: Secondary | ICD-10-CM | POA: Diagnosis not present

## 2023-06-17 DIAGNOSIS — Z79620 Long term (current) use of immunosuppressive biologic: Secondary | ICD-10-CM | POA: Diagnosis not present

## 2023-06-17 DIAGNOSIS — R197 Diarrhea, unspecified: Secondary | ICD-10-CM | POA: Diagnosis not present

## 2023-06-17 DIAGNOSIS — Z515 Encounter for palliative care: Secondary | ICD-10-CM | POA: Diagnosis not present

## 2023-06-17 DIAGNOSIS — C7931 Secondary malignant neoplasm of brain: Secondary | ICD-10-CM | POA: Diagnosis not present

## 2023-06-17 DIAGNOSIS — R112 Nausea with vomiting, unspecified: Secondary | ICD-10-CM | POA: Diagnosis not present

## 2023-06-17 DIAGNOSIS — I10 Essential (primary) hypertension: Secondary | ICD-10-CM | POA: Diagnosis not present

## 2023-06-17 MED ORDER — PREDNISONE 5 MG PO TABS
5.0000 mg | ORAL_TABLET | Freq: Every day | ORAL | 0 refills | Status: DC
Start: 2023-06-17 — End: 2023-06-28
  Filled 2023-06-17: qty 10, 10d supply, fill #0

## 2023-06-17 MED ORDER — HEPARIN SOD (PORK) LOCK FLUSH 100 UNIT/ML IV SOLN
500.0000 [IU] | Freq: Once | INTRAVENOUS | Status: AC | PRN
  Administered 2023-06-17: 500 [IU]

## 2023-06-17 MED ORDER — SODIUM CHLORIDE 0.9 % IV SOLN: INTRAVENOUS | Status: AC

## 2023-06-17 MED ORDER — SODIUM CHLORIDE 0.9% FLUSH
10.0000 mL | Freq: Once | INTRAVENOUS | Status: AC | PRN
  Administered 2023-06-17: 10 mL

## 2023-06-17 NOTE — Progress Notes (Signed)
 Patient experiencing 10/10 back pain, requested fluids to be sped up to decrease time in chair as she uncomfortable. This RN reached out to Dr Alita Irwin who approved increase in fluid rate to 999 ml/hr.

## 2023-06-17 NOTE — Patient Instructions (Signed)

## 2023-06-24 ENCOUNTER — Other Ambulatory Visit

## 2023-06-24 ENCOUNTER — Ambulatory Visit

## 2023-06-28 ENCOUNTER — Encounter (HOSPITAL_COMMUNITY)
Admission: RE | Admit: 2023-06-28 | Discharge: 2023-06-28 | Disposition: A | Discharge status: Home / Self Care | Source: Ambulatory Visit

## 2023-06-28 ENCOUNTER — Inpatient Hospital Stay: Attending: Physician Assistant

## 2023-06-28 ENCOUNTER — Telehealth: Payer: Self-pay

## 2023-06-28 ENCOUNTER — Other Ambulatory Visit: Payer: Self-pay

## 2023-06-28 ENCOUNTER — Other Ambulatory Visit (HOSPITAL_COMMUNITY): Payer: Self-pay

## 2023-06-28 ENCOUNTER — Other Ambulatory Visit: Payer: Self-pay | Admitting: Nurse Practitioner

## 2023-06-28 VITALS — BP 115/72 | HR 113 | Temp 97.3°F | Resp 16 | Wt 98.3 lb

## 2023-06-28 DIAGNOSIS — N289 Disorder of kidney and ureter, unspecified: Secondary | ICD-10-CM | POA: Insufficient documentation

## 2023-06-28 DIAGNOSIS — C7951 Secondary malignant neoplasm of bone: Secondary | ICD-10-CM | POA: Diagnosis not present

## 2023-06-28 DIAGNOSIS — E876 Hypokalemia: Secondary | ICD-10-CM | POA: Insufficient documentation

## 2023-06-28 DIAGNOSIS — C439 Malignant melanoma of skin, unspecified: Secondary | ICD-10-CM | POA: Diagnosis not present

## 2023-06-28 DIAGNOSIS — C787 Secondary malignant neoplasm of liver and intrahepatic bile duct: Secondary | ICD-10-CM | POA: Insufficient documentation

## 2023-06-28 DIAGNOSIS — R1310 Dysphagia, unspecified: Secondary | ICD-10-CM | POA: Diagnosis not present

## 2023-06-28 DIAGNOSIS — C7800 Secondary malignant neoplasm of unspecified lung: Secondary | ICD-10-CM | POA: Diagnosis not present

## 2023-06-28 DIAGNOSIS — E860 Dehydration: Secondary | ICD-10-CM

## 2023-06-28 DIAGNOSIS — Z515 Encounter for palliative care: Secondary | ICD-10-CM | POA: Diagnosis not present

## 2023-06-28 DIAGNOSIS — C7931 Secondary malignant neoplasm of brain: Secondary | ICD-10-CM | POA: Diagnosis not present

## 2023-06-28 DIAGNOSIS — R5383 Other fatigue: Secondary | ICD-10-CM | POA: Insufficient documentation

## 2023-06-28 LAB — GLUCOSE, CAPILLARY: Glucose-Capillary: 104 mg/dL — ABNORMAL HIGH (ref 70–99)

## 2023-06-28 MED ORDER — SODIUM CHLORIDE 0.9% FLUSH
10.0000 mL | Freq: Once | INTRAVENOUS | Status: AC | PRN
  Administered 2023-06-28: 10 mL

## 2023-06-28 MED ORDER — HEPARIN SOD (PORK) LOCK FLUSH 100 UNIT/ML IV SOLN
500.0000 [IU] | Freq: Once | INTRAVENOUS | Status: AC | PRN
  Administered 2023-06-28: 500 [IU]

## 2023-06-28 MED ORDER — FENTANYL 25 MCG/HR TD PT72
1.0000 | MEDICATED_PATCH | TRANSDERMAL | 0 refills | Status: DC
  Filled 2023-06-28 – 2023-07-05 (×2): qty 10, 30d supply, fill #0

## 2023-06-28 MED ORDER — SODIUM CHLORIDE 0.9 % IV SOLN: INTRAVENOUS | Status: AC

## 2023-06-28 MED ORDER — FLUDEOXYGLUCOSE F - 18 (FDG) INJECTION
5.0000 | Freq: Once | INTRAVENOUS | Status: AC
  Administered 2023-06-28: 5 via INTRAVENOUS

## 2023-06-28 MED ORDER — ONDANSETRON HCL 4 MG/2ML IJ SOLN
8.0000 mg | Freq: Once | INTRAMUSCULAR | Status: AC
  Administered 2023-06-28: 8 mg via INTRAVENOUS
  Filled 2023-06-28: qty 4

## 2023-06-28 NOTE — Patient Instructions (Signed)

## 2023-06-28 NOTE — Telephone Encounter (Signed)
 T/C from pt's husband stating pt does not want the potassium in packets and wants to go back to taking the pills.Potassium chloride  20 mEq twice daily. RX was changed to a packet due to n/v    OK to change back? Please advise

## 2023-06-28 NOTE — Progress Notes (Signed)
 Patient denies chest pain, dizziness, or shortness of breath upon discharge.  Patient ambulatory upon discharge and presenting in no acute distress.

## 2023-06-29 ENCOUNTER — Other Ambulatory Visit (HOSPITAL_COMMUNITY): Payer: Self-pay

## 2023-06-29 ENCOUNTER — Other Ambulatory Visit

## 2023-06-29 ENCOUNTER — Other Ambulatory Visit: Payer: Self-pay

## 2023-06-29 ENCOUNTER — Other Ambulatory Visit (HOSPITAL_BASED_OUTPATIENT_CLINIC_OR_DEPARTMENT_OTHER): Payer: Self-pay

## 2023-06-29 ENCOUNTER — Encounter (HOSPITAL_COMMUNITY): Payer: Self-pay | Admitting: *Deleted

## 2023-06-29 ENCOUNTER — Other Ambulatory Visit: Payer: Self-pay | Admitting: *Deleted

## 2023-06-29 DIAGNOSIS — E876 Hypokalemia: Secondary | ICD-10-CM

## 2023-06-29 DIAGNOSIS — C787 Secondary malignant neoplasm of liver and intrahepatic bile duct: Secondary | ICD-10-CM

## 2023-06-29 MED ORDER — POTASSIUM CHLORIDE CRYS ER 10 MEQ PO TBCR
20.0000 meq | EXTENDED_RELEASE_TABLET | Freq: Two times a day (BID) | ORAL | 1 refills | Status: DC
  Filled 2023-06-29: qty 120, 30d supply, fill #0

## 2023-06-29 MED ORDER — POTASSIUM CHLORIDE 20 MEQ PO PACK
20.0000 meq | PACK | Freq: Two times a day (BID) | ORAL | 3 refills | Status: DC
  Filled 2023-06-29: qty 60, 30d supply, fill #0

## 2023-06-29 MED ORDER — POTASSIUM CHLORIDE CRYS ER 20 MEQ PO TBCR
20.0000 meq | EXTENDED_RELEASE_TABLET | Freq: Two times a day (BID) | ORAL | 3 refills | Status: DC
  Filled 2023-06-29: qty 60, 30d supply, fill #0

## 2023-06-29 NOTE — Progress Notes (Signed)
 PCP - Darnelle Elders, [name] Cardiologist - denies  PPM/ICD - denies   Chest x-ray - 06/02/23 EKG - 04/06/23 Stress Test - denies ECHO - denies Cardiac Cath - denies  CPAP - denies  DM- denies  ASA/Blood Thinner Instructions: n/a   ERAS Protcol - clears until 0615  COVID TEST- n/a  Anesthesia review: yes, pt's husband states that the pt's heart rate "usually always runs high" anymore from 120-150.   Patient verbally denies any shortness of breath, fever, cough and chest pain during phone call       Questions were answered. Patient verbalized understanding of instructions.

## 2023-06-29 NOTE — Pre-Procedure Instructions (Signed)
-------------    SDW INSTRUCTIONS given:  Your procedure is scheduled on 6/5.  Report to [facility] Main Entrance "A" at 06:45 A.M., and check in at the Admitting office.  Any questions or running late day of surgery: call [phone]    Remember:  Do not eat after midnight the night before your surgery  You may drink clear liquids until 06:15 the morning of your surgery.   Clear liquids allowed are: Water , Non-Citrus Juices (without pulp), Carbonated Beverages, Clear Tea, Black Coffee Only, and Gatorade    Take these medicines the morning of surgery with A SIP OF WATER   mektovi , zyrtec, braftovi , famotidine , compazine          May take these medicines IF NEEDED: robinul , hydroxyzine, zofran , tramadol    As of today, STOP taking any Aspirin (unless otherwise instructed by your surgeon) Aleve, Naproxen, Ibuprofen , Motrin , Advil , Goody's, BC's, all herbal medications, fish oil, and all vitamins.   Do NOT Smoke (Tobacco/Vaping) 24 hours prior to your procedure  If you use a CPAP at night, you may bring all equipment for your overnight stay.     You will be asked to remove any contacts, glasses, piercing's, hearing aid's, dentures/partials prior to surgery. Please bring cases for these items if needed.     Patients discharged the day of surgery will not be allowed to drive home, and someone needs to stay with them for 24 hours.  SURGICAL WAITING ROOM VISITATION Patients may have no more than 2 support people in the waiting area - these visitors may rotate.   Pre-op nurse will coordinate an appropriate time for 1 ADULT support person, who may not rotate, to accompany patient in pre-op.  Children under the age of 53 must have an adult with them who is not the patient and must remain in the main waiting area with an adult.  If the patient needs to stay at the hospital during part of their recovery, the visitor guidelines for inpatient rooms apply.  Please refer to the [facility] website  for the visitor guidelines for any additional information.   Special instructions:   [place]- Preparing For Surgery   Please follow these instructions carefully.   Shower the NIGHT BEFORE SURGERY and the MORNING OF SURGERY with DIAL Soap.   Pat yourself dry with a CLEAN TOWEL.  Wear CLEAN PAJAMAS to bed the night before surgery  Place CLEAN SHEETS on your bed the night of your first shower and DO NOT SLEEP WITH PETS.   Additional instructions for the day of surgery: DO NOT APPLY any lotions, deodorants, cologne, or perfumes.   Do not wear jewelry or makeup Do not wear nail polish, gel polish, artificial nails, or any other type of covering on natural nails (fingers and toes) Do not bring valuables to the hospital. [facility] is not responsible for valuables/personal belongings. Put on clean/comfortable clothes.  Please brush your teeth.  Ask your nurse before applying any prescription medications to the skin.

## 2023-06-30 ENCOUNTER — Other Ambulatory Visit (HOSPITAL_COMMUNITY): Payer: Self-pay

## 2023-06-30 ENCOUNTER — Inpatient Hospital Stay (HOSPITAL_BASED_OUTPATIENT_CLINIC_OR_DEPARTMENT_OTHER)

## 2023-06-30 ENCOUNTER — Inpatient Hospital Stay

## 2023-06-30 VITALS — BP 110/94 | HR 124 | Temp 97.7°F | Resp 18 | Wt 97.5 lb

## 2023-06-30 DIAGNOSIS — R1310 Dysphagia, unspecified: Secondary | ICD-10-CM | POA: Diagnosis not present

## 2023-06-30 DIAGNOSIS — R5383 Other fatigue: Secondary | ICD-10-CM | POA: Diagnosis not present

## 2023-06-30 DIAGNOSIS — N289 Disorder of kidney and ureter, unspecified: Secondary | ICD-10-CM

## 2023-06-30 DIAGNOSIS — Z515 Encounter for palliative care: Secondary | ICD-10-CM | POA: Diagnosis not present

## 2023-06-30 DIAGNOSIS — R630 Anorexia: Secondary | ICD-10-CM

## 2023-06-30 DIAGNOSIS — C787 Secondary malignant neoplasm of liver and intrahepatic bile duct: Secondary | ICD-10-CM

## 2023-06-30 DIAGNOSIS — C7800 Secondary malignant neoplasm of unspecified lung: Secondary | ICD-10-CM | POA: Diagnosis not present

## 2023-06-30 DIAGNOSIS — C7931 Secondary malignant neoplasm of brain: Secondary | ICD-10-CM | POA: Diagnosis not present

## 2023-06-30 DIAGNOSIS — E876 Hypokalemia: Secondary | ICD-10-CM | POA: Diagnosis not present

## 2023-06-30 DIAGNOSIS — C439 Malignant melanoma of skin, unspecified: Secondary | ICD-10-CM | POA: Diagnosis not present

## 2023-06-30 DIAGNOSIS — C7951 Secondary malignant neoplasm of bone: Secondary | ICD-10-CM | POA: Diagnosis not present

## 2023-06-30 LAB — CBC WITH DIFFERENTIAL (CANCER CENTER ONLY)
Abs Immature Granulocytes: 0.09 10*3/uL — ABNORMAL HIGH (ref 0.00–0.07)
Basophils Absolute: 0 10*3/uL (ref 0.0–0.1)
Basophils Relative: 0 %
Eosinophils Absolute: 0 10*3/uL (ref 0.0–0.5)
Eosinophils Relative: 0 %
HCT: 33.4 % — ABNORMAL LOW (ref 36.0–46.0)
Hemoglobin: 10.9 g/dL — ABNORMAL LOW (ref 12.0–15.0)
Immature Granulocytes: 1 %
Lymphocytes Relative: 9 %
Lymphs Abs: 0.8 10*3/uL (ref 0.7–4.0)
MCH: 28.5 pg (ref 26.0–34.0)
MCHC: 32.6 g/dL (ref 30.0–36.0)
MCV: 87.4 fL (ref 80.0–100.0)
Monocytes Absolute: 0.4 10*3/uL (ref 0.1–1.0)
Monocytes Relative: 5 %
Neutro Abs: 7.2 10*3/uL (ref 1.7–7.7)
Neutrophils Relative %: 85 %
Platelet Count: 442 10*3/uL — ABNORMAL HIGH (ref 150–400)
RBC: 3.82 MIL/uL — ABNORMAL LOW (ref 3.87–5.11)
RDW: 13.7 % (ref 11.5–15.5)
WBC Count: 8.5 10*3/uL (ref 4.0–10.5)
nRBC: 0 % (ref 0.0–0.2)

## 2023-06-30 LAB — CMP (CANCER CENTER ONLY)
ALT: 29 U/L (ref 0–44)
AST: 70 U/L — ABNORMAL HIGH (ref 15–41)
Albumin: 3.5 g/dL (ref 3.5–5.0)
Alkaline Phosphatase: 272 U/L — ABNORMAL HIGH (ref 38–126)
Anion gap: 5 (ref 5–15)
BUN: 19 mg/dL (ref 6–20)
CO2: 30 mmol/L (ref 22–32)
Calcium: 9.4 mg/dL (ref 8.9–10.3)
Chloride: 102 mmol/L (ref 98–111)
Creatinine: 1.88 mg/dL — ABNORMAL HIGH (ref 0.44–1.00)
GFR, Estimated: 31 mL/min — ABNORMAL LOW (ref >60)
Glucose, Bld: 114 mg/dL — ABNORMAL HIGH (ref 70–99)
Potassium: 2.9 mmol/L — ABNORMAL LOW (ref 3.5–5.1)
Sodium: 137 mmol/L (ref 135–145)
Total Bilirubin: 0.7 mg/dL (ref 0.0–1.2)
Total Protein: 6.8 g/dL (ref 6.5–8.1)

## 2023-06-30 LAB — LACTATE DEHYDROGENASE: LDH: 2341 U/L — ABNORMAL HIGH (ref 98–192)

## 2023-06-30 NOTE — Assessment & Plan Note (Addendum)
 Potassium chloride  20 mEq twice daily.  Refilled 10 mEq tabs

## 2023-06-30 NOTE — Assessment & Plan Note (Addendum)
 Worsening. Not drinking enough at home. Recommend to increase fluid intake. 60-70 oz per day

## 2023-06-30 NOTE — Progress Notes (Signed)
 [facility] OFFICE PROGRESS NOTE  Patient Care Team: Darnelle Elders, [name] as PCP - General (Family Medicine) Pickenpack-Cousar, Giles Labrum, NP as Nurse Practitioner [facility] and Palliative Medicine)  Diagnosis: 11/25/2022. Diffuse liver, bone, lung metastases. Brain metastases after starting systemic treatment.  Rapidly rising liver enzymes, LDH with profound disease burden. Treatment: 12/11/2022. First line ipi/nivo. Completed 2 cycles.  Admitted for worsening LFT, large amount of metastases in the liver. 12/14/22-12/28/22 radiation to L-spine 30Gy/71fx Needed rapid response due to systemic disease burden 01/09/23  vemurafenib  and cobimetinib due to disease uncontrolled or worsening symptoms, liver metastases. 03/24/23 switched to Braftovi  and Mektovi  due to suboptimal CNS response.   Clinically was doing better and able to go to a cruise with husband Tim.   Discussed progression of liver and bone metastases. Final read pending. Clinically report sleeping and tired a bit. Pending MRI of brain this week. Unfortunately her cancer is now resistant to BRAF/MEK inhibitors.  Discussed GOC. Discussed no a lot of data showing long term efficacy at this point. Discussed relatlimab/nivo in patient progressed on anti-PD1 therapy. PFS at 6 months was in the 20% range. More than 50% of patients progressed in 2-4 months. Discussed potential SE and toxicities. Discussed likelihood of disease progression and may end up in the ED and hospital if significant end organ damage from her cancer.   Discussed hospice care with progression as an option. Focus on comfort measure at home. She has discussed DNR with her husband. I agree with this.  After discussion, we will tentatively plan to proceed with nivo/rela and she will let us  know if she changes her plan. If continue to decline, hospice is reasonable.  Renal insufficiency. Will increase fluid at home.   Intermittent nausea continue supportive  measures as below. Assessment & Plan Melanoma metastatic to liver [facility]) Disease progression. Continue encorafenib   450 mg once daily with binimetinib  45 mg twice daily until switch to nivo/rela CBC, CMP, LDH every visit with follow up MRI of brain this week  Renal insufficiency Worsening. Not drinking enough at home. Recommend to increase fluid intake. 60-70 oz per day Hypokalemia Potassium chloride  20 mEq twice daily.  Refilled 10 mEq tabs Decreased appetite continue Marinol    I spent a total of 100 minutes including review of chart and various tests results, face-to-face time with the patient, husband, discussions about results, plan of care and coordination of care, goals of care, prognosis, treatment plan, efficacy and side effects.  Orders Placed This Encounter  Procedures   Consent Attestation for Oncology Treatment    The patient is informed of risks, benefits, side-effects of the prescribed oncology treatment. Potential short term and long term side effects and response rates discussed. After a long discussion, the patient made informed decision to proceed.:   Yes   T4, free    Standing Status:   Future    Expected Date:   07/08/2023    Expiration Date:   07/07/2024   TSH    Standing Status:   Future    Expected Date:   07/08/2023    Expiration Date:   07/07/2024   CBC with Differential (Cancer Center Only)    Standing Status:   Future    Expected Date:   07/08/2023    Expiration Date:   07/07/2024   CMP (Cancer Center only)    Standing Status:   Future    Expected Date:   07/08/2023    Expiration Date:   07/07/2024   CBC with  Differential (Cancer Center Only)    Standing Status:   Future    Expected Date:   08/05/2023    Expiration Date:   08/04/2024   CMP (Cancer Center only)    Standing Status:   Future    Expected Date:   08/05/2023    Expiration Date:   08/04/2024   PHYSICIAN COMMUNICATION ORDER    Thyroid  function tests at baseline and every 3rd cycle   ONCBCN  PHYSICIAN COMMUNICATION 1    The recommended dosage of nivolumab -relatlimab for patients who weigh less than 40 kg has not been established.     [name], MD  INTERVAL HISTORY: Ary returns for treatment follow-up. Able to enjoy her cruise. Report of sitting and sleeping more. Not taking enough fluid. No abd pain, coughing, short of breath. No side effects from last infusion. No new or worsening headache.  Oncology History  Melanoma metastatic to liver [name])  11/16/2022 Imaging   She presented to the emergency room on 11/16/2022 for back pain x 2 weeks and central chest pain for several months   CTA CAP IMPRESSION: 1. Negative for aortic dissection or aneurysm. 2. 1.2 cm left upper lobe nodule.  For 3. Multiple ill-defined liver lesions, suspicious for metastatic disease. 4. Multiple lytic osseous lesions, suspicious for metastatic disease. Pathologic fracture of the superior endplate of L5   11/25/2022 Pathology Results   1. Liver, needle/core biopsy, Left hepatic lobe lesion :       - METASTATIC MELANOMA.    12/04/2022 Initial Diagnosis   Melanoma of skin (HCC) Liver, bone metastases. 1.2 cm LUL nodule   12/07/2022 Cancer Staging   Staging form: Melanoma of the Skin, AJCC 8th Edition - Clinical: Stage IV (cTX, cN1, pM1d(0)) - Signed by [name], MD on 03/01/2023   12/09/2022 PET scan   PET: Diffuse bone and liver metastases.  Lung metastases.    12/11/2022 - 06/10/2023 Chemotherapy   Patient is on Treatment Plan : MELANOMA Nivolumab  (1) + Ipilimumab  (3) q21d / Nivolumab  (480) q28d     12/17/2022 Imaging   MRI brain 1. Three small right hemisphere brain metastases metastases ranging from 2 mm to 4 mm. Minimal hemosiderin. No associated cerebral edema or mass effect. No other metastatic disease identified in the brain.   2. Known osseous metastatic disease but no destructive lesion identified in the skull or visible cervical spine.   3. Moderate for age  cerebral white matter changes most commonly due to small vessel disease.   12/17/2022 Imaging   MRI brain 1. Three small right hemisphere brain metastases metastases ranging from 2 mm to 4 mm. Minimal hemosiderin. No associated cerebral edema or mass effect. No other metastatic disease identified in the brain.   2. Known osseous metastatic disease but no destructive lesion identified in the skull or visible cervical spine.   3. Moderate for age cerebral white matter changes most commonly due to small vessel disease.   02/15/2023 PET scan   PET whole body  1. The degree metastatic disease defined on recent CT scan is less impressive on FDG PET scan. 2. There is intense hypermetabolic lesion in the lateral segment of the LEFT hepatic lobe which corresponds to a large 3.7 cm mass. Additional small hepatic lesions have mild metabolic activity. 3. Very low metabolic activity associated with a LEFT upper lobe pulmonary nodule which is decreased in size. Favor positive response to therapy. 4. Small LEFT hilar hypermetabolic node is indeterminate. 5. Widespread lytic lesions  throughout the axillary appendicular skeleton. Majority these lesions do not have hypermetabolic activity. One soft tissue lesion in the LEFT sacral ala does have mild metabolic activity.   07/08/2023 -  Chemotherapy   Patient is on Treatment Plan : MELANOMA Nivolumab -Relatlimab (480/160) D1 q28d        PHYSICAL EXAMINATION: ECOG PERFORMANCE STATUS: 2 - Symptomatic, <50% confined to bed  Vitals:   06/30/23 1616  BP: (!) 110/94  Pulse: (!) 124  Resp: 18  Temp: 97.7 F (36.5 C)  SpO2: 100%   Filed Weights   06/30/23 1616  Weight: 97 lb 8 oz (44.2 kg)   GENERAL: alert, no distress and comfortable SKIN: skin color normal and no jaundice on exposed skin EYES: normal, sclera clear OROPHARYNX: no exudate  NECK: No palpable mass LYMPH:  no palpable cervical, axillary lymphadenopathy  LUNGS: clear to  auscultation with normal breathing effort ABDOMEN: abdomen soft, non-tender and nondistended. Musculoskeletal: no edema NEURO: no focal motor/sensory deficits   Relevant data reviewed during this visit included labs. I personally reviewed her imaging.

## 2023-06-30 NOTE — Assessment & Plan Note (Addendum)
 continue Marinol 

## 2023-06-30 NOTE — Assessment & Plan Note (Addendum)
 Disease progression. Continue encorafenib   450 mg once daily with binimetinib  45 mg twice daily until switch to nivo/rela CBC, CMP, LDH every visit with follow up MRI of brain this week

## 2023-06-30 NOTE — Progress Notes (Signed)
 DISCONTINUE ON PATHWAY REGIMEN - Melanoma and Other Skin Cancers     Cycles 1 through 4: A cycle is every 21 days:     Nivolumab       Ipilimumab     Cycles 5 and beyond: A cycle is every 28 days:     Nivolumab    **Always confirm dose/schedule in your pharmacy ordering system**  PRIOR TREATMENT: [id number]: Nivolumab  1 mg/kg + Ipilimumab  3 mg/kg q21 Days x 4 Doses Followed by Nivolumab  480 mg q28 Days Until Progression, Unacceptable Toxicity, or for up to 2 Years Based on Response  START ON PATHWAY REGIMEN - Melanoma and Other Skin Cancers     A cycle is every 28 days:     Nivolumab  and relatlimab-rmbw   **Always confirm dose/schedule in your pharmacy ordering system**  Patient Characteristics: Melanoma, Cutaneous/Unknown Primary, Distant Metastases, Unresectable, Brain Metastases, BRAF V600 Activating Mutation Positive, Asymptomatic, Treated, and Stable Brain Metastases, Second Line and Beyond, Not a Candidate for OR Not Ready to Proceed With  Tumor-Infiltrating Lymphocyte (TIL) Cell Therapy and Candidate for Immunotherapy Disease Classification: Melanoma Disease Subtype: Cutaneous BRAF V600 Mutation Status: BRAF V600 Activating Mutation Positive Therapeutic Status: Distant Metastases Line of therapy: Second Line and Beyond Intent of Therapy: Non-Curative / Palliative Intent, Discussed with Patient

## 2023-06-30 NOTE — Telephone Encounter (Signed)
 Pt's husband requested 10 meq tablets because they are smaller and easier to swallow. Rx sent for potassium 10 meq two PO twice a day

## 2023-07-01 ENCOUNTER — Ambulatory Visit (HOSPITAL_COMMUNITY): Payer: Self-pay | Admitting: Physician Assistant

## 2023-07-01 ENCOUNTER — Ambulatory Visit (HOSPITAL_COMMUNITY)
Admission: RE | Admit: 2023-07-01 | Discharge: 2023-07-01 | Disposition: A | Discharge status: Home / Self Care | Attending: Diagnostic Radiology | Admitting: Diagnostic Radiology

## 2023-07-01 ENCOUNTER — Other Ambulatory Visit: Payer: Self-pay

## 2023-07-01 ENCOUNTER — Encounter (HOSPITAL_COMMUNITY)
Admission: RE | Disposition: A | Discharge status: Home / Self Care | Payer: Self-pay | Source: Home / Self Care | Attending: Diagnostic Radiology

## 2023-07-01 ENCOUNTER — Encounter (HOSPITAL_COMMUNITY): Payer: Self-pay | Admitting: Diagnostic Radiology

## 2023-07-01 ENCOUNTER — Ambulatory Visit (HOSPITAL_COMMUNITY)
Admission: RE | Admit: 2023-07-01 | Discharge: 2023-07-01 | Disposition: A | Discharge status: Home / Self Care | Source: Ambulatory Visit

## 2023-07-01 DIAGNOSIS — C7931 Secondary malignant neoplasm of brain: Secondary | ICD-10-CM | POA: Diagnosis not present

## 2023-07-01 DIAGNOSIS — N289 Disorder of kidney and ureter, unspecified: Secondary | ICD-10-CM

## 2023-07-01 DIAGNOSIS — Z85820 Personal history of malignant melanoma of skin: Secondary | ICD-10-CM | POA: Diagnosis not present

## 2023-07-01 DIAGNOSIS — Z87891 Personal history of nicotine dependence: Secondary | ICD-10-CM | POA: Diagnosis not present

## 2023-07-01 DIAGNOSIS — E860 Dehydration: Secondary | ICD-10-CM

## 2023-07-01 DIAGNOSIS — F419 Anxiety disorder, unspecified: Secondary | ICD-10-CM | POA: Diagnosis not present

## 2023-07-01 DIAGNOSIS — K219 Gastro-esophageal reflux disease without esophagitis: Secondary | ICD-10-CM | POA: Insufficient documentation

## 2023-07-01 DIAGNOSIS — C787 Secondary malignant neoplasm of liver and intrahepatic bile duct: Secondary | ICD-10-CM

## 2023-07-01 DIAGNOSIS — G96198 Other disorders of meninges, not elsewhere classified: Secondary | ICD-10-CM | POA: Diagnosis not present

## 2023-07-01 DIAGNOSIS — R0602 Shortness of breath: Secondary | ICD-10-CM | POA: Diagnosis not present

## 2023-07-01 DIAGNOSIS — M797 Fibromyalgia: Secondary | ICD-10-CM | POA: Diagnosis not present

## 2023-07-01 DIAGNOSIS — C439 Malignant melanoma of skin, unspecified: Secondary | ICD-10-CM | POA: Diagnosis not present

## 2023-07-01 DIAGNOSIS — C799 Secondary malignant neoplasm of unspecified site: Secondary | ICD-10-CM | POA: Diagnosis not present

## 2023-07-01 HISTORY — PX: RADIOLOGY WITH ANESTHESIA: SHX6223

## 2023-07-01 HISTORY — DX: Malignant neoplasm of liver, not specified as primary or secondary: C22.9

## 2023-07-01 LAB — COMPREHENSIVE METABOLIC PANEL WITH GFR
ALT: 29 U/L (ref 0–44)
AST: 73 U/L — ABNORMAL HIGH (ref 15–41)
Albumin: 2.2 g/dL — ABNORMAL LOW (ref 3.5–5.0)
Alkaline Phosphatase: 214 U/L — ABNORMAL HIGH (ref 38–126)
Anion gap: 11 (ref 5–15)
BUN: 17 mg/dL (ref 6–20)
CO2: 20 mmol/L — ABNORMAL LOW (ref 22–32)
Calcium: 8.9 mg/dL (ref 8.9–10.3)
Chloride: 105 mmol/L (ref 98–111)
Creatinine, Ser: 1.79 mg/dL — ABNORMAL HIGH (ref 0.44–1.00)
GFR, Estimated: 33 mL/min — ABNORMAL LOW (ref >60)
Glucose, Bld: 88 mg/dL (ref 70–99)
Potassium: 3.6 mmol/L (ref 3.5–5.1)
Sodium: 136 mmol/L (ref 135–145)
Total Bilirubin: 1 mg/dL (ref 0.0–1.2)
Total Protein: 5.3 g/dL — ABNORMAL LOW (ref 6.5–8.1)

## 2023-07-01 SURGERY — MRI WITH ANESTHESIA
Anesthesia: General

## 2023-07-01 MED ORDER — GADOBUTROL 1 MMOL/ML IV SOLN
5.0000 mL | Freq: Once | INTRAVENOUS | Status: AC | PRN
  Administered 2023-07-01: 5 mL via INTRAVENOUS

## 2023-07-01 MED ORDER — PHENYLEPHRINE 80 MCG/ML (10ML) SYRINGE FOR IV PUSH (FOR BLOOD PRESSURE SUPPORT)
PREFILLED_SYRINGE | INTRAVENOUS | Status: DC | PRN
  Administered 2023-07-01 (×2): 80 ug via INTRAVENOUS

## 2023-07-01 MED ORDER — ORAL CARE MOUTH RINSE: 15.0000 mL | Freq: Once | OROMUCOSAL | Status: AC

## 2023-07-01 MED ORDER — LACTATED RINGERS IV SOLN: INTRAVENOUS | Status: DC

## 2023-07-01 MED ORDER — EPHEDRINE SULFATE-NACL 50-0.9 MG/10ML-% IV SOSY
PREFILLED_SYRINGE | INTRAVENOUS | Status: DC | PRN
  Administered 2023-07-01: 10 mg via INTRAVENOUS

## 2023-07-01 MED ORDER — PROPOFOL 10 MG/ML IV BOLUS
INTRAVENOUS | Status: DC | PRN
  Administered 2023-07-01: 50 mg via INTRAVENOUS

## 2023-07-01 MED ORDER — ONDANSETRON HCL 4 MG/2ML IJ SOLN
INTRAMUSCULAR | Status: DC | PRN
  Administered 2023-07-01: 4 mg via INTRAVENOUS

## 2023-07-01 MED ORDER — CHLORHEXIDINE GLUCONATE 0.12 % MT SOLN
15.0000 mL | Freq: Once | OROMUCOSAL | Status: AC
  Administered 2023-07-01: 15 mL via OROMUCOSAL
  Filled 2023-07-01: qty 15

## 2023-07-01 MED ORDER — LIDOCAINE 2% (20 MG/ML) 5 ML SYRINGE
INTRAMUSCULAR | Status: DC | PRN
  Administered 2023-07-01: 100 mg via INTRAVENOUS

## 2023-07-01 NOTE — Transfer of Care (Signed)
 Immediate Anesthesia Transfer of Care Note  Patient: Laura [name]  Procedure(s) Performed: MRI WITH ANESTHESIA  Patient Location: PACU  Anesthesia Type:General  Level of Consciousness: awake, alert , and oriented  Airway & Oxygen  Therapy: Patient Spontanous Breathing and Patient connected to nasal cannula oxygen   Post-op Assessment: Report given to RN and Post -op Vital signs reviewed and stable  Post vital signs: Reviewed and stable  Last Vitals:  Vitals Value Taken Time  BP 106/74 07/01/23 1004  Temp 36.9 C 07/01/23 1004  Pulse 110 07/01/23 1014  Resp 12 07/01/23 1014  SpO2 98 % 07/01/23 1014  Vitals shown include unfiled device data.  Last Pain:  Vitals:   07/01/23 1004  TempSrc:   PainSc: 0-No pain         Complications: No notable events documented.

## 2023-07-01 NOTE — Anesthesia Preprocedure Evaluation (Signed)
 Anesthesia Evaluation  Patient identified by MRN, date of birth, ID band Patient awake    Reviewed: Allergy & Precautions, NPO status , Patient's Chart, lab work & pertinent test results  History of Anesthesia Complications (+) PONV and history of anesthetic complications  Airway Mallampati: II  TM Distance: >3 FB Neck ROM: Full    Dental  (+) Dental Advisory Given, Teeth Intact,    Pulmonary neg shortness of breath, neg sleep apnea, neg recent URI, former smoker   breath sounds clear to auscultation       Cardiovascular negative cardio ROS  Rhythm:Regular     Neuro/Psych  Headaches PSYCHIATRIC DISORDERS Anxiety      Neuromuscular disease    GI/Hepatic Neg liver ROS,GERD  Controlled,,  Endo/Other  negative endocrine ROS    Renal/GU Renal InsufficiencyRenal diseaseLab Results      Component                Value               Date                      NA                       136                 07/01/2023                K                        3.6                 07/01/2023                CO2                      20 (L)              07/01/2023                GLUCOSE                  88                  07/01/2023                BUN                      17                  07/01/2023                CREATININE               1.79 (H)            07/01/2023                CALCIUM                   8.9                 07/01/2023                GFRNONAA                 33 (L)  07/01/2023                Musculoskeletal  (+) Arthritis ,  Fibromyalgia -  Abdominal   Peds  Hematology  (+) Blood dyscrasia, anemia Lab Results      Component                Value               Date                      WBC                      8.5                 06/30/2023                HGB                      10.9 (L)            06/30/2023                HCT                      33.4 (L)            06/30/2023                MCV                       87.4                06/30/2023                PLT                      442 (H)             06/30/2023              Anesthesia Other Findings melanoma  Reproductive/Obstetrics                              Anesthesia Physical Anesthesia Plan  ASA: 4  Anesthesia Plan: General   Post-op Pain Management: Minimal or no pain anticipated   Induction: Intravenous  PONV Risk Score and Plan: 4 or greater and Ondansetron  and Dexamethasone   Airway Management Planned: LMA  Additional Equipment: None  Intra-op Plan:   Post-operative Plan: Extubation in OR  Informed Consent: I have reviewed the patients History and Physical, chart, labs and discussed the procedure including the risks, benefits and alternatives for the proposed anesthesia with the patient or authorized representative who has indicated his/her understanding and acceptance.     Dental advisory given  Plan Discussed with: CRNA  Anesthesia Plan Comments:          Anesthesia Quick Evaluation

## 2023-07-01 NOTE — Anesthesia Procedure Notes (Signed)
 Procedure Name: LMA Insertion Date/Time: 07/01/2023 8:56 AM  Performed by: Emmitt Harp, CRNAPre-anesthesia Checklist: Patient identified, Emergency Drugs available, Suction available and Patient being monitored Patient Re-evaluated:Patient Re-evaluated prior to induction Oxygen  Delivery Method: Circle System Utilized Preoxygenation: Pre-oxygenation with 100% oxygen  Induction Type: IV induction Ventilation: Mask ventilation without difficulty LMA: LMA inserted LMA Size: 3.0 Number of attempts: 1 Airway Equipment and Method: Bite block Placement Confirmation: positive ETCO2 Tube secured with: Tape Dental Injury: Teeth and Oropharynx as per pre-operative assessment

## 2023-07-02 ENCOUNTER — Encounter (HOSPITAL_COMMUNITY): Payer: Self-pay | Admitting: Radiology

## 2023-07-03 ENCOUNTER — Inpatient Hospital Stay

## 2023-07-03 ENCOUNTER — Other Ambulatory Visit (HOSPITAL_COMMUNITY): Payer: Self-pay

## 2023-07-03 VITALS — BP 97/67 | HR 129 | Temp 97.9°F | Resp 18

## 2023-07-03 DIAGNOSIS — C7800 Secondary malignant neoplasm of unspecified lung: Secondary | ICD-10-CM | POA: Diagnosis not present

## 2023-07-03 DIAGNOSIS — C787 Secondary malignant neoplasm of liver and intrahepatic bile duct: Secondary | ICD-10-CM | POA: Diagnosis not present

## 2023-07-03 DIAGNOSIS — E876 Hypokalemia: Secondary | ICD-10-CM | POA: Diagnosis not present

## 2023-07-03 DIAGNOSIS — C7931 Secondary malignant neoplasm of brain: Secondary | ICD-10-CM | POA: Diagnosis not present

## 2023-07-03 DIAGNOSIS — C439 Malignant melanoma of skin, unspecified: Secondary | ICD-10-CM | POA: Diagnosis not present

## 2023-07-03 DIAGNOSIS — E860 Dehydration: Secondary | ICD-10-CM

## 2023-07-03 DIAGNOSIS — Z515 Encounter for palliative care: Secondary | ICD-10-CM | POA: Diagnosis not present

## 2023-07-03 DIAGNOSIS — R1310 Dysphagia, unspecified: Secondary | ICD-10-CM | POA: Diagnosis not present

## 2023-07-03 DIAGNOSIS — N289 Disorder of kidney and ureter, unspecified: Secondary | ICD-10-CM | POA: Diagnosis not present

## 2023-07-03 DIAGNOSIS — C7951 Secondary malignant neoplasm of bone: Secondary | ICD-10-CM | POA: Diagnosis not present

## 2023-07-03 DIAGNOSIS — R5383 Other fatigue: Secondary | ICD-10-CM | POA: Diagnosis not present

## 2023-07-03 MED ORDER — SODIUM CHLORIDE 0.9 % IV SOLN: INTRAVENOUS | Status: AC

## 2023-07-03 MED ORDER — HEPARIN SOD (PORK) LOCK FLUSH 100 UNIT/ML IV SOLN
500.0000 [IU] | Freq: Once | INTRAVENOUS | Status: AC | PRN
  Administered 2023-07-03: 500 [IU]

## 2023-07-03 MED ORDER — SODIUM CHLORIDE 0.9% FLUSH
10.0000 mL | Freq: Once | INTRAVENOUS | Status: AC | PRN
  Administered 2023-07-03: 10 mL

## 2023-07-05 ENCOUNTER — Other Ambulatory Visit: Payer: Self-pay | Admitting: Nurse Practitioner

## 2023-07-05 ENCOUNTER — Telehealth: Payer: Self-pay

## 2023-07-05 ENCOUNTER — Other Ambulatory Visit (HOSPITAL_COMMUNITY): Payer: Self-pay

## 2023-07-05 ENCOUNTER — Other Ambulatory Visit: Payer: Self-pay

## 2023-07-05 DIAGNOSIS — C787 Secondary malignant neoplasm of liver and intrahepatic bile duct: Secondary | ICD-10-CM

## 2023-07-05 DIAGNOSIS — C7931 Secondary malignant neoplasm of brain: Secondary | ICD-10-CM

## 2023-07-05 DIAGNOSIS — G893 Neoplasm related pain (acute) (chronic): Secondary | ICD-10-CM

## 2023-07-05 DIAGNOSIS — Z515 Encounter for palliative care: Secondary | ICD-10-CM

## 2023-07-05 DIAGNOSIS — R630 Anorexia: Secondary | ICD-10-CM

## 2023-07-05 DIAGNOSIS — R110 Nausea: Secondary | ICD-10-CM

## 2023-07-05 DIAGNOSIS — C7951 Secondary malignant neoplasm of bone: Secondary | ICD-10-CM

## 2023-07-05 MED ORDER — OLANZAPINE 2.5 MG PO TABS
2.5000 mg | ORAL_TABLET | Freq: Every day | ORAL | 3 refills | Status: DC
  Filled 2023-07-05 – 2023-07-09 (×2): qty 30, 30d supply, fill #0

## 2023-07-05 MED ORDER — DRONABINOL 10 MG PO CAPS
10.0000 mg | ORAL_CAPSULE | Freq: Two times a day (BID) | ORAL | 2 refills | Status: DC
  Filled 2023-07-05: qty 60, 30d supply, fill #0

## 2023-07-05 MED ORDER — TRAMADOL HCL 50 MG PO TABS
50.0000 mg | ORAL_TABLET | Freq: Four times a day (QID) | ORAL | 0 refills | Status: DC | PRN
  Filled 2023-07-05: qty 90, 12d supply, fill #0

## 2023-07-05 NOTE — Telephone Encounter (Signed)
 Notified the pt son regarding his FMLA being completed,faxed, and confirmation received. Pt hardcopy has been faxed to his email as requested . No questions or concerns at this time.

## 2023-07-06 ENCOUNTER — Telehealth: Payer: Self-pay

## 2023-07-06 ENCOUNTER — Ambulatory Visit: Payer: Self-pay

## 2023-07-06 ENCOUNTER — Other Ambulatory Visit (HOSPITAL_COMMUNITY): Payer: Self-pay

## 2023-07-06 NOTE — Telephone Encounter (Signed)
 Called and spoke to patient and husband regarding PET and MRI findings. Discussed LM metastases and profound liver progression. They had discussed since last visit and elect to proceed with hospice at home. However, she would like to keep appointment for IVF on Thursday. Will keep appointment with me and Palliative care.   Ok to cancel lab.   Please use infusion on one Liter of Normal saline. Treatment plan discontinued.

## 2023-07-06 NOTE — Anesthesia Postprocedure Evaluation (Signed)
 Anesthesia Post Note  Patient: Laura [name]  Procedure(s) Performed: MRI WITH ANESTHESIA     Patient location during evaluation: PACU Anesthesia Type: General Level of consciousness: awake and alert Pain management: pain level controlled Vital Signs Assessment: post-procedure vital signs reviewed and stable Respiratory status: spontaneous breathing, nonlabored ventilation and respiratory function stable Cardiovascular status: blood pressure returned to baseline and stable Postop Assessment: no apparent nausea or vomiting Anesthetic complications: no   No notable events documented.                  Almetta Liddicoat

## 2023-07-06 NOTE — Telephone Encounter (Signed)
 TC to Radiology reading room to request expedited results for pt's scans done on 6/2 and 6/5.

## 2023-07-08 ENCOUNTER — Inpatient Hospital Stay

## 2023-07-08 ENCOUNTER — Encounter: Payer: Self-pay | Admitting: Nurse Practitioner

## 2023-07-08 ENCOUNTER — Inpatient Hospital Stay (HOSPITAL_BASED_OUTPATIENT_CLINIC_OR_DEPARTMENT_OTHER): Admitting: Nurse Practitioner

## 2023-07-08 ENCOUNTER — Encounter: Admitting: Dietician

## 2023-07-08 ENCOUNTER — Inpatient Hospital Stay (HOSPITAL_BASED_OUTPATIENT_CLINIC_OR_DEPARTMENT_OTHER)

## 2023-07-08 VITALS — BP 107/81 | HR 147 | Temp 98.1°F | Resp 20 | Wt 95.3 lb

## 2023-07-08 DIAGNOSIS — R630 Anorexia: Secondary | ICD-10-CM

## 2023-07-08 DIAGNOSIS — E785 Hyperlipidemia, unspecified: Secondary | ICD-10-CM | POA: Diagnosis not present

## 2023-07-08 DIAGNOSIS — R530 Neoplastic (malignant) related fatigue: Secondary | ICD-10-CM

## 2023-07-08 DIAGNOSIS — Z7189 Other specified counseling: Secondary | ICD-10-CM | POA: Diagnosis not present

## 2023-07-08 DIAGNOSIS — I10 Essential (primary) hypertension: Secondary | ICD-10-CM | POA: Diagnosis not present

## 2023-07-08 DIAGNOSIS — G893 Neoplasm related pain (acute) (chronic): Secondary | ICD-10-CM | POA: Diagnosis not present

## 2023-07-08 DIAGNOSIS — Z515 Encounter for palliative care: Secondary | ICD-10-CM

## 2023-07-08 DIAGNOSIS — E860 Dehydration: Secondary | ICD-10-CM

## 2023-07-08 DIAGNOSIS — E46 Unspecified protein-calorie malnutrition: Secondary | ICD-10-CM | POA: Diagnosis not present

## 2023-07-08 DIAGNOSIS — C7800 Secondary malignant neoplasm of unspecified lung: Secondary | ICD-10-CM | POA: Diagnosis not present

## 2023-07-08 DIAGNOSIS — N289 Disorder of kidney and ureter, unspecified: Secondary | ICD-10-CM | POA: Diagnosis not present

## 2023-07-08 DIAGNOSIS — C439 Malignant melanoma of skin, unspecified: Secondary | ICD-10-CM | POA: Diagnosis not present

## 2023-07-08 DIAGNOSIS — J309 Allergic rhinitis, unspecified: Secondary | ICD-10-CM | POA: Diagnosis not present

## 2023-07-08 DIAGNOSIS — C787 Secondary malignant neoplasm of liver and intrahepatic bile duct: Secondary | ICD-10-CM

## 2023-07-08 DIAGNOSIS — C7931 Secondary malignant neoplasm of brain: Secondary | ICD-10-CM | POA: Diagnosis not present

## 2023-07-08 DIAGNOSIS — R634 Abnormal weight loss: Secondary | ICD-10-CM

## 2023-07-08 DIAGNOSIS — R1310 Dysphagia, unspecified: Secondary | ICD-10-CM | POA: Diagnosis not present

## 2023-07-08 DIAGNOSIS — K219 Gastro-esophageal reflux disease without esophagitis: Secondary | ICD-10-CM | POA: Diagnosis not present

## 2023-07-08 DIAGNOSIS — M1990 Unspecified osteoarthritis, unspecified site: Secondary | ICD-10-CM | POA: Diagnosis not present

## 2023-07-08 DIAGNOSIS — C7951 Secondary malignant neoplasm of bone: Secondary | ICD-10-CM | POA: Diagnosis not present

## 2023-07-08 DIAGNOSIS — E876 Hypokalemia: Secondary | ICD-10-CM | POA: Diagnosis not present

## 2023-07-08 DIAGNOSIS — R5383 Other fatigue: Secondary | ICD-10-CM | POA: Diagnosis not present

## 2023-07-08 MED ORDER — SODIUM CHLORIDE 0.9 % IV SOLN: INTRAVENOUS | Status: AC

## 2023-07-08 MED ORDER — SODIUM CHLORIDE 0.9% FLUSH
10.0000 mL | Freq: Once | INTRAVENOUS | Status: AC | PRN
  Administered 2023-07-08: 10 mL

## 2023-07-08 NOTE — Assessment & Plan Note (Addendum)
 Will proceed with home hospice.

## 2023-07-08 NOTE — Progress Notes (Signed)
 [facility] OFFICE PROGRESS NOTE  Patient Care Team: Darnelle Elders, [name] as PCP - General (Family Medicine) Pickenpack-Cousar, Giles Labrum, NP as Nurse Practitioner [facility] and Palliative Medicine)  Laura [name] is a 58 y.o.female currently undergoing active treatment for stage IV melanoma. New imaging showed both systemic and CNS progression, now with leptomeningeal metastases and progressive liver metastases.  Discussed progression from imaging. Last week I saw her and this week she declined a lot clinically. Son reports more sleeping times. Discussed she likely to succumb to her disease in the near future. They have discussed and would like home hospice. This is reasonable. Palliative care to follow up today and hospice referral.  Assessment & Plan Melanoma metastatic to liver [facility]) Disease progression. Recommend Hospice  Goals of care, counseling/discussion Will proceed with home hospice.  [name], MD  INTERVAL HISTORY: Patient returns for follow-up. Son and husband with her. On wheelchair. Report a fall earlier. Son report sleeping more. No headache. She feels weak.  Oncology History  Melanoma metastatic to liver [facility])  11/16/2022 Imaging   She presented to the emergency room on 11/16/2022 for back pain x 2 weeks and central chest pain for several months   CTA CAP IMPRESSION: 1. Negative for aortic dissection or aneurysm. 2. 1.2 cm left upper lobe nodule.  For 3. Multiple ill-defined liver lesions, suspicious for metastatic disease. 4. Multiple lytic osseous lesions, suspicious for metastatic disease. Pathologic fracture of the superior endplate of L5   11/25/2022 Pathology Results   1. Liver, needle/core biopsy, Left hepatic lobe lesion :       - METASTATIC MELANOMA.    12/04/2022 Initial Diagnosis   Melanoma of skin (HCC) Liver, bone metastases. 1.2 cm LUL nodule   12/07/2022 Cancer Staging   Staging form: Melanoma of the Skin, AJCC 8th Edition -  Clinical: Stage IV (cTX, cN1, pM1d(0)) - Signed by [name], MD on 03/01/2023   12/09/2022 PET scan   PET: Diffuse bone and liver metastases.  Lung metastases.    12/11/2022 - 06/10/2023 Chemotherapy   Patient is on Treatment Plan : MELANOMA Nivolumab  (1) + Ipilimumab  (3) q21d / Nivolumab  (480) q28d     12/17/2022 Imaging   MRI brain 1. Three small right hemisphere brain metastases metastases ranging from 2 mm to 4 mm. Minimal hemosiderin. No associated cerebral edema or mass effect. No other metastatic disease identified in the brain.   2. Known osseous metastatic disease but no destructive lesion identified in the skull or visible cervical spine.   3. Moderate for age cerebral white matter changes most commonly due to small vessel disease.   12/17/2022 Imaging   MRI brain 1. Three small right hemisphere brain metastases metastases ranging from 2 mm to 4 mm. Minimal hemosiderin. No associated cerebral edema or mass effect. No other metastatic disease identified in the brain.   2. Known osseous metastatic disease but no destructive lesion identified in the skull or visible cervical spine.   3. Moderate for age cerebral white matter changes most commonly due to small vessel disease.   02/15/2023 PET scan   PET whole body  1. The degree metastatic disease defined on recent CT scan is less impressive on FDG PET scan. 2. There is intense hypermetabolic lesion in the lateral segment of the LEFT hepatic lobe which corresponds to a large 3.7 cm mass. Additional small hepatic lesions have mild metabolic activity. 3. Very low metabolic activity associated with a LEFT upper lobe pulmonary nodule which is  decreased in size. Favor positive response to therapy. 4. Small LEFT hilar hypermetabolic node is indeterminate. 5. Widespread lytic lesions throughout the axillary appendicular skeleton. Majority these lesions do not have hypermetabolic activity. One soft tissue lesion in the  LEFT sacral ala does have mild metabolic activity.   07/08/2023 - 07/08/2023 Chemotherapy   Patient is on Treatment Plan : MELANOMA Nivolumab -Relatlimab (480/160) D1 q28d        PHYSICAL EXAMINATION: ECOG PERFORMANCE STATUS: 3 - Symptomatic, >50% confined to bed  Vitals:   07/08/23 0914  BP: 107/81  Pulse: (!) 147  Resp: 20  Temp: 98.1 F (36.7 C)  SpO2: 97%   Filed Weights   07/08/23 0914  Weight: 95 lb 4.8 oz (43.2 kg)   Gen: on wheelchair. Appear weak Pulm: no respiratory distress  Relevant data reviewed during this visit included imaging and labs.

## 2023-07-08 NOTE — Progress Notes (Signed)
 Palliative Medicine [facility]  [phone] [phone]   Name: Laura [name] Date: 07/08/2023 MRN: [id number]  DOB: [birth year]  Patient Care Team: Darnelle Elders, [name] as PCP - General (Family Medicine) Pickenpack-Cousar, Giles Labrum, NP as Nurse Practitioner [facility] and Palliative Medicine)    INTERVAL HISTORY: Laura [name] is a 58 y.o. female with oncologic medical history including malignant melanoma (10/2022) with metastatic disease to the bone. As well as a history of hypertension, hyperlipidemia, GERD, and arthritis. Palliative ask to see for symptom management and goals of care.   SOCIAL HISTORY:     reports that she quit smoking about 7 years ago. Her smoking use included cigarettes. She started smoking about 27 years ago. She has a 20 pack-year smoking history. She has never used smokeless tobacco. She reports that she does not drink alcohol and does not use drugs.  ADVANCE DIRECTIVES:  None on file   CODE STATUS: Full code  PAST MEDICAL HISTORY: Past Medical History:  Diagnosis Date   Anxiety    Arthritis    Complication of anesthesia    Fibromyalgia    Gallstones 04/20/2018   GERD (gastroesophageal reflux disease)    History of blood transfusion    post Hysterectomy   Hypercholesteremia    Liver cancer (HCC)    Melanoma (HCC)    on back and excised   Metastatic melanoma (HCC)    Migraine    PONV (postoperative nausea and vomiting)    Tobacco abuse    Vertebral artery disease (HCC)    history of left vertebral artery dissection 2011    ALLERGIES:  is allergic to aspirin and tylenol  [acetaminophen ].  MEDICATIONS:  Current Outpatient Medications  Medication Sig Dispense Refill   binimetinib  (MEKTOVI ) 15 MG tablet Take 3 tablets (45 mg total) by mouth 2 (two) times daily. 180 tablet 11   Biotin 56387 MCG TABS Take 1,000 mg by mouth daily.     calcium  carbonate (OS-CAL - DOSED IN MG OF ELEMENTAL CALCIUM ) 1250  (500 Ca) MG tablet Take 1 tablet by mouth daily at 2 PM. Pt takes w/ Vitamin D     cetirizine (ZYRTEC) 10 MG tablet Take 10 mg by mouth daily.     cyanocobalamin  (VITAMIN B12) 1000 MCG tablet Take 1,000 mcg by mouth daily.     docusate sodium  (COLACE) 50 MG capsule Take 50 mg by mouth 2 (two) times daily.     dronabinol  (MARINOL ) 10 MG capsule Take 1 capsule (10 mg total) by mouth 2 (two) times daily before a meal. 60 capsule 2   encorafenib  (BRAFTOVI ) 75 MG capsule Take 6 capsules (450 mg total) by mouth daily. 180 capsule 11   famotidine  (PEPCID ) 20 MG tablet Take 20 mg by mouth 2 (two) times daily.     fentaNYL  (DURAGESIC ) 25 MCG/HR Place 1 patch onto the skin every 3 (three) days. 10 patch 0   folic acid  (FOLVITE ) 1 MG tablet Take 1 mg by mouth daily.     gabapentin  (NEURONTIN ) 100 MG capsule Take 1 capsule (100 mg total) by mouth 2 (two) times daily. (Patient taking differently: Take 100 mg by mouth at bedtime.) 60 capsule 0   glycopyrrolate  (ROBINUL ) 1 MG tablet Take 1 tablet (1 mg total) by mouth 2 (two) times daily as needed. 30 tablet 2   hydrOXYzine (ATARAX) 25 MG tablet Take 25 mg by mouth daily as needed for nausea, vomiting or anxiety.     ibuprofen  (ADVIL )  600 MG tablet Take 1 tablet (600 mg total) by mouth every 6 (six) hours as needed for fever, headache, or mild pain. 30 tablet 0   lidocaine -prilocaine  (EMLA ) cream Apply to port site 30-45 min prior to appointment. 30 g 6   OLANZapine  (ZYPREXA ) 2.5 MG tablet Take 1 tablet (2.5 mg total) by mouth at bedtime. 90 tablet 3   ondansetron  (ZOFRAN -ODT) 8 MG disintegrating tablet Dissolve 1 tablet (8 mg total) by mouth every 8 (eight) hours as needed for nausea or vomiting. 60 tablet 2   potassium chloride  (KLOR-CON  M) 10 MEQ tablet Take 2 tablets (20 mEq total) by mouth 2 (two) times daily. 120 tablet 1   prochlorperazine  (COMPAZINE ) 10 MG tablet Take 1 tablet (10 mg total) by mouth every 6 (six) hours as needed for nausea or vomiting.  (Patient taking differently: Take 10 mg by mouth daily.) 30 tablet 2   rosuvastatin  (CRESTOR ) 10 MG tablet Take 10 mg by mouth at bedtime.     scopolamine  (TRANSDERM-SCOP) 1 MG/3DAYS Place 1 patch (1.5 mg total) onto the skin every 3 (three) days. 10 patch 12   traMADol  (ULTRAM ) 50 MG tablet Take 1-2 tablets (50-100 mg total) by mouth every 6 (six) hours as needed for moderate pain (pain score 4-6) or severe pain (pain score 7-10). 90 tablet 0   triamcinolone  ointment (KENALOG ) 0.5 % Apply topically to affected area twice daily (Patient taking differently: Apply 1 Application topically 2 (two) times daily as needed (Itching).) 30 g 0   zolpidem  (AMBIEN  CR) 12.5 MG CR tablet Take 1 tablet (12.5 mg total) by mouth at bedtime as needed for sleep. (Patient taking differently: Take 12.5 mg by mouth at bedtime.) 30 tablet 2   No current facility-administered medications for this visit.    VITAL SIGNS: There were no vitals taken for this visit. There were no vitals filed for this visit.  Estimated body mass index is 17.43 kg/m as calculated from the following:   Height as of 07/01/23: 5' 2 (1.575 m).   Weight as of an earlier encounter on 07/08/23: 95 lb 4.8 oz (43.2 kg).   PERFORMANCE STATUS (ECOG) : 1 - Symptomatic but completely ambulatory  Physical Exam General: Fatigue, thin Cardiovascular: regular rate and rhythm Pulmonary: normal breathing pattern Extremities: no edema, no joint deformities Skin: no rashes Neurological: AAO x3  IMPRESSION: Discussed the use of AI scribe software for clinical note transcription with the patient, who gave verbal consent to proceed.  History of Present Illness Laura [name] is a 58 year old female with metastatic melanoma who presents to clinic for hospice care discussion and management of symptoms. She is accompanied by her son and husband, who is her Management consultant and caregiver. patient is in a wheelchair.  Recently just came back from a 7-day long  cruise vacation which she states she enjoyed and glad I went. Support provided. Denies uncontrolled symptoms of nausea, vomiting, constipation, or diarrhea. Patient reports her nausea has been improved since discontinuing oral chemotherapy. She did require IVF during her cruise. Will receive IVF in office today.   No current issues with breathing or shortness of breath. She has access to oxygen  at home, which is covered by her insurance. However, she is having difficulty staying awake and swallowing. Family has noticed patient is sleeping more. Appetite has been minimal. Current weight is 95lbs.   We discussed her pain at length.  Mrs. Bucaro pain management includes tramadol , two tablets every four hours as needed, and a  25-mcg fentanyl  patch. Gabapentin  100 mg to 3 times daily. Tolerating without difficulty. Pain controlled overall on current regimen. No adjustments at this time.   Patient seen by Dr. Alita Irwin today and due to disease progression and decline in overall performance status it was recommended to consider outpatient hospice support. Patient and family emotional. Support provided. They are in agreement with plans for hospice.   Extensive education provided on outpatient hospice services, what care would look like, inpatient hospice facility, and expectations at end-of-life. Family would like referral place to [facility]. Education provided on referral process. She will need bedside commode/shower chair and bedside table for support.   Goals of Care  Mrs. Egler and her family are clear in their expressed wishes for patient to return home with family support to spend time amongst family for as long as she can.  She wants to remain at home as long as possible. We discussed advanced directives, healthcare wishes, and MOST form. Education provided and patient has requested to complete all documentations. She has a living will indicating her husband as Clinical research associate. Patient and family  confirms wishes for DNR/DNI. MOST form completed.   All questions answered and support provided.  I discussed the importance of continued conversation with family and their medical providers regarding overall plan of care and treatment options, ensuring decisions are within the context of the patients values and GOCs. Assessment & Plan Assessment and Plan Metastatic Melanoma Cancer Recommendations for hospice care with focus on comfort. Discontinuation of cancer medication alleviated nausea. Hospice services to provide outpatient support.  - Referral for hospice care at home. - Ensure comfort measures. - Avoid IV fluids long-term - Manage symptoms with medications.  Difficulty swallowing Difficulty swallowing present, common in advanced cancer. Feeding tube not planned due to surgery requirement and misalignment with hospice goals. - Avoid feeding tube placement.  Fatigue Fatigue related to advanced cancer stage and condition. Focus on comfort and quality of life.  Goals of Care Wishes to remain at home for hospice care. Transfer to inpatient facility for unmanageable symptoms is an option. Advanced directives include DNR and MOST form indicating comfort care focus, allowance for antibiotics, no IV fluids or feeding tube. - Ensure advanced directives are accessible. - Coordinate with hospice for outpatient services. Referral placed to [facility] - Review and confirm MOST form preferences. Completed and original provided to patient.  -DNR/DNI completed Cancer related pain Pain managed with fentanyl  patch and tramadol .   - Fentanyl  patch 25 mcg.  Discussed possibility of increasing to 37 mcg if pain worsens on current regimen. -Gabapentin  100 mg 3 times daily - Continue tramadol  50-100 mg every 4-6 hours as needed.    Nausea and vomiting Intermittent symptoms improved with current management. -Continue famotidine  20 mg twice daily -Robinul  1 mg twice daily as needed -Increase  Olanzapine  5 mg at bedtime -Ondansetron  8 mg every 8 hours as needed -Promethazine  25 mg every 8 hours as needed -Scopolamine  patch 1.5 change every 72 hours  Patient expressed understanding and was in agreement with this plan. She also understands that She can call the clinic at any time with any questions, concerns, or complaints.   Any controlled substances utilized were prescribed in the context of palliative care. PDMP has been reviewed.   I provided 65 minutes of face-to-face during this encounter, and > 50% was spent counseling as documented under my assessment & plan. Visit consisted of counseling and education dealing with the complex and emotionally intense issues of symptom  management and palliative care in the setting of serious and potentially life-threatening illness.  Dellia Ferguson, AGPCNP-BC  Palliative Medicine Team/[facility]

## 2023-07-08 NOTE — Assessment & Plan Note (Addendum)
 Disease progression. Recommend Hospice

## 2023-07-09 ENCOUNTER — Other Ambulatory Visit (HOSPITAL_COMMUNITY): Payer: Self-pay

## 2023-07-09 DIAGNOSIS — C7800 Secondary malignant neoplasm of unspecified lung: Secondary | ICD-10-CM | POA: Diagnosis not present

## 2023-07-09 DIAGNOSIS — M1990 Unspecified osteoarthritis, unspecified site: Secondary | ICD-10-CM | POA: Diagnosis not present

## 2023-07-09 DIAGNOSIS — K219 Gastro-esophageal reflux disease without esophagitis: Secondary | ICD-10-CM | POA: Diagnosis not present

## 2023-07-09 DIAGNOSIS — I10 Essential (primary) hypertension: Secondary | ICD-10-CM | POA: Diagnosis not present

## 2023-07-09 DIAGNOSIS — R634 Abnormal weight loss: Secondary | ICD-10-CM | POA: Diagnosis not present

## 2023-07-09 DIAGNOSIS — C7931 Secondary malignant neoplasm of brain: Secondary | ICD-10-CM | POA: Diagnosis not present

## 2023-07-09 DIAGNOSIS — E46 Unspecified protein-calorie malnutrition: Secondary | ICD-10-CM | POA: Diagnosis not present

## 2023-07-09 DIAGNOSIS — C439 Malignant melanoma of skin, unspecified: Secondary | ICD-10-CM | POA: Diagnosis not present

## 2023-07-09 DIAGNOSIS — C7951 Secondary malignant neoplasm of bone: Secondary | ICD-10-CM | POA: Diagnosis not present

## 2023-07-09 DIAGNOSIS — J309 Allergic rhinitis, unspecified: Secondary | ICD-10-CM | POA: Diagnosis not present

## 2023-07-09 DIAGNOSIS — C787 Secondary malignant neoplasm of liver and intrahepatic bile duct: Secondary | ICD-10-CM | POA: Diagnosis not present

## 2023-07-09 DIAGNOSIS — E785 Hyperlipidemia, unspecified: Secondary | ICD-10-CM | POA: Diagnosis not present

## 2023-07-09 MED ORDER — FENTANYL 25 MCG/HR TD PT72
1.0000 | MEDICATED_PATCH | TRANSDERMAL | 0 refills | Status: DC
  Filled 2023-07-09: qty 5, 15d supply, fill #0

## 2023-07-09 MED ORDER — PREDNISONE 10 MG PO TABS
10.0000 mg | ORAL_TABLET | Freq: Every day | ORAL | 3 refills | Status: DC
  Filled 2023-07-09: qty 30, 30d supply, fill #0

## 2023-07-10 ENCOUNTER — Other Ambulatory Visit (HOSPITAL_COMMUNITY): Payer: Self-pay

## 2023-07-10 DIAGNOSIS — E46 Unspecified protein-calorie malnutrition: Secondary | ICD-10-CM | POA: Diagnosis not present

## 2023-07-10 DIAGNOSIS — K219 Gastro-esophageal reflux disease without esophagitis: Secondary | ICD-10-CM | POA: Diagnosis not present

## 2023-07-10 DIAGNOSIS — I10 Essential (primary) hypertension: Secondary | ICD-10-CM | POA: Diagnosis not present

## 2023-07-10 DIAGNOSIS — C7931 Secondary malignant neoplasm of brain: Secondary | ICD-10-CM | POA: Diagnosis not present

## 2023-07-10 DIAGNOSIS — J309 Allergic rhinitis, unspecified: Secondary | ICD-10-CM | POA: Diagnosis not present

## 2023-07-10 DIAGNOSIS — R634 Abnormal weight loss: Secondary | ICD-10-CM | POA: Diagnosis not present

## 2023-07-10 DIAGNOSIS — C787 Secondary malignant neoplasm of liver and intrahepatic bile duct: Secondary | ICD-10-CM | POA: Diagnosis not present

## 2023-07-10 DIAGNOSIS — E785 Hyperlipidemia, unspecified: Secondary | ICD-10-CM | POA: Diagnosis not present

## 2023-07-10 DIAGNOSIS — M1990 Unspecified osteoarthritis, unspecified site: Secondary | ICD-10-CM | POA: Diagnosis not present

## 2023-07-10 DIAGNOSIS — C7951 Secondary malignant neoplasm of bone: Secondary | ICD-10-CM | POA: Diagnosis not present

## 2023-07-10 DIAGNOSIS — C7800 Secondary malignant neoplasm of unspecified lung: Secondary | ICD-10-CM | POA: Diagnosis not present

## 2023-07-10 DIAGNOSIS — C439 Malignant melanoma of skin, unspecified: Secondary | ICD-10-CM | POA: Diagnosis not present

## 2023-07-10 MED ORDER — OXYCODONE HCL 10 MG PO TABS
5.0000 mg | ORAL_TABLET | Freq: Every evening | ORAL | 0 refills | Status: DC | PRN
  Filled 2023-07-10: qty 30, 30d supply, fill #0

## 2023-07-11 DIAGNOSIS — E785 Hyperlipidemia, unspecified: Secondary | ICD-10-CM | POA: Diagnosis not present

## 2023-07-11 DIAGNOSIS — M1990 Unspecified osteoarthritis, unspecified site: Secondary | ICD-10-CM | POA: Diagnosis not present

## 2023-07-11 DIAGNOSIS — C787 Secondary malignant neoplasm of liver and intrahepatic bile duct: Secondary | ICD-10-CM | POA: Diagnosis not present

## 2023-07-11 DIAGNOSIS — C439 Malignant melanoma of skin, unspecified: Secondary | ICD-10-CM | POA: Diagnosis not present

## 2023-07-11 DIAGNOSIS — K219 Gastro-esophageal reflux disease without esophagitis: Secondary | ICD-10-CM | POA: Diagnosis not present

## 2023-07-11 DIAGNOSIS — J309 Allergic rhinitis, unspecified: Secondary | ICD-10-CM | POA: Diagnosis not present

## 2023-07-11 DIAGNOSIS — C7951 Secondary malignant neoplasm of bone: Secondary | ICD-10-CM | POA: Diagnosis not present

## 2023-07-11 DIAGNOSIS — R634 Abnormal weight loss: Secondary | ICD-10-CM | POA: Diagnosis not present

## 2023-07-11 DIAGNOSIS — C7931 Secondary malignant neoplasm of brain: Secondary | ICD-10-CM | POA: Diagnosis not present

## 2023-07-11 DIAGNOSIS — I10 Essential (primary) hypertension: Secondary | ICD-10-CM | POA: Diagnosis not present

## 2023-07-11 DIAGNOSIS — E46 Unspecified protein-calorie malnutrition: Secondary | ICD-10-CM | POA: Diagnosis not present

## 2023-07-11 DIAGNOSIS — C7800 Secondary malignant neoplasm of unspecified lung: Secondary | ICD-10-CM | POA: Diagnosis not present

## 2023-07-12 ENCOUNTER — Other Ambulatory Visit (HOSPITAL_COMMUNITY): Payer: Self-pay

## 2023-07-27 DEATH — deceased

## 2023-12-20 ENCOUNTER — Encounter: Payer: Self-pay | Admitting: Oncology
